# Patient Record
Sex: Male | Born: 1955
Health system: Southern US, Community
[De-identification: ages and names within clinical notes are randomized; demographics above are authoritative.]

## PROBLEM LIST (undated history)

## (undated) DIAGNOSIS — I700 Atherosclerosis of aorta: Secondary | ICD-10-CM

## (undated) DIAGNOSIS — I739 Peripheral vascular disease, unspecified: Secondary | ICD-10-CM

## (undated) DIAGNOSIS — M1990 Unspecified osteoarthritis, unspecified site: Secondary | ICD-10-CM

## (undated) DIAGNOSIS — E041 Nontoxic single thyroid nodule: Secondary | ICD-10-CM

## (undated) DIAGNOSIS — M5010 Cervical disc disorder with radiculopathy, unspecified cervical region: Secondary | ICD-10-CM

## (undated) DIAGNOSIS — I509 Heart failure, unspecified: Secondary | ICD-10-CM

## (undated) DIAGNOSIS — E538 Deficiency of other specified B group vitamins: Secondary | ICD-10-CM

## (undated) DIAGNOSIS — R053 Chronic cough: Secondary | ICD-10-CM

## (undated) DIAGNOSIS — N200 Calculus of kidney: Secondary | ICD-10-CM

## (undated) DIAGNOSIS — N289 Disorder of kidney and ureter, unspecified: Secondary | ICD-10-CM

## (undated) DIAGNOSIS — I2510 Atherosclerotic heart disease of native coronary artery without angina pectoris: Secondary | ICD-10-CM

## (undated) DIAGNOSIS — E785 Hyperlipidemia, unspecified: Secondary | ICD-10-CM

## (undated) DIAGNOSIS — A048 Other specified bacterial intestinal infections: Secondary | ICD-10-CM

## (undated) DIAGNOSIS — H9190 Unspecified hearing loss, unspecified ear: Secondary | ICD-10-CM

## (undated) DIAGNOSIS — C4490 Unspecified malignant neoplasm of skin, unspecified: Secondary | ICD-10-CM

## (undated) DIAGNOSIS — I6381 Other cerebral infarction due to occlusion or stenosis of small artery: Secondary | ICD-10-CM

## (undated) DIAGNOSIS — M5412 Radiculopathy, cervical region: Secondary | ICD-10-CM

## (undated) DIAGNOSIS — K635 Polyp of colon: Secondary | ICD-10-CM

## (undated) DIAGNOSIS — K219 Gastro-esophageal reflux disease without esophagitis: Secondary | ICD-10-CM

## (undated) DIAGNOSIS — I712 Thoracic aortic aneurysm, without rupture, unspecified: Secondary | ICD-10-CM

## (undated) DIAGNOSIS — M6208 Separation of muscle (nontraumatic), other site: Secondary | ICD-10-CM

## (undated) DIAGNOSIS — G4730 Sleep apnea, unspecified: Secondary | ICD-10-CM

## (undated) DIAGNOSIS — M549 Dorsalgia, unspecified: Secondary | ICD-10-CM

## (undated) DIAGNOSIS — I4819 Other persistent atrial fibrillation: Secondary | ICD-10-CM

## (undated) DIAGNOSIS — I10 Essential (primary) hypertension: Secondary | ICD-10-CM

## (undated) DIAGNOSIS — J449 Chronic obstructive pulmonary disease, unspecified: Secondary | ICD-10-CM

## (undated) DIAGNOSIS — E559 Vitamin D deficiency, unspecified: Secondary | ICD-10-CM

## (undated) DIAGNOSIS — R251 Tremor, unspecified: Secondary | ICD-10-CM

## (undated) DIAGNOSIS — G8929 Other chronic pain: Secondary | ICD-10-CM

## (undated) DIAGNOSIS — G609 Hereditary and idiopathic neuropathy, unspecified: Secondary | ICD-10-CM

## (undated) DIAGNOSIS — E119 Type 2 diabetes mellitus without complications: Secondary | ICD-10-CM

## (undated) DIAGNOSIS — Z8719 Personal history of other diseases of the digestive system: Secondary | ICD-10-CM

## (undated) DIAGNOSIS — G5600 Carpal tunnel syndrome, unspecified upper limb: Secondary | ICD-10-CM

## (undated) DIAGNOSIS — Z77098 Contact with and (suspected) exposure to other hazardous, chiefly nonmedicinal, chemicals: Secondary | ICD-10-CM

## (undated) DIAGNOSIS — R7881 Bacteremia: Secondary | ICD-10-CM

## (undated) DIAGNOSIS — Z87442 Personal history of urinary calculi: Secondary | ICD-10-CM

## (undated) DIAGNOSIS — I219 Acute myocardial infarction, unspecified: Secondary | ICD-10-CM

## (undated) DIAGNOSIS — I4891 Unspecified atrial fibrillation: Secondary | ICD-10-CM

## (undated) DIAGNOSIS — M51379 Other intervertebral disc degeneration, lumbosacral region without mention of lumbar back pain or lower extremity pain: Secondary | ICD-10-CM

## (undated) DIAGNOSIS — I209 Angina pectoris, unspecified: Secondary | ICD-10-CM

## (undated) DIAGNOSIS — M19011 Primary osteoarthritis, right shoulder: Secondary | ICD-10-CM

## (undated) DIAGNOSIS — F32A Depression, unspecified: Secondary | ICD-10-CM

## (undated) DIAGNOSIS — D3500 Benign neoplasm of unspecified adrenal gland: Secondary | ICD-10-CM

## (undated) DIAGNOSIS — I639 Cerebral infarction, unspecified: Secondary | ICD-10-CM

## (undated) DIAGNOSIS — K5790 Diverticulosis of intestine, part unspecified, without perforation or abscess without bleeding: Secondary | ICD-10-CM

## (undated) DIAGNOSIS — G5770 Causalgia of unspecified lower limb: Secondary | ICD-10-CM

## (undated) DIAGNOSIS — I5030 Unspecified diastolic (congestive) heart failure: Secondary | ICD-10-CM

## (undated) DIAGNOSIS — IMO0002 Reserved for concepts with insufficient information to code with codable children: Secondary | ICD-10-CM

## (undated) DIAGNOSIS — Z9889 Other specified postprocedural states: Secondary | ICD-10-CM

## (undated) DIAGNOSIS — R55 Syncope and collapse: Secondary | ICD-10-CM

## (undated) DIAGNOSIS — Z8673 Personal history of transient ischemic attack (TIA), and cerebral infarction without residual deficits: Secondary | ICD-10-CM

## (undated) DIAGNOSIS — F329 Major depressive disorder, single episode, unspecified: Secondary | ICD-10-CM

## (undated) DIAGNOSIS — Z7739 Contact with and (suspected) exposure to other war theater: Secondary | ICD-10-CM

## (undated) DIAGNOSIS — C801 Malignant (primary) neoplasm, unspecified: Secondary | ICD-10-CM

## (undated) DIAGNOSIS — B351 Tinea unguium: Secondary | ICD-10-CM

## (undated) DIAGNOSIS — Z8711 Personal history of peptic ulcer disease: Secondary | ICD-10-CM

## (undated) DIAGNOSIS — R05 Cough: Secondary | ICD-10-CM

## (undated) HISTORY — PX: CERVICAL FUSION: SHX112

## (undated) HISTORY — PX: CAROTID STENT: SHX1301

## (undated) HISTORY — DX: Syncope and collapse: R55

## (undated) HISTORY — DX: Reserved for concepts with insufficient information to code with codable children: IMO0002

## (undated) HISTORY — DX: Other cerebral infarction due to occlusion or stenosis of small artery: I63.81

## (undated) HISTORY — DX: Major depressive disorder, single episode, unspecified: F32.9

## (undated) HISTORY — PX: EYE SURGERY: SHX253

## (undated) HISTORY — DX: Unspecified diastolic (congestive) heart failure: I50.30

## (undated) HISTORY — DX: Depression, unspecified: F32.A

## (undated) HISTORY — DX: Other persistent atrial fibrillation: I48.19

## (undated) HISTORY — DX: Unspecified osteoarthritis, unspecified site: M19.90

## (undated) HISTORY — DX: Hyperlipidemia, unspecified: E78.5

## (undated) HISTORY — DX: Essential (primary) hypertension: I10

## (undated) HISTORY — DX: Atherosclerotic heart disease of native coronary artery without angina pectoris: I25.10

## (undated) HISTORY — DX: Peripheral vascular disease, unspecified: I73.9

## (undated) HISTORY — DX: Type 2 diabetes mellitus without complications: E11.9

## (undated) HISTORY — DX: Thoracic aortic aneurysm, without rupture, unspecified: I71.20

## (undated) HISTORY — DX: Unspecified malignant neoplasm of skin, unspecified: C44.90

## (undated) HISTORY — DX: Polyp of colon: K63.5

## (undated) HISTORY — DX: Calculus of kidney: N20.0

## (undated) HISTORY — PX: BLEPHAROPLASTY: SUR158

## (undated) HISTORY — DX: Thoracic aortic aneurysm, without rupture: I71.2

## (undated) HISTORY — PX: BACK SURGERY: SHX140

## (undated) HISTORY — DX: Other specified postprocedural states: Z98.890

---

## 1988-01-13 HISTORY — PX: CARPAL TUNNEL RELEASE: SHX101

## 1997-01-12 DIAGNOSIS — Z9889 Other specified postprocedural states: Secondary | ICD-10-CM

## 1997-01-12 HISTORY — DX: Other specified postprocedural states: Z98.890

## 1998-01-12 HISTORY — PX: CHOLECYSTECTOMY: SHX55

## 2005-01-12 HISTORY — PX: TUMOR EXCISION: SHX421

## 2005-01-12 LAB — HM COLONOSCOPY

## 2006-07-31 ENCOUNTER — Other Ambulatory Visit: Payer: Self-pay

## 2006-07-31 ENCOUNTER — Emergency Department: Payer: Self-pay | Admitting: Emergency Medicine

## 2010-01-12 HISTORY — PX: SHOULDER ARTHROSCOPY: SHX128

## 2012-01-13 HISTORY — PX: CORONARY ANGIOPLASTY WITH STENT PLACEMENT: SHX49

## 2012-04-28 LAB — HM DIABETES EYE EXAM

## 2012-11-12 LAB — HM DIABETES FOOT EXAM

## 2012-12-14 DIAGNOSIS — S065XAA Traumatic subdural hemorrhage with loss of consciousness status unknown, initial encounter: Secondary | ICD-10-CM

## 2012-12-14 DIAGNOSIS — Z8679 Personal history of other diseases of the circulatory system: Secondary | ICD-10-CM

## 2012-12-14 HISTORY — DX: Personal history of other diseases of the circulatory system: Z86.79

## 2012-12-14 HISTORY — DX: Traumatic subdural hemorrhage with loss of consciousness status unknown, initial encounter: S06.5XAA

## 2012-12-15 DIAGNOSIS — S22009A Unspecified fracture of unspecified thoracic vertebra, initial encounter for closed fracture: Secondary | ICD-10-CM | POA: Insufficient documentation

## 2012-12-15 DIAGNOSIS — M549 Dorsalgia, unspecified: Secondary | ICD-10-CM | POA: Insufficient documentation

## 2013-04-14 LAB — HM DIABETES EYE EXAM

## 2013-04-28 ENCOUNTER — Encounter: Payer: Self-pay | Admitting: Internal Medicine

## 2013-04-28 ENCOUNTER — Other Ambulatory Visit: Payer: Self-pay | Admitting: Internal Medicine

## 2013-04-28 ENCOUNTER — Telehealth: Payer: Self-pay | Admitting: *Deleted

## 2013-04-28 ENCOUNTER — Ambulatory Visit (INDEPENDENT_AMBULATORY_CARE_PROVIDER_SITE_OTHER): Payer: Medicare Other | Admitting: Internal Medicine

## 2013-04-28 ENCOUNTER — Encounter (INDEPENDENT_AMBULATORY_CARE_PROVIDER_SITE_OTHER): Payer: Self-pay

## 2013-04-28 ENCOUNTER — Ambulatory Visit (INDEPENDENT_AMBULATORY_CARE_PROVIDER_SITE_OTHER)
Admission: RE | Admit: 2013-04-28 | Discharge: 2013-04-28 | Disposition: A | Discharge status: Home / Self Care | Payer: Medicare Other | Source: Ambulatory Visit | Attending: Internal Medicine | Admitting: Internal Medicine

## 2013-04-28 ENCOUNTER — Encounter: Payer: Self-pay | Admitting: *Deleted

## 2013-04-28 VITALS — BP 108/60 | HR 76 | Temp 98.2°F | Resp 14 | Ht 70.5 in | Wt 235.8 lb

## 2013-04-28 DIAGNOSIS — E782 Mixed hyperlipidemia: Secondary | ICD-10-CM | POA: Insufficient documentation

## 2013-04-28 DIAGNOSIS — R0789 Other chest pain: Secondary | ICD-10-CM

## 2013-04-28 DIAGNOSIS — J189 Pneumonia, unspecified organism: Secondary | ICD-10-CM

## 2013-04-28 DIAGNOSIS — R071 Chest pain on breathing: Secondary | ICD-10-CM

## 2013-04-28 DIAGNOSIS — Z23 Encounter for immunization: Secondary | ICD-10-CM

## 2013-04-28 DIAGNOSIS — E785 Hyperlipidemia, unspecified: Secondary | ICD-10-CM

## 2013-04-28 DIAGNOSIS — I6200 Nontraumatic subdural hemorrhage, unspecified: Secondary | ICD-10-CM

## 2013-04-28 DIAGNOSIS — S065XAA Traumatic subdural hemorrhage with loss of consciousness status unknown, initial encounter: Secondary | ICD-10-CM

## 2013-04-28 DIAGNOSIS — I10 Essential (primary) hypertension: Secondary | ICD-10-CM

## 2013-04-28 DIAGNOSIS — S22009A Unspecified fracture of unspecified thoracic vertebra, initial encounter for closed fracture: Secondary | ICD-10-CM

## 2013-04-28 DIAGNOSIS — E119 Type 2 diabetes mellitus without complications: Secondary | ICD-10-CM

## 2013-04-28 DIAGNOSIS — S065X9A Traumatic subdural hemorrhage with loss of consciousness of unspecified duration, initial encounter: Secondary | ICD-10-CM

## 2013-04-28 DIAGNOSIS — I2510 Atherosclerotic heart disease of native coronary artery without angina pectoris: Secondary | ICD-10-CM

## 2013-04-28 DIAGNOSIS — M549 Dorsalgia, unspecified: Secondary | ICD-10-CM

## 2013-04-28 LAB — COMPREHENSIVE METABOLIC PANEL
ALBUMIN: 3.9 g/dL (ref 3.5–5.2)
ALK PHOS: 60 U/L (ref 39–117)
ALT: 21 U/L (ref 0–53)
AST: 20 U/L (ref 0–37)
BUN: 15 mg/dL (ref 6–23)
CO2: 31 mEq/L (ref 19–32)
CREATININE: 0.9 mg/dL (ref 0.4–1.5)
Calcium: 9.2 mg/dL (ref 8.4–10.5)
Chloride: 99 mEq/L (ref 96–112)
GFR: 97.9 mL/min (ref >60.00)
Glucose, Bld: 165 mg/dL — ABNORMAL HIGH (ref 70–99)
POTASSIUM: 3.7 meq/L (ref 3.5–5.1)
Sodium: 138 mEq/L (ref 135–145)
Total Bilirubin: 0.8 mg/dL (ref 0.3–1.2)
Total Protein: 6.9 g/dL (ref 6.0–8.3)

## 2013-04-28 LAB — HEMOGLOBIN A1C: Hgb A1c MFr Bld: 6.2 % (ref 4.6–6.5)

## 2013-04-28 LAB — CBC WITH DIFFERENTIAL/PLATELET
Basophils Absolute: 0 10*3/uL (ref 0.0–0.1)
Basophils Relative: 0.4 % (ref 0.0–3.0)
EOS PCT: 1.9 % (ref 0.0–5.0)
Eosinophils Absolute: 0.1 10*3/uL (ref 0.0–0.7)
HCT: 39.4 % (ref 39.0–52.0)
Hemoglobin: 13.1 g/dL (ref 13.0–17.0)
LYMPHS PCT: 14 % (ref 12.0–46.0)
Lymphs Abs: 1 10*3/uL (ref 0.7–4.0)
MCHC: 33.3 g/dL (ref 30.0–36.0)
MCV: 81.3 fl (ref 78.0–100.0)
Monocytes Absolute: 0.5 10*3/uL (ref 0.1–1.0)
Monocytes Relative: 7.4 % (ref 3.0–12.0)
NEUTROS PCT: 76.3 % (ref 43.0–77.0)
Neutro Abs: 5.6 10*3/uL (ref 1.4–7.7)
Platelets: 212 10*3/uL (ref 150.0–400.0)
RBC: 4.84 Mil/uL (ref 4.22–5.81)
RDW: 15.6 % — ABNORMAL HIGH (ref 11.5–14.6)
WBC: 7.4 10*3/uL (ref 4.5–10.5)

## 2013-04-28 LAB — LIPID PANEL
CHOLESTEROL: 144 mg/dL (ref 0–200)
HDL: 32.3 mg/dL — ABNORMAL LOW (ref >39.00)
LDL CALC: 27 mg/dL (ref 0–99)
TRIGLYCERIDES: 425 mg/dL — AB (ref 0.0–149.0)
Total CHOL/HDL Ratio: 4
VLDL: 85 mg/dL — AB (ref 0.0–40.0)

## 2013-04-28 LAB — MICROALBUMIN / CREATININE URINE RATIO
Creatinine,U: 164.3 mg/dL
Microalb Creat Ratio: 1.2 mg/g (ref 0.0–30.0)
Microalb, Ur: 2 mg/dL — ABNORMAL HIGH (ref 0.0–1.9)

## 2013-04-28 MED ORDER — LEVOFLOXACIN 500 MG PO TABS: 500.0000 mg | ORAL_TABLET | Freq: Every day | ORAL | Status: DC

## 2013-04-28 MED ORDER — LIDOCAINE 5 % EX PTCH: 1.0000 | MEDICATED_PATCH | CUTANEOUS | Status: DC

## 2013-04-28 NOTE — Telephone Encounter (Signed)
Advised pt's wife that he may return to [facility] office in 4 weeks for repeat chest x ray. Order needs to be entered.

## 2013-04-28 NOTE — Assessment & Plan Note (Signed)
Several weeks right chest wall pain. Tenderness to palpation over rib cage is suggestive of pain secondary to previous trauma with possible nerve impingement. Chest x-ray today shows infiltrate in the right middle lobe. He does not have symptoms of cough or fever to suggest pneumonia, however we'll err on the side of caution and start Levaquin 500 mg daily. Plan to repeat chest x-ray in 2-4 weeks. If unclear on follow up CXR, will consider repeat chest CT. Will also start topical Lidoderm patch to help control pain symptoms.

## 2013-04-28 NOTE — Assessment & Plan Note (Signed)
BP Readings from Last 3 Encounters:  04/28/13 108/60   Blood pressure well-controlled on current medicines. Will continue.

## 2013-04-28 NOTE — Assessment & Plan Note (Signed)
  Lab Results  Component Value Date   LDLCALC 27 04/28/2013   LDL well controlled on atorvastatin. Persistent hypertriglyceridemia with triglycerides greater than 400 today. Continue fenofibrate. Encouraged healthy, high fiber diet and exercise as tolerated.

## 2013-04-28 NOTE — Assessment & Plan Note (Signed)
Lab Results  Component Value Date   HGBA1C 6.2 04/28/2013   BG well controlled on Metformin alone. Will continue.

## 2013-04-28 NOTE — Telephone Encounter (Signed)
Pt's wife, [name], left message, asking date and time of repeat chest x ray.

## 2013-04-28 NOTE — Assessment & Plan Note (Signed)
Chronic back pain in both lumbar and cervical spine secondary to degenerative joint disease. Followed at the [facility]. will continue tramadol and oxycodone as needed.

## 2013-04-28 NOTE — Assessment & Plan Note (Signed)
Recent history of intracranial subdural hematoma. Reviewed notes from [place] [facility] today. Patient is symptomatically doing well and has tolerated going back on Ticagrelor.

## 2013-04-28 NOTE — Progress Notes (Signed)
Subjective:    Patient ID: Walter Salas, male    DOB: 11-25-1955, 58 y.o.   MRN: [id number]  HPI 58YO male presents to establish care.  DM - He reports that BG have been stable on Metformin. He is compliant with medication. BG mostly near 130.  CAD - s/p 2 stents to RCA and 2 stents to LCirc at [facility]. On Ticagrelor, Atorvastatin, Fenofibrate, Lisinopril, Metoprolol. Compliant with medications. No recent chest pain, dyspnea.  Long history of spine issues, s/p cervical fusion surgery in 2000 and multiple laminectomy/fusion procedures to lumbar spine. Followed at [place] for chronic pain management.  Injury 11/2012 after fall from [place] in subdural hematoma, T7 and T8 fractures, and multiple rib fractures. He was doing well until last few weeks, when he developed worsening right chest wall pain. Pain is made worse with pressure applied to area, deep inspiration. No dyspnea, no cough noted. No fever.  Review of Systems  Constitutional: Negative for fever, chills, activity change, appetite change, fatigue and unexpected weight change.  HENT: Negative for congestion, postnasal drip, rhinorrhea, sinus pressure, sneezing and sore throat.   Eyes: Negative for visual disturbance.  Respiratory: Negative for cough and shortness of breath.   Cardiovascular: Positive for chest pain. Negative for palpitations and leg swelling.  Gastrointestinal: Negative for abdominal pain and abdominal distention.  Genitourinary: Negative for dysuria, urgency and difficulty urinating.  Musculoskeletal: Positive for arthralgias, back pain, myalgias and neck pain. Negative for gait problem.  Skin: Negative for color change and rash.  Hematological: Negative for adenopathy.  Psychiatric/Behavioral: Negative for sleep disturbance and dysphoric mood. The patient is not nervous/anxious.        Objective:    BP 108/60  Pulse 76  Temp(Src) 98.2 F (36.8 C) (Oral)  Resp 14  Ht 5' 10.5" (1.791  m)  Wt 235 lb 12.8 oz (106.958 kg)  BMI 33.34 kg/m2  SpO2 95% Physical Exam  Constitutional: He is oriented to person, place, and time. He appears well-developed and well-nourished. No distress.  HENT:  Head: Normocephalic and atraumatic.  Right Ear: External ear normal.  Left Ear: External ear normal.  Nose: Nose normal.  Mouth/Throat: Oropharynx is clear and moist. No oropharyngeal exudate.  Eyes: Conjunctivae and EOM are normal. Pupils are equal, round, and reactive to light. Right eye exhibits no discharge. Left eye exhibits no discharge. No scleral icterus.  Neck: Normal range of motion. Neck supple. No tracheal deviation present. No thyromegaly present.  Cardiovascular: Normal rate, regular rhythm and normal heart sounds.  Exam reveals no gallop and no friction rub.   No murmur heard. Pulmonary/Chest: Effort normal and breath sounds normal. No accessory muscle usage. Not tachypneic. No respiratory distress. He has no decreased breath sounds. He has no wheezes. He has no rhonchi. He has no rales. He exhibits tenderness. He exhibits no crepitus, no edema and no deformity.    Abdominal: Soft. Bowel sounds are normal. He exhibits no distension and no mass. There is no tenderness. There is no rebound and no guarding.  Musculoskeletal: Normal range of motion. He exhibits no edema.  Lymphadenopathy:    He has no cervical adenopathy.  Neurological: He is alert and oriented to person, place, and time. No cranial nerve deficit. Coordination normal.  Skin: Skin is warm and dry. No rash noted. He is not diaphoretic. No erythema. No pallor.  Psychiatric: He has a normal mood and affect. His behavior is normal. Judgment and thought content normal.  Assessment & Plan:   Problem List Items Addressed This Visit   Back ache     Chronic back pain in both lumbar and cervical spine secondary to degenerative joint disease. Followed at the [facility]. will continue tramadol and oxycodone as  needed.    Relevant Medications      oxyCODONE-acetaminophen (PERCOCET/ROXICET) 5-325 MG per tablet      traMADol (ULTRAM) 50 MG tablet   Chest wall pain     Several weeks right chest wall pain. Tenderness to palpation over rib cage is suggestive of pain secondary to previous trauma with possible nerve impingement. Chest x-ray today shows infiltrate in the right middle lobe. He does not have symptoms of cough or fever to suggest pneumonia, however we'll err on the side of caution and start Levaquin 500 mg daily. Plan to repeat chest x-ray in 2-4 weeks. If unclear on follow up CXR, will consider repeat chest CT. Will also start topical Lidoderm patch to help control pain symptoms.    Relevant Medications      LIDODERM 5 % EX PTCH   Other Relevant Orders      DG Chest 2 View (Completed)   Coronary artery disease     Symptomatically doing well with no recent episodes of chest pain. We'll continue current medications including metoprolol, lisinopril, atorvastatin, fenofibrate, and Ticagrelor.    Relevant Medications      lisinopril (PRINIVIL,ZESTRIL) 40 MG tablet      metoprolol (LOPRESSOR) 50 MG tablet      nitroGLYCERIN (NITROSTAT) 0.4 MG SL tablet      atorvastatin (LIPITOR) 80 MG tablet      fenofibrate (TRICOR) 145 MG tablet   Diabetes mellitus, type 2 - Primary      Lab Results  Component Value Date   HGBA1C 6.2 04/28/2013   BG well controlled on Metformin alone. Will continue.    Relevant Medications      lisinopril (PRINIVIL,ZESTRIL) 40 MG tablet      metFORMIN (GLUCOPHAGE) 1000 MG tablet      atorvastatin (LIPITOR) 80 MG tablet   Other Relevant Orders      Comprehensive metabolic panel (Completed)      Hemoglobin A1c (Completed)      Lipid panel (Completed)      Microalbumin / creatinine urine ratio (Completed)      CBC with Differential (Completed)      POCT Urinalysis Dipstick   Dorsal (thoracic) vertebral fracture     Chronic pain after vertebral fracture. Reviewed  notes from [facility] today. We'll continue current medications.    Essential hypertension, benign      BP Readings from Last 3 Encounters:  04/28/13 108/60   Blood pressure well-controlled on current medicines. Will continue.    Relevant Medications      lisinopril (PRINIVIL,ZESTRIL) 40 MG tablet      metoprolol (LOPRESSOR) 50 MG tablet      nitroGLYCERIN (NITROSTAT) 0.4 MG SL tablet      atorvastatin (LIPITOR) 80 MG tablet      fenofibrate (TRICOR) 145 MG tablet   Hyperlipidemia       Lab Results  Component Value Date   LDLCALC 27 04/28/2013   LDL well controlled on atorvastatin. Persistent hypertriglyceridemia with triglycerides greater than 400 today. Continue fenofibrate. Encouraged healthy, high fiber diet and exercise as tolerated.    Relevant Medications      lisinopril (PRINIVIL,ZESTRIL) 40 MG tablet      metoprolol (LOPRESSOR) 50 MG tablet  nitroGLYCERIN (NITROSTAT) 0.4 MG SL tablet      atorvastatin (LIPITOR) 80 MG tablet      fenofibrate (TRICOR) 145 MG tablet   Intracranial subdural hematoma     Recent history of intracranial subdural hematoma. Reviewed notes from [place] [facility] today. Patient is symptomatically doing well and has tolerated going back on Ticagrelor.     Other Visit Diagnoses   Need for Tdap vaccination        Relevant Orders       Tdap vaccine greater than or equal to 7yo IM (Completed)    Need for 23-polyvalent pneumococcal polysaccharide vaccine        Relevant Orders       Pneumococcal polysaccharide vaccine 23-valent greater than or equal to 2yo subcutaneous/IM (Completed)        Return in about 4 weeks (around 05/26/2013).

## 2013-04-28 NOTE — Assessment & Plan Note (Signed)
Symptomatically doing well with no recent episodes of chest pain. We'll continue current medications including metoprolol, lisinopril, atorvastatin, fenofibrate, and Ticagrelor.

## 2013-04-28 NOTE — Progress Notes (Signed)
Pre visit review using our clinic review tool, if applicable. No additional management support is needed unless otherwise documented below in the visit note. 

## 2013-04-28 NOTE — Assessment & Plan Note (Signed)
Chronic pain after vertebral fracture. Reviewed notes from [facility] today. We'll continue current medications.

## 2013-05-26 ENCOUNTER — Ambulatory Visit (INDEPENDENT_AMBULATORY_CARE_PROVIDER_SITE_OTHER)
Admission: RE | Admit: 2013-05-26 | Discharge: 2013-05-26 | Disposition: A | Discharge status: Home / Self Care | Payer: Medicare Other | Source: Ambulatory Visit | Attending: Internal Medicine | Admitting: Internal Medicine

## 2013-05-26 DIAGNOSIS — J189 Pneumonia, unspecified organism: Secondary | ICD-10-CM

## 2013-05-30 ENCOUNTER — Ambulatory Visit: Payer: Medicare Other | Admitting: Internal Medicine

## 2013-06-07 ENCOUNTER — Ambulatory Visit (INDEPENDENT_AMBULATORY_CARE_PROVIDER_SITE_OTHER): Payer: Medicare Other | Admitting: Internal Medicine

## 2013-06-07 ENCOUNTER — Encounter (INDEPENDENT_AMBULATORY_CARE_PROVIDER_SITE_OTHER): Payer: Self-pay

## 2013-06-07 ENCOUNTER — Encounter: Payer: Self-pay | Admitting: Internal Medicine

## 2013-06-07 VITALS — BP 100/58 | HR 74 | Temp 98.1°F | Ht 70.5 in | Wt 230.5 lb

## 2013-06-07 DIAGNOSIS — M722 Plantar fascial fibromatosis: Secondary | ICD-10-CM

## 2013-06-07 DIAGNOSIS — R0789 Other chest pain: Secondary | ICD-10-CM

## 2013-06-07 DIAGNOSIS — R071 Chest pain on breathing: Secondary | ICD-10-CM

## 2013-06-07 NOTE — Progress Notes (Signed)
Pre visit review using our clinic review tool, if applicable. No additional management support is needed unless otherwise documented below in the visit note. 

## 2013-06-07 NOTE — Progress Notes (Signed)
Subjective:    Patient ID: Walter Salas, male    DOB: [birth year], 58 y.o.   MRN: [id number]  HPI 58YO male presents to follow up chest wall pain.  Chest wall pain - Continues to have constant pain right chest wall. Not taking anything for this, except for tramadol which is used primarily for joint pain. Some improvement with this. Recent CXR was normal. [facility] Minimal improvement with lidoderm.  Left heel pain - 2-3 weeks. Stabbing. Worse when first stands to walk.Taking Tramadol with no improvement. Pain worsens with persistent weight bearing. No h/o trauma. NO swelling. No weakness  Review of Systems  Constitutional: Negative for fever, chills, activity change, appetite change, fatigue and unexpected weight change.  Eyes: Negative for visual disturbance.  Respiratory: Negative for cough and shortness of breath.   Cardiovascular: Positive for chest pain (right lateral chest wall). Negative for palpitations and leg swelling.  Gastrointestinal: Negative for abdominal pain and abdominal distention.  Genitourinary: Negative for dysuria, urgency and difficulty urinating.  Musculoskeletal: Positive for arthralgias (left heel). Negative for gait problem.  Skin: Negative for color change and rash.  Hematological: Negative for adenopathy.  Psychiatric/Behavioral: Negative for sleep disturbance and dysphoric mood. The patient is not nervous/anxious.        Objective:    BP 100/58  Pulse 74  Temp(Src) 98.1 F (36.7 C) (Oral)  Ht 5' 10.5" (1.791 m)  Wt 230 lb 8 oz (104.554 kg)  BMI 32.59 kg/m2  SpO2 96% Physical Exam  Constitutional: He is oriented to person, place, and time. He appears well-developed and well-nourished. No distress.  HENT:  Head: Normocephalic and atraumatic.  Right Ear: External ear normal.  Left Ear: External ear normal.  Nose: Nose normal.  Mouth/Throat: Oropharynx is clear and moist. No oropharyngeal exudate.  Eyes:  Conjunctivae and EOM are normal. Pupils are equal, round, and reactive to light. Right eye exhibits no discharge. Left eye exhibits no discharge. No scleral icterus.  Neck: Normal range of motion. Neck supple. No tracheal deviation present. No thyromegaly present.  Cardiovascular: Normal rate, regular rhythm and normal heart sounds.  Exam reveals no gallop and no friction rub.   No murmur heard. Pulmonary/Chest: Effort normal and breath sounds normal. No accessory muscle usage. Not tachypneic. No respiratory distress. He has no decreased breath sounds. He has no wheezes. He has no rhonchi. He has no rales. He exhibits no tenderness.  Musculoskeletal: Normal range of motion. He exhibits no edema.       Left foot: He exhibits tenderness (Plantar surface of heel and lateral heel). He exhibits normal range of motion, no bony tenderness, no swelling and no deformity.  Lymphadenopathy:    He has no cervical adenopathy.  Neurological: He is alert and oriented to person, place, and time. No cranial nerve deficit. Coordination normal.  Skin: Skin is warm and dry. No rash noted. He is not diaphoretic. No erythema. No pallor.  Psychiatric: He has a normal mood and affect. His behavior is normal. Judgment and thought content normal.          Assessment & Plan:   Problem List Items Addressed This Visit     Unprioritized   Chest wall pain - Primary     Suspect nerve impingement after previous trauma. Recent CXR normal. Discussed referral to pain management for further evaluation and possible injection, however he would prefer to do this through the [place]. Follow up prn.    Plantar  fasciitis of left foot     Symptoms and exam consistent with left plantar fasciitis. Discussed icing the area. Given no improvement with Tramadol (not a candidate for NSAIDs), will set up podiatry referral for possible steroid injection.    Relevant Orders      Ambulatory referral to Podiatry       Return in about 2  months (around 08/07/2013) for Recheck of Diabetes.

## 2013-06-07 NOTE — Assessment & Plan Note (Signed)
Suspect nerve impingement after previous trauma. Recent CXR normal. Discussed referral to pain management for further evaluation and possible injection, however he would prefer to do this through the [place]. Follow up prn.

## 2013-06-07 NOTE — Patient Instructions (Signed)

## 2013-06-07 NOTE — Assessment & Plan Note (Signed)
Symptoms and exam consistent with left plantar fasciitis. Discussed icing the area. Given no improvement with Tramadol (not a candidate for NSAIDs), will set up podiatry referral for possible steroid injection.

## 2013-06-14 LAB — HM DIABETES FOOT EXAM

## 2013-08-14 ENCOUNTER — Ambulatory Visit (INDEPENDENT_AMBULATORY_CARE_PROVIDER_SITE_OTHER): Payer: Medicare Other | Admitting: Internal Medicine

## 2013-08-14 ENCOUNTER — Telehealth: Payer: Self-pay | Admitting: Internal Medicine

## 2013-08-14 ENCOUNTER — Encounter: Payer: Self-pay | Admitting: Internal Medicine

## 2013-08-14 VITALS — BP 98/62 | HR 70 | Temp 98.2°F | Ht 70.5 in | Wt 227.8 lb

## 2013-08-14 DIAGNOSIS — E119 Type 2 diabetes mellitus without complications: Secondary | ICD-10-CM

## 2013-08-14 DIAGNOSIS — E559 Vitamin D deficiency, unspecified: Secondary | ICD-10-CM | POA: Insufficient documentation

## 2013-08-14 DIAGNOSIS — E785 Hyperlipidemia, unspecified: Secondary | ICD-10-CM

## 2013-08-14 DIAGNOSIS — I10 Essential (primary) hypertension: Secondary | ICD-10-CM

## 2013-08-14 DIAGNOSIS — G609 Hereditary and idiopathic neuropathy, unspecified: Secondary | ICD-10-CM

## 2013-08-14 LAB — LDL CHOLESTEROL, DIRECT: Direct LDL: 78.9 mg/dL

## 2013-08-14 LAB — LIPID PANEL
CHOL/HDL RATIO: 5
Cholesterol: 136 mg/dL (ref 0–200)
HDL: 27.5 mg/dL — ABNORMAL LOW (ref >39.00)
NONHDL: 108.5
Triglycerides: 260 mg/dL — ABNORMAL HIGH (ref 0.0–149.0)
VLDL: 52 mg/dL — ABNORMAL HIGH (ref 0.0–40.0)

## 2013-08-14 LAB — COMPREHENSIVE METABOLIC PANEL
ALT: 20 U/L (ref 0–53)
AST: 21 U/L (ref 0–37)
Albumin: 4.2 g/dL (ref 3.5–5.2)
Alkaline Phosphatase: 48 U/L (ref 39–117)
BUN: 14 mg/dL (ref 6–23)
CHLORIDE: 96 meq/L (ref 96–112)
CO2: 31 mEq/L (ref 19–32)
Calcium: 9.2 mg/dL (ref 8.4–10.5)
Creatinine, Ser: 0.9 mg/dL (ref 0.4–1.5)
GFR: 93.96 mL/min (ref >60.00)
Glucose, Bld: 141 mg/dL — ABNORMAL HIGH (ref 70–99)
POTASSIUM: 3.6 meq/L (ref 3.5–5.1)
SODIUM: 133 meq/L — AB (ref 135–145)
TOTAL PROTEIN: 7.3 g/dL (ref 6.0–8.3)
Total Bilirubin: 0.7 mg/dL (ref 0.2–1.2)

## 2013-08-14 LAB — MICROALBUMIN / CREATININE URINE RATIO
Creatinine,U: 151.8 mg/dL
MICROALB UR: 5.7 mg/dL — AB (ref 0.0–1.9)
Microalb Creat Ratio: 3.8 mg/g (ref 0.0–30.0)

## 2013-08-14 LAB — HEMOGLOBIN A1C: Hgb A1c MFr Bld: 6.7 % — ABNORMAL HIGH (ref 4.6–6.5)

## 2013-08-14 LAB — VITAMIN D 25 HYDROXY (VIT D DEFICIENCY, FRACTURES): VITD: 36.61 ng/mL (ref 30.00–100.00)

## 2013-08-14 LAB — VITAMIN B12: Vitamin B-12: 567 pg/mL (ref 211–911)

## 2013-08-14 NOTE — Telephone Encounter (Signed)
Relevant patient education mailed to patient.  

## 2013-08-14 NOTE — Assessment & Plan Note (Signed)
Will check A1c with labs today. Continue metformin.

## 2013-08-14 NOTE — Assessment & Plan Note (Signed)
Will check lipids with labs. Continue Atorvastatin. 

## 2013-08-14 NOTE — Assessment & Plan Note (Signed)
Symptoms poorly controlled. Discussed possible increase in dose of Lyrica, however he does not wish to increase dose at this time because of side effects. Will check A1c and B12 level with labs today.

## 2013-08-14 NOTE — Progress Notes (Signed)
Subjective:    Patient ID: Walter Salas, male    DOB: 10/20/1955, 58 y.o.   MRN: [id number]  HPI 58YO male presents for follow up.  DM - BG have been well controlled by pt report. Compliant with medication.  Recently seen by podiatry for plantar fasciitis. Had steroid injection with some improvement. However, continues to feel like he has a chronic "toothache" in both feet. Taking Lyrica with some improvement. Does not wish to escalate dose because of potential for side effects.  He notes he was recently treated for Vit D deficiency at the [facility]. No recent levels checked. Taking 4000units daily of Vit D.   Review of Systems  Constitutional: Negative for fever, chills, activity change, appetite change, fatigue and unexpected weight change.  Eyes: Negative for visual disturbance.  Respiratory: Negative for cough and shortness of breath.   Cardiovascular: Positive for chest pain (chronic chest wall pain). Negative for palpitations and leg swelling.  Gastrointestinal: Negative for abdominal pain and abdominal distention.  Genitourinary: Negative for dysuria, urgency and difficulty urinating.  Musculoskeletal: Positive for arthralgias and myalgias. Negative for gait problem.  Skin: Negative for color change and rash.  Neurological: Positive for numbness (feet bilaterally). Negative for weakness.  Hematological: Negative for adenopathy.  Psychiatric/Behavioral: Negative for sleep disturbance and dysphoric mood. The patient is not nervous/anxious.        Objective:    BP 98/62  Pulse 70  Temp(Src) 98.2 F (36.8 C) (Oral)  Ht 5' 10.5" (1.791 m)  Wt 227 lb 12 oz (103.307 kg)  BMI 32.21 kg/m2  SpO2 93% Physical Exam  Constitutional: He is oriented to person, place, and time. He appears well-developed and well-nourished. No distress.  HENT:  Head: Normocephalic and atraumatic.  Right Ear: External ear normal.  Left Ear: External ear normal.  Nose: Nose normal.    Mouth/Throat: Oropharynx is clear and moist. No oropharyngeal exudate.  Eyes: Conjunctivae and EOM are normal. Pupils are equal, round, and reactive to light. Right eye exhibits no discharge. Left eye exhibits no discharge. No scleral icterus.  Neck: Normal range of motion. Neck supple. No tracheal deviation present. No thyromegaly present.  Cardiovascular: Normal rate, regular rhythm and normal heart sounds.  Exam reveals no gallop and no friction rub.   No murmur heard. Pulmonary/Chest: Effort normal and breath sounds normal. No accessory muscle usage. Not tachypneic. No respiratory distress. He has no decreased breath sounds. He has no wheezes. He has no rhonchi. He has no rales. He exhibits no tenderness.  Musculoskeletal: Normal range of motion. He exhibits no edema.  Lymphadenopathy:    He has no cervical adenopathy.  Neurological: He is alert and oriented to person, place, and time. No cranial nerve deficit. Coordination normal.  Skin: Skin is warm and dry. No rash noted. He is not diaphoretic. No erythema. No pallor.  Psychiatric: He has a normal mood and affect. His behavior is normal. Judgment and thought content normal.          Assessment & Plan:   Problem List Items Addressed This Visit     Unprioritized   Diabetes mellitus, type 2 - Primary     Will check A1c with labs today. Continue metformin.    Relevant Orders      Comprehensive metabolic panel      Hemoglobin A1c      Microalbumin / creatinine urine ratio   Essential hypertension, benign      BP Readings from Last 3 Encounters:  08/14/13 98/62  06/07/13 100/58  04/28/13 108/60   BP well controlled on current medication. Will check renal function with labs.    Hyperlipidemia     Will check lipids with labs. Continue Atorvastatin.    Relevant Orders      Lipid panel   Unspecified hereditary and idiopathic peripheral neuropathy     Symptoms poorly controlled. Discussed possible increase in dose of  Lyrica, however he does not wish to increase dose at this time because of side effects. Will check A1c and B12 level with labs today.    Relevant Orders      B12   Unspecified vitamin D deficiency     Will check Vit D level with labs.    Relevant Orders      Vitamin D (25 hydroxy)       Return in about 3 months (around 11/14/2013).

## 2013-08-14 NOTE — Progress Notes (Signed)
Pre visit review using our clinic review tool, if applicable. No additional management support is needed unless otherwise documented below in the visit note. 

## 2013-08-14 NOTE — Patient Instructions (Signed)
Labs today.   Follow up in 3 months.  

## 2013-08-14 NOTE — Assessment & Plan Note (Signed)
BP Readings from Last 3 Encounters:  08/14/13 98/62  06/07/13 100/58  04/28/13 108/60   BP well controlled on current medication. Will check renal function with labs.

## 2013-08-14 NOTE — Assessment & Plan Note (Signed)
Will check Vit D level with labs.

## 2013-08-15 ENCOUNTER — Telehealth: Payer: Self-pay | Admitting: Internal Medicine

## 2013-08-15 ENCOUNTER — Encounter: Payer: Self-pay | Admitting: *Deleted

## 2013-08-15 NOTE — Telephone Encounter (Signed)
Relevant patient education assigned to patient using Emmi. ° °

## 2013-10-05 ENCOUNTER — Telehealth: Payer: Self-pay | Admitting: Internal Medicine

## 2013-10-05 ENCOUNTER — Encounter: Payer: Self-pay | Admitting: Internal Medicine

## 2013-10-05 ENCOUNTER — Ambulatory Visit (INDEPENDENT_AMBULATORY_CARE_PROVIDER_SITE_OTHER): Payer: Medicare Other | Admitting: Internal Medicine

## 2013-10-05 VITALS — BP 124/66 | HR 87 | Temp 98.3°F | Wt 235.0 lb

## 2013-10-05 DIAGNOSIS — R361 Hematospermia: Secondary | ICD-10-CM

## 2013-10-05 DIAGNOSIS — R300 Dysuria: Secondary | ICD-10-CM

## 2013-10-05 LAB — CBC
HCT: 39.3 % (ref 39.0–52.0)
HEMOGLOBIN: 13 g/dL (ref 13.0–17.0)
MCHC: 33 g/dL (ref 30.0–36.0)
MCV: 82.9 fl (ref 78.0–100.0)
PLATELETS: 184 10*3/uL (ref 150.0–400.0)
RBC: 4.73 Mil/uL (ref 4.22–5.81)
RDW: 15.4 % (ref 11.5–15.5)
WBC: 6.4 10*3/uL (ref 4.0–10.5)

## 2013-10-05 LAB — POCT URINALYSIS DIPSTICK
Bilirubin, UA: NEGATIVE
Blood, UA: NEGATIVE
Glucose, UA: NEGATIVE
KETONES UA: NEGATIVE
Leukocytes, UA: NEGATIVE
Nitrite, UA: NEGATIVE
Protein, UA: NEGATIVE
Spec Grav, UA: >=1.03
Urobilinogen, UA: NEGATIVE
pH, UA: 6

## 2013-10-05 LAB — BASIC METABOLIC PANEL
BUN: 14 mg/dL (ref 6–23)
CALCIUM: 9.3 mg/dL (ref 8.4–10.5)
CO2: 30 meq/L (ref 19–32)
Chloride: 97 mEq/L (ref 96–112)
Creatinine, Ser: 0.9 mg/dL (ref 0.4–1.5)
GFR: 95.16 mL/min (ref >60.00)
GLUCOSE: 194 mg/dL — AB (ref 70–99)
Potassium: 3.7 mEq/L (ref 3.5–5.1)
Sodium: 134 mEq/L — ABNORMAL LOW (ref 135–145)

## 2013-10-05 LAB — PSA, MEDICARE: PSA: 0.19 ng/ml (ref 0.10–4.00)

## 2013-10-05 MED ORDER — CIPROFLOXACIN HCL 500 MG PO TABS: 500.0000 mg | ORAL_TABLET | Freq: Two times a day (BID) | ORAL | Status: DC

## 2013-10-05 NOTE — Telephone Encounter (Signed)
Patient Information:  Caller Name: [place]  Phone: [phone]  Patient: Walter Salas, Walter Salas  Gender: Male  DOB: Apr 08, 1955  Age: 58 Years  PCP: Ronette Deter (Adults only)  Office Follow Up:  Does the office need to follow up with this patient?: No  Instructions For The Office: N/A  RN Note:  FBS 160 at 0630.  Ejaculation was bloody and painful. Noted small amount of blood in shorts overnight. Noted urinary  frequency, urgency, and difficulty starting urine stream.  Constant pain present both testicles for 2 days. Advised to see provider now per protocol for ED when office is open and appointment available within 4 hours.  No appointments remain at [facility].  Scheduled for 1400 10/05/13 at [facility] with Avie Echevaria, NP.    Symptoms  Reason For Call & Symptoms: "Prostate problems and bleeding." Reports difficulty starting urine stream,  low back pain is worse than normal, and bleeding and pain with ejaculation. Denies hematuria.  Reviewed Health History In EMR: Yes  Reviewed Medications In EMR: Yes  Reviewed Allergies In EMR: Yes  Reviewed Surgeries / Procedures: Yes  Date of Onset of Symptoms: 10/02/2013  Guideline(s) Used:  Penis and Scrotum Symptoms  Urination Pain - Male  Disposition Per Guideline:   Go to ED Now (or to Office with PCP Approval)  Reason For Disposition Reached:   Pain in scrotum or testicle that persists > 1 hour  Advice Given:  Fluids  : Drink extra fluids (Reason: to produce a dilute, nonirritating urine).  Call Back If:  You become worse.  Patient Will Follow Care Advice:  YES  Appointment Scheduled:  10/05/2013 14:00:00 Appointment Scheduled Provider:  Other

## 2013-10-05 NOTE — Progress Notes (Signed)
Pre visit review using our clinic review tool, if applicable. No additional management support is needed unless otherwise documented below in the visit note. 

## 2013-10-05 NOTE — Patient Instructions (Addendum)

## 2013-10-05 NOTE — Progress Notes (Signed)
Subjective:    Patient ID: Walter Salas, male    DOB: Jun 19, 1955, 58 y.o.   MRN: [id number]  HPI  Pt presents to the clinic today with c/o frequency, urgency, discomfort in his testicles and bloody ejaculation. He reports this has been going on for the last 2 days. He denies fever, chills or nausea. He reports that he has had symptoms like this 1 year ago. He was treated for prostatitis. He does have prostate exams done yearly at the [place].  Review of Systems      Past Medical History  Diagnosis Date  . Arthritis   . Depression   . Diabetes mellitus without complication   . Ulcer   . Hyperlipidemia   . Kidney stone   . Colon polyp   . Hx of laminectomy 1999  . CAD (coronary artery disease)     Current Outpatient Prescriptions  Medication Sig Dispense Refill  . atorvastatin (LIPITOR) 80 MG tablet Take 80 mg by mouth daily.      . cholecalciferol (VITAMIN D) 1000 UNITS tablet Take 1,000 Units by mouth QID.       Marland Kitchen fenofibrate (TRICOR) 145 MG tablet Take 145 mg by mouth daily.      . hydroxypropyl methylcellulose (ISOPTO TEARS) 2.5 % ophthalmic solution Place 1 drop into both eyes as needed for dry eyes.      Marland Kitchen lidocaine (LIDODERM) 5 % Place 1 patch onto the skin daily. Remove & Discard patch within 12 hours or as directed by MD  30 patch  0  . lisinopril (PRINIVIL,ZESTRIL) 40 MG tablet Take 20 mg by mouth daily.      . metFORMIN (GLUCOPHAGE) 1000 MG tablet Take 1,000 mg by mouth 2 (two) times daily with a meal.      . metoprolol (LOPRESSOR) 50 MG tablet Take 50 mg by mouth 2 (two) times daily.      . nitroGLYCERIN (NITROSTAT) 0.4 MG SL tablet Place 0.4 mg under the tongue every 5 (five) minutes as needed for chest pain.      Marland Kitchen omeprazole (PRILOSEC) 20 MG capsule Take 20 mg by mouth daily.      . pregabalin (LYRICA) 50 MG capsule Take 50 mg by mouth 3 (three) times daily.      . sennosides-docusate sodium (SENOKOT-S) 8.6-50 MG tablet Take 2 tablets by mouth 2 (two) times daily.       . traMADol (ULTRAM) 50 MG tablet Take 100 mg by mouth 3 (three) times daily as needed.      . traZODone (DESYREL) 100 MG tablet Take 300 mg by mouth at bedtime as needed for sleep.      . vitamin B-12 (CYANOCOBALAMIN) 1000 MCG tablet Take 1,000 mcg by mouth daily.      . ciprofloxacin (CIPRO) 500 MG tablet Take 1 tablet (500 mg total) by mouth 2 (two) times daily.  60 tablet  0   No current facility-administered medications for this visit.    Allergies  Allergen Reactions  . Gabapentin Other (See Comments)    "makes me crazy"    Family History  Problem Relation Age of Onset  . Diabetes Mother   . Heart disease Father   . Diabetes Father   . Diabetes Sister   . Heart disease Sister   . Diabetes Brother   . Heart disease Paternal Grandfather     History   Social History  . Marital Status: Married    Spouse Name: N/A    Number  of Children: N/A  . Years of Education: N/A   Occupational History  . Not on file.   Social History Main Topics  . Smoking status: Current Every Day Smoker -- 0.33 packs/day for 40 years    Types: Cigarettes  . Smokeless tobacco: Never Used  . Alcohol Use: No  . Drug Use: No  . Sexual Activity: Not on file   Other Topics Concern  . Not on file   Social History Narrative   Lives in [place] with wife. Has cat.   Work - Database administrator, retired      Followed at [employer].      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      Diet - Regular   Exercise - starting back at gym     Constitutional: Denies fever, malaise, fatigue, headache or abrupt weight changes.  Gastrointestinal: He does reports some LLQ abdominal pain. Denies bloating, constipation, diarrhea or blood in the stool.  GU: Pt reports urgency, frequency, dysuria and blood in his ejaculate. Denies burning sensation, blood in urine, odor or discharge.   No other specific complaints in a complete review of systems (except as listed in HPI above).  Objective:     Physical Exam  BP 124/66  Pulse 87  Temp(Src) 98.3 F (36.8 C) (Oral)  Wt 235 lb (106.595 kg)  SpO2 98% Wt Readings from Last 3 Encounters:  10/05/13 235 lb (106.595 kg)  08/14/13 227 lb 12 oz (103.307 kg)  06/07/13 230 lb 8 oz (104.554 kg)    General: Appears his stated age, obese but well developed, well nourished in NAD. Cardiovascular: Normal rate and rhythm. S1,S2 noted.  No murmur, rubs or gallops noted. Pulmonary/Chest: Normal effort and positive vesicular breath sounds. No respiratory distress. No wheezes, rales or ronchi noted.  Abdomen: Distended but soft and nontender. Normal bowel sounds, no bruits noted. No distention or masses noted. Liver, spleen and kidneys non palpable. Gentilia: Normal male anatomy. No lesion or ulceration noted at the uretheral opening. Testicles normal. No spermatocele or hydrocele noted. DRE revealed tender, firm enlarged prostate.  BMET    Component Value Date/Time   NA 133* 08/14/2013 0844   K 3.6 08/14/2013 0844   CL 96 08/14/2013 0844   CO2 31 08/14/2013 0844   GLUCOSE 141* 08/14/2013 0844   BUN 14 08/14/2013 0844   CREATININE 0.9 08/14/2013 0844   CALCIUM 9.2 08/14/2013 0844    Lipid Panel     Component Value Date/Time   CHOL 136 08/14/2013 0844   TRIG 260.0* 08/14/2013 0844   HDL 27.50* 08/14/2013 0844   CHOLHDL 5 08/14/2013 0844   VLDL 52.0* 08/14/2013 0844   LDLCALC 27 04/28/2013 1125    CBC    Component Value Date/Time   WBC 7.4 04/28/2013 1125   RBC 4.84 04/28/2013 1125   HGB 13.1 04/28/2013 1125   HCT 39.4 04/28/2013 1125   PLT 212.0 04/28/2013 1125   MCV 81.3 04/28/2013 1125   MCHC 33.3 04/28/2013 1125   RDW 15.6* 04/28/2013 1125   LYMPHSABS 1.0 04/28/2013 1125   MONOABS 0.5 04/28/2013 1125   EOSABS 0.1 04/28/2013 1125   BASOSABS 0.0 04/28/2013 1125    Hgb A1C Lab Results  Component Value Date   HGBA1C 6.7* 08/14/2013         Assessment & Plan:   Acute Prostatitis:  Urinalysis: normal Will obtain CBC, BMET and PSA today Treat  with Cipro BID x 4 weeks  Will call  you with the results. If no improvement, follow up with PCP

## 2013-11-14 ENCOUNTER — Encounter: Payer: Self-pay | Admitting: *Deleted

## 2013-11-14 ENCOUNTER — Ambulatory Visit (INDEPENDENT_AMBULATORY_CARE_PROVIDER_SITE_OTHER): Payer: Medicare Other | Admitting: *Deleted

## 2013-11-14 ENCOUNTER — Encounter: Payer: Self-pay | Admitting: Internal Medicine

## 2013-11-14 ENCOUNTER — Ambulatory Visit (INDEPENDENT_AMBULATORY_CARE_PROVIDER_SITE_OTHER): Payer: Medicare Other | Admitting: Internal Medicine

## 2013-11-14 VITALS — BP 118/58 | HR 64 | Temp 97.7°F | Wt 233.0 lb

## 2013-11-14 DIAGNOSIS — I10 Essential (primary) hypertension: Secondary | ICD-10-CM

## 2013-11-14 DIAGNOSIS — E119 Type 2 diabetes mellitus without complications: Secondary | ICD-10-CM

## 2013-11-14 DIAGNOSIS — Z23 Encounter for immunization: Secondary | ICD-10-CM

## 2013-11-14 DIAGNOSIS — E785 Hyperlipidemia, unspecified: Secondary | ICD-10-CM

## 2013-11-14 DIAGNOSIS — Z1211 Encounter for screening for malignant neoplasm of colon: Secondary | ICD-10-CM

## 2013-11-14 LAB — COMPREHENSIVE METABOLIC PANEL
ALT: 16 U/L (ref 0–53)
AST: 21 U/L (ref 0–37)
Albumin: 3.6 g/dL (ref 3.5–5.2)
Alkaline Phosphatase: 42 U/L (ref 39–117)
BILIRUBIN TOTAL: 0.4 mg/dL (ref 0.2–1.2)
BUN: 12 mg/dL (ref 6–23)
CO2: 25 meq/L (ref 19–32)
Calcium: 9 mg/dL (ref 8.4–10.5)
Chloride: 99 mEq/L (ref 96–112)
Creatinine, Ser: 0.8 mg/dL (ref 0.4–1.5)
GFR: 106.33 mL/min (ref >60.00)
GLUCOSE: 126 mg/dL — AB (ref 70–99)
Potassium: 3.4 mEq/L — ABNORMAL LOW (ref 3.5–5.1)
Sodium: 138 mEq/L (ref 135–145)
Total Protein: 6.7 g/dL (ref 6.0–8.3)

## 2013-11-14 LAB — LDL CHOLESTEROL, DIRECT: Direct LDL: 70.2 mg/dL

## 2013-11-14 LAB — LIPID PANEL
CHOL/HDL RATIO: 5
Cholesterol: 137 mg/dL (ref 0–200)
HDL: 26.7 mg/dL — ABNORMAL LOW (ref >39.00)
NONHDL: 110.3
Triglycerides: 247 mg/dL — ABNORMAL HIGH (ref 0.0–149.0)
VLDL: 49.4 mg/dL — AB (ref 0.0–40.0)

## 2013-11-14 LAB — MICROALBUMIN / CREATININE URINE RATIO
CREATININE, U: 213.7 mg/dL
MICROALB/CREAT RATIO: 1.4 mg/g (ref 0.0–30.0)
Microalb, Ur: 3 mg/dL — ABNORMAL HIGH (ref 0.0–1.9)

## 2013-11-14 LAB — HEMOGLOBIN A1C: HEMOGLOBIN A1C: 6.8 % — AB (ref 4.6–6.5)

## 2013-11-14 NOTE — Assessment & Plan Note (Signed)
Will check A1c with labs. Continue Metformin. 

## 2013-11-14 NOTE — Assessment & Plan Note (Signed)
Will check lipids with labs today. Continue Atorvastatin. 

## 2013-11-14 NOTE — Assessment & Plan Note (Signed)
BP Readings from Last 3 Encounters:  11/14/13 118/58  10/05/13 124/66  08/14/13 98/62   BP well controlled. Will check renal function with labs.

## 2013-11-14 NOTE — Progress Notes (Signed)
Subjective:    Patient ID: Walter Salas, male    DOB: 02-16-1955, 58 y.o.   MRN: [id number]  HPI 58YO male presents for follow up.  DM - BG well controlled 120-150. Compliant with metformin.  Recently treated for prostatitis with Cipro x 4 weeks. Symptoms have completely resolved.  No new concerns today. Would like to schedule a routine colonoscopy.  Review of Systems  Constitutional: Negative for fever, chills, activity change, appetite change, fatigue and unexpected weight change.  Eyes: Negative for visual disturbance.  Respiratory: Negative for cough and shortness of breath.   Cardiovascular: Negative for chest pain, palpitations and leg swelling.  Gastrointestinal: Negative for vomiting, abdominal pain, diarrhea, constipation and abdominal distention.  Genitourinary: Negative for dysuria, urgency and difficulty urinating.  Musculoskeletal: Negative for arthralgias and gait problem.  Skin: Negative for color change and rash.  Hematological: Negative for adenopathy.  Psychiatric/Behavioral: Negative for sleep disturbance and dysphoric mood. The patient is not nervous/anxious.        Objective:    BP 118/58 mmHg  Pulse 64  Temp(Src) 97.7 F (36.5 C)  Wt 233 lb (105.688 kg)  SpO2 94% Physical Exam  Constitutional: He is oriented to person, place, and time. He appears well-developed and well-nourished. No distress.  HENT:  Head: Normocephalic and atraumatic.  Right Ear: External ear normal.  Left Ear: External ear normal.  Nose: Nose normal.  Mouth/Throat: Oropharynx is clear and moist. No oropharyngeal exudate.  Eyes: Conjunctivae and EOM are normal. Pupils are equal, round, and reactive to light. Right eye exhibits no discharge. Left eye exhibits no discharge. No scleral icterus.  Neck: Normal range of motion. Neck supple. No tracheal deviation present. No thyromegaly present.  Cardiovascular: Normal rate, regular rhythm and normal heart sounds.  Exam reveals no  gallop and no friction rub.   No murmur heard. Pulmonary/Chest: Effort normal and breath sounds normal. No accessory muscle usage. No tachypnea. No respiratory distress. He has no decreased breath sounds. He has no wheezes. He has no rhonchi. He has no rales. He exhibits no tenderness.  Musculoskeletal: Normal range of motion. He exhibits no edema.  Lymphadenopathy:    He has no cervical adenopathy.  Neurological: He is alert and oriented to person, place, and time. No cranial nerve deficit. Coordination normal.  Skin: Skin is warm and dry. No rash noted. He is not diaphoretic. No erythema. No pallor.  Psychiatric: He has a normal mood and affect. His behavior is normal. Judgment and thought content normal.          Assessment & Plan:   Problem List Items Addressed This Visit      Unprioritized   Diabetes mellitus, type 2 - Primary    Will check A1c with labs. Continue Metformin.    Relevant Orders      Comprehensive metabolic panel      Hemoglobin A1c      Lipid panel      Microalbumin / creatinine urine ratio   Essential hypertension, benign    BP Readings from Last 3 Encounters:  11/14/13 118/58  10/05/13 124/66  08/14/13 98/62   BP well controlled. Will check renal function with labs.    Hyperlipidemia    Will check lipids with labs today. Continue Atorvastatin.     Other Visit Diagnoses    Special screening for malignant neoplasms, colon        Relevant Orders       Ambulatory referral to Gastroenterology  Return in about 3 months (around 02/14/2014) for Recheck of Diabetes.

## 2013-11-14 NOTE — Patient Instructions (Signed)
Labs today.  Follow up in 3 months and sooner as needed. 

## 2013-12-29 ENCOUNTER — Ambulatory Visit: Payer: Self-pay | Admitting: Gastroenterology

## 2013-12-29 LAB — HM COLONOSCOPY

## 2014-02-14 ENCOUNTER — Encounter: Payer: Self-pay | Admitting: Internal Medicine

## 2014-02-14 ENCOUNTER — Ambulatory Visit (INDEPENDENT_AMBULATORY_CARE_PROVIDER_SITE_OTHER): Payer: Medicare Other | Admitting: Internal Medicine

## 2014-02-14 VITALS — BP 99/62 | HR 66 | Temp 98.2°F | Ht 70.5 in | Wt 226.0 lb

## 2014-02-14 DIAGNOSIS — I10 Essential (primary) hypertension: Secondary | ICD-10-CM

## 2014-02-14 DIAGNOSIS — R0789 Other chest pain: Secondary | ICD-10-CM | POA: Diagnosis not present

## 2014-02-14 DIAGNOSIS — E119 Type 2 diabetes mellitus without complications: Secondary | ICD-10-CM | POA: Diagnosis not present

## 2014-02-14 LAB — COMPREHENSIVE METABOLIC PANEL
ALT: 15 U/L (ref 0–53)
AST: 16 U/L (ref 0–37)
Albumin: 4.5 g/dL (ref 3.5–5.2)
Alkaline Phosphatase: 51 U/L (ref 39–117)
BUN: 14 mg/dL (ref 6–23)
CO2: 29 mEq/L (ref 19–32)
Calcium: 9.3 mg/dL (ref 8.4–10.5)
Chloride: 100 mEq/L (ref 96–112)
Creatinine, Ser: 0.88 mg/dL (ref 0.40–1.50)
GFR: 93.8 mL/min (ref >60.00)
Glucose, Bld: 134 mg/dL — ABNORMAL HIGH (ref 70–99)
Potassium: 3.8 mEq/L (ref 3.5–5.1)
Sodium: 136 mEq/L (ref 135–145)
Total Bilirubin: 0.6 mg/dL (ref 0.2–1.2)
Total Protein: 7 g/dL (ref 6.0–8.3)

## 2014-02-14 LAB — HEMOGLOBIN A1C: Hgb A1c MFr Bld: 6.5 % (ref 4.6–6.5)

## 2014-02-14 LAB — LIPID PANEL
Cholesterol: 117 mg/dL (ref 0–200)
HDL: 29.8 mg/dL — ABNORMAL LOW (ref >39.00)
LDL Cholesterol: 56 mg/dL (ref 0–99)
NonHDL: 87.2
Total CHOL/HDL Ratio: 4
Triglycerides: 158 mg/dL — ABNORMAL HIGH (ref 0.0–149.0)
VLDL: 31.6 mg/dL (ref 0.0–40.0)

## 2014-02-14 LAB — MICROALBUMIN / CREATININE URINE RATIO
Creatinine,U: 126 mg/dL
Microalb Creat Ratio: 1.9 mg/g (ref 0.0–30.0)
Microalb, Ur: 2.4 mg/dL — ABNORMAL HIGH (ref 0.0–1.9)

## 2014-02-14 NOTE — Assessment & Plan Note (Signed)
BG well controlled per pt. Will check A1c with labs. Continue current medication.

## 2014-02-14 NOTE — Progress Notes (Signed)
Pre visit review using our clinic review tool, if applicable. No additional management support is needed unless otherwise documented below in the visit note. 

## 2014-02-14 NOTE — Assessment & Plan Note (Signed)
BP Readings from Last 3 Encounters:  02/14/14 99/62  11/14/13 118/58  10/05/13 124/66   BP well controlled. Will continue current medications. Renal function with labs.

## 2014-02-14 NOTE — Progress Notes (Signed)
Subjective:    Patient ID: Walter Salas, male    DOB: 1955-12-26, 59 y.o.   MRN: [id number]  HPI  59YO male presents for follow up.  Continues to have right side pain. Ongoing for over 1 year. Sharp pain that is tender to touch.Tried Lidoderm patch with no improvement. Also taking Lyrica with no improvement.  DM - BG running 100-110. Compliant with meds.  Past medical, surgical, family and social history per today's encounter.  Review of Systems  Constitutional: Negative for fever, chills, activity change, appetite change, fatigue and unexpected weight change.  Eyes: Negative for visual disturbance.  Respiratory: Negative for cough and shortness of breath.   Cardiovascular: Positive for chest pain. Negative for palpitations and leg swelling.  Gastrointestinal: Negative for nausea, vomiting, abdominal pain, diarrhea, constipation and abdominal distention.  Genitourinary: Negative for dysuria, urgency and difficulty urinating.  Musculoskeletal: Positive for myalgias. Negative for arthralgias and gait problem.  Skin: Negative for color change and rash.  Hematological: Negative for adenopathy.  Psychiatric/Behavioral: Negative for sleep disturbance and dysphoric mood. The patient is not nervous/anxious.        Objective:    BP 99/62 mmHg  Pulse 66  Temp(Src) 98.2 F (36.8 C) (Oral)  Ht 5' 10.5" (1.791 m)  Wt 226 lb (102.513 kg)  BMI 31.96 kg/m2  SpO2 96% Physical Exam  Constitutional: He is oriented to person, place, and time. He appears well-developed and well-nourished. No distress.  HENT:  Head: Normocephalic and atraumatic.  Right Ear: External ear normal.  Left Ear: External ear normal.  Nose: Nose normal.  Mouth/Throat: Oropharynx is clear and moist. No oropharyngeal exudate.  Eyes: Conjunctivae and EOM are normal. Pupils are equal, round, and reactive to light. Right eye exhibits no discharge. Left eye exhibits no discharge. No scleral icterus.  Neck: Normal  range of motion. Neck supple. No tracheal deviation present. No thyromegaly present.  Cardiovascular: Normal rate, regular rhythm and normal heart sounds.  Exam reveals no gallop and no friction rub.   No murmur heard. Pulmonary/Chest: Effort normal and breath sounds normal. No accessory muscle usage. No tachypnea. No respiratory distress. He has no decreased breath sounds. He has no wheezes. He has no rhonchi. He has no rales. He exhibits no tenderness.    Musculoskeletal: Normal range of motion. He exhibits no edema.  Lymphadenopathy:    He has no cervical adenopathy.  Neurological: He is alert and oriented to person, place, and time. No cranial nerve deficit. Coordination normal.  Skin: Skin is warm and dry. No rash noted. He is not diaphoretic. No erythema. No pallor.  Psychiatric: He has a normal mood and affect. His behavior is normal. Judgment and thought content normal.          Assessment & Plan:   Problem List Items Addressed This Visit      Unprioritized   Chest wall pain    Focal chest wall pain at site of previous rib fracture. No improvement with Lyrica, Lidoderm patch. Will set up sports med evaluation. Question if local steroid injection might be helpful.      Relevant Orders   Ambulatory referral to Sports Medicine   Diabetes mellitus, type 2 - Primary    BG well controlled per pt. Will check A1c with labs. Continue current medication.      Relevant Orders   Comprehensive metabolic panel   Hemoglobin A1c   Lipid panel   Microalbumin / creatinine urine ratio   Essential hypertension, benign  BP Readings from Last 3 Encounters:  02/14/14 99/62  11/14/13 118/58  10/05/13 124/66   BP well controlled. Will continue current medications. Renal function with labs.          Return in about 3 months (around 05/15/2014) for Recheck of Diabetes.

## 2014-02-14 NOTE — Patient Instructions (Signed)
Labs today.  We will set up evaluation with Dr. Tamala Julian in Sports Medicine.  Follow up in 3 months.

## 2014-02-14 NOTE — Assessment & Plan Note (Signed)
Focal chest wall pain at site of previous rib fracture. No improvement with Lyrica, Lidoderm patch. Will set up sports med evaluation. Question if local steroid injection might be helpful.

## 2014-02-15 ENCOUNTER — Encounter: Payer: Self-pay | Admitting: *Deleted

## 2014-02-23 ENCOUNTER — Encounter: Payer: Self-pay | Admitting: Family Medicine

## 2014-02-23 ENCOUNTER — Other Ambulatory Visit (INDEPENDENT_AMBULATORY_CARE_PROVIDER_SITE_OTHER): Payer: Medicare Other

## 2014-02-23 ENCOUNTER — Ambulatory Visit (INDEPENDENT_AMBULATORY_CARE_PROVIDER_SITE_OTHER): Payer: Medicare Other | Admitting: Family Medicine

## 2014-02-23 VITALS — BP 90/60 | HR 76 | Ht 70.5 in | Wt 230.0 lb

## 2014-02-23 DIAGNOSIS — R0789 Other chest pain: Secondary | ICD-10-CM | POA: Diagnosis not present

## 2014-02-23 DIAGNOSIS — K439 Ventral hernia without obstruction or gangrene: Secondary | ICD-10-CM | POA: Diagnosis not present

## 2014-02-23 DIAGNOSIS — R0781 Pleurodynia: Secondary | ICD-10-CM | POA: Diagnosis not present

## 2014-02-23 NOTE — Assessment & Plan Note (Signed)
Will follow up with the [facility].

## 2014-02-23 NOTE — Progress Notes (Signed)
Pre visit review using our clinic review tool, if applicable. No additional management support is needed unless otherwise documented below in the visit note. 

## 2014-02-23 NOTE — Progress Notes (Signed)
  [facility] [place], [place] [address] Phone: [phone] Subjective:    I'm seeing this patient by the request  of:  Rica Mast, MD   CC: Chest wall pain  EEF:[id number] Page A Walter Salas is a 59 y.o. male coming in with complaint of chest wall pain. Patient states that this is on the right side. Describes it as a sharp pain that is tender to palpation. Patient has tried a Lidoderm patch as well as Lyrica with no significant improvement. Patient states that this is been going on for approximately 1 year. Patient does have a history of potentially a rib fracture in this area. States after fall on a ladder. Patient states sometimes when he is doing a lot of activity can be painful but usually more due to direct palpation. Denies any fevers or chills, denies any abnormal weight loss. Denies any association with bowel discomfort. Patient states it only hurts with direct impact.     Past medical history, social, surgical and family history all reviewed in electronic medical record.   Review of Systems: No headache, visual changes, nausea, vomiting, diarrhea, constipation, dizziness, abdominal pain, skin rash, fevers, chills, night sweats, weight loss, swollen lymph nodes, body aches, joint swelling, muscle aches, chest pain, shortness of breath, mood changes.   Objective Blood pressure 90/60, pulse 76, height 5' 10.5" (1.791 m), weight 230 lb (104.327 kg), SpO2 91 %.  General: No apparent distress alert and oriented x3 mood and affect normal, dressed appropriately.  HEENT: Pupils equal, extraocular movements intact  Respiratory: Patient's speak in full sentences and does not appear short of breath  Cardiovascular: No lower extremity edema, non tender, no erythema  Skin: Warm dry intact with no signs of infection or rash on extremities or on axial skeleton.  Abdomen: Soft and only tender with a ventral hernia. Very large. Neuro: Cranial  nerves II through XII are intact, neurovascularly intact in all extremities with 2+ DTRs and 2+ pulses.  Lymph: No lymphadenopathy of posterior or anterior cervical chain or axillae bilaterally.  Gait normal with good balance and coordination.  MSK:  Non tender with full range of motion and good stability and symmetric strength and tone of shoulders, elbows, wrist, hip, knee and ankles bilaterally.  Patient does have area on the 11th rib on the right side that is very tender to palpation. No masses felt. Patient has a negative McBurney sign or Murphy sign. Patient's liver is not palpated. No overlying skin changes noted.  Limited musculoskeletal ultrasound was performed and interpreted by me today. Limited ultrasound of this 11th rib shows the patient does have significant scar tissue proximally above the rib. This is very tender with increasing Doppler flow. No true bony abnormality noted. Impression: Residual Scar tissue over 11th rib on right side.   Impression and Recommendations:     This case required medical decision making of moderate complexity.

## 2014-02-23 NOTE — Patient Instructions (Signed)
Good to see you Ice 20 minutes 2 times daily. Usually after activity and before bed. Exercises 3 times a week.  [facility]  Pennsaid twice daily.  Vitamin D 4000 IU daily.  Fish oil 2 grams daily.  See me again in 3-4 weeks.

## 2014-02-23 NOTE — Assessment & Plan Note (Signed)
Patient's chest wall pain appears to be mostly secondary to scar tissue formation. Patient did have an injury that was the start of this. Differential also would include any liver pathology but I do not feel any enlargement of the liver today. I do not think that further imaging is necessary. We discussed topical anti-inflammatories, icing protocol. Patient will use his TENS unit and we discussed about manual massage techniques. Patient will try some over-the-counter medicines I think will be beneficial as well. Discussed with patient though he also has a large ventral hernia. He will discuss this with the [facility]. Patient is going to try this conservative therapy and come back again in 3 weeks. Continuing to have pain we can consider a potential steroid injection in the scar tissue but I am hoping he will respond well to the conservative therapy.

## 2014-02-28 ENCOUNTER — Telehealth: Payer: Self-pay

## 2014-02-28 NOTE — Telephone Encounter (Signed)
Left vm for pt to return my call.  

## 2014-02-28 NOTE — Telephone Encounter (Signed)
He was seen by Dr. Tamala Julian. We should set up follow up here

## 2014-02-28 NOTE — Telephone Encounter (Signed)
The patient is hoping to have a referral placed for his ongoing hernia problems.

## 2014-03-01 NOTE — Telephone Encounter (Signed)
Notified pt. 

## 2014-03-05 ENCOUNTER — Encounter: Payer: Self-pay | Admitting: Internal Medicine

## 2014-03-05 ENCOUNTER — Ambulatory Visit (INDEPENDENT_AMBULATORY_CARE_PROVIDER_SITE_OTHER): Payer: Medicare Other | Admitting: Internal Medicine

## 2014-03-05 VITALS — BP 122/68 | HR 72 | Temp 98.3°F | Resp 14 | Ht 70.5 in | Wt 231.2 lb

## 2014-03-05 DIAGNOSIS — E119 Type 2 diabetes mellitus without complications: Secondary | ICD-10-CM

## 2014-03-05 DIAGNOSIS — K439 Ventral hernia without obstruction or gangrene: Secondary | ICD-10-CM

## 2014-03-05 DIAGNOSIS — I10 Essential (primary) hypertension: Secondary | ICD-10-CM

## 2014-03-05 NOTE — Assessment & Plan Note (Signed)
BP Readings from Last 3 Encounters:  03/05/14 122/68  02/23/14 90/60  02/14/14 99/62   BP well controlled. Continue current medications.

## 2014-03-05 NOTE — Progress Notes (Signed)
Subjective:    Patient ID: Walter Salas, male    DOB: 12-11-1955, 59 y.o.   MRN: [id number]  HPI  59YO male presents for follow up.  Ventral hernia - Has occasional pain over abdomen. Notes bulging in mid abdomen. Pain at lower area of bulge. Dr. Tamala Julian had recommended surgical referral. Tried referral at [facility], but they declined because of his weight.  DM - BG near 120 mostly Compliant with medication.  Past medical, surgical, family and social history per today's encounter.  Review of Systems  Constitutional: Negative for fever, chills, activity change, appetite change, fatigue and unexpected weight change.  Eyes: Negative for visual disturbance.  Respiratory: Negative for cough and shortness of breath.   Cardiovascular: Negative for chest pain, palpitations and leg swelling.  Gastrointestinal: Positive for abdominal pain. Negative for nausea, vomiting, diarrhea, constipation and abdominal distention.  Genitourinary: Negative for dysuria, urgency and difficulty urinating.  Musculoskeletal: Positive for myalgias and arthralgias. Negative for gait problem.  Skin: Negative for color change and rash.  Hematological: Negative for adenopathy.  Psychiatric/Behavioral: Negative for sleep disturbance and dysphoric mood. The patient is not nervous/anxious.        Objective:    BP 122/68 mmHg  Pulse 72  Temp(Src) 98.3 F (36.8 C) (Oral)  Resp 14  Ht 5' 10.5" (1.791 m)  Wt 231 lb 4 oz (104.894 kg)  BMI 32.70 kg/m2  SpO2 93% Physical Exam  Constitutional: He is oriented to person, place, and time. He appears well-developed and well-nourished. No distress.  HENT:  Head: Normocephalic and atraumatic.  Right Ear: External ear normal.  Left Ear: External ear normal.  Nose: Nose normal.  Mouth/Throat: Oropharynx is clear and moist.  Eyes: Conjunctivae and EOM are normal. Pupils are equal, round, and reactive to light. Right eye exhibits no discharge. Left eye exhibits no discharge. No  scleral icterus.  Neck: Normal range of motion. Neck supple. No tracheal deviation present. No thyromegaly present.  Cardiovascular: Normal rate, regular rhythm and normal heart sounds.  Exam reveals no gallop and no friction rub.   No murmur heard. Pulmonary/Chest: Effort normal and breath sounds normal. No respiratory distress. He has no wheezes. He has no rales. He exhibits no tenderness.  Abdominal: Soft. Bowel sounds are normal. He exhibits no distension and no mass. There is no tenderness. There is no rebound and no guarding. A hernia is present. Hernia confirmed positive in the ventral area.    Musculoskeletal: Normal range of motion. He exhibits no edema.  Lymphadenopathy:    He has no cervical adenopathy.  Neurological: He is alert and oriented to person, place, and time. No cranial nerve deficit. Coordination normal.  Skin: Skin is warm and dry. No rash noted. He is not diaphoretic. No erythema. No pallor.  Psychiatric: He has a normal mood and affect. His behavior is normal. Judgment and thought content normal.          Assessment & Plan:   Problem List Items Addressed This Visit      Unprioritized   Diabetes mellitus, type 2    BG well controlled. Repeat A1c in 05/2014.      Essential hypertension, benign    BP Readings from Last 3 Encounters:  03/05/14 122/68  02/23/14 90/60  02/14/14 99/62   BP well controlled. Continue current medications.      Ventral hernia - Primary    Ventral wall hernia with some pain described at inferior edge. Hernia is reducible. Will set up surgical  evaluation for possible repair.      Relevant Orders   Ambulatory referral to General Surgery       Return in about 3 months (around 06/03/2014) for Recheck of Diabetes.

## 2014-03-05 NOTE — Assessment & Plan Note (Signed)
BG well controlled. Repeat A1c in 05/2014.

## 2014-03-05 NOTE — Assessment & Plan Note (Signed)
Ventral wall hernia with some pain described at inferior edge. Hernia is reducible. Will set up surgical evaluation for possible repair.

## 2014-03-05 NOTE — Patient Instructions (Signed)
Labs in 3 months.  We will set up evaluation with Dr. Bary Castilla.

## 2014-03-05 NOTE — Progress Notes (Signed)
Pre-visit discussion using our clinic review tool. No additional management support is needed unless otherwise documented below in the visit note.  

## 2014-03-06 ENCOUNTER — Telehealth: Payer: Self-pay | Admitting: Internal Medicine

## 2014-03-06 ENCOUNTER — Encounter: Payer: Self-pay | Admitting: *Deleted

## 2014-03-06 NOTE — Telephone Encounter (Signed)
emmi emailed °

## 2014-03-16 ENCOUNTER — Ambulatory Visit (INDEPENDENT_AMBULATORY_CARE_PROVIDER_SITE_OTHER): Payer: Medicare Other | Admitting: Family Medicine

## 2014-03-16 ENCOUNTER — Other Ambulatory Visit (INDEPENDENT_AMBULATORY_CARE_PROVIDER_SITE_OTHER): Payer: Medicare Other

## 2014-03-16 ENCOUNTER — Encounter: Payer: Self-pay | Admitting: Family Medicine

## 2014-03-16 VITALS — BP 104/62 | HR 72 | Wt 230.0 lb

## 2014-03-16 DIAGNOSIS — R0781 Pleurodynia: Secondary | ICD-10-CM

## 2014-03-16 DIAGNOSIS — R0789 Other chest pain: Secondary | ICD-10-CM

## 2014-03-16 NOTE — Assessment & Plan Note (Signed)
Patient was given an injection into the scar tissue today to see if we can make some significant improvement. Patient already had significant decrease in pain most immediately. This makes me feel somewhat optimistic. We discussed icing regimen and continuing the home exercises. Patient and will come back and see me again in 3 weeks

## 2014-03-16 NOTE — Progress Notes (Signed)
[facility] [place] [place], [place] [address] Phone: [phone] Subjective:    I'm seeing this patient by the request  of:  Rica Mast, MD   CC: Chest wall pain  [id number] Tyresse A Poke is a 59 y.o. male coming in with complaint of chest wall pain. Patient states that this is on the right side. Describes it as a sharp pain that is tender to palpation. Patient has tried a Lidoderm patch as well as Lyrica with no significant improvement. Patient states that this is been going on for approximately 1 year. Patient was found to have more scar tissue in this area. Patient did try conservative therapy but did not make any significant improvement he states. Patient states the topical medicine did not seem to improve and unfortunately the exercises seem to exacerbate the situation. Patient would like to try something more intense.     Past medical history, social, surgical and family history all reviewed in electronic medical record.   Review of Systems: No headache, visual changes, nausea, vomiting, diarrhea, constipation, dizziness, abdominal pain, skin rash, fevers, chills, night sweats, weight loss, swollen lymph nodes, body aches, joint swelling, muscle aches, chest pain, shortness of breath, mood changes.   Objective Blood pressure 104/62, pulse 72, weight 230 lb (104.327 kg), SpO2 93 %.  General: No apparent distress alert and oriented x3 mood and affect normal, dressed appropriately.  HEENT: Pupils equal, extraocular movements intact  Respiratory: Patient's speak in full sentences and does not appear short of breath  Cardiovascular: No lower extremity edema, non tender, no erythema  Skin: Warm dry intact with no signs of infection or rash on extremities or on axial skeleton.  Abdomen: Soft and only tender with a ventral hernia. Very large. Neuro: Cranial nerves II through XII are intact, neurovascularly intact in all extremities with  2+ DTRs and 2+ pulses.  Lymph: No lymphadenopathy of posterior or anterior cervical chain or axillae bilaterally.  Gait normal with good balance and coordination.  MSK:  Non tender with full range of motion and good stability and symmetric strength and tone of shoulders, elbows, wrist, hip, knee and ankles bilaterally.  Patient does have area on the 11th rib on the right side that is very tender to palpation. No masses felt. Patient has a negative McBurney sign or Murphy sign. Patient's liver is not palpated. No overlying skin changes noted. No change from previous exam  Limited musculoskeletal ultrasound was performed and interpreted by me today. Limited ultrasound of this 11th rib shows the patient does have significant scar tissue proximally above the rib. This is very tender with increasing Doppler flow. No true bony abnormality noted. Impression: Residual Scar tissue over 11th rib on right side of present  Procedure: Real-time Ultrasound Guided Injection of scar tissue over 11th rib on right side  Device: GE Logiq E  Ultrasound guided injection is preferred based studies that show increased duration, increased effect, greater accuracy, decreased procedural pain, increased response rate, and decreased cost with ultrasound guided versus blind injection.  Verbal informed consent obtained.  Time-out conducted.  Noted no overlying erythema, induration, or other signs of local infection.  Skin prepped in a sterile fashion.  Local anesthesia: Topical Ethyl chloride.  With sterile technique and under real time ultrasound guidance: 25-gauge 1 inch needle was used and under ultrasound guidance 0.5 mL of 0.5% Marcaine and 0.5 mL of Kenalog 41 g/dL reviews.  Completed without difficulty  Pain immediately resolved suggesting accurate  placement of the medication.  Advised to call if fevers/chills, erythema, induration, drainage, or persistent bleeding.  Images permanently stored and available for  review in the ultrasound unit.  Impression: Technically successful ultrasound guided injection.   Impression and Recommendations:     This case required medical decision making of moderate complexity.

## 2014-03-16 NOTE — Patient Instructions (Signed)
Good to see you I hope the general surgery can help with the hernia Lets see if this injecitons helps Ice in 6 hours Continue the exercises See me again in 2-3 weeks.

## 2014-03-16 NOTE — Progress Notes (Signed)
Pre visit review using our clinic review tool, if applicable. No additional management support is needed unless otherwise documented below in the visit note. 

## 2014-03-20 ENCOUNTER — Encounter: Payer: Self-pay | Admitting: General Surgery

## 2014-03-20 ENCOUNTER — Ambulatory Visit: Payer: Medicare Other | Admitting: Internal Medicine

## 2014-03-20 ENCOUNTER — Ambulatory Visit (INDEPENDENT_AMBULATORY_CARE_PROVIDER_SITE_OTHER): Payer: Medicare Other | Admitting: General Surgery

## 2014-03-20 VITALS — BP 128/60 | HR 70 | Resp 16 | Ht 70.0 in | Wt 226.0 lb

## 2014-03-20 DIAGNOSIS — M6208 Separation of muscle (nontraumatic), other site: Secondary | ICD-10-CM | POA: Diagnosis not present

## 2014-03-20 NOTE — Progress Notes (Signed)
Patient ID: Walter Salas, male   DOB: [birth year], 59 y.o.   MRN: [id number]  Chief Complaint  Patient presents with  . Hernia    HPI Walter Salas is a 59 y.o. male.  He is here for an evaluation of possible hernia. He feel off a roof about 1 1/2 years ago and had fracture ribs, blood clots and a back fracture. States that Dr. Hulan Saas noticed it about 6-8 weeks ago when he was following up with him about the right flank pain which he thinks is related to the injury. He did have an injection right rib area last Friday. The patient was on a ladder when it slipped away from the foundation and he rode the ladder down landing on his right side across the ladder. He was hospitalized for a week at [facility]. He does state that he has some abdominal pain and sees a bulge.   His bowels are regular and move daily with the help of stool softeners.  He typically goes to the [place] for care. He is here today with Walter Salas,  his wife of 74 years. She is the the sister of Walter Salas.   HPI  Past Medical History  Diagnosis Date  . Arthritis   . Depression   . Diabetes mellitus without complication   . Ulcer   . Hyperlipidemia   . Kidney stone   . Colon polyp   . Hx of laminectomy 1999  . CAD (coronary artery disease)     Past Surgical History  Procedure Laterality Date  . Coronary angioplasty with stent placement  2014    [facility] [employer], [facility]  . Shoulder arthroscopy Right 2012  . Cervical fusion  2000. 2003  . Back surgery  1993, 1994, 1997, 1998  . Carpal tunnel release Bilateral 1990  . Cholecystectomy  2000  . Tumor excision  2007    Family History  Problem Relation Age of Onset  . Diabetes Mother   . Heart disease Father   . Diabetes Father   . Diabetes Sister   . Heart disease Sister   . Diabetes Brother   . Heart disease Paternal Grandfather     Social History History  Substance Use Topics  . Smoking status: Current Every Day Smoker --  0.33 packs/day for 40 years    Types: Cigarettes  . Smokeless tobacco: Never Used  . Alcohol Use: No    Allergies  Allergen Reactions  . Gabapentin Other (See Comments)    "makes me crazy"    Current Outpatient Prescriptions  Medication Sig Dispense Refill  . atorvastatin (LIPITOR) 80 MG tablet Take 80 mg by mouth daily.    . cholecalciferol (VITAMIN D) 1000 UNITS tablet Take 1,000 Units by mouth QID.     Marland Kitchen fenofibrate (TRICOR) 145 MG tablet Take 145 mg by mouth daily.    . hydroxypropyl methylcellulose (ISOPTO TEARS) 2.5 % ophthalmic solution Place 1 drop into both eyes as needed for dry eyes.    Marland Kitchen lidocaine (LIDODERM) 5 % Place 1 patch onto the skin daily. Remove & Discard patch within 12 hours or as directed by MD 30 patch 0  . lisinopril (PRINIVIL,ZESTRIL) 40 MG tablet Take 20 mg by mouth daily.    . metFORMIN (GLUCOPHAGE) 1000 MG tablet Take 1,000 mg by mouth 2 (two) times daily with a meal.    . methocarbamol (ROBAXIN) 750 MG tablet Take 750 mg by mouth 3 (three) times daily  as needed for muscle spasms.    . metoprolol (LOPRESSOR) 50 MG tablet Take 50 mg by mouth 2 (two) times daily.    . nitroGLYCERIN (NITROSTAT) 0.4 MG SL tablet Place 0.4 mg under the tongue every 5 (five) minutes as needed for chest pain.    Marland Kitchen omeprazole (PRILOSEC) 20 MG capsule Take 20 mg by mouth daily.    . pregabalin (LYRICA) 50 MG capsule Take 50 mg by mouth 3 (three) times daily.    . sennosides-docusate sodium (SENOKOT-S) 8.6-50 MG tablet Take 2 tablets by mouth 2 (two) times daily.    . traMADol (ULTRAM) 50 MG tablet Take 100 mg by mouth 3 (three) times daily as needed.    . traZODone (DESYREL) 100 MG tablet Take 300 mg by mouth at bedtime as needed for sleep.    . vitamin B-12 (CYANOCOBALAMIN) 1000 MCG tablet Take 1,000 mcg by mouth daily.     No current facility-administered medications for this visit.    Review of Systems Review of Systems  Constitutional: Negative.   Respiratory: Negative.    Cardiovascular: Negative.   Gastrointestinal: Positive for constipation. Negative for diarrhea and blood in stool.    Blood pressure 128/60, pulse 70, resp. rate 16, height 5\' 10"  (1.778 m), weight 226 lb (102.513 kg).  Physical Exam Physical Exam  Constitutional: He is oriented to person, place, and time. He appears well-developed and well-nourished.  Neck: Neck supple.  Cardiovascular: Normal rate, regular rhythm and normal heart sounds.   Pulses:      Femoral pulses are 2+ on the right side, and 2+ on the left side. Pulmonary/Chest: Effort normal and breath sounds normal.  Tenderness at T9 mid right axillary line  Abdominal: Soft. Normal appearance. There is no tenderness. No hernia.    Diastasis recti is present.  Lymphadenopathy:    He has no cervical adenopathy.  Neurological: He is alert and oriented to person, place, and time.  Skin: Skin is warm and dry.      Assessment    Diastases recti.    Plan    At this time there is no indication for surgical intervention. There is no fascial weakness, and there is no limitation of activity.     Proper lifting techniques reviewed.  PCP:  Sonnie Alamo 03/21/2014, 7:13 AM

## 2014-03-20 NOTE — Patient Instructions (Addendum)
The patient is aware to call back for any questions or concerns.  

## 2014-03-21 DIAGNOSIS — M6208 Separation of muscle (nontraumatic), other site: Secondary | ICD-10-CM | POA: Insufficient documentation

## 2014-04-09 ENCOUNTER — Ambulatory Visit (INDEPENDENT_AMBULATORY_CARE_PROVIDER_SITE_OTHER): Payer: Medicare Other | Admitting: Family Medicine

## 2014-04-09 ENCOUNTER — Encounter: Payer: Self-pay | Admitting: Family Medicine

## 2014-04-09 VITALS — BP 102/60 | HR 72 | Ht 70.0 in | Wt 226.0 lb

## 2014-04-09 DIAGNOSIS — R0789 Other chest pain: Secondary | ICD-10-CM | POA: Diagnosis not present

## 2014-04-09 NOTE — Progress Notes (Signed)
Pre visit review using our clinic review tool, if applicable. No additional management support is needed unless otherwise documented below in the visit note. 

## 2014-04-09 NOTE — Assessment & Plan Note (Signed)
Patient is 60% better and encouraged patient to continue to increase activity as tolerated. Patient is using no pain medications at this time which is significantly better as well. Patient knows we can repeat the injection every 3-4 months if necessary but I think this will be highly unlikely. Patient in follow-up as needed.

## 2014-04-09 NOTE — Progress Notes (Signed)
  [facility] [place], [place] [address] Phone: [phone] Subjective:     CC: Chest wall pain follow-up  [id number] Walter Salas is a 59 y.o. male coming in with complaint of chest wall pain. Patient states that this is on the right side. Describes it as a sharp pain that is tender to palpation. Patient has tried a Lidoderm patch as well as Lyrica with no significant improvement. Patient states that this is been going on for approximately 1 year.  Patient was seen by me 3 weeks ago and was given a injection. Patient states that he is approximately 60% better. An states that he is able to sleep comfortably at night. Patient states that he is no longer having severe pain with certain activities. Patient states that he notices it still but much better.    Past medical history, social, surgical and family history all reviewed in electronic medical record.   Review of Systems: No headache, visual changes, nausea, vomiting, diarrhea, constipation, dizziness, abdominal pain, skin rash, fevers, chills, night sweats, weight loss, swollen lymph nodes, body aches, joint swelling, muscle aches, chest pain, shortness of breath, mood changes.   Objective Blood pressure 102/60, pulse 72, height 5\' 10"  (1.778 m), weight 226 lb (102.513 kg), SpO2 91 %.  General: No apparent distress alert and oriented x3 mood and affect normal, dressed appropriately.  HEENT: Pupils equal, extraocular movements intact  Respiratory: Patient's speak in full sentences and does not appear short of breath  Cardiovascular: No lower extremity edema, non tender, no erythema  Skin: Warm dry intact with no signs of infection or rash on extremities or on axial skeleton.  Abdomen: Soft and only tender with a ventral hernia. Very large. Neuro: Cranial nerves II through XII are intact, neurovascularly intact in all extremities with 2+ DTRs and 2+ pulses.  Lymph: No lymphadenopathy of  posterior or anterior cervical chain or axillae bilaterally.  Gait normal with good balance and coordination.  MSK:  Non tender with full range of motion and good stability and symmetric strength and tone of shoulders, elbows, wrist, hip, knee and ankles bilaterally.  Patient does have area on the 11th rib on the right side that is significantly less tender than previous exam.     Impression and Recommendations:     This case required medical decision making of moderate complexity.

## 2014-04-09 NOTE — Patient Instructions (Signed)
Good to see you Thanks for the good report See me when you need me.  We can repeat injection if needed in 3 months.

## 2014-04-15 LAB — HM DIABETES EYE EXAM

## 2014-04-15 LAB — HM DIABETES FOOT EXAM

## 2014-05-07 LAB — SURGICAL PATHOLOGY

## 2014-05-15 ENCOUNTER — Encounter: Payer: Self-pay | Admitting: Internal Medicine

## 2014-05-15 ENCOUNTER — Ambulatory Visit (INDEPENDENT_AMBULATORY_CARE_PROVIDER_SITE_OTHER): Payer: Medicare Other | Admitting: Internal Medicine

## 2014-05-15 VITALS — BP 100/64 | HR 70 | Temp 98.2°F | Ht 70.5 in | Wt 221.0 lb

## 2014-05-15 DIAGNOSIS — E119 Type 2 diabetes mellitus without complications: Secondary | ICD-10-CM

## 2014-05-15 DIAGNOSIS — E785 Hyperlipidemia, unspecified: Secondary | ICD-10-CM | POA: Diagnosis not present

## 2014-05-15 DIAGNOSIS — R0789 Other chest pain: Secondary | ICD-10-CM

## 2014-05-15 DIAGNOSIS — I10 Essential (primary) hypertension: Secondary | ICD-10-CM | POA: Diagnosis not present

## 2014-05-15 DIAGNOSIS — H66009 Acute suppurative otitis media without spontaneous rupture of ear drum, unspecified ear: Secondary | ICD-10-CM | POA: Insufficient documentation

## 2014-05-15 DIAGNOSIS — H66003 Acute suppurative otitis media without spontaneous rupture of ear drum, bilateral: Secondary | ICD-10-CM

## 2014-05-15 LAB — COMPREHENSIVE METABOLIC PANEL
ALBUMIN: 4.3 g/dL (ref 3.5–5.2)
ALT: 14 U/L (ref 0–53)
AST: 15 U/L (ref 0–37)
Alkaline Phosphatase: 53 U/L (ref 39–117)
BILIRUBIN TOTAL: 0.6 mg/dL (ref 0.2–1.2)
BUN: 16 mg/dL (ref 6–23)
CO2: 31 mEq/L (ref 19–32)
Calcium: 9.8 mg/dL (ref 8.4–10.5)
Chloride: 96 mEq/L (ref 96–112)
Creatinine, Ser: 0.87 mg/dL (ref 0.40–1.50)
GFR: 94.97 mL/min (ref >60.00)
Glucose, Bld: 141 mg/dL — ABNORMAL HIGH (ref 70–99)
Potassium: 4 mEq/L (ref 3.5–5.1)
SODIUM: 134 meq/L — AB (ref 135–145)
Total Protein: 7.3 g/dL (ref 6.0–8.3)

## 2014-05-15 LAB — MICROALBUMIN / CREATININE URINE RATIO
CREATININE, U: 153.3 mg/dL
MICROALB/CREAT RATIO: 1.8 mg/g (ref 0.0–30.0)
Microalb, Ur: 2.7 mg/dL — ABNORMAL HIGH (ref 0.0–1.9)

## 2014-05-15 LAB — LDL CHOLESTEROL, DIRECT: Direct LDL: 67 mg/dL

## 2014-05-15 LAB — LIPID PANEL
CHOL/HDL RATIO: 5
CHOLESTEROL: 141 mg/dL (ref 0–200)
HDL: 28.7 mg/dL — ABNORMAL LOW (ref >39.00)
NonHDL: 112.3
Triglycerides: 292 mg/dL — ABNORMAL HIGH (ref 0.0–149.0)
VLDL: 58.4 mg/dL — AB (ref 0.0–40.0)

## 2014-05-15 LAB — HEMOGLOBIN A1C: HEMOGLOBIN A1C: 6.4 % (ref 4.6–6.5)

## 2014-05-15 MED ORDER — AMOXICILLIN-POT CLAVULANATE 875-125 MG PO TABS
1.0000 | ORAL_TABLET | Freq: Two times a day (BID) | ORAL | Status: DC
Start: 2014-05-15 — End: 2014-06-15

## 2014-05-15 NOTE — Assessment & Plan Note (Signed)
BP Readings from Last 3 Encounters:  05/15/14 100/64  04/09/14 102/60  03/20/14 128/60   BP well controlled. Renal function with labs.

## 2014-05-15 NOTE — Assessment & Plan Note (Signed)
Exam is c/w bilateral OM. Will start Augmentin bid. Encouraged use of probiotic while on Augmentin. Follow up recheck in 4 weeks.

## 2014-05-15 NOTE — Assessment & Plan Note (Signed)
Will check lipids and LFTs with labs. Continue Atorvastatin. 

## 2014-05-15 NOTE — Progress Notes (Signed)
Pre visit review using our clinic review tool, if applicable. No additional management support is needed unless otherwise documented below in the visit note. 

## 2014-05-15 NOTE — Progress Notes (Addendum)
Subjective:    Patient ID: Walter Salas, male    DOB: [birth year], 59 y.o.   MRN: [id number]  HPI  59YO male presents for follow up.  DM - BG mostly 130-140, only 2 sugars over 200 after indulging in sweets. In general, follows a healthy diet. Exercise limited by pain.  Chronic pain - Taking Tramadol for back pain from osteoarthritis. Typically takes twice per day. Much worse during winter months. Recently well controlled. Also takes Lyrica for neuropathy which some improvement. Feels like "needles" in feet if he does not take this medication. Had trouble walking because of pain in the past, but this has resolved.  CAD - No recent chest pain. No recent use of NTG.  Congestion - Notes persistent nasal congestion and bilateral ear pain over last several weeks. Taking prn antihistamines and using nasal saline wash with no improvement. No fever, chills. Notes maxillary sinus pressure and pain. Occasional nasal drainage. No cough, dyspnea.  BP Readings from Last 3 Encounters:  05/15/14 100/64  04/09/14 102/60  03/20/14 128/60   Wt Readings from Last 3 Encounters:  05/15/14 221 lb (100.245 kg)  04/09/14 226 lb (102.513 kg)  03/20/14 226 lb (102.513 kg)     Past medical, surgical, family and social history per today's encounter.  Review of Systems  Constitutional: Negative for fever, chills, activity change, appetite change, fatigue and unexpected weight change.  HENT: Positive for congestion, ear pain, rhinorrhea, sinus pressure and sneezing. Negative for postnasal drip, sore throat, trouble swallowing and voice change.   Eyes: Negative for visual disturbance.  Respiratory: Negative for cough, shortness of breath and wheezing.   Cardiovascular: Negative for chest pain, palpitations and leg swelling.  Gastrointestinal: Negative for abdominal pain and abdominal distention.  Genitourinary: Negative for dysuria, urgency and difficulty urinating.  Musculoskeletal: Negative for  arthralgias and gait problem.  Skin: Negative for color change and rash.  Hematological: Negative for adenopathy.  Psychiatric/Behavioral: Negative for sleep disturbance and dysphoric mood. The patient is not nervous/anxious.        Objective:    BP 100/64 mmHg  Pulse 70  Temp(Src) 98.2 F (36.8 C) (Oral)  Ht 5' 10.5" (1.791 m)  Wt 221 lb (100.245 kg)  BMI 31.25 kg/m2  SpO2 96% Physical Exam  Constitutional: He is oriented to person, place, and time. He appears well-developed and well-nourished. No distress.  HENT:  Head: Normocephalic and atraumatic.  Right Ear: External ear normal. Tympanic membrane is erythematous and bulging. A middle ear effusion is present.  Left Ear: External ear normal. Tympanic membrane is erythematous and bulging. A middle ear effusion is present.  Nose: Nose normal.  Mouth/Throat: Oropharynx is clear and moist. No oropharyngeal exudate.  Eyes: Conjunctivae and EOM are normal. Pupils are equal, round, and reactive to light. Right eye exhibits no discharge. Left eye exhibits no discharge. No scleral icterus.  Neck: Normal range of motion. Neck supple. No tracheal deviation present. No thyromegaly present.  Cardiovascular: Normal rate, regular rhythm and normal heart sounds.  Exam reveals no gallop and no friction rub.   No murmur heard. Pulmonary/Chest: Effort normal and breath sounds normal. No accessory muscle usage. No tachypnea. No respiratory distress. He has no decreased breath sounds. He has no wheezes. He has no rhonchi. He has no rales. He exhibits no tenderness.  Musculoskeletal: Normal range of motion. He exhibits no edema.  Lymphadenopathy:    He has no cervical adenopathy.  Neurological: He is alert and oriented to person, place,  and time. No cranial nerve deficit. Coordination normal.  Skin: Skin is warm and dry. No rash noted. He is not diaphoretic. No erythema. No pallor.  Psychiatric: He has a normal mood and affect. His behavior is  normal. Judgment and thought content normal.          Assessment & Plan:   Problem List Items Addressed This Visit      Unprioritized   Acute purulent otitis media    Exam is c/w bilateral OM. Will start Augmentin bid. Encouraged use of probiotic while on Augmentin. Follow up recheck in 4 weeks.      Relevant Medications   amoxicillin-clavulanate (AUGMENTIN) 875-125 MG per tablet   Chest wall pain    Chronic chest wall pain well controlled with use of prn Tramadol. Will continue.      Diabetes mellitus, type 2 - Primary    BG well controlled. Will check A1c with labs. Continue current medications.       Relevant Orders   Comprehensive metabolic panel   Hemoglobin A1c   Lipid panel   Microalbumin / creatinine urine ratio   Essential hypertension, benign    BP Readings from Last 3 Encounters:  05/15/14 100/64  04/09/14 102/60  03/20/14 128/60   BP well controlled. Renal function with labs.      Hyperlipidemia    Will check lipids and LFTs with labs. Continue Atorvastatin.          Return in about 4 weeks (around 06/12/2014) for Recheck.

## 2014-05-15 NOTE — Assessment & Plan Note (Signed)
BG well controlled. Will check A1c with labs. Continue current medications. 

## 2014-05-15 NOTE — Assessment & Plan Note (Signed)
Chronic chest wall pain well controlled with use of prn Tramadol. Will continue.

## 2014-05-15 NOTE — Patient Instructions (Addendum)
Labs today.  Start Augmentin twice daily to treat ear infection. Recheck in 4 weeks.

## 2014-06-15 ENCOUNTER — Encounter: Payer: Self-pay | Admitting: Internal Medicine

## 2014-06-15 ENCOUNTER — Ambulatory Visit (INDEPENDENT_AMBULATORY_CARE_PROVIDER_SITE_OTHER): Payer: Medicare Other | Admitting: Internal Medicine

## 2014-06-15 VITALS — BP 100/60 | HR 70 | Temp 98.0°F | Ht 70.0 in | Wt 219.5 lb

## 2014-06-15 DIAGNOSIS — H66003 Acute suppurative otitis media without spontaneous rupture of ear drum, bilateral: Secondary | ICD-10-CM

## 2014-06-15 DIAGNOSIS — E119 Type 2 diabetes mellitus without complications: Secondary | ICD-10-CM

## 2014-06-15 NOTE — Progress Notes (Signed)
Pre visit review using our clinic review tool, if applicable. No additional management support is needed unless otherwise documented below in the visit note. 

## 2014-06-15 NOTE — Assessment & Plan Note (Signed)
Symptoms have resolved. Exam normal. Will monitor for any recurrent symptoms.

## 2014-06-15 NOTE — Assessment & Plan Note (Signed)
BG very well controlled with A1c of 6.4%.  Will plan recheck in 6 months.

## 2014-06-15 NOTE — Progress Notes (Signed)
   Subjective:    Patient ID: Walter Salas, male    DOB: 06-07-1955, 59 y.o.   MRN: [id number]  HPI  59YO male presents for follow up.  Last seen for bilateral OM in 05/2014.   No ear pain, fever, chills, cough.  Feeling well.  BG have been well controlled, typically near 110.  Past medical, surgical, family and social history per today's encounter.  Review of Systems  Constitutional: Negative for fever, chills, activity change and fatigue.  HENT: Negative for congestion, ear discharge, ear pain, hearing loss, nosebleeds, postnasal drip, rhinorrhea, sinus pressure, sneezing, sore throat, tinnitus, trouble swallowing and voice change.   Eyes: Negative for discharge, redness, itching and visual disturbance.  Respiratory: Negative for cough, chest tightness, shortness of breath, wheezing and stridor.   Cardiovascular: Negative for chest pain and leg swelling.  Musculoskeletal: Negative for myalgias, arthralgias, neck pain and neck stiffness.  Skin: Negative for color change and rash.  Neurological: Negative for dizziness, facial asymmetry and headaches.  Psychiatric/Behavioral: Negative for sleep disturbance.       Objective:    BP 100/60 mmHg  Pulse 70  Temp(Src) 98 F (36.7 C) (Oral)  Ht 5\' 10"  (1.778 m)  Wt 219 lb 8 oz (99.565 kg)  BMI 31.50 kg/m2  SpO2 96% Physical Exam  Constitutional: He is oriented to person, place, and time. He appears well-developed and well-nourished. No distress.  HENT:  Head: Normocephalic and atraumatic.  Right Ear: External ear normal.  Left Ear: External ear normal.  Nose: Nose normal.  Mouth/Throat: Oropharynx is clear and moist. No oropharyngeal exudate.  Eyes: Conjunctivae and EOM are normal. Pupils are equal, round, and reactive to light. Right eye exhibits no discharge. Left eye exhibits no discharge. No scleral icterus.  Neck: Normal range of motion. Neck supple. No tracheal deviation present. No thyromegaly present.    Cardiovascular: Normal rate, regular rhythm and normal heart sounds.  Exam reveals no gallop and no friction rub.   No murmur heard. Pulmonary/Chest: Effort normal and breath sounds normal. No accessory muscle usage. No tachypnea. No respiratory distress. He has no decreased breath sounds. He has no wheezes. He has no rhonchi. He has no rales. He exhibits no tenderness.  Musculoskeletal: Normal range of motion. He exhibits no edema.  Lymphadenopathy:    He has no cervical adenopathy.  Neurological: He is alert and oriented to person, place, and time. No cranial nerve deficit. Coordination normal.  Skin: Skin is warm and dry. No rash noted. He is not diaphoretic. No erythema. No pallor.  Psychiatric: He has a normal mood and affect. His behavior is normal. Judgment and thought content normal.          Assessment & Plan:   Problem List Items Addressed This Visit      Unprioritized   Acute purulent otitis media - Primary    Symptoms have resolved. Exam normal. Will monitor for any recurrent symptoms.      Diabetes mellitus, type 2    BG very well controlled with A1c of 6.4%.  Will plan recheck in 6 months.          Return in about 5 months (around 11/15/2014) for Recheck of Diabetes.

## 2014-10-24 ENCOUNTER — Ambulatory Visit (INDEPENDENT_AMBULATORY_CARE_PROVIDER_SITE_OTHER): Payer: Medicare Other | Admitting: Internal Medicine

## 2014-10-24 ENCOUNTER — Encounter: Payer: Self-pay | Admitting: Internal Medicine

## 2014-10-24 VITALS — BP 107/68 | HR 75 | Temp 98.1°F | Ht 69.75 in | Wt 218.5 lb

## 2014-10-24 DIAGNOSIS — Z1283 Encounter for screening for malignant neoplasm of skin: Secondary | ICD-10-CM | POA: Insufficient documentation

## 2014-10-24 DIAGNOSIS — Z125 Encounter for screening for malignant neoplasm of prostate: Secondary | ICD-10-CM | POA: Diagnosis not present

## 2014-10-24 DIAGNOSIS — I10 Essential (primary) hypertension: Secondary | ICD-10-CM

## 2014-10-24 DIAGNOSIS — Z0000 Encounter for general adult medical examination without abnormal findings: Secondary | ICD-10-CM | POA: Diagnosis not present

## 2014-10-24 DIAGNOSIS — E119 Type 2 diabetes mellitus without complications: Secondary | ICD-10-CM

## 2014-10-24 DIAGNOSIS — Z0100 Encounter for examination of eyes and vision without abnormal findings: Secondary | ICD-10-CM

## 2014-10-24 LAB — HEMOGLOBIN A1C: Hgb A1c MFr Bld: 6.6 % — ABNORMAL HIGH (ref 4.6–6.5)

## 2014-10-24 LAB — LIPID PANEL
CHOL/HDL RATIO: 4
Cholesterol: 123 mg/dL (ref 0–200)
HDL: 29.3 mg/dL — AB (ref >39.00)
LDL CALC: 60 mg/dL (ref 0–99)
NonHDL: 94.02
TRIGLYCERIDES: 172 mg/dL — AB (ref 0.0–149.0)
VLDL: 34.4 mg/dL (ref 0.0–40.0)

## 2014-10-24 LAB — COMPREHENSIVE METABOLIC PANEL
ALT: 15 U/L (ref 0–53)
AST: 15 U/L (ref 0–37)
Albumin: 4.4 g/dL (ref 3.5–5.2)
Alkaline Phosphatase: 56 U/L (ref 39–117)
BILIRUBIN TOTAL: 0.6 mg/dL (ref 0.2–1.2)
BUN: 16 mg/dL (ref 6–23)
CO2: 29 meq/L (ref 19–32)
Calcium: 9.5 mg/dL (ref 8.4–10.5)
Chloride: 97 mEq/L (ref 96–112)
Creatinine, Ser: 0.86 mg/dL (ref 0.40–1.50)
GFR: 96.1 mL/min (ref >60.00)
GLUCOSE: 105 mg/dL — AB (ref 70–99)
Potassium: 3.6 mEq/L (ref 3.5–5.1)
Sodium: 138 mEq/L (ref 135–145)
Total Protein: 7 g/dL (ref 6.0–8.3)

## 2014-10-24 LAB — CBC WITH DIFFERENTIAL/PLATELET
BASOS PCT: 0.6 % (ref 0.0–3.0)
Basophils Absolute: 0 10*3/uL (ref 0.0–0.1)
EOS PCT: 2.1 % (ref 0.0–5.0)
Eosinophils Absolute: 0.2 10*3/uL (ref 0.0–0.7)
HCT: 43.7 % (ref 39.0–52.0)
Hemoglobin: 14.5 g/dL (ref 13.0–17.0)
LYMPHS ABS: 1.5 10*3/uL (ref 0.7–4.0)
Lymphocytes Relative: 18.1 % (ref 12.0–46.0)
MCHC: 33.2 g/dL (ref 30.0–36.0)
MCV: 83.8 fl (ref 78.0–100.0)
MONOS PCT: 6.4 % (ref 3.0–12.0)
Monocytes Absolute: 0.5 10*3/uL (ref 0.1–1.0)
NEUTROS ABS: 5.9 10*3/uL (ref 1.4–7.7)
NEUTROS PCT: 72.8 % (ref 43.0–77.0)
PLATELETS: 238 10*3/uL (ref 150.0–400.0)
RBC: 5.22 Mil/uL (ref 4.22–5.81)
RDW: 14.9 % (ref 11.5–15.5)
WBC: 8.1 10*3/uL (ref 4.0–10.5)

## 2014-10-24 LAB — MICROALBUMIN / CREATININE URINE RATIO
Creatinine,U: 166.7 mg/dL
Microalb Creat Ratio: 1.1 mg/g (ref 0.0–30.0)
Microalb, Ur: 1.8 mg/dL (ref 0.0–1.9)

## 2014-10-24 LAB — PSA, MEDICARE: PSA: 0.19 ng/mL (ref 0.10–4.00)

## 2014-10-24 NOTE — Assessment & Plan Note (Signed)
Check A1c with labs today

## 2014-10-24 NOTE — Progress Notes (Signed)
The patient is here for annual Medicare Wellness Examination and management of other chronic and acute problems.   The risk factors are reflected in the history.  The roster of all physicians providing medical care to patient - is listed in the Snapshot section of the chart.  Activities of daily living:   The patient is 100% independent in all ADLs: dressing, toileting, feeding as well as independent mobility. Patient lives with wife and cat in a two story home. Mostly stays on first floor.  Has had a few falls, which he attributes to medication. Followed at [facility]. No major injuries over last year.  Home safety :  The patient has smoke detectors in the home.  They wear seatbelts in their car. There are no firearms at home.  There is no violence in the home. They feel safe where they live.  Infectious Risks: There is no risks for hepatitis, STDs or HIV.  There is no history of blood transfusion.  They is a travel history to infectious disease endemic areas of the world [place], [place], [place], [place].  Additional Health Care Providers: The patient has not seen their dentist in the last six months. Dentist - none They have seen their eye doctor in the last year. Told he has cataracts. Opthalmologist - [place] They deny hearing issues. They have deferred audiologic testing in the last year.   They do not  have excessive sun exposure. Discussed the need for sun protection: hats,long sleeves and use of sunscreen if there is significant sun exposure.  Dermatologist - none   Diet: the importance of a healthy diet is discussed. They do have a healthy diet.  The benefits of regular aerobic exercise were discussed. Patient exercises by walking.  Depression screen: there are no signs or vegative symptoms of depression- irritability, change in appetite, anhedonia, sadness/tearfullness.  Cognitive assessment: the patient manages all their financial and personal affairs and is  actively engaged. They could relate day,date,year and events.  HCPOA - wife Living Will - yes, on record at [place]  The following portions of the patient's history were reviewed and updated as appropriate: allergies, current medications, past family history, past medical history,  past surgical history, past social history and problem list.  Visual acuity was not assessed per patient preference as they have regular follow up with their ophthalmologist. Hearing and body mass index were assessed and reviewed.   During the course of the visit the patient was educated and counseled about appropriate screening and preventive services including : fall prevention , diabetes screening, nutrition counseling, colorectal cancer screening, and recommended immunizations.    Past Medical History  Diagnosis Date  . Arthritis   . Depression   . Diabetes mellitus without complication   . Ulcer   . Hyperlipidemia   . Kidney stone   . Colon polyp   . Hx of laminectomy 1999  . CAD (coronary artery disease)    Family History  Problem Relation Age of Onset  . Diabetes Mother   . Heart disease Father   . Diabetes Father   . Diabetes Sister   . Heart disease Sister   . Diabetes Brother   . Heart disease Paternal Grandfather    Social History   Social History  . Marital Status: Married    Spouse Name: N/A  . Number of Children: N/A  . Years of Education: N/A   Social History Main Topics  . Smoking status: Current Every Day Smoker -- 0.33  packs/day for 40 years    Types: Cigarettes  . Smokeless tobacco: Never Used  . Alcohol Use: No  . Drug Use: No  . Sexual Activity: Not on file   Other Topics Concern  . Not on file   Social History Narrative   Lives in [place] with wife. Has cat.   Work - Database administrator, retired      Followed at [employer].      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      Diet - Regular   Exercise - starting back at gym   Past Surgical History   Procedure Laterality Date  . Coronary angioplasty with stent placement  2014    [facility] [employer]  . Shoulder arthroscopy Right 2012  . Cervical fusion  2000. 2003  . Back surgery  1993, 1994, 1997, 1998  . Carpal tunnel release Bilateral 1990  . Cholecystectomy  2000  . Tumor excision  2007    Review of Systems  Constitutional: Negative for fever, chills, activity change, appetite change, fatigue and unexpected weight change.  Eyes: Negative for visual disturbance.  Respiratory: Negative for cough and shortness of breath.   Cardiovascular: Negative for chest pain, palpitations and leg swelling.  Gastrointestinal: Negative for nausea, vomiting, abdominal pain, diarrhea, constipation, blood in stool and abdominal distention.  Genitourinary: Positive for dysuria (this weekend now resolved). Negative for urgency, frequency, hematuria, decreased urine volume and difficulty urinating.  Musculoskeletal: Negative for arthralgias and gait problem.  Skin: Negative for color change and rash.  Hematological: Negative for adenopathy.  Psychiatric/Behavioral: Negative for suicidal ideas, sleep disturbance and dysphoric mood. The patient is not nervous/anxious.        Objective:    BP 107/68 mmHg  Pulse 75  Temp(Src) 98.1 F (36.7 C) (Oral)  Ht 5' 9.75" (1.772 m)  Wt 218 lb 8 oz (99.111 kg)  BMI 31.56 kg/m2  SpO2 95% Physical Exam  Constitutional: He is oriented to person, place, and time. He appears well-developed and well-nourished. No distress.  HENT:  Head: Normocephalic and atraumatic.  Right Ear: External ear normal.  Left Ear: External ear normal.  Nose: Nose normal.  Mouth/Throat: Oropharynx is clear and moist. No oropharyngeal exudate.  Eyes: Conjunctivae and EOM are normal. Pupils are equal, round, and reactive to light. Right eye exhibits no discharge. Left eye exhibits no discharge. No scleral icterus.  Neck: Normal range of motion. Neck supple.  No tracheal deviation present. No thyromegaly present.  Cardiovascular: Normal rate, regular rhythm and normal heart sounds.  Exam reveals no gallop and no friction rub.   No murmur heard. Pulmonary/Chest: Effort normal and breath sounds normal. No accessory muscle usage. No tachypnea. No respiratory distress. He has no decreased breath sounds. He has no wheezes. He has no rhonchi. He has no rales. He exhibits no tenderness.  Abdominal: Soft. Bowel sounds are normal. He exhibits no distension and no mass. There is no tenderness. There is no rebound and no guarding.  Musculoskeletal: Normal range of motion. He exhibits no edema.  Lymphadenopathy:    He has no cervical adenopathy.  Neurological: He is alert and oriented to person, place, and time. No cranial nerve deficit. Coordination normal.  Skin: Skin is warm and dry. No rash noted. He is not diaphoretic. No erythema. No pallor.  Psychiatric: He has a normal mood and affect. His behavior is normal. Judgment and thought content normal.  Assessment & Plan:  Patient was given a handout regarding current recommendations for health maintenance and preventative care on the AVS.  Problem List Items Addressed This Visit      Unprioritized   Diabetes mellitus, type 2 ([place])    Check A1c with labs today.      Relevant Orders   Comprehensive metabolic panel   Hemoglobin A1c   Lipid panel   Microalbumin / creatinine urine ratio   Diabetic eye exam [facility])    Referral placed for eye exam.      Relevant Orders   Ambulatory referral to Ophthalmology   Essential hypertension, benign    Renal function with labs today.      Relevant Orders   POCT Urinalysis Dipstick   Medicare annual wellness visit, initial - Primary    General medical exam normal today. Colonoscopy UTD. Discussed screening for prostate cancer with PSA, and reviewed limitations of testing. Immunizations are UTD. Labs as ordered. Encouraged healthy diet and exercise.        Relevant Orders   PSA, Medicare   CBC with Differential/Platelet   Screening for skin cancer   Relevant Orders   Ambulatory referral to Dermatology       Return in about 3 months (around 01/24/2015) for Recheck of Diabetes.

## 2014-10-24 NOTE — Progress Notes (Signed)
Pre visit review using our clinic review tool, if applicable. No additional management support is needed unless otherwise documented below in the visit note. 

## 2014-10-24 NOTE — Patient Instructions (Signed)

## 2014-10-24 NOTE — Assessment & Plan Note (Signed)
Referral placed for eye exam

## 2014-10-24 NOTE — Assessment & Plan Note (Signed)
Renal function with labs today.  

## 2014-10-24 NOTE — Assessment & Plan Note (Signed)
General medical exam normal today. Colonoscopy UTD. Discussed screening for prostate cancer with PSA, and reviewed limitations of testing. Immunizations are UTD. Labs as ordered. Encouraged healthy diet and exercise.

## 2014-11-08 DIAGNOSIS — Z1283 Encounter for screening for malignant neoplasm of skin: Secondary | ICD-10-CM | POA: Diagnosis not present

## 2014-11-08 DIAGNOSIS — L728 Other follicular cysts of the skin and subcutaneous tissue: Secondary | ICD-10-CM | POA: Diagnosis not present

## 2014-11-08 DIAGNOSIS — D485 Neoplasm of uncertain behavior of skin: Secondary | ICD-10-CM | POA: Diagnosis not present

## 2014-11-08 DIAGNOSIS — L570 Actinic keratosis: Secondary | ICD-10-CM | POA: Diagnosis not present

## 2014-11-08 DIAGNOSIS — D171 Benign lipomatous neoplasm of skin and subcutaneous tissue of trunk: Secondary | ICD-10-CM | POA: Diagnosis not present

## 2014-11-15 ENCOUNTER — Ambulatory Visit (INDEPENDENT_AMBULATORY_CARE_PROVIDER_SITE_OTHER): Payer: Medicare Other | Admitting: Internal Medicine

## 2014-11-15 ENCOUNTER — Encounter: Payer: Self-pay | Admitting: Internal Medicine

## 2014-11-15 VITALS — BP 100/56 | HR 66 | Temp 97.9°F | Ht 69.75 in | Wt 219.0 lb

## 2014-11-15 DIAGNOSIS — M25561 Pain in right knee: Secondary | ICD-10-CM | POA: Insufficient documentation

## 2014-11-15 DIAGNOSIS — I10 Essential (primary) hypertension: Secondary | ICD-10-CM | POA: Diagnosis not present

## 2014-11-15 DIAGNOSIS — M25562 Pain in left knee: Secondary | ICD-10-CM

## 2014-11-15 DIAGNOSIS — E119 Type 2 diabetes mellitus without complications: Secondary | ICD-10-CM

## 2014-11-15 MED ORDER — VITAMIN B-12 1000 MCG PO TABS: 1000.0000 ug | ORAL_TABLET | Freq: Every day | ORAL | Status: AC

## 2014-11-15 MED ORDER — ATORVASTATIN CALCIUM 80 MG PO TABS: 80.0000 mg | ORAL_TABLET | Freq: Every day | ORAL | Status: DC

## 2014-11-15 MED ORDER — TRAZODONE HCL 100 MG PO TABS: 300.0000 mg | ORAL_TABLET | Freq: Every evening | ORAL | Status: DC | PRN

## 2014-11-15 MED ORDER — FENOFIBRATE 145 MG PO TABS: 145.0000 mg | ORAL_TABLET | Freq: Every day | ORAL | Status: DC

## 2014-11-15 MED ORDER — TRAMADOL HCL 50 MG PO TABS: 100.0000 mg | ORAL_TABLET | Freq: Three times a day (TID) | ORAL | Status: DC | PRN

## 2014-11-15 MED ORDER — VITAMIN D 1000 UNITS PO TABS: 1000.0000 [IU] | ORAL_TABLET | Freq: Four times a day (QID) | ORAL | Status: DC

## 2014-11-15 NOTE — Assessment & Plan Note (Signed)
BP Readings from Last 3 Encounters:  11/15/14 100/56  10/24/14 107/68  06/15/14 100/60   BP well controlled. Renal function with labs in 10/2014 normal. Continue current medication.

## 2014-11-15 NOTE — Assessment & Plan Note (Signed)
Knee pain likely secondary to OA. Avoiding NSAIDS at recommendation of his cardiologist. Will continue prn Tramadol. Discussed referral to ortho for evaluation and possible intra-articular steroid injection. He would like to hold off for now.

## 2014-11-15 NOTE — Assessment & Plan Note (Signed)
Lab Results  Component Value Date   HGBA1C 6.6* 10/24/2014   BG very well controlled. Plan repeat A1c in 6 months. Continue Metformin.

## 2014-11-15 NOTE — Progress Notes (Signed)
Pre visit review using our clinic review tool, if applicable. No additional management support is needed unless otherwise documented below in the visit note. 

## 2014-11-15 NOTE — Patient Instructions (Signed)
Please let us know if knee pain worsening, and we can set up evaluation with orthopedics.  Follow up in 6 months.

## 2014-11-15 NOTE — Progress Notes (Signed)
Subjective:    Patient ID: Walter Salas, male    DOB: 12-24-1955, 59 y.o.   MRN: [id number]  HPI  59YO male presents for followup.  DM - Feeling well. Compliant with medication. Last A1c 6.6%  HTN - Compliant with medication. No recent CP, HA, palpitations.  Knee pain - Bilateral but worse in left. Ongoing for about 1 year. Gradually getting worse. Described as throbbing pain. Occasional swelling. Made worse with weight bearing. Uses Tramadol for other back pain, but not knee pain. Some improvement with this. Previously used Meloxicam, but taken off of medication by Cardiology.   Wt Readings from Last 3 Encounters:  11/15/14 219 lb (99.338 kg)  10/24/14 218 lb 8 oz (99.111 kg)  06/15/14 219 lb 8 oz (99.565 kg)   BP Readings from Last 3 Encounters:  11/15/14 100/56  10/24/14 107/68  06/15/14 100/60    Past Medical History  Diagnosis Date  . Arthritis   . Depression   . Diabetes mellitus without complication ([place])   . Ulcer   . Hyperlipidemia   . Kidney stone   . Colon polyp   . Hx of laminectomy 1999  . CAD (coronary artery disease)    Family History  Problem Relation Age of Onset  . Diabetes Mother   . Heart disease Father   . Diabetes Father   . Diabetes Sister   . Heart disease Sister   . Diabetes Brother   . Heart disease Paternal Grandfather    Past Surgical History  Procedure Laterality Date  . Coronary angioplasty with stent placement  2014    [facility]  . Shoulder arthroscopy Right 2012  . Cervical fusion  2000. 2003  . Back surgery  1993, 1994, 1997, 1998  . Carpal tunnel release Bilateral 1990  . Cholecystectomy  2000  . Tumor excision  2007   Social History   Social History  . Marital Status: Married    Spouse Name: N/A  . Number of Children: N/A  . Years of Education: N/A   Social History Main Topics  . Smoking status: Current Every Day Smoker -- 0.33 packs/day for 40 years    Types: Cigarettes    . Smokeless tobacco: Never Used  . Alcohol Use: No  . Drug Use: No  . Sexual Activity: Not Asked   Other Topics Concern  . None   Social History Narrative   Lives in [place] with wife. Has cat.   Work - Database administrator, retired      Followed at [employer].      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      Diet - Regular   Exercise - starting back at gym    Review of Systems  Constitutional: Negative for fever, chills, activity change, appetite change, fatigue and unexpected weight change.  Eyes: Negative for visual disturbance.  Respiratory: Negative for cough and shortness of breath.   Cardiovascular: Negative for chest pain, palpitations and leg swelling.  Gastrointestinal: Negative for nausea, vomiting, abdominal pain, diarrhea, constipation and abdominal distention.  Genitourinary: Negative for dysuria, urgency and difficulty urinating.  Musculoskeletal: Positive for joint swelling and arthralgias. Negative for myalgias and gait problem.  Skin: Negative for color change and rash.  Hematological: Negative for adenopathy.  Psychiatric/Behavioral: Negative for sleep disturbance and dysphoric mood. The patient is not nervous/anxious.        Objective:    BP 100/56 mmHg  Pulse 66  Temp(Src) 97.9 F (36.6 C) (Oral)  Ht 5' 9.75" (1.772 m)  Wt 219 lb (99.338 kg)  BMI 31.64 kg/m2  SpO2 96% Physical Exam  Constitutional: He is oriented to person, place, and time. He appears well-developed and well-nourished. No distress.  HENT:  Head: Normocephalic and atraumatic.  Right Ear: External ear normal.  Left Ear: External ear normal.  Nose: Nose normal.  Mouth/Throat: Oropharynx is clear and moist. No oropharyngeal exudate.  Eyes: Conjunctivae and EOM are normal. Pupils are equal, round, and reactive to light. Right eye exhibits no discharge. Left eye exhibits no discharge. No scleral icterus.  Neck: Normal range of motion. Neck supple. No tracheal  deviation present. No thyromegaly present.  Cardiovascular: Normal rate, regular rhythm and normal heart sounds.  Exam reveals no gallop and no friction rub.   No murmur heard. Pulmonary/Chest: Effort normal and breath sounds normal. No accessory muscle usage. No tachypnea. No respiratory distress. He has no decreased breath sounds. He has no wheezes. He has no rhonchi. He has no rales. He exhibits no tenderness.  Musculoskeletal: Normal range of motion. He exhibits no edema.       Right knee: He exhibits normal range of motion and no swelling.       Left knee: He exhibits normal range of motion and no swelling.  Lymphadenopathy:    He has no cervical adenopathy.  Neurological: He is alert and oriented to person, place, and time. No cranial nerve deficit. Coordination normal.  Skin: Skin is warm and dry. No rash noted. He is not diaphoretic. No erythema. No pallor.  Psychiatric: He has a normal mood and affect. His behavior is normal. Judgment and thought content normal.          Assessment & Plan:   Problem List Items Addressed This Visit      Unprioritized   Diabetes mellitus, type 2 ([place])    Lab Results  Component Value Date   HGBA1C 6.6* 10/24/2014   BG very well controlled. Plan repeat A1c in 6 months. Continue Metformin.      Relevant Medications   atorvastatin (LIPITOR) 80 MG tablet   Essential hypertension, benign    BP Readings from Last 3 Encounters:  11/15/14 100/56  10/24/14 107/68  06/15/14 100/60   BP well controlled. Renal function with labs in 10/2014 normal. Continue current medication.      Relevant Medications   atorvastatin (LIPITOR) 80 MG tablet   fenofibrate (TRICOR) 145 MG tablet   Knee pain, bilateral - Primary    Knee pain likely secondary to OA. Avoiding NSAIDS at recommendation of his cardiologist. Will continue prn Tramadol. Discussed referral to ortho for evaluation and possible intra-articular steroid injection. He would like to hold off for  now.          Return in about 6 months (around 05/15/2015) for Recheck of Diabetes.

## 2014-12-10 DIAGNOSIS — D225 Melanocytic nevi of trunk: Secondary | ICD-10-CM | POA: Diagnosis not present

## 2014-12-10 DIAGNOSIS — D485 Neoplasm of uncertain behavior of skin: Secondary | ICD-10-CM | POA: Diagnosis not present

## 2014-12-14 DIAGNOSIS — E119 Type 2 diabetes mellitus without complications: Secondary | ICD-10-CM | POA: Diagnosis not present

## 2015-01-28 ENCOUNTER — Ambulatory Visit: Payer: Medicare Other | Admitting: Internal Medicine

## 2015-01-30 ENCOUNTER — Ambulatory Visit (INDEPENDENT_AMBULATORY_CARE_PROVIDER_SITE_OTHER): Payer: Medicare Other | Admitting: Family Medicine

## 2015-01-30 ENCOUNTER — Encounter: Payer: Self-pay | Admitting: Family Medicine

## 2015-01-30 ENCOUNTER — Telehealth: Payer: Self-pay | Admitting: Internal Medicine

## 2015-01-30 VITALS — BP 110/62 | HR 69 | Temp 98.2°F | Wt 230.2 lb

## 2015-01-30 DIAGNOSIS — J0100 Acute maxillary sinusitis, unspecified: Secondary | ICD-10-CM

## 2015-01-30 MED ORDER — AMOXICILLIN-POT CLAVULANATE 875-125 MG PO TABS: 1.0000 | ORAL_TABLET | Freq: Two times a day (BID) | ORAL | Status: DC

## 2015-01-30 NOTE — Telephone Encounter (Signed)
Pt called. He needs his prescription that Dr. Caryl Bis prescribed to him today. Pharmacy is [facility] on [address]. Pt can be reached at home.

## 2015-01-30 NOTE — Assessment & Plan Note (Signed)
Suspect bacterial sinusitis given duration and location of symptoms. Suspect the blood-tinged mucus is from nasal irritation. Normal lung exam. Normal vitals. We'll treat with Augmentin. He'll start Flonase. He can continue nasal rinses. He is given return precautions.

## 2015-01-30 NOTE — Telephone Encounter (Signed)
Patient's wife called. Disregard message. Pharmacy called and medication has been received by patient.

## 2015-01-30 NOTE — Progress Notes (Signed)
Pre visit review using our clinic review tool, if applicable. No additional management support is needed unless otherwise documented below in the visit note. 

## 2015-01-30 NOTE — Patient Instructions (Signed)
Nice to meet you. We will treat you for a sinus infection with augmentin. You should eat yogurt or take a probiotic while on this medication. If you develop chest pain, shortness of breath, coughing up blood, fevers, worsening symptoms, or any new or changing symptoms please seek medical attention.

## 2015-01-30 NOTE — Progress Notes (Signed)
Patient ID: AC GLADE, male   DOB: 02-15-1955, 60 y.o.   MRN: [id number]  Tommi Rumps, MD Phone: [phone]  Walter Salas is a 60 y.o. male who presents today for same-day visit.  Patient notes sinus congestion and postnasal drip for the last 3 weeks. Sinus congestion is mostly in his maxillary sinuses. Notes he is blowing blood tinged mucus out of his nose and feels mucous draining down the back of his throat. When he clears his throat the blood-tinged mucus comes out. He is not coughing up bright red blood. No shortness of breath or chest pain. No earaches or fever. He has been doing over-the-counter nasal spray and nasal wash. Typically in the past with over-the-counter remedies this resolves in about a week.  PMH: Current smoker.   ROS see history of present illness  Objective  Physical Exam Filed Vitals:   01/30/15 0941  BP: 110/62  Pulse: 69  Temp: 98.2 F (36.8 C)    Physical Exam  Constitutional: He is well-developed, well-nourished, and in no distress.  HENT:  Head: Normocephalic and atraumatic.  Right Ear: External ear normal.  Left Ear: External ear normal.  Mouth/Throat: Oropharynx is clear and moist. No oropharyngeal exudate.  Mild posterior oropharyngeal erythema, no tonsillar exudate or swelling, normal TMs bilaterally, no tenderness to percussion of sinuses  Eyes: Conjunctivae are normal. Pupils are equal, round, and reactive to light.  Neck: Neck supple.  Cardiovascular: Normal rate, regular rhythm and normal heart sounds.  Exam reveals no gallop and no friction rub.   No murmur heard. Pulmonary/Chest: Effort normal and breath sounds normal. No respiratory distress. He has no wheezes. He has no rales.  Lymphadenopathy:    He has no cervical adenopathy.  Neurological: He is alert. Gait normal.  Skin: Skin is warm and dry. He is not diaphoretic.     Assessment/Plan: Please see individual problem list.  Sinusitis, acute maxillary Suspect  bacterial sinusitis given duration and location of symptoms. Suspect the blood-tinged mucus is from nasal irritation. Normal lung exam. Normal vitals. We'll treat with Augmentin. He'll start Flonase. He can continue nasal rinses. He is given return precautions.     Meds ordered this encounter  Medications  . amoxicillin-clavulanate (AUGMENTIN) 875-125 MG tablet    Sig: Take 1 tablet by mouth 2 (two) times daily.    Dispense:  14 tablet    Refill:  0     Tommi Rumps

## 2015-05-15 ENCOUNTER — Ambulatory Visit (INDEPENDENT_AMBULATORY_CARE_PROVIDER_SITE_OTHER): Payer: Medicare Other | Admitting: Internal Medicine

## 2015-05-15 ENCOUNTER — Encounter: Payer: Self-pay | Admitting: Internal Medicine

## 2015-05-15 VITALS — BP 122/70 | HR 66 | Ht 70.0 in | Wt 236.0 lb

## 2015-05-15 DIAGNOSIS — R2232 Localized swelling, mass and lump, left upper limb: Secondary | ICD-10-CM | POA: Diagnosis not present

## 2015-05-15 DIAGNOSIS — M549 Dorsalgia, unspecified: Secondary | ICD-10-CM

## 2015-05-15 DIAGNOSIS — E119 Type 2 diabetes mellitus without complications: Secondary | ICD-10-CM | POA: Diagnosis not present

## 2015-05-15 DIAGNOSIS — G8929 Other chronic pain: Secondary | ICD-10-CM

## 2015-05-15 LAB — COMPREHENSIVE METABOLIC PANEL
ALT: 17 U/L (ref 0–53)
AST: 15 U/L (ref 0–37)
Albumin: 3.9 g/dL (ref 3.5–5.2)
Alkaline Phosphatase: 69 U/L (ref 39–117)
BILIRUBIN TOTAL: 0.4 mg/dL (ref 0.2–1.2)
BUN: 11 mg/dL (ref 6–23)
CALCIUM: 9.1 mg/dL (ref 8.4–10.5)
CHLORIDE: 98 meq/L (ref 96–112)
CO2: 32 meq/L (ref 19–32)
CREATININE: 0.8 mg/dL (ref 0.40–1.50)
GFR: 104.28 mL/min (ref >60.00)
GLUCOSE: 181 mg/dL — AB (ref 70–99)
Potassium: 3.3 mEq/L — ABNORMAL LOW (ref 3.5–5.1)
SODIUM: 138 meq/L (ref 135–145)
Total Protein: 6.4 g/dL (ref 6.0–8.3)

## 2015-05-15 LAB — LDL CHOLESTEROL, DIRECT: LDL DIRECT: 60 mg/dL

## 2015-05-15 LAB — LIPID PANEL
CHOL/HDL RATIO: 5
CHOLESTEROL: 123 mg/dL (ref 0–200)
HDL: 23.4 mg/dL — ABNORMAL LOW (ref >39.00)
NONHDL: 99.96
Triglycerides: 291 mg/dL — ABNORMAL HIGH (ref 0.0–149.0)
VLDL: 58.2 mg/dL — AB (ref 0.0–40.0)

## 2015-05-15 LAB — HEMOGLOBIN A1C: Hgb A1c MFr Bld: 7.7 % — ABNORMAL HIGH (ref 4.6–6.5)

## 2015-05-15 MED ORDER — TRAMADOL HCL 50 MG PO TABS: 100.0000 mg | ORAL_TABLET | Freq: Three times a day (TID) | ORAL | Status: DC | PRN

## 2015-05-15 NOTE — Progress Notes (Signed)
Subjective:    Patient ID: Walter Salas, male    DOB: 1955-10-10, 60 y.o.   MRN: [id number]  HPI  60YO male presents for follow up.  Recently seen at [facility] for chronic back pain. Told he has severe arthritis. Lyrica was increased to 75mg . Started on Lidoderm and Cymbalta. Symptoms have been improved.  DM - Started on Glipizide. BG have been elevated near 180-190s. Working on improving diet and staying active.  Left 5th finger growth - Present for over 1 year. Recently more painful.  Lab Results  Component Value Date   HGBA1C 6.6* 10/24/2014     Wt Readings from Last 3 Encounters:  05/15/15 236 lb (107.049 kg)  01/30/15 230 lb 3.2 oz (104.418 kg)  11/15/14 219 lb (99.338 kg)   BP Readings from Last 3 Encounters:  05/15/15 122/70  01/30/15 110/62  11/15/14 100/56    Past Medical History  Diagnosis Date  . Arthritis   . Depression   . Diabetes mellitus without complication ([place])   . Ulcer   . Hyperlipidemia   . Kidney stone   . Colon polyp   . Hx of laminectomy 1999  . CAD (coronary artery disease)    Family History  Problem Relation Age of Onset  . Diabetes Mother   . Heart disease Father   . Diabetes Father   . Diabetes Sister   . Heart disease Sister   . Diabetes Brother   . Heart disease Paternal Grandfather    Past Surgical History  Procedure Laterality Date  . Coronary angioplasty with stent placement  2014    [facility]  . Shoulder arthroscopy Right 2012  . Cervical fusion  2000. 2003  . Back surgery  1993, 1994, 1997, 1998  . Carpal tunnel release Bilateral 1990  . Cholecystectomy  2000  . Tumor excision  2007   Social History   Social History  . Marital Status: Married    Spouse Name: N/A  . Number of Children: N/A  . Years of Education: N/A   Social History Main Topics  . Smoking status: Current Every Day Smoker -- 0.33 packs/day for 40 years    Types: Cigarettes  . Smokeless tobacco: Never Used  .  Alcohol Use: No  . Drug Use: No  . Sexual Activity: Not Asked   Other Topics Concern  . None   Social History Narrative   Lives in [place] with wife. Has cat.   Work - Database administrator, retired      Followed at [employer].      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      Diet - Regular   Exercise - starting back at gym    Review of Systems  Constitutional: Negative for fever, chills, activity change, appetite change, fatigue and unexpected weight change.  Eyes: Negative for visual disturbance.  Respiratory: Negative for cough and shortness of breath.   Cardiovascular: Negative for chest pain, palpitations and leg swelling.  Gastrointestinal: Negative for nausea, vomiting, abdominal pain, diarrhea, constipation and abdominal distention.  Genitourinary: Negative for dysuria, urgency and difficulty urinating.  Musculoskeletal: Positive for myalgias, back pain and arthralgias. Negative for gait problem.  Skin: Negative for color change and rash.  Neurological: Negative for weakness.  Hematological: Negative for adenopathy.  Psychiatric/Behavioral: Negative for suicidal ideas, sleep disturbance and dysphoric mood. The patient is not nervous/anxious.        Objective:    BP  122/70 mmHg  Pulse 66  Ht 5\' 10"  (1.778 m)  Wt 236 lb (107.049 kg)  BMI 33.86 kg/m2  SpO2 96% Physical Exam  Constitutional: He is oriented to person, place, and time. He appears well-developed and well-nourished. No distress.  HENT:  Head: Normocephalic and atraumatic.  Right Ear: External ear normal.  Left Ear: External ear normal.  Nose: Nose normal.  Mouth/Throat: Oropharynx is clear and moist. No oropharyngeal exudate.  Eyes: Conjunctivae and EOM are normal. Pupils are equal, round, and reactive to light. Right eye exhibits no discharge. Left eye exhibits no discharge. No scleral icterus.  Neck: Normal range of motion. Neck supple. No tracheal deviation present. No thyromegaly  present.  Cardiovascular: Normal rate, regular rhythm and normal heart sounds.  Exam reveals no gallop and no friction rub.   No murmur heard. Pulmonary/Chest: Effort normal and breath sounds normal. No accessory muscle usage. No tachypnea. No respiratory distress. He has no decreased breath sounds. He has no wheezes. He has no rhonchi. He has no rales. He exhibits no tenderness.  Musculoskeletal: Normal range of motion. He exhibits no edema.       Hands: Lymphadenopathy:    He has no cervical adenopathy.  Neurological: He is alert and oriented to person, place, and time. No cranial nerve deficit. Coordination normal.  Skin: Skin is warm and dry. No rash noted. He is not diaphoretic. No erythema. No pallor.  Psychiatric: He has a normal mood and affect. His behavior is normal. Judgment and thought content normal.          Assessment & Plan:   Problem List Items Addressed This Visit      Unprioritized   Chronic back pain    Back pain secondary to OA. Improved with new addition of Cymbalta, Lidocaine, and increased dose of Lyrica. Will continue current medications.      Relevant Medications   methocarbamol (ROBAXIN) 500 MG tablet   traMADol (ULTRAM) 50 MG tablet   Diabetes mellitus, type 2 ([place]) - Primary    BG slightly elevated. Started on Glipizide at the [place]. Will recheck A1c with labs today.      Relevant Medications   glipiZIDE (GLUCOTROL) 5 MG tablet   Other Relevant Orders   Comprehensive metabolic panel   Hemoglobin A1c   Lipid panel   Microalbumin / creatinine urine ratio   Nodule of finger of left hand    Left fifth finger nodule most consistent with neuroma. Will set up evaluation with hand surgery as this nodule has become more painful for him. Follow up prn.      Relevant Orders   Ambulatory referral to Hand Surgery       Return in about 3 months (around 08/15/2015) for Recheck of Diabetes.  Ronette Deter, MD Internal [facility]

## 2015-05-15 NOTE — Assessment & Plan Note (Signed)
BG slightly elevated. Started on Glipizide at the [place]. Will recheck A1c with labs today.

## 2015-05-15 NOTE — Patient Instructions (Addendum)
Labs today.  Try to limit carbohydrate intake to 35gm with a meal.  We will set up an evaluation with Dr. Malvin Johns in Hand Surgery.

## 2015-05-15 NOTE — Assessment & Plan Note (Signed)
Back pain secondary to OA. Improved with new addition of Cymbalta, Lidocaine, and increased dose of Lyrica. Will continue current medications.

## 2015-05-15 NOTE — Assessment & Plan Note (Signed)
Left fifth finger nodule most consistent with neuroma. Will set up evaluation with hand surgery as this nodule has become more painful for him. Follow up prn.

## 2015-05-28 DIAGNOSIS — R2232 Localized swelling, mass and lump, left upper limb: Secondary | ICD-10-CM | POA: Diagnosis not present

## 2015-06-06 DIAGNOSIS — M1990 Unspecified osteoarthritis, unspecified site: Secondary | ICD-10-CM | POA: Diagnosis not present

## 2015-06-06 DIAGNOSIS — Z7982 Long term (current) use of aspirin: Secondary | ICD-10-CM | POA: Diagnosis not present

## 2015-06-06 DIAGNOSIS — L928 Other granulomatous disorders of the skin and subcutaneous tissue: Secondary | ICD-10-CM | POA: Diagnosis not present

## 2015-06-06 DIAGNOSIS — I2510 Atherosclerotic heart disease of native coronary artery without angina pectoris: Secondary | ICD-10-CM | POA: Diagnosis not present

## 2015-06-06 DIAGNOSIS — I10 Essential (primary) hypertension: Secondary | ICD-10-CM | POA: Diagnosis not present

## 2015-06-06 DIAGNOSIS — E119 Type 2 diabetes mellitus without complications: Secondary | ICD-10-CM | POA: Diagnosis not present

## 2015-06-06 DIAGNOSIS — Z794 Long term (current) use of insulin: Secondary | ICD-10-CM | POA: Diagnosis not present

## 2015-06-06 DIAGNOSIS — F329 Major depressive disorder, single episode, unspecified: Secondary | ICD-10-CM | POA: Diagnosis not present

## 2015-06-06 DIAGNOSIS — L923 Foreign body granuloma of the skin and subcutaneous tissue: Secondary | ICD-10-CM | POA: Diagnosis not present

## 2015-06-06 DIAGNOSIS — Z79899 Other long term (current) drug therapy: Secondary | ICD-10-CM | POA: Diagnosis not present

## 2015-06-06 DIAGNOSIS — R2232 Localized swelling, mass and lump, left upper limb: Secondary | ICD-10-CM | POA: Diagnosis not present

## 2015-06-06 DIAGNOSIS — Z981 Arthrodesis status: Secondary | ICD-10-CM | POA: Diagnosis not present

## 2015-06-06 DIAGNOSIS — M419 Scoliosis, unspecified: Secondary | ICD-10-CM | POA: Diagnosis not present

## 2015-06-06 DIAGNOSIS — F17210 Nicotine dependence, cigarettes, uncomplicated: Secondary | ICD-10-CM | POA: Diagnosis not present

## 2015-06-24 DIAGNOSIS — Z1283 Encounter for screening for malignant neoplasm of skin: Secondary | ICD-10-CM | POA: Diagnosis not present

## 2015-06-24 DIAGNOSIS — Z872 Personal history of diseases of the skin and subcutaneous tissue: Secondary | ICD-10-CM | POA: Diagnosis not present

## 2015-06-24 DIAGNOSIS — D229 Melanocytic nevi, unspecified: Secondary | ICD-10-CM | POA: Diagnosis not present

## 2015-08-15 ENCOUNTER — Other Ambulatory Visit: Payer: Self-pay | Admitting: Internal Medicine

## 2015-08-15 ENCOUNTER — Ambulatory Visit (INDEPENDENT_AMBULATORY_CARE_PROVIDER_SITE_OTHER): Payer: Medicare Other | Admitting: Internal Medicine

## 2015-08-15 VITALS — BP 108/60 | HR 79 | Ht 70.0 in | Wt 231.0 lb

## 2015-08-15 DIAGNOSIS — E119 Type 2 diabetes mellitus without complications: Secondary | ICD-10-CM

## 2015-08-15 DIAGNOSIS — I2510 Atherosclerotic heart disease of native coronary artery without angina pectoris: Secondary | ICD-10-CM

## 2015-08-15 DIAGNOSIS — M549 Dorsalgia, unspecified: Secondary | ICD-10-CM

## 2015-08-15 DIAGNOSIS — I10 Essential (primary) hypertension: Secondary | ICD-10-CM | POA: Diagnosis not present

## 2015-08-15 DIAGNOSIS — G8929 Other chronic pain: Secondary | ICD-10-CM

## 2015-08-15 DIAGNOSIS — I2583 Coronary atherosclerosis due to lipid rich plaque: Secondary | ICD-10-CM

## 2015-08-15 LAB — MICROALBUMIN / CREATININE URINE RATIO
CREATININE, U: 178.6 mg/dL
MICROALB UR: 8.6 mg/dL — AB (ref 0.0–1.9)
Microalb Creat Ratio: 4.8 mg/g (ref 0.0–30.0)

## 2015-08-15 LAB — COMPREHENSIVE METABOLIC PANEL
ALT: 16 U/L (ref 0–53)
AST: 14 U/L (ref 0–37)
Albumin: 4.4 g/dL (ref 3.5–5.2)
Alkaline Phosphatase: 66 U/L (ref 39–117)
BILIRUBIN TOTAL: 0.6 mg/dL (ref 0.2–1.2)
BUN: 15 mg/dL (ref 6–23)
CHLORIDE: 97 meq/L (ref 96–112)
CO2: 32 meq/L (ref 19–32)
CREATININE: 0.85 mg/dL (ref 0.40–1.50)
Calcium: 9.5 mg/dL (ref 8.4–10.5)
GFR: 97.15 mL/min (ref >60.00)
GLUCOSE: 163 mg/dL — AB (ref 70–99)
Potassium: 3.7 mEq/L (ref 3.5–5.1)
SODIUM: 136 meq/L (ref 135–145)
Total Protein: 7.2 g/dL (ref 6.0–8.3)

## 2015-08-15 LAB — LIPID PANEL
CHOL/HDL RATIO: 5
CHOLESTEROL: 131 mg/dL (ref 0–200)
HDL: 27.6 mg/dL — ABNORMAL LOW (ref >39.00)
NONHDL: 103
Triglycerides: 361 mg/dL — ABNORMAL HIGH (ref 0.0–149.0)
VLDL: 72.2 mg/dL — AB (ref 0.0–40.0)

## 2015-08-15 LAB — HEMOGLOBIN A1C: Hgb A1c MFr Bld: 6.7 % — ABNORMAL HIGH (ref 4.6–6.5)

## 2015-08-15 LAB — LDL CHOLESTEROL, DIRECT: Direct LDL: 50 mg/dL

## 2015-08-15 NOTE — Assessment & Plan Note (Signed)
BP Readings from Last 3 Encounters:  08/15/15 108/60  05/15/15 122/70  01/30/15 110/62   BP well controlled. Renal function with labs. Continue current medication.

## 2015-08-15 NOTE — Assessment & Plan Note (Signed)
BG more elevated recently by report. Will check A1c with labs. Discussed possibly increasing Glipizide if A1c elevated. Encouraged limiting sugar in diet.

## 2015-08-15 NOTE — Patient Instructions (Signed)
Labs today.   Follow up in 3 months.  

## 2015-08-15 NOTE — Telephone Encounter (Signed)
Refill request for Tramadol, last seen [id number], last filled [id number].  Please advise.

## 2015-08-15 NOTE — Progress Notes (Signed)
Subjective:    Patient ID: Walter Salas, male    DOB: [birth year], 60 y.o.   MRN: [id number]  HPI 60YO male presents for follow up.  DM - BG elevated up to 200 at times. Compliant with medication. Notes some dietary indiscretion recently.   Lab Results  Component Value Date   HGBA1C 7.7 (H) 05/15/2015    Having more back pain since yesterday. Started after bending in shower. Typically happens 2-3 times per year. Taking Methcarbamol and using Lidocaine cream.  CAD - No recent chest pain, dyspnea.   Wt Readings from Last 3 Encounters:  08/15/15 231 lb (104.8 kg)  05/15/15 236 lb (107 kg)  01/30/15 230 lb 3.2 oz (104.4 kg)   BP Readings from Last 3 Encounters:  08/15/15 108/60  05/15/15 122/70  01/30/15 110/62    Past Medical History:  Diagnosis Date  . Arthritis   . CAD (coronary artery disease)   . Colon polyp   . Depression   . Diabetes mellitus without complication ([place])   . Hx of laminectomy 1999  . Hyperlipidemia   . Kidney stone   . Ulcer    Family History  Problem Relation Age of Onset  . Diabetes Mother   . Heart disease Father   . Diabetes Father   . Diabetes Sister   . Heart disease Sister   . Diabetes Brother   . Heart disease Paternal Grandfather    Past Surgical History:  Procedure Laterality Date  . [place]  . CARPAL TUNNEL RELEASE Bilateral 1990  . CERVICAL FUSION  2000. 2003  . CHOLECYSTECTOMY  2000  . CORONARY ANGIOPLASTY WITH STENT PLACEMENT  2014   [facility] [place]  . SHOULDER ARTHROSCOPY Right 2012  . TUMOR EXCISION  2007   Social History   Social History  . Marital status: Married    Spouse name: N/A  . Number of children: N/A  . Years of education: N/A   Social History Main Topics  . Smoking status: Current Every Day Smoker    Packs/day: 0.33    Years: 40.00    Types: Cigarettes  . Smokeless tobacco: Never Used  . Alcohol use No  . Drug use: No  . Sexual  activity: Not on file   Other Topics Concern  . Not on file   Social History Narrative   Lives in [place] with wife. Has cat.   Work - Database administrator, retired      Followed at [employer].      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      Diet - Regular   Exercise - starting back at gym    Review of Systems  Constitutional: Negative for activity change, appetite change, chills, fatigue, fever and unexpected weight change.  Eyes: Negative for visual disturbance.  Respiratory: Negative for cough and shortness of breath.   Cardiovascular: Negative for chest pain, palpitations and leg swelling.  Gastrointestinal: Negative for abdominal distention and abdominal pain.  Endocrine: Negative for polydipsia, polyphagia and polyuria.  Genitourinary: Negative for difficulty urinating, dysuria and urgency.  Musculoskeletal: Positive for arthralgias, back pain and myalgias. Negative for gait problem.  Skin: Negative for color change and rash.  Hematological: Negative for adenopathy.  Psychiatric/Behavioral: Negative for dysphoric mood and sleep disturbance. The patient is not nervous/anxious.        Objective:    BP 108/60 (BP Location: Left Arm, Patient Position:  Sitting, Cuff Size: Large)   Pulse 79   Ht 5\' 10"  (1.778 m)   Wt 231 lb (104.8 kg)   SpO2 94%   BMI 33.15 kg/m  Physical Exam  Constitutional: He is oriented to person, place, and time. He appears well-developed and well-nourished. No distress.  HENT:  Head: Normocephalic and atraumatic.  Right Ear: External ear normal.  Left Ear: External ear normal.  Nose: Nose normal.  Mouth/Throat: Oropharynx is clear and moist. No oropharyngeal exudate.  Eyes: Conjunctivae and EOM are normal. Pupils are equal, round, and reactive to light. Right eye exhibits no discharge. Left eye exhibits no discharge. No scleral icterus.  Neck: Normal range of motion. Neck supple. No tracheal deviation present. No thyromegaly  present.  Cardiovascular: Normal rate, regular rhythm and normal heart sounds.  Exam reveals no gallop and no friction rub.   No murmur heard. Pulmonary/Chest: Effort normal and breath sounds normal. No accessory muscle usage. No tachypnea. No respiratory distress. He has no decreased breath sounds. He has no wheezes. He has no rhonchi. He has no rales. He exhibits no tenderness.  Musculoskeletal: He exhibits no edema.       Lumbar back: He exhibits decreased range of motion, tenderness, pain and spasm. He exhibits no deformity.       Back:  Lymphadenopathy:    He has no cervical adenopathy.  Neurological: He is alert and oriented to person, place, and time. No cranial nerve deficit. Coordination normal.  Skin: Skin is warm and dry. No rash noted. He is not diaphoretic. No erythema. No pallor.  Psychiatric: He has a normal mood and affect. His behavior is normal. Judgment and thought content normal.          Assessment & Plan:   Problem List Items Addressed This Visit      Unprioritized   Chronic back pain (Chronic)    Chronic back pain with recent acute exacerbation. Will continue Robaxin, TENS unit, prn Tramadol. Follow up if symptoms are not improving.      Coronary artery disease (Chronic)    Symptomatically, doing well. Continue current medications.      Diabetes mellitus, type 2 ([place]) - Primary (Chronic)    BG more elevated recently by report. Will check A1c with labs. Discussed possibly increasing Glipizide if A1c elevated. Encouraged limiting sugar in diet.      Relevant Orders   Comprehensive metabolic panel   Hemoglobin A1c   Lipid panel   Microalbumin / creatinine urine ratio   Essential hypertension, benign (Chronic)    BP Readings from Last 3 Encounters:  08/15/15 108/60  05/15/15 122/70  01/30/15 110/62   BP well controlled. Renal function with labs. Continue current medication.       Other Visit Diagnoses   None.      Return in about 3 months  (around 11/15/2015) for New Patient.  Ronette Deter, MD Internal Medicine [facility]

## 2015-08-15 NOTE — Progress Notes (Signed)
Pre visit review using our clinic review tool, if applicable. No additional management support is needed unless otherwise documented below in the visit note. 

## 2015-08-15 NOTE — Assessment & Plan Note (Signed)
Chronic back pain with recent acute exacerbation. Will continue Robaxin, TENS unit, prn Tramadol. Follow up if symptoms are not improving.

## 2015-08-15 NOTE — Assessment & Plan Note (Signed)
Symptomatically, doing well. Continue current medications. 

## 2015-08-16 NOTE — Telephone Encounter (Signed)
Due to Dr. Gilford Rile leaving patient has appt on [id number].  Please advise refill.

## 2015-08-16 NOTE — Telephone Encounter (Signed)
It appears this is completed by Dr. Gilford Rile yesterday. Do you need me to do a prescription for the patient and if so the patient should be scheduled for sooner office visit.

## 2015-11-15 ENCOUNTER — Ambulatory Visit (INDEPENDENT_AMBULATORY_CARE_PROVIDER_SITE_OTHER): Payer: Medicare Other | Admitting: Family Medicine

## 2015-11-15 ENCOUNTER — Encounter: Payer: Self-pay | Admitting: Family Medicine

## 2015-11-15 VITALS — BP 112/68 | HR 75 | Temp 98.5°F | Wt 229.8 lb

## 2015-11-15 DIAGNOSIS — I10 Essential (primary) hypertension: Secondary | ICD-10-CM | POA: Diagnosis not present

## 2015-11-15 DIAGNOSIS — Z23 Encounter for immunization: Secondary | ICD-10-CM

## 2015-11-15 DIAGNOSIS — E785 Hyperlipidemia, unspecified: Secondary | ICD-10-CM | POA: Diagnosis not present

## 2015-11-15 DIAGNOSIS — R1011 Right upper quadrant pain: Secondary | ICD-10-CM

## 2015-11-15 DIAGNOSIS — E119 Type 2 diabetes mellitus without complications: Secondary | ICD-10-CM | POA: Diagnosis not present

## 2015-11-15 LAB — COMPREHENSIVE METABOLIC PANEL
ALT: 16 U/L (ref 0–53)
AST: 12 U/L (ref 0–37)
Albumin: 4.4 g/dL (ref 3.5–5.2)
Alkaline Phosphatase: 71 U/L (ref 39–117)
BUN: 16 mg/dL (ref 6–23)
CO2: 31 meq/L (ref 19–32)
Calcium: 9.8 mg/dL (ref 8.4–10.5)
Chloride: 99 mEq/L (ref 96–112)
Creatinine, Ser: 1.02 mg/dL (ref 0.40–1.50)
GFR: 78.65 mL/min (ref >60.00)
GLUCOSE: 174 mg/dL — AB (ref 70–99)
POTASSIUM: 3.9 meq/L (ref 3.5–5.1)
Sodium: 138 mEq/L (ref 135–145)
Total Bilirubin: 0.6 mg/dL (ref 0.2–1.2)
Total Protein: 6.8 g/dL (ref 6.0–8.3)

## 2015-11-15 LAB — HEMOGLOBIN A1C: HEMOGLOBIN A1C: 6.8 % — AB (ref 4.6–6.5)

## 2015-11-15 NOTE — Assessment & Plan Note (Signed)
Rare issues with this. Seems to be tied to constipation. Benign exam today. We'll check liver function tests. If persists or becomes more frequent will evaluate further with ultrasound.

## 2015-11-15 NOTE — Assessment & Plan Note (Signed)
At goal. Check A1c.

## 2015-11-15 NOTE — Assessment & Plan Note (Signed)
At goal. Continue current medications. 

## 2015-11-15 NOTE — Progress Notes (Signed)
  Tommi Rumps, MD Phone: [phone]  Walter Salas is a 60 y.o. male who presents today for f/u.  HYPERTENSION Disease Monitoring: Blood pressure range-not checking Chest pain- no      Dyspnea- no Medications: Compliance- taking metoprolol, lisinopril, chlorthalidone   Edema- no  DIABETES Disease Monitoring: Blood Sugar ranges-150 Polyuria/phagia/dipsia- no      optho- saw 3-4 months ago, has appointment at [facility] for diabetic retinopathy scan Medications: Compliance- taking metformin, glipizide Hypoglycemic symptoms- no  HYPERLIPIDEMIA Disease Monitoring: See symptoms for Hypertension Medications: Compliance- taking lipitor, fenofibrate  Right upper quadrant pain- yes, see below  Muscle aches- no  Right upper quadrant discomfort: Patient notes rarely he'll have some discomfort for a couple of days in his right upper quadrant. He notes not having bowel movements over that period of time. Notes he'll eventually have a bowel movement and the pain will go away. Notes no blood in his stool, nausea, or vomiting. No pain at this time. Last episode was one month ago.  PMH: Smoker   ROS see history of present illness  Objective  Physical Exam Vitals:   11/15/15 1038  BP: 112/68  Pulse: 75  Temp: 98.5 F (36.9 C)    BP Readings from Last 3 Encounters:  11/15/15 112/68  08/15/15 108/60  05/15/15 122/70   Wt Readings from Last 3 Encounters:  11/15/15 229 lb 12.8 oz (104.2 kg)  08/15/15 231 lb (104.8 kg)  05/15/15 236 lb (107 kg)    Physical Exam  Constitutional: No distress.  Cardiovascular: Normal rate, regular rhythm and normal heart sounds.   Pulmonary/Chest: Effort normal and breath sounds normal.  Abdominal: Soft. Bowel sounds are normal. He exhibits no distension. There is no tenderness. There is no rebound and no guarding.  Neurological: He is alert. Gait normal.  Skin: Skin is warm and dry. He is not diaphoretic.     Assessment/Plan: Please see individual  problem list.  Essential hypertension, benign At goal. Continue current medications.  Diabetes mellitus, type 2 (HCC) At goal. Check A1c.  Hyperlipidemia Some right upper quadrant discomfort though doubt this is related to his statin. We will check a CMP. Continue statin.  Abdominal pain, right upper quadrant Rare issues with this. Seems to be tied to constipation. Benign exam today. We'll check liver function tests. If persists or becomes more frequent will evaluate further with ultrasound.   Orders Placed This Encounter  Procedures  . Flu Vaccine QUAD 36+ mos IM  . HgB A1c  . Comp Met (CMET)    Tommi Rumps, MD [place] [facility]

## 2015-11-15 NOTE — Assessment & Plan Note (Signed)
Some right upper quadrant discomfort though doubt this is related to his statin. We will check a CMP. Continue statin.

## 2015-11-15 NOTE — Patient Instructions (Addendum)
Nice to see you. We're going to check your A1c today. Please continue your current medications. If you develop persistent abdominal discomfort or change in symptoms please seek evaluation.Marland Kitchen

## 2016-02-05 DIAGNOSIS — E119 Type 2 diabetes mellitus without complications: Secondary | ICD-10-CM | POA: Diagnosis not present

## 2016-02-11 ENCOUNTER — Encounter: Payer: Self-pay | Admitting: Family Medicine

## 2016-02-11 ENCOUNTER — Ambulatory Visit (INDEPENDENT_AMBULATORY_CARE_PROVIDER_SITE_OTHER): Payer: Medicare Other | Admitting: Family Medicine

## 2016-02-11 VITALS — BP 120/68 | HR 73 | Temp 98.9°F | Wt 237.0 lb

## 2016-02-11 DIAGNOSIS — E1159 Type 2 diabetes mellitus with other circulatory complications: Secondary | ICD-10-CM

## 2016-02-11 DIAGNOSIS — B351 Tinea unguium: Secondary | ICD-10-CM

## 2016-02-11 DIAGNOSIS — M654 Radial styloid tenosynovitis [de Quervain]: Secondary | ICD-10-CM | POA: Diagnosis not present

## 2016-02-11 DIAGNOSIS — I10 Essential (primary) hypertension: Secondary | ICD-10-CM

## 2016-02-11 NOTE — Progress Notes (Signed)
Pre visit review using our clinic review tool, if applicable. No additional management support is needed unless otherwise documented below in the visit note. 

## 2016-02-11 NOTE — Assessment & Plan Note (Signed)
Noted on bilateral great toes. Patient reports he has taken oral medications and used topical medications for this and has never been able to get rid of it. We'll continue to monitor.

## 2016-02-11 NOTE — Patient Instructions (Signed)
Nice to see you. We will refer you to orthopedics to consider injection in your left wrist. You should ice this area for 10-15 minutes at a time about 3 times a day. If the discomfort is gone by the time you see orthopedics you may cancel the referral. We'll have you return in about a week for an A1c.

## 2016-02-11 NOTE — Assessment & Plan Note (Signed)
At goal. Continue current medications. 

## 2016-02-11 NOTE — Progress Notes (Signed)
  Tommi Rumps, MD Phone: [phone]  Walter Salas is a 61 y.o. male who presents today for follow-up.  HYPERTENSION  Disease Monitoring  Home BP Monitoring not checking Chest pain- no    Dyspnea- no Medications  Compliance-  Taking metoprolol, chlorthalidone, lisinopril.  Edema- no  DIABETES Disease Monitoring: Blood Sugar ranges-has been slightly higher, 160's Polyuria/phagia/dipsia- no      optho- saw last week Medications: Compliance- taking metformin, glipizide Hypoglycemic symptoms- no  Patient notes left wrist pain over the last 2 weeks. No injury. It is over the radial styloid process. Hurts if he moves it the wrong way. He has been wearing a brace and using lidocaine cream with little benefit. No numbness or weakness.   PMH: Smoker   ROS see history of present illness  Objective  Physical Exam Vitals:   02/11/16 1038  BP: 120/68  Pulse: 73  Temp: 98.9 F (37.2 C)    BP Readings from Last 3 Encounters:  02/11/16 120/68  11/15/15 112/68  08/15/15 108/60   Wt Readings from Last 3 Encounters:  02/11/16 237 lb (107.5 kg)  11/15/15 229 lb 12.8 oz (104.2 kg)  08/15/15 231 lb (104.8 kg)    Physical Exam  Constitutional: No distress.  Cardiovascular: Normal rate, regular rhythm and normal heart sounds.   Pulmonary/Chest: Effort normal and breath sounds normal.  Musculoskeletal:  Tenderness over the left radial styloid, no swelling, warmth, or erythema, no anatomic snuffbox tenderness on the left side, full range of motion left wrist, positive Finkelstein's on the left, right wrist with no tenderness, warmth, erythema, or swelling, full range of motion right wrist, negative Finkelstein's right wrist, 5 out of 5 grip strength bilaterally, sensation to light touch intact in bilateral hands  Neurological: He is alert. Gait normal.  Skin: He is not diaphoretic.     Assessment/Plan: Please see individual problem list.  Essential hypertension, benign At  goal. Continue current medications.  Diabetes mellitus, type 2 (HCC) Slight worsening of control of home CBGs. He'll return some time after February 3 for an A1c.  De Quervain's disease (radial styloid tenosynovitis) History and exam most consistent with de Quervain's tenosynovitis. Patient with positive Finkelstein's on the left. Discussed icing this for 10-15 minutes 3 times daily. Given cardiac history and subdural hematoma history NSAIDs are not indicated. We will refer to orthopedics to consider injection. If symptoms have resolved by the time orthopedic appointment has been scheduled patient can cancel it.  Onychomycosis Noted on bilateral great toes. Patient reports he has taken oral medications and used topical medications for this and has never been able to get rid of it. We'll continue to monitor.   Orders Placed This Encounter  Procedures  . HgB A1c    Standing Status:   Future    Standing Expiration Date:   02/10/2017  . Ambulatory referral to Orthopedic Surgery    Referral Priority:   Routine    Referral Type:   Surgical    Referral Reason:   Specialty Services Required    Requested Specialty:   Orthopedic Surgery    Number of Visits Requested:   1    Tommi Rumps, MD [place]

## 2016-02-11 NOTE — Assessment & Plan Note (Signed)
Slight worsening of control of home CBGs. He'll return some time after February 3 for an A1c.

## 2016-02-11 NOTE — Assessment & Plan Note (Signed)
History and exam most consistent with de Quervain's tenosynovitis. Patient with positive Finkelstein's on the left. Discussed icing this for 10-15 minutes 3 times daily. Given cardiac history and subdural hematoma history NSAIDs are not indicated. We will refer to orthopedics to consider injection. If symptoms have resolved by the time orthopedic appointment has been scheduled patient can cancel it.

## 2016-02-17 ENCOUNTER — Other Ambulatory Visit (INDEPENDENT_AMBULATORY_CARE_PROVIDER_SITE_OTHER): Payer: Medicare Other

## 2016-02-17 DIAGNOSIS — E1159 Type 2 diabetes mellitus with other circulatory complications: Secondary | ICD-10-CM | POA: Diagnosis not present

## 2016-02-17 LAB — HEMOGLOBIN A1C: Hgb A1c MFr Bld: 7.3 % — ABNORMAL HIGH (ref 4.6–6.5)

## 2016-02-21 ENCOUNTER — Ambulatory Visit: Payer: Medicare Other | Admitting: Family Medicine

## 2016-02-26 DIAGNOSIS — M654 Radial styloid tenosynovitis [de Quervain]: Secondary | ICD-10-CM | POA: Diagnosis not present

## 2016-04-20 ENCOUNTER — Ambulatory Visit (INDEPENDENT_AMBULATORY_CARE_PROVIDER_SITE_OTHER): Payer: Medicare Other

## 2016-04-20 ENCOUNTER — Ambulatory Visit (INDEPENDENT_AMBULATORY_CARE_PROVIDER_SITE_OTHER): Payer: Medicare Other | Admitting: Family Medicine

## 2016-04-20 ENCOUNTER — Encounter: Payer: Self-pay | Admitting: Family Medicine

## 2016-04-20 VITALS — BP 118/62 | HR 69 | Temp 98.2°F | Resp 14 | Ht 70.0 in | Wt 229.8 lb

## 2016-04-20 VITALS — BP 118/62 | HR 69 | Temp 98.2°F | Resp 14 | Wt 229.8 lb

## 2016-04-20 DIAGNOSIS — R55 Syncope and collapse: Secondary | ICD-10-CM

## 2016-04-20 DIAGNOSIS — Z0000 Encounter for general adult medical examination without abnormal findings: Secondary | ICD-10-CM | POA: Diagnosis not present

## 2016-04-20 DIAGNOSIS — R32 Unspecified urinary incontinence: Secondary | ICD-10-CM | POA: Diagnosis not present

## 2016-04-20 LAB — POCT URINALYSIS DIPSTICK
BILIRUBIN UA: NEGATIVE
Blood, UA: NEGATIVE
Glucose, UA: NEGATIVE
Ketones, UA: NEGATIVE
LEUKOCYTES UA: NEGATIVE
NITRITE UA: NEGATIVE
Protein, UA: NEGATIVE
Spec Grav, UA: 1.015 (ref 1.030–1.035)
Urobilinogen, UA: 0.2 (ref ?–2.0)
pH, UA: 5.5 (ref 5.0–8.0)

## 2016-04-20 NOTE — Progress Notes (Signed)
Tommi Rumps, MD Phone: [phone]  Walter Salas is a 61 y.o. male who presents today for same day visit.  Patient notes on several occasions over the last 2 weeks he has had what he describes as a event where he passed out. He notes the first episode was 2 weeks ago and the last episode was 5 days ago. Notes he would feel his vision gets slightly blurry and then feels like a headache was going to come on and then he would just go out. No headaches after the event. Occurred while just sitting there. He had no preceding chest pain, shortness breath, or palpitations. Notes no incontinence or seizure-like activity. No postictal phase. On one occasion he was in a car and it only lasted for several seconds and he describes it as starting to drive across the bridge and then realizing that he is on the other side of the bridge and did not remember how he got there. He notes on one of the occasions he checked his blood sugar and it was 40. Typically his blood sugar runs in the 120-150 range. He notes no lightheadedness. He takes metformin and glipizide for diabetes. He notes several of these episodes were witnessed by his wife.  He notes over the last several weeks he's had 3 occasions where he has urinated on himself at night while he was sleeping. He notes he typically does get up multiple times during the night to go urinate. He notes no urinary stream issues or straining to urinate. He notes no bowel incontinence. He reports he was placed on a medicine through the [place] of which he does not know the name to help protect his kidneys as they stated he had urine protein. He reports having had a CT scan of his kidneys.  PMH: Smoker   ROS see history of present illness  Objective  Physical Exam Vitals:   04/20/16 1553  BP: 118/62  Pulse: 69  Resp: 14  Temp: 98.2 F (36.8 C)   Laying blood pressure 102/56 pulse 66 Sitting blood pressure 112/58 pulse 68 Standing blood pressure 108/62 pulse  71  BP Readings from Last 3 Encounters:  04/20/16 118/62  04/20/16 118/62  02/11/16 120/68   Wt Readings from Last 3 Encounters:  04/20/16 229 lb 12.8 oz (104.2 kg)  04/20/16 229 lb 12.8 oz (104.2 kg)  02/11/16 237 lb (107.5 kg)    Physical Exam  Constitutional: No distress.  HENT:  Head: Normocephalic and atraumatic.  Mouth/Throat: Oropharynx is clear and moist. No oropharyngeal exudate.  Eyes: Conjunctivae are normal. Pupils are equal, round, and reactive to light.  Cardiovascular: Normal rate, regular rhythm and normal heart sounds.   No carotid bruits  Pulmonary/Chest: Effort normal and breath sounds normal.  Genitourinary:  Genitourinary Comments: Patient declined rectal exam  Musculoskeletal: He exhibits no edema.  Neurological: He is alert.  CN 2-12 intact, 5/5 strength in bilateral biceps, triceps, grip, quads, hamstrings, plantar and dorsiflexion, sensation to light touch intact in bilateral UE and LE, normal gait  Skin: Skin is warm and dry. He is not diaphoretic.   EKG: Normal sinus rhythm, rate 62, first-degree AV block with a PR interval of 242, no ischemic changes noted   Assessment/Plan: Please see individual problem list.  Syncope Patient with several episodes of possible loss of consciousness. Not preceded by cardiac symptoms. They were preceded by some mild blurry vision and minimal headache. Associated with low blood sugar the one time he did check it. Suspect  the patient is getting hypoglycemic and this is causing the symptoms. He notes no seizure activity. EKG with first-degree AV block though no apparent ischemic changes or arrhythmias. Neurologically intact. He will stop his glipizide. We will get him to see cardiology for evaluation. I encouraged him to try to not drive until we have this sorted out and he agreed with this. He will monitor his sugars. He is given return precautions.  Urinary incontinence Concern given urination at night would be for  untreated BPH. Patient declined doing a rectal exam today. He is more concerned that it could be related to his kidneys. We will check his kidney function today. We'll request the records from the [place] regarding his CT scan and try to determine what medication they placed him on. He will monitor this and if it continues to recur could consider urology evaluation.   Orders Placed This Encounter  Procedures  . Comp Met (CMET)  . CBC  . TSH  . Ambulatory referral to Cardiology    Referral Priority:   Routine    Referral Type:   Consultation    Referral Reason:   Specialty Services Required    Requested Specialty:   Cardiology    Number of Visits Requested:   1  . POCT Urinalysis Dipstick  . EKG 12-Lead    Tommi Rumps, MD [place]

## 2016-04-20 NOTE — Progress Notes (Signed)
Subjective:   Walter Salas is a 61 y.o. male who presents for Medicare Annual/Subsequent preventive examination.  Review of Systems:  No ROS.  Medicare Wellness Visit. Cardiac Risk Factors include: advanced age (>50men, >4 women);male gender;hypertension;diabetes mellitus;obesity (BMI >30kg/m2)     Objective:    Vitals: BP 118/62 (BP Location: Left Arm, Patient Position: Sitting, Cuff Size: Normal)   Pulse 69   Temp 98.2 F (36.8 C) (Oral)   Resp 14   Ht 5\' 10"  (1.778 m)   Wt 229 lb 12.8 oz (104.2 kg)   SpO2 97%   BMI 32.97 kg/m   Body mass index is 32.97 kg/m.  Tobacco History  Smoking Status  . Current Every Day Smoker  . Packs/day: 1.00  . Years: 40.00  . Types: Cigarettes  Smokeless Tobacco  . Never Used     Ready to quit: Not Answered Counseling given: Not Answered   Past Medical History:  Diagnosis Date  . Arthritis   . CAD (coronary artery disease)   . Colon polyp   . Depression   . Diabetes mellitus without complication ([place])   . Hx of laminectomy 1999  . Hyperlipidemia   . Kidney stone   . Ulcer [facility])    Past Surgical History:  Procedure Laterality Date  . [place]  . CARPAL TUNNEL RELEASE Bilateral 1990  . CERVICAL FUSION  2000. 2003  . CHOLECYSTECTOMY  2000  . CORONARY ANGIOPLASTY WITH STENT PLACEMENT  2014   [facility] [place]  . SHOULDER ARTHROSCOPY Right 2012  . TUMOR EXCISION  2007   Family History  Problem Relation Age of Onset  . Diabetes Mother   . Heart disease Father   . Diabetes Father   . Diabetes Sister   . Heart disease Sister   . Diabetes Brother   . Heart disease Paternal Grandfather    History  Sexual Activity  . Sexual activity: No    Outpatient Encounter Prescriptions as of 04/20/2016  Medication Sig  . atorvastatin (LIPITOR) 80 MG tablet Take 1 tablet (80 mg total) by mouth daily.  . chlorthalidone (HYGROTON) 50 MG tablet Take 50 mg by mouth daily.  .  cholecalciferol (VITAMIN D) 1000 UNITS tablet Take 1 tablet (1,000 Units total) by mouth QID.  . DULoxetine (CYMBALTA) 20 MG capsule Take 20 mg by mouth daily.  . fenofibrate (TRICOR) 145 MG tablet Take 1 tablet (145 mg total) by mouth daily.  Marland Kitchen glipiZIDE (GLUCOTROL) 5 MG tablet Take by mouth daily before breakfast.  . hydroxypropyl methylcellulose (ISOPTO TEARS) 2.5 % ophthalmic solution Place 1 drop into both eyes as needed for dry eyes.  Marland Kitchen lidocaine (LIDODERM) 5 % Place 1 patch onto the skin daily. Remove & Discard patch within 12 hours or as directed by MD  . lisinopril (PRINIVIL,ZESTRIL) 40 MG tablet Take 20 mg by mouth daily.  . metFORMIN (GLUCOPHAGE) 1000 MG tablet Take 1,000 mg by mouth 2 (two) times daily with a meal.  . methocarbamol (ROBAXIN) 500 MG tablet Take 500 mg by mouth as directed.  . metoprolol (LOPRESSOR) 50 MG tablet Take 50 mg by mouth 2 (two) times daily.  . nitroGLYCERIN (NITROSTAT) 0.4 MG SL tablet Place 0.4 mg under the tongue every 5 (five) minutes as needed for chest pain.  Marland Kitchen omeprazole (PRILOSEC) 20 MG capsule Take 20 mg by mouth daily.  . pregabalin (LYRICA) 50 MG capsule Take 50 mg by mouth 3 (three)  times daily.  . sennosides-docusate sodium (SENOKOT-S) 8.6-50 MG tablet Take 2 tablets by mouth 2 (two) times daily.  . traMADol (ULTRAM) 50 MG tablet TAKE 2 TABLETS BY MOUTH 3 TIMES DAILY AS NEEDED  . traZODone (DESYREL) 100 MG tablet Take 3 tablets (300 mg total) by mouth at bedtime as needed for sleep.  . vitamin B-12 (CYANOCOBALAMIN) 1000 MCG tablet Take 1 tablet (1,000 mcg total) by mouth daily.   No facility-administered encounter medications on file as of 04/20/2016.     Activities of Daily Living In your present state of health, do you have any difficulty performing the following activities: 04/20/2016  Hearing? N  Vision? N  Difficulty concentrating or making decisions? N  Walking or climbing stairs? Y  Dressing or bathing? N  Doing errands, shopping? N    Preparing Food and eating ? N  Using the Toilet? N  In the past six months, have you accidently leaked urine? Y  Do you have problems with loss of bowel control? N  Managing your Medications? N  Managing your Finances? N  Housekeeping or managing your Housekeeping? N  Some recent data might be hidden    Patient Care Team: Walter Haven, MD as PCP - General (Family Medicine) Walter Confer, MD (Internal Medicine) Walter Bellow, MD (General Surgery)   Assessment:    This is a routine wellness examination for Walter Salas. The goal of the wellness visit is to assist the patient how to close the gaps in care and create a preventative care plan for the patient.   Taking calcium VIT D as appropriate/Osteoporosis risk reviewed.  Medications reviewed; taking without issues or barriers.  Safety issues reviewed; smoke detectors in the home. Firearms locked up in the home. Wears seatbelts when driving or riding with others. Patient does wear sunscreen or protective clothing when in direct sunlight. No violence in the home.  Patient is alert, normal appearance, oriented to person/place/and time. Correctly identified the president of the [place], recall of 3/3 words, and performing simple calculations.  Patient displays appropriate judgement and can read correct time from watch face.  No new identified risk were noted.  No failures at ADL's or IADL's.   BMI- discussed the importance of a healthy diet, water intake and exercise. Educational material provided.   Daily fluid intake: 1 cups of caffeine, 5-6 cups of water  HTN- followed by PCP.  Dental care is managed by the [facility].  Eye- Visual acuity not assessed per patient preference since they have regular follow up with the ophthalmologist.  Wears corrective lenses.  Sleep patterns- Sleeps 5-6 hours at night.  Wakes feeling rested. CPAP not in use.  Patient Concerns: recent black outs/passing out and urinary  incontinence at night.  Deferred to PCP for follow up. Immediate appointment scheduled.  Exercise Activities and Dietary recommendations Current Exercise Habits: The patient does not participate in regular exercise at present  Goals    . Increase physical activity      Fall Risk Fall Risk  04/20/2016 10/24/2014  Falls in the past year? Yes No  Number falls in past yr: 2 or more -  Injury with Fall? No -  Risk Factor Category  High Fall Risk -  Risk for fall due to : History of fall(s) -  Follow up Education provided;Falls prevention discussed -   Depression Screen PHQ 2/9 Scores 04/20/2016 10/24/2014  PHQ - 2 Score 0 0    Cognitive Function MMSE - Mini Mental  State Exam 04/20/2016  Orientation to time 5  Orientation to Place 5  Registration 3  Attention/ Calculation 5  Recall 3  Language- name 2 objects 2  Language- repeat 1  Language- follow 3 step command 3  Language- read & follow direction 1  Write a sentence 1  Copy design 1  Total score 30        Immunization History  Administered Date(s) Administered  . Influenza,inj,Quad PF,36+ Mos 11/14/2013, 11/15/2015  . Influenza-Unspecified 10/28/2012, 10/19/2014  . Pneumococcal Polysaccharide-23 04/28/2013  . Tdap 04/28/2013   Screening Tests Health Maintenance  Topic Date Due  . Hepatitis C Screening  10-04-1955  . HIV Screening  11/07/1968  . INFLUENZA VACCINE  08/12/2016  . HEMOGLOBIN A1C  08/16/2016  . OPHTHALMOLOGY EXAM  01/27/2017  . FOOT EXAM  02/10/2017  . PNEUMOCOCCAL POLYSACCHARIDE VACCINE (2) 04/29/2018  . TETANUS/TDAP  04/29/2023  . COLONOSCOPY  12/30/2023      Plan:    End of life planning; Advance aging; Advanced directives discussed. Copy of current Living Will requested.    Medicare Attestation I have personally reviewed: The patient's medical and social history Their use of alcohol, tobacco or illicit drugs Their current medications and supplements The patient's functional ability  including ADLs,fall risks, home safety risks, cognitive, and hearing and visual impairment Diet and physical activities Evidence for depression   The patient's weight, height, BMI, and visual acuity have been recorded in the chart.  I have made referrals and provided education to the patient based on review of the above and I have provided the patient with a written personalized care plan for preventive services.    During the course of the visit the patient was educated and counseled about the following appropriate screening and preventive services:   Vaccines to include Pneumoccal, Influenza, Hepatitis B, Td, Zostavax, HCV  Colorectal cancer screening-UTD  Diabetes-followed by the [facility] and PCP  Glaucoma screening-annual eye exams  Nutrition counseling   Smoking cessation counseling  Patient Instructions (the written plan) was given to the patient.    Varney Biles, LPN  01/17/1094

## 2016-04-20 NOTE — Assessment & Plan Note (Signed)
Concern given urination at night would be for untreated BPH. Patient declined doing a rectal exam today. He is more concerned that it could be related to his kidneys. We will check his kidney function today. We'll request the records from the [place] regarding his CT scan and try to determine what medication they placed him on. He will monitor this and if it continues to recur could consider urology evaluation.

## 2016-04-20 NOTE — Patient Instructions (Addendum)
[name] , Thank you for taking time to come for your Medicare Wellness Visit. I appreciate your ongoing commitment to your health goals. Please review the following plan we discussed and let me know if I can assist you in the future.   Follow up with Dr. Caryl Bis as needed.    Bring a copy of your [place] and/or Living Will to be scanned into chart.  Have a great day!  These are the goals we discussed: Goals    . Increase physical activity       This is a list of the screening recommended for you and due dates:  Health Maintenance  Topic Date Due  .  Hepatitis C: One time screening is recommended by Center for Disease Control  (CDC) for  adults born from [birth year] through 1967.   22-Jan-1955  . HIV Screening  11/07/1968  . Flu Shot  08/12/2016  . Hemoglobin A1C  08/16/2016  . Eye exam for diabetics  01/27/2017  . Complete foot exam   02/10/2017  . Pneumococcal vaccine (2) 04/29/2018  . Tetanus Vaccine  04/29/2023  . Colon Cancer Screening  12/30/2023      Steps to Quit Smoking Smoking tobacco can be bad for your health. It can also affect almost every organ in your body. Smoking puts you and people around you at risk for many serious long-lasting (chronic) diseases. Quitting smoking is hard, but it is one of the best things that you can do for your health. It is never too late to quit. What are the benefits of quitting smoking? When you quit smoking, you lower your risk for getting serious diseases and conditions. They can include:  Lung cancer or lung disease.  Heart disease.  Stroke.  Heart attack.  Not being able to have children (infertility).  Weak bones (osteoporosis) and broken bones (fractures). If you have coughing, wheezing, and shortness of breath, those symptoms may get better when you quit. You may also get sick less often. If you are pregnant, quitting smoking can help to lower your chances of having a baby of low birth weight. What  can I do to help me quit smoking? Talk with your doctor about what can help you quit smoking. Some things you can do (strategies) include:  Quitting smoking totally, instead of slowly cutting back how much you smoke over a period of time.  Going to in-person counseling. You are more likely to quit if you go to many counseling sessions.  Using resources and support systems, such as:  Online chats with a Social worker.  Phone quitlines.  Printed Furniture conservator/restorer.  Support groups or group counseling.  Text messaging programs.  Mobile phone apps or applications.  Taking medicines. Some of these medicines may have nicotine in them. If you are pregnant or breastfeeding, do not take any medicines to quit smoking unless your doctor says it is okay. Talk with your doctor about counseling or other things that can help you. Talk with your doctor about using more than one strategy at the same time, such as taking medicines while you are also going to in-person counseling. This can help make quitting easier. What things can I do to make it easier to quit? Quitting smoking might feel very hard at first, but there is a lot that you can do to make it easier. Take these steps:  Talk to your family and friends. Ask them to support and encourage you.  Call phone quitlines, reach out  to support groups, or work with a Social worker.  Ask people who smoke to not smoke around you.  Avoid places that make you want (trigger) to smoke, such as:  Bars.  Parties.  Smoke-break areas at work.  Spend time with people who do not smoke.  Lower the stress in your life. Stress can make you want to smoke. Try these things to help your stress:  Getting regular exercise.  Deep-breathing exercises.  Yoga.  Meditating.  Doing a body scan. To do this, close your eyes, focus on one area of your body at a time from head to toe, and notice which parts of your body are tense. Try to relax the muscles in those  areas.  Download or buy apps on your mobile phone or tablet that can help you stick to your quit plan. There are many free apps, such as QuitGuide from the [employer] Office manager for Disease Control and Prevention). You can find more support from smokefree.gov and other websites. This information is not intended to replace advice given to you by your health care provider. Make sure you discuss any questions you have with your health care provider. Document Released: 10/25/2008 Document Revised: 08/27/2015 Document Reviewed: 05/15/2014 Elsevier Interactive Patient Education  2017 [employer].

## 2016-04-20 NOTE — Progress Notes (Signed)
Pre visit review using our clinic review tool, if applicable. No additional management support is needed unless otherwise documented below in the visit note. 

## 2016-04-20 NOTE — Assessment & Plan Note (Addendum)
Patient with several episodes of possible loss of consciousness. Not preceded by cardiac symptoms. They were preceded by some mild blurry vision and minimal headache. Associated with low blood sugar the one time he did check it. Suspect the patient is getting hypoglycemic and this is causing the symptoms. He notes no seizure activity. EKG with first-degree AV block though no apparent ischemic changes or arrhythmias. Neurologically intact. He will stop his glipizide. We will get him to see cardiology for evaluation. I encouraged him to try to not drive until we have this sorted out and he agreed with this. He will monitor his sugars. He is given return precautions.

## 2016-04-20 NOTE — Patient Instructions (Signed)
Nice to see you. We'll get you to see cardiology for evaluation of your syncopal episodes. We will have you stop your glipizide. If you have recurrent episodes of passing out you should be evaluated.

## 2016-04-21 LAB — TSH: TSH: 0.86 u[IU]/mL (ref 0.35–4.50)

## 2016-04-21 LAB — COMPREHENSIVE METABOLIC PANEL
ALBUMIN: 4.5 g/dL (ref 3.5–5.2)
ALK PHOS: 55 U/L (ref 39–117)
ALT: 14 U/L (ref 0–53)
AST: 13 U/L (ref 0–37)
BILIRUBIN TOTAL: 0.5 mg/dL (ref 0.2–1.2)
BUN: 18 mg/dL (ref 6–23)
CO2: 31 mEq/L (ref 19–32)
Calcium: 10.2 mg/dL (ref 8.4–10.5)
Chloride: 99 mEq/L (ref 96–112)
Creatinine, Ser: 0.9 mg/dL (ref 0.40–1.50)
GFR: 90.74 mL/min (ref >60.00)
GLUCOSE: 134 mg/dL — AB (ref 70–99)
Potassium: 4.2 mEq/L (ref 3.5–5.1)
Sodium: 136 mEq/L (ref 135–145)
TOTAL PROTEIN: 7.1 g/dL (ref 6.0–8.3)

## 2016-04-21 LAB — CBC
HCT: 41.6 % (ref 39.0–52.0)
Hemoglobin: 14.3 g/dL (ref 13.0–17.0)
MCHC: 34.4 g/dL (ref 30.0–36.0)
MCV: 82.5 fl (ref 78.0–100.0)
PLATELETS: 193 10*3/uL (ref 150.0–400.0)
RBC: 5.05 Mil/uL (ref 4.22–5.81)
RDW: 15 % (ref 11.5–15.5)
WBC: 7.7 10*3/uL (ref 4.0–10.5)

## 2016-04-24 NOTE — Progress Notes (Signed)
I have reviewed the above note and agree. Seen in office day of [facility].  Tommi Rumps, M.D.

## 2016-05-06 ENCOUNTER — Encounter: Payer: Self-pay | Admitting: Internal Medicine

## 2016-05-06 ENCOUNTER — Ambulatory Visit (INDEPENDENT_AMBULATORY_CARE_PROVIDER_SITE_OTHER): Payer: Medicare Other | Admitting: Internal Medicine

## 2016-05-06 VITALS — BP 106/60 | HR 67 | Ht 70.0 in | Wt 228.8 lb

## 2016-05-06 DIAGNOSIS — I10 Essential (primary) hypertension: Secondary | ICD-10-CM | POA: Diagnosis not present

## 2016-05-06 DIAGNOSIS — R55 Syncope and collapse: Secondary | ICD-10-CM | POA: Diagnosis not present

## 2016-05-06 DIAGNOSIS — I2510 Atherosclerotic heart disease of native coronary artery without angina pectoris: Secondary | ICD-10-CM | POA: Diagnosis not present

## 2016-05-06 DIAGNOSIS — E782 Mixed hyperlipidemia: Secondary | ICD-10-CM
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
## 2016-05-06 NOTE — Patient Instructions (Addendum)
Medication Instructions:  Your physician has recommended you make the following change in your medication:  1- TAPER OFF YOUR METOPROLOL. Take Metoprolol 25 mg by mouth two times a day for 3 days, THEN STOP TAKING.   Labwork: none  Testing/Procedures: Your physician has requested that you have an echocardiogram. Echocardiography is a painless test that uses sound waves to create images of your heart. It provides your doctor with information about the size and shape of your heart and how well your heart's chambers and valves are working. This procedure takes approximately one hour. There are no restrictions for this procedure.  Your physician has recommended that you wear an event monitor. Event monitors are medical devices that record the heart's electrical activity. Doctors most often [place] these monitors to diagnose arrhythmias. Arrhythmias are problems with the speed or rhythm of the heartbeat. The monitor is a small, portable device. You can wear one while you do your normal daily activities. This is usually used to diagnose what is causing palpitations/syncope (passing out).  Your physician has requested that you have a lexiscan myoview. For further information please visit [email or web address]. Please follow instruction sheet, as given.   Bend  Your caregiver has ordered a Stress Test with nuclear imaging. The purpose of this test is to evaluate the blood supply to your heart muscle. This procedure is referred to as a "Non-Invasive Stress Test." This is because other than having an IV started in your vein, nothing is inserted or "invades" your body. Cardiac stress tests are done to find areas of poor blood flow to the heart by determining the extent of coronary artery disease (CAD). Some patients exercise on a treadmill, which naturally increases the blood flow to your heart, while others who are  unable to walk on a treadmill due to physical limitations have a pharmacologic/chemical stress  agent called Lexiscan . This medicine will mimic walking on a treadmill by temporarily increasing your coronary blood flow.   Please note: these test may take anywhere between 2-4 hours to complete  PLEASE REPORT TO [facility] AT THE FIRST DESK WILL DIRECT YOU WHERE TO GO  Date of Procedure:______5/4/18     FRIDAY_____________  Arrival Time for Procedure:_______08:45 am_________  Instructions regarding medication:   _X_ : Hold diabetes medication morning of procedure METFORMIN.   _X__:  Hold other medications as follows:______CHLORTHALIDONE____   PLEASE NOTIFY THE OFFICE AT LEAST 24 HOURS IN ADVANCE IF YOU ARE UNABLE TO KEEP YOUR APPOINTMENT.  [phone] AND  PLEASE NOTIFY NUCLEAR MEDICINE AT [facility] AT LEAST 24 HOURS IN ADVANCE IF YOU ARE UNABLE TO KEEP YOUR APPOINTMENT. [phone]  How to prepare for your Myoview test:  1. Do not eat or drink after midnight 2. No caffeine for 24 hours prior to test 3. No smoking 24 hours prior to test. 4. Your medication may be taken with water.  If your doctor stopped a medication because of this test, do not take that medication. 5. Ladies, please do not wear dresses.  Skirts or pants are appropriate. Please wear a short sleeve shirt. 6. No perfume, cologne or lotion. 7. Wear comfortable walking shoes. No heels!     Follow-Up: Your physician recommends that you schedule a follow-up appointment in: [place].   If you need a refill on your cardiac medications before your next appointment, please call your pharmacy.  Cardiac Nuclear Scan A cardiac nuclear scan is a test that measures blood flow to the  heart when a person is resting and when he or she is exercising. The test looks for problems such as:  Not enough blood reaching a portion of the heart.  The heart muscle not working normally. You may need this test if:  You have heart disease.  You have had abnormal lab results.  You have  had heart surgery or angioplasty.  You have chest pain.  You have shortness of breath. In this test, a radioactive dye (tracer) is injected into your bloodstream. After the tracer has traveled to your heart, an imaging device is used to measure how much of the tracer is absorbed by or distributed to various areas of your heart. This procedure is usually done at a hospital and takes 2-4 hours. Tell a health care provider about:  Any allergies you have.  All medicines you are taking, including vitamins, herbs, eye drops, creams, and over-the-counter medicines.  Any problems you or family members have had with the use of anesthetic medicines.  Any blood disorders you have.  Any surgeries you have had.  Any medical conditions you have.  Whether you are pregnant or may be pregnant. What are the risks? Generally, this is a safe procedure. However, problems may occur, including:  Serious chest pain and heart attack. This is only a risk if the stress portion of the test is done.  Rapid heartbeat.  Sensation of warmth in your chest. This usually passes quickly. What happens before the procedure?  Ask your health care provider about changing or stopping your regular medicines. This is especially important if you are taking diabetes medicines or blood thinners.  Remove your jewelry on the day of the procedure. What happens during the procedure?  An IV tube will be inserted into one of your veins.  Your health care provider will inject a small amount of radioactive tracer through the tube.  You will wait for 20-40 minutes while the tracer travels through your bloodstream.  Your heart activity will be monitored with an electrocardiogram (ECG).  You will lie down on an exam table.  Images of your heart will be taken for about 15-20 minutes.  You may be asked to exercise on a treadmill or stationary bike. While you exercise, your heart's activity will be monitored with an ECG, and  your blood pressure will be checked. If you are unable to exercise, you may be given a medicine to increase blood flow to parts of your heart.  When blood flow to your heart has peaked, a tracer will again be injected through the IV tube.  After 20-40 minutes, you will get back on the exam table and have more images taken of your heart.  When the procedure is over, your IV tube will be removed. The procedure may vary among health care providers and hospitals. Depending on the type of tracer used, scans may need to be repeated 3-4 hours later. What happens after the procedure?  Unless your health care provider tells you otherwise, you may return to your normal schedule, including diet, activities, and medicines.  Unless your health care provider tells you otherwise, you may increase your fluid intake. This will help flush the contrast dye from your body. Drink enough fluid to keep your urine clear or pale yellow.  It is up to you to get your test results. Ask your health care provider, or the department that is doing the test, when your results will be ready. Summary  A cardiac nuclear scan measures the blood  flow to the heart when a person is resting and when he or she is exercising.  You may need this test if you are at risk for heart disease.  Tell your health care provider if you are pregnant.  Unless your health care provider tells you otherwise, increase your fluid intake. This will help flush the contrast dye from your body. Drink enough fluid to keep your urine clear or pale yellow. This information is not intended to replace advice given to you by your health care provider. Make sure you discuss any questions you have with your health care provider. Document Released: 01/24/2004 Document Revised: 01/01/2016 Document Reviewed: 12/07/2012 Elsevier Interactive Patient Education  2017 [employer].     Echocardiogram An echocardiogram, or echocardiography, uses sound waves  (ultrasound) to produce an image of your heart. The echocardiogram is simple, painless, obtained within a short period of time, and offers valuable information to your health care provider. The images from an echocardiogram can provide information such as:  Evidence of coronary artery disease (CAD).  Heart size.  Heart muscle function.  Heart valve function.  Aneurysm detection.  Evidence of a past heart attack.  Fluid buildup around the heart.  Heart muscle thickening.  Assess heart valve function. Tell a health care provider about:  Any allergies you have.  All medicines you are taking, including vitamins, herbs, eye drops, creams, and over-the-counter medicines.  Any problems you or family members have had with anesthetic medicines.  Any blood disorders you have.  Any surgeries you have had.  Any medical conditions you have.  Whether you are pregnant or may be pregnant. What happens before the procedure? No special preparation is needed. Eat and drink normally. What happens during the procedure?  In order to produce an image of your heart, gel will be applied to your chest and a wand-like tool (transducer) will be moved over your chest. The gel will help transmit the sound waves from the transducer. The sound waves will harmlessly bounce off your heart to allow the heart images to be captured in real-time motion. These images will then be recorded.  You may need an IV to receive a medicine that improves the quality of the pictures. What happens after the procedure? You may return to your normal schedule including diet, activities, and medicines, unless your health care provider tells you otherwise. This information is not intended to replace advice given to you by your health care provider. Make sure you discuss any questions you have with your health care provider. Document Released: 12/27/1999 Document Revised: 08/17/2015 Document Reviewed: 09/05/2012 Elsevier  Interactive Patient Education  2017 [employer].

## 2016-05-07 ENCOUNTER — Telehealth: Payer: Self-pay | Admitting: Internal Medicine

## 2016-05-07 NOTE — Telephone Encounter (Signed)
Patient took [employer] monitor off.  He says it will not hold a charge .  Patient did  Not attempt to call [employer] .  In office lobby  rn jennifer aware

## 2016-05-07 NOTE — Telephone Encounter (Signed)
Patient presented to office with his [employer] Heart Monitor. Says that the phone that comes with the monitor worked most of the night and then this morning it would not turn on. He had it on the charger all night and it still won't power on . The monitor part that connects to the patient is working and placed back onto the patient. Phone plugged into charger here in the office and still it will not power on after several minutes of charging.  Called [employer] to help with trouble shooting. Spoke with the Technical Support team.  After over 5 minutes of the phone charging in the office, it still will not power on. In talking with the representative, still unable to turn on phone. They will overnight a new phone and charger directly to the patient. Patient will call [employer] once he receives it and goes to hook it up and they will make sure everything is working right. Patient address verified and patient verbalized understanding of instructions. Monitor removed from patient at this time as directed by [employer] because it will not be able to monitor at this time.

## 2016-05-08 ENCOUNTER — Ambulatory Visit (INDEPENDENT_AMBULATORY_CARE_PROVIDER_SITE_OTHER): Payer: Medicare Other

## 2016-05-08 DIAGNOSIS — I2510 Atherosclerotic heart disease of native coronary artery without angina pectoris: Secondary | ICD-10-CM

## 2016-05-08 DIAGNOSIS — R55 Syncope and collapse: Secondary | ICD-10-CM | POA: Diagnosis not present

## 2016-05-11 ENCOUNTER — Encounter: Payer: Self-pay | Admitting: Family Medicine

## 2016-05-11 ENCOUNTER — Ambulatory Visit (INDEPENDENT_AMBULATORY_CARE_PROVIDER_SITE_OTHER): Payer: Medicare Other | Admitting: Family Medicine

## 2016-05-11 VITALS — BP 116/68 | HR 77 | Temp 98.1°F | Wt 228.2 lb

## 2016-05-11 DIAGNOSIS — E119 Type 2 diabetes mellitus without complications: Secondary | ICD-10-CM | POA: Diagnosis not present

## 2016-05-11 DIAGNOSIS — R55 Syncope and collapse: Secondary | ICD-10-CM

## 2016-05-11 DIAGNOSIS — Z8679 Personal history of other diseases of the circulatory system: Secondary | ICD-10-CM | POA: Diagnosis not present

## 2016-05-11 DIAGNOSIS — I2510 Atherosclerotic heart disease of native coronary artery without angina pectoris: Secondary | ICD-10-CM

## 2016-05-11 DIAGNOSIS — I10 Essential (primary) hypertension: Secondary | ICD-10-CM | POA: Diagnosis not present

## 2016-05-11 MED ORDER — NITROGLYCERIN 0.4 MG SL SUBL: 0.4000 mg | SUBLINGUAL_TABLET | SUBLINGUAL | 0 refills | Status: DC | PRN

## 2016-05-11 NOTE — Assessment & Plan Note (Signed)
Rare episodes of central chest tightness that have been chronic since he had stents placed in 2015. Has not worsened. Asymptomatic currently. No other symptoms with it. He is planning to have a stress test this Friday given recent syncope episodes. We'll refill his nitroglycerin as he reports it is out of date. Discussed appropriate use of this. Advised to return precautions.

## 2016-05-11 NOTE — Progress Notes (Signed)
Tommi Rumps, MD Phone: [phone]  Walter Salas is a 61 y.o. male who presents today for f/u.  DIABETES Disease Monitoring: Blood Sugar ranges-140-150's, single sugar of 118 Polyuria/phagia/dipsia- some polyuria      Visual problems- no Medications: Compliance- taking metformin Hypoglycemic symptoms- none since coming off glipizide  HYPERTENSION  Disease Monitoring  Home BP Monitoring 120's/60's Chest pain- see below    Dyspnea- no Medications  Compliance-  Taking chlorthalidone, losartan. Lightheadedness-  See below  Edema- no  Patient notes occasional episodes of central chest tightness. These have been going on since he had stents placed many years ago. They have not worsened or become more frequent. Notes it feels like a compression in his chest. Lasts a few minutes and goes away on its own. Only occurs when he sitting down. No exertional component. No radiation. No shortness of breath with this. No diaphoresis. He has not had to take nitroglycerin for this. Recent EKG with sinus rhythm with first-degree AV block though no ischemic changes.  Syncope: Patient has been seen by cardiology. He is wearing a heart monitor and is scheduled for a stress test and echo. He has not been driving. He has not had any further syncopal episodes. He did have one episode where he felt quite lightheaded. This resolved fairly quickly.  History of subdural hematoma: Patient notes a history of this after falling off a ladder. He notes no headaches, numbness, weakness, or vision changes. He does not get on ladders anymore.   PMH: smoker   ROS see history of present illness  Objective  Physical Exam Vitals:   05/11/16 0836  BP: 116/68  Pulse: 77  Temp: 98.1 F (36.7 C)    BP Readings from Last 3 Encounters:  05/11/16 116/68  05/06/16 106/60  04/20/16 118/62   Wt Readings from Last 3 Encounters:  05/11/16 228 lb 3.2 oz (103.5 kg)  05/06/16 228 lb 12 oz (103.8 kg)  04/20/16 229 lb  12.8 oz (104.2 kg)    Physical Exam  Constitutional: No distress.  HENT:  Head: Normocephalic and atraumatic.  Cardiovascular: Normal rate, regular rhythm and normal heart sounds.   Pulmonary/Chest: Effort normal and breath sounds normal.  Musculoskeletal: He exhibits no edema.  Neurological: He is alert. Gait normal.  CN 2-12 intact, 5/5 strength in bilateral biceps, triceps, grip, quads, hamstrings, plantar and dorsiflexion, sensation to light touch intact in bilateral UE and LE  Skin: Skin is warm and dry. He is not diaphoretic.     Assessment/Plan: Please see individual problem list.  History of subdural hematoma Prior history of subdural hematoma. Neurologically intact. Encouraged to avoid getting on ladders.  Diabetes mellitus, type 2 ([place]) Home sugars decently controlled. He'll return in a week or so for an A1c. Could consider addition of Jardiance or Januvia if A1c is not well controlled.  Syncope No further episodes. Currently undergoing evaluation through cardiology. He'll complete this evaluation.  Coronary artery disease Rare episodes of central chest tightness that have been chronic since he had stents placed in 2015. Has not worsened. Asymptomatic currently. No other symptoms with it. He is planning to have a stress test this Friday given recent syncope episodes. We'll refill his nitroglycerin as he reports it is out of date. Discussed appropriate use of this. Advised to return precautions.  Essential hypertension, benign At goal. Continue current medications.   Orders Placed This Encounter  Procedures  . POCT HgB A1C    Standing Status:   Future  Standing Expiration Date:   07/11/2016    Meds ordered this encounter  Medications  . nitroGLYCERIN (NITROSTAT) 0.4 MG SL tablet    Sig: Place 1 tablet (0.4 mg total) under the tongue every 5 (five) minutes as needed for chest pain. May take up to 3 doses.    Dispense:  30 tablet    Refill:  0   Tommi Rumps, MD [place]

## 2016-05-11 NOTE — Assessment & Plan Note (Signed)
Home sugars decently controlled. He'll return in a week or so for an A1c. Could consider addition of Jardiance or Januvia if A1c is not well controlled.

## 2016-05-11 NOTE — Assessment & Plan Note (Signed)
At goal. Continue current medications. 

## 2016-05-11 NOTE — Patient Instructions (Signed)
Nice to see you. We'll have you come back sometime next week for an A1c. Please monitor for further episodes of passing out. If these occur please seek medical attention immediately. We will send in a nitroglycerin prescription for you. If you develop chest tightness you may take this. If you have to take this for 3 doses you need to be evaluated immediately.

## 2016-05-11 NOTE — Assessment & Plan Note (Signed)
Prior history of subdural hematoma. Neurologically intact. Encouraged to avoid getting on ladders.

## 2016-05-11 NOTE — Assessment & Plan Note (Signed)
No further episodes. Currently undergoing evaluation through cardiology. He'll complete this evaluation.

## 2016-05-11 NOTE — Progress Notes (Signed)
Pre visit review using our clinic review tool, if applicable. No additional management support is needed unless otherwise documented below in the visit note. 

## 2016-05-15 ENCOUNTER — Ambulatory Visit
Admission: RE | Admit: 2016-05-15 | Discharge: 2016-05-15 | Disposition: A | Discharge status: Home / Self Care | Payer: Medicare Other | Source: Ambulatory Visit | Attending: Internal Medicine | Admitting: Internal Medicine

## 2016-05-15 DIAGNOSIS — I2510 Atherosclerotic heart disease of native coronary artery without angina pectoris: Secondary | ICD-10-CM | POA: Diagnosis not present

## 2016-05-15 DIAGNOSIS — R55 Syncope and collapse: Secondary | ICD-10-CM | POA: Diagnosis not present

## 2016-05-15 LAB — NM MYOCAR MULTI W/SPECT W/WALL MOTION / EF
CHL CUP NUCLEAR SRS: 4
CHL CUP NUCLEAR SSS: 6
CSEPED: 0 min
CSEPEDS: 0 s
CSEPHR: 58 %
Estimated workload: 1 METS
LV sys vol: 48 mL
LVDIAVOL: 102 mL (ref 62–150)
MPHR: 158 {beats}/min
Peak HR: 93 {beats}/min
Rest HR: 71 {beats}/min
SDS: 1
TID: 1.25

## 2016-05-15 MED ORDER — TECHNETIUM TC 99M TETROFOSMIN IV KIT
14.0600 | PACK | Freq: Once | INTRAVENOUS | Status: AC | PRN
  Administered 2016-05-15: 14.06 via INTRAVENOUS

## 2016-05-15 MED ORDER — REGADENOSON 0.4 MG/5ML IV SOLN
0.4000 mg | Freq: Once | INTRAVENOUS | Status: AC
  Administered 2016-05-15: 0.4 mg via INTRAVENOUS

## 2016-05-15 MED ORDER — TECHNETIUM TC 99M TETROFOSMIN IV KIT
29.7700 | PACK | Freq: Once | INTRAVENOUS | Status: AC | PRN
  Administered 2016-05-15: 29.77 via INTRAVENOUS

## 2016-05-18 ENCOUNTER — Ambulatory Visit (INDEPENDENT_AMBULATORY_CARE_PROVIDER_SITE_OTHER): Payer: Medicare Other

## 2016-05-18 ENCOUNTER — Other Ambulatory Visit: Payer: Self-pay

## 2016-05-18 ENCOUNTER — Telehealth: Payer: Self-pay | Admitting: Radiology

## 2016-05-18 DIAGNOSIS — R55 Syncope and collapse: Secondary | ICD-10-CM | POA: Diagnosis not present

## 2016-05-18 DIAGNOSIS — I2510 Atherosclerotic heart disease of native coronary artery without angina pectoris: Secondary | ICD-10-CM | POA: Diagnosis not present

## 2016-05-18 NOTE — Telephone Encounter (Signed)
Opened in Error.

## 2016-05-19 ENCOUNTER — Other Ambulatory Visit (INDEPENDENT_AMBULATORY_CARE_PROVIDER_SITE_OTHER): Payer: Medicare Other

## 2016-05-19 DIAGNOSIS — E119 Type 2 diabetes mellitus without complications: Secondary | ICD-10-CM

## 2016-05-19 LAB — HEMOGLOBIN A1C: HEMOGLOBIN A1C: 7 % — AB (ref 4.6–6.5)

## 2016-05-19 NOTE — Addendum Note (Signed)
Addended by: Arby Barrette on: 05/19/2016 09:28 AM   Modules accepted: Orders

## 2016-06-23 ENCOUNTER — Encounter: Payer: Self-pay | Admitting: Internal Medicine

## 2016-06-23 ENCOUNTER — Other Ambulatory Visit
Admission: RE | Admit: 2016-06-23 | Discharge: 2016-06-23 | Disposition: A | Discharge status: Home / Self Care | Payer: Medicare Other | Source: Ambulatory Visit | Attending: Internal Medicine | Admitting: Internal Medicine

## 2016-06-23 ENCOUNTER — Ambulatory Visit (INDEPENDENT_AMBULATORY_CARE_PROVIDER_SITE_OTHER): Payer: Medicare Other | Admitting: Internal Medicine

## 2016-06-23 VITALS — BP 108/60 | HR 74 | Ht 70.0 in | Wt 222.5 lb

## 2016-06-23 DIAGNOSIS — I7789 Other specified disorders of arteries and arterioles: Secondary | ICD-10-CM | POA: Diagnosis not present

## 2016-06-23 DIAGNOSIS — I77810 Thoracic aortic ectasia: Secondary | ICD-10-CM | POA: Diagnosis not present

## 2016-06-23 DIAGNOSIS — R55 Syncope and collapse: Secondary | ICD-10-CM | POA: Diagnosis not present

## 2016-06-23 DIAGNOSIS — I10 Essential (primary) hypertension: Secondary | ICD-10-CM

## 2016-06-23 DIAGNOSIS — Z01818 Encounter for other preprocedural examination: Secondary | ICD-10-CM | POA: Diagnosis not present

## 2016-06-23 DIAGNOSIS — E782 Mixed hyperlipidemia: Secondary | ICD-10-CM

## 2016-06-23 DIAGNOSIS — I2510 Atherosclerotic heart disease of native coronary artery without angina pectoris: Secondary | ICD-10-CM | POA: Insufficient documentation

## 2016-06-23 LAB — BASIC METABOLIC PANEL
ANION GAP: 11 (ref 5–15)
BUN: 14 mg/dL (ref 6–20)
CALCIUM: 9.4 mg/dL (ref 8.9–10.3)
CO2: 30 mmol/L (ref 22–32)
Chloride: 97 mmol/L — ABNORMAL LOW (ref 101–111)
Creatinine, Ser: 0.77 mg/dL (ref 0.61–1.24)
Glucose, Bld: 108 mg/dL — ABNORMAL HIGH (ref 65–99)
Potassium: 3.4 mmol/L — ABNORMAL LOW (ref 3.5–5.1)
Sodium: 138 mmol/L (ref 135–145)

## 2016-06-23 NOTE — Patient Instructions (Signed)
Medication Instructions:  Your physician recommends that you continue on your current medications as directed. Please refer to the Current Medication list given to you today.   Labwork: Your physician recommends that you return for lab work in: TODAY if possible (BMP).   Testing/Procedures: Non-Cardiac CT Angiography (CTA) of the chest, is a special type of CT scan that uses a computer to produce multi-dimensional views of major blood vessels throughout the body. In CT angiography, a contrast material is injected through an IV to help visualize the blood vessels.  SCHEDULED ON Monday, June 29, 2016, ARRIVAL TIME OF 07:45 AM. - Clear liquids ONLY for 4 hours prior to procedure. - Location: [facility] [address]   [place], [place] [address]    Follow-Up: Your physician wants you to follow-up in: [place]. You will receive a reminder letter in the mail two months in advance. If you don't receive a letter, please call our office to schedule the follow-up appointment.   If you need a refill on your cardiac medications before your next appointment, please call your pharmacy.   CT Angiogram A CT angiogram is a procedure to look at the blood vessels in various areas of the body. For this procedure, a large X-ray machine, called a CT scanner, takes detailed pictures of blood vessels that have been injected with a dye (contrast material). A CT angiogram allows your health care provider to see how well blood is flowing to the area of your body that is being checked. Your health care provider will be able to see if there are any problems, such as a blockage. Tell a health care provider about:  Any allergies you have.  All medicines you are taking, including vitamins, herbs, eye drops, creams, and over-the-counter medicines.  Any problems you or family members have had with anesthetic medicines.  Any blood  disorders you have.  Any surgeries you have had.  Any medical conditions you have.  Whether you are pregnant or may be pregnant.  Whether you are breastfeeding.  Any anxiety disorders, chronic pain, or other conditions you have that may increase your stress or prevent you from lying still. What are the risks? Generally, this is a safe procedure. However, problems may occur, including:  Infection.  Bleeding.  Allergic reactions to medicines or dyes.  Damage to other structures or organs.  Kidney damage from the dye or contrast that is used.  Increased risk of cancer from radiation exposure. This risk is low. Talk with your health care provider about: ? The risks and benefits of testing. ? How you can receive the lowest dose of radiation.  What happens before the procedure?  Wear comfortable clothing and remove any jewelry.  Follow instructions from your health care provider about eating and drinking. For most people, instructions may include these actions: ? For 12 hours before the test, avoid caffeine. This includes tea, coffee, soda, and energy drinks or pills. ? For 3-4 hours before the test, stop eating or drinking anything but water. ? Stay well hydrated by continuing to drink water before the exam. This will help to clear the contrast dye from your body after the test.  Ask your health care provider about changing or stopping your regular medicines. This is especially important if you are taking diabetes medicines or blood thinners. What happens during the procedure?  An IV tube will be inserted into one of your veins.  You will be asked to lie on an exam table. This table will slide in and out of the CT machine during the procedure.  Contrast dye will be injected into the IV tube. You might feel warm, or you may get a metallic taste in your mouth.  The table that you are lying on will move into the CT machine tunnel for the scan.  The person running the machine  will give you instructions while the scans are being done. You may be asked to: ? Keep your arms above your head. ? Hold your breath. ? Stay very still, even if the table is moving.  When the scanning is complete, you will be moved out of the machine.  The IV tube will be removed. The procedure may vary among health care providers and hospitals. What happens after the procedure?  You might feel warm, or you may get a metallic taste in your mouth.  You may be asked to drink water or other fluids to wash (flush) the contrast material out of your body.  It is up to you to get the results of your procedure. Ask your health care provider, or the department that is doing the procedure, when your results will be ready. Summary  A CT angiogram is a procedure to look at the blood vessels in various areas of the body.  You will need to stay very still during the exam.  You may be asked to drink water or other fluids to wash (flush) the contrast material out of your body after your scan. This information is not intended to replace advice given to you by your health care provider. Make sure you discuss any questions you have with your health care provider. Document Released: 08/29/2015 Document Revised: 08/29/2015 Document Reviewed: 08/29/2015 Elsevier Interactive Patient Education  [employer].

## 2016-06-23 NOTE — Progress Notes (Signed)
Follow-up Outpatient Visit Date: 06/23/2016  Primary Care Provider: Leone Haven, MD [address] [place] [id number]  Chief Complaint: Follow-up syncope  HPI:  Walter Salas is a 61 y.o. year-old male with history of coronary artery disease status post PCI's to the LCx and RCA in 2014, hypertension, hyperlipidemia, type 2 diabetes mellitus, depression, kidney stones, and fall with intracranial hemorrhage, who presents for follow-up of syncope. I first met him on 05/06/16 after several syncopal episodes in the preceding month. We subsequently obtained a transthoracic echocardiogram, 30 day event monitor, and myocardial perfusion stress test. No clear cause for his syncope was identified. Of note, metoprolol and glipizide were discontinued. Trazodone has also been weaned. Since our last visit, Walter Salas has not had any further syncopal or near syncopal episodes. He has had 2 periods where he felt a little bit tired after working outside. He has not had any shortness of breath, palpitations, or lightheadedness. He describes occasional vague chest discomfort that typically occurs at rest; he equates this to gas. He took a nitroglycerin once after her last visit without any noticeable improvement in his pain. He notes that the discomfort is distinctly different than what he experienced at the time of his stent placements in 2014. He reports being compliant with his medications. He has not been driving. He notes that his blood pressure is typically well controlled, ranging in the 110s/70s.  --------------------------------------------------------------------------------------------------  Cardiovascular History & Procedures: Cardiovascular Problems:  Syncope  Coronary artery disease status post PCI  Risk Factors:  Known coronary artery disease, hypertension, hyperlipidemia, diabetes mellitus, male gender, age greater than 66, and tobacco use  Cath/PCI:  Staged PCI's of  the [place] (08/2012 and 09/2012) with drug-eluting stent placement to the mid LCx followed by the mid/distal RCA.  CV Surgery:  None  EP Procedures and Devices:  30-day event monitor (05/08/16): Sinus rhythm with first-degree AV block. No significant arrhythmias or pauses.  Non-Invasive Evaluation(s):  TTE (05/18/16): Normal LV size and contraction. LVEF 60-65% with normal wall motion. Grade 1 diastolic dysfunction. Mild aortic valve calcification and mildly dilated aortic root, measuring up to 4.2 cm. Normal RV size and function. Normal pulmonary artery pressure.  Pharmacologic MPI (05/15/16): Low risk study with small in size, mild in severity, fixed basal anterolateral defect. No ischemia. LVEF 55-65%.  Recent CV Pertinent Labs: Lab Results  Component Value Date   CHOL 131 08/15/2015   HDL 27.60 (L) 08/15/2015   LDLCALC 60 10/24/2014   LDLDIRECT 50.0 08/15/2015   TRIG 361.0 (H) 08/15/2015   CHOLHDL 5 08/15/2015   K 4.2 04/20/2016   BUN 18 04/20/2016   CREATININE 0.90 04/20/2016    Past medical and surgical history were reviewed and updated in EPIC.  Outpatient Encounter Prescriptions as of 06/23/2016  Medication Sig  . aspirin 81 MG tablet Take 81 mg by mouth daily.  Marland Kitchen atorvastatin (LIPITOR) 80 MG tablet Take 1 tablet (80 mg total) by mouth daily.  . chlorthalidone (HYGROTON) 50 MG tablet Take 25 mg by mouth daily.  . cholecalciferol (VITAMIN D) 1000 UNITS tablet Take 1 tablet (1,000 Units total) by mouth QID.  . DULoxetine (CYMBALTA) 20 MG capsule Take 20 mg by mouth daily.  . fenofibrate (TRICOR) 145 MG tablet Take 1 tablet (145 mg total) by mouth daily.  . hydroxypropyl methylcellulose (ISOPTO TEARS) 2.5 % ophthalmic solution Place 1 drop into both eyes as needed for dry eyes.  Marland Kitchen lidocaine (LIDODERM) 5 % Place 1 patch  onto the skin daily. Remove & Discard patch within 12 hours or as directed by MD  . losartan (COZAAR) 25 MG tablet Take 25 mg by mouth daily.  . metFORMIN  (GLUCOPHAGE) 1000 MG tablet Take 1,000 mg by mouth 2 (two) times daily with a meal.  . methocarbamol (ROBAXIN) 500 MG tablet Take 500 mg by mouth as directed.  . nitroGLYCERIN (NITROSTAT) 0.4 MG SL tablet Place 1 tablet (0.4 mg total) under the tongue every 5 (five) minutes as needed for chest pain. May take up to 3 doses.  Marland Kitchen omeprazole (PRILOSEC) 20 MG capsule Take 20 mg by mouth daily.  . pregabalin (LYRICA) 50 MG capsule Take 50 mg by mouth 3 (three) times daily.  . traMADol (ULTRAM) 50 MG tablet TAKE 2 TABLETS BY MOUTH 3 TIMES DAILY AS NEEDED  . traZODone (DESYREL) 100 MG tablet Take 3 tablets (300 mg total) by mouth at bedtime as needed for sleep.  . vitamin B-12 (CYANOCOBALAMIN) 1000 MCG tablet Take 1 tablet (1,000 mcg total) by mouth daily.  . [DISCONTINUED] chlorthalidone (HYGROTON) 50 MG tablet Take 50 mg by mouth daily.   No facility-administered encounter medications on file as of 06/23/2016.     Allergies: Gabapentin  Social History   Social History  . Marital status: Married    Spouse name: N/A  . Number of children: N/A  . Years of education: N/A   Occupational History  . Not on file.   Social History Main Topics  . Smoking status: Current Every Day Smoker    Packs/day: 1.00    Years: 50.00    Types: Cigarettes  . Smokeless tobacco: Never Used  . Alcohol use No  . Drug use: No  . Sexual activity: No   Other Topics Concern  . Not on file   Social History Narrative   Lives in [place] with wife. Has cat.   Work - Database administrator, retired      Followed at [employer].      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      Diet - Regular   Exercise - starting back at gym    Family History  Problem Relation Age of Onset  . Diabetes Mother   . Heart disease Father   . Diabetes Father   . Diabetes Sister   . Heart disease Sister   . Diabetes Brother   . Heart disease Paternal Grandfather     Review of Systems: A 12-system review of  systems was performed and was negative except as noted in the HPI.  --------------------------------------------------------------------------------------------------  Physical Exam: BP (!) 104/50 (BP Location: Left Arm, Patient Position: Sitting, Cuff Size: Normal)   Pulse 74   Ht _0  (1.778 m)   Wt 222 lb 8 oz (100.9 kg)   BMI 31.93 kg/m   Repeat BP 108/60  General:  Obese man, seated comfortably in the exam room. He is accompanied by his wife. HEENT: No conjunctival pallor or scleral icterus.  Moist mucous membranes.  OP clear. Neck: Supple without lymphadenopathy, thyromegaly, JVD, or HJR.  No carotid bruit. Lungs: Normal work of breathing.  Clear to auscultation bilaterally without wheezes or crackles. Heart: Regular rate and rhythm without murmurs, rubs, or gallops.  Non-displaced PMI. Abd: Bowel sounds present.  Soft, NT/ND without hepatosplenomegaly Ext: No lower extremity edema.  Radial, PT, and DP pulses are 2+ bilaterally. Skin: Warm and dry without rash.  EKG:  NSR with 1st degree AV block. No other  significant abnormalities.  Lab Results  Component Value Date   WBC 7.7 04/20/2016   HGB 14.3 04/20/2016   HCT 41.6 04/20/2016   MCV 82.5 04/20/2016   PLT 193.0 04/20/2016    Lab Results  Component Value Date   NA 136 04/20/2016   K 4.2 04/20/2016   CL 99 04/20/2016   CO2 31 04/20/2016   BUN 18 04/20/2016   CREATININE 0.90 04/20/2016   GLUCOSE 134 (H) 04/20/2016   ALT 14 04/20/2016    Lab Results  Component Value Date   CHOL 131 08/15/2015   HDL 27.60 (L) 08/15/2015   LDLCALC 60 10/24/2014   LDLDIRECT 50.0 08/15/2015   TRIG 361.0 (H) 08/15/2015   CHOLHDL 5 08/15/2015    --------------------------------------------------------------------------------------------------  ASSESSMENT AND PLAN: Syncope No recurrent events since our last visit. Workup including echo, myocardial perfusion stress test, and 30-day event monitor have not revealed any cause of  his syncope. It may have been medication induced, given that he was on additional antihypertensive medications and his blood pressure has been low normal. We have discussed risks and benefits of further evaluation and have agreed to defer additional testing unless he has further syncopal episodes. I have advised him to refrain from driving until it has been at least 6 months since his last syncopal episode. If he has a recurrent event, we will need to consider EP referral for consideration of implantable loop recorder.  Coronary artery disease with atypical chest pain Walter Salas continues to have occasional episodes of atypical chest pain. Myocardial perfusion stress test last month did not show any ischemia. Small area of scar may be related to remote MI at the time of stenting. No further workup at this time.  Dilated aortic root Mildly dilated aortic root noted by echocardiogram. We will obtain a CTA of the chest for better characterization. We will continue with blood pressure and lipid control, as below. We will check a basic metabolic panel today in anticipation of IV contrast administration with ECT.  Hypertension Blood pressure is lower normal today. We will not make any medication changes at this time.  Hyperlipidemia Most recent LDL was 50 and 08/2015, with triglycerides of 361. Continue atorvastatin and fenofibrate at current doses.  Follow-up: Return to clinic in 6 months.  Nelva Bush, MD 06/23/2016 2:16 PM

## 2016-06-25 ENCOUNTER — Other Ambulatory Visit: Payer: Self-pay | Admitting: *Deleted

## 2016-06-25 MED ORDER — POTASSIUM CHLORIDE CRYS ER 20 MEQ PO TBCR: 20.0000 meq | EXTENDED_RELEASE_TABLET | Freq: Every day | ORAL | 3 refills | Status: DC

## 2016-06-29 ENCOUNTER — Ambulatory Visit
Admission: RE | Admit: 2016-06-29 | Discharge: 2016-06-29 | Disposition: A | Discharge status: Home / Self Care | Payer: Medicare Other | Source: Ambulatory Visit | Attending: Internal Medicine | Admitting: Internal Medicine

## 2016-06-29 ENCOUNTER — Other Ambulatory Visit: Payer: Self-pay | Admitting: *Deleted

## 2016-06-29 DIAGNOSIS — D1779 Benign lipomatous neoplasm of other sites: Secondary | ICD-10-CM | POA: Diagnosis not present

## 2016-06-29 DIAGNOSIS — I77810 Thoracic aortic ectasia: Secondary | ICD-10-CM | POA: Insufficient documentation

## 2016-06-29 DIAGNOSIS — I700 Atherosclerosis of aorta: Secondary | ICD-10-CM | POA: Diagnosis not present

## 2016-06-29 DIAGNOSIS — E041 Nontoxic single thyroid nodule: Secondary | ICD-10-CM | POA: Insufficient documentation

## 2016-06-29 DIAGNOSIS — R079 Chest pain, unspecified: Secondary | ICD-10-CM | POA: Diagnosis not present

## 2016-06-29 MED ORDER — IOPAMIDOL (ISOVUE-370) INJECTION 76%
75.0000 mL | Freq: Once | INTRAVENOUS | Status: AC | PRN
  Administered 2016-06-29: 75 mL via INTRAVENOUS

## 2016-07-13 ENCOUNTER — Other Ambulatory Visit: Payer: Self-pay | Admitting: Family Medicine

## 2016-07-13 DIAGNOSIS — E041 Nontoxic single thyroid nodule: Secondary | ICD-10-CM

## 2016-07-21 ENCOUNTER — Ambulatory Visit: Payer: Medicare Other | Admitting: Family Medicine

## 2016-07-23 ENCOUNTER — Ambulatory Visit
Admission: RE | Admit: 2016-07-23 | Discharge: 2016-07-23 | Disposition: A | Discharge status: Home / Self Care | Payer: Medicare Other | Source: Ambulatory Visit | Attending: Family Medicine | Admitting: Family Medicine

## 2016-07-23 ENCOUNTER — Other Ambulatory Visit: Payer: Self-pay | Admitting: Family Medicine

## 2016-07-23 DIAGNOSIS — E042 Nontoxic multinodular goiter: Secondary | ICD-10-CM | POA: Diagnosis not present

## 2016-07-23 DIAGNOSIS — E041 Nontoxic single thyroid nodule: Secondary | ICD-10-CM

## 2016-07-28 ENCOUNTER — Telehealth: Payer: Self-pay | Admitting: Family Medicine

## 2016-07-28 NOTE — Telephone Encounter (Signed)
A copy of pt appt was mailed out on 07/17 for the [facility].

## 2016-07-28 NOTE — Telephone Encounter (Signed)
noted 

## 2016-08-05 ENCOUNTER — Telehealth: Payer: Self-pay | Admitting: Internal Medicine

## 2016-08-05 DIAGNOSIS — E041 Nontoxic single thyroid nodule: Secondary | ICD-10-CM | POA: Diagnosis not present

## 2016-08-05 NOTE — Telephone Encounter (Signed)
Sandy from Dr Reola Mosher office returning our call  Please call back

## 2016-08-05 NOTE — Telephone Encounter (Signed)
No answer. Left message to call back.   

## 2016-08-05 NOTE — Telephone Encounter (Signed)
Dr. Virgia Land would like to do a biopsy, and needs to know if pt needs to stop aspirin for biopsy. Please advise. Ok to leave detailed message on her VM

## 2016-08-05 NOTE — Telephone Encounter (Signed)
I would prefer for Mr. Icenogle to remain on low-dose aspirin. If Dr. Richardson Landry feels that there is a high risk of bleeding with the biopsy and it cannot be performed on aspirin, then aspirin should be discontinued 5 days prior to the procedure and restarted as soon as it is felt safe to do so from a procedural standpoint. Thanks.  Nelva Bush, MD [facility] Pager: [phone]

## 2016-08-05 NOTE — Telephone Encounter (Signed)
No answer to [place]. Left message to call back.

## 2016-08-05 NOTE — Telephone Encounter (Signed)
Received incoming call from [place]. Patient is going to have an Ultrasound guided biopsy of the thyroid. No anesthesia. Dr Richardson Landry would like to know if it is ok for patient to come of aspirin prior to procedure and for how long. May fax encounter to Attn: [place] at [phone] when complete.  Will route to Dr End for advice.

## 2016-08-06 NOTE — Telephone Encounter (Signed)
Encounter routed to Attn: [place] at [phone].

## 2016-08-07 ENCOUNTER — Other Ambulatory Visit: Payer: Self-pay | Admitting: Otolaryngology

## 2016-08-07 DIAGNOSIS — E041 Nontoxic single thyroid nodule: Secondary | ICD-10-CM

## 2016-08-11 ENCOUNTER — Encounter: Payer: Self-pay | Admitting: Family Medicine

## 2016-08-11 ENCOUNTER — Ambulatory Visit (INDEPENDENT_AMBULATORY_CARE_PROVIDER_SITE_OTHER): Payer: Medicare Other | Admitting: Family Medicine

## 2016-08-11 ENCOUNTER — Ambulatory Visit
Admission: RE | Admit: 2016-08-11 | Discharge: 2016-08-11 | Disposition: A | Discharge status: Home / Self Care | Payer: Medicare Other | Source: Ambulatory Visit | Attending: Otolaryngology | Admitting: Otolaryngology

## 2016-08-11 VITALS — BP 124/80 | HR 75 | Temp 98.6°F | Wt 216.4 lb

## 2016-08-11 DIAGNOSIS — E119 Type 2 diabetes mellitus without complications: Secondary | ICD-10-CM | POA: Diagnosis not present

## 2016-08-11 DIAGNOSIS — E041 Nontoxic single thyroid nodule: Secondary | ICD-10-CM | POA: Diagnosis not present

## 2016-08-11 DIAGNOSIS — B351 Tinea unguium: Secondary | ICD-10-CM | POA: Diagnosis not present

## 2016-08-11 DIAGNOSIS — I10 Essential (primary) hypertension: Secondary | ICD-10-CM

## 2016-08-11 DIAGNOSIS — G609 Hereditary and idiopathic neuropathy, unspecified: Secondary | ICD-10-CM

## 2016-08-11 DIAGNOSIS — E782 Mixed hyperlipidemia: Secondary | ICD-10-CM

## 2016-08-11 DIAGNOSIS — R55 Syncope and collapse: Secondary | ICD-10-CM | POA: Diagnosis not present

## 2016-08-11 NOTE — Discharge Instructions (Signed)
Thyroid Biopsy, Care After °Refer to this sheet in the next few weeks. These instructions provide you with information on caring for yourself after your procedure. Your health care provider may also give you more specific instructions. Your treatment has been planned according to current medical practices, but problems sometimes occur. Call your health care provider if you have any problems or questions after your procedure. °What can I expect after the procedure? °After your procedure, it is typical to have the following: °· You may have soreness and tenderness at the biopsy site for a few days. °· You may have a sore throat or a hoarse voice if you had an open biopsy. This should go away after a couple days. ° °Follow these instructions at home: °· Take medicines only as directed by your health care provider. °· To ease discomfort at the biopsy site: °? Keep your head raised on a pillow when you are lying down. °? Support the back of your head and neck with both hands as you sit up from a lying position. °· If you have a sore throat, try using throat lozenges or gargling with warm salt water. °· Keep all follow-up visits as directed by your health care provider. This is important. °Contact a health care provider if: °· You have a fever. °Get help right away if: °· You have severe bleeding from the biopsy site. °· You have difficulty swallowing. °· You have drainage, redness, swelling, or pain at the biopsy site. °· You have swollen glands (lymph nodes) in your neck. °This information is not intended to replace advice given to you by your health care provider. Make sure you discuss any questions you have with your health care provider. °Document Released: 07/26/2013 Document Revised: 09/01/2015 Document Reviewed: 03/23/2013 °Elsevier Interactive Patient Education © 2018 Elsevier Inc. ° °

## 2016-08-11 NOTE — Assessment & Plan Note (Signed)
No further syncope. Cardiac evaluation unremarkable for cause. Potentially could've been medication related with glipizide and metoprolol. Discussed that 6 months from his last syncopal episode would be the end of September. Advised to wait until September was over to start driving again. If he has recurrent episodes he needs to let us know.

## 2016-08-11 NOTE — Assessment & Plan Note (Signed)
At goal. Continue current medications. 

## 2016-08-11 NOTE — Assessment & Plan Note (Signed)
A1c to be checked in about a week. Continue metformin.

## 2016-08-11 NOTE — Patient Instructions (Addendum)
Nice to see you. We will have you return in about a week for fasting lab work. Please continue your current medications. 6 months from your last syncopal episode is the end of September. You should not drive until then.

## 2016-08-11 NOTE — Progress Notes (Signed)
  Tommi Rumps, MD Phone: [phone]  Walter Salas is a 61 y.o. male who presents today for f/u.  HYPERTENSION Disease Monitoring: Blood pressure range-120s/70-80 Chest pain- no      Dyspnea- no Medications: Compliance- taking chlorthalidone, losartan Lightheadedness,Syncope- none since April   Edema- no  DIABETES Disease Monitoring: Blood Sugar ranges-150s Polyuria/phagia/dipsia- no      Optho- UTD Medications: Compliance- taking metformin Hypoglycemic symptoms- no  HYPERLIPIDEMIA Disease Monitoring: See symptoms for Hypertension Medications: Compliance- taking lipitor Right upper quadrant pain- no  Muscle aches- no  Patient has not had any further syncopal episodes since coming off of glipizide and metformin. He notes no lightheadedness. He had an extensive cardiac workup with no cause found. Possibly medication related.  Neuropathy: Currently on Lyrica. This is been significantly beneficial. Previously he was hardly able to walk due to the discomfort in his feet. Now minimal discomfort if any. Discomfort is described as needles on the bottom of his feet.  PMH: Current smoker   ROS see history of present illness  Objective  Physical Exam Vitals:   08/11/16 0800  BP: 124/80  Pulse: 75  Temp: 98.6 F (37 C)    BP Readings from Last 3 Encounters:  08/11/16 124/80  06/23/16 108/60  05/11/16 116/68   Wt Readings from Last 3 Encounters:  08/11/16 216 lb 6.4 oz (98.2 kg)  06/23/16 222 lb 8 oz (100.9 kg)  05/11/16 228 lb 3.2 oz (103.5 kg)    Physical Exam  Constitutional: No distress.  Cardiovascular: Normal rate, regular rhythm and normal heart sounds.   2+ DP pulses  Pulmonary/Chest: Effort normal and breath sounds normal.  Musculoskeletal:  Bilateral feet with no skin changes. Onychomycosis and most of his toenails  Neurological: He is alert. Gait normal.  Skin: He is not diaphoretic.     Assessment/Plan: Please see individual problem  list.  Essential hypertension At goal. Continue current medications.  Syncope No further syncope. Cardiac evaluation unremarkable for cause. Potentially could've been medication related with glipizide and metoprolol. Discussed that 6 months from his last syncopal episode would be the end of September. Advised to wait until September was over to start driving again. If he has recurrent episodes he needs to let us know.  Diabetes mellitus, type 2 (HCC) A1c to be checked in about a week. Continue metformin.  Hereditary and idiopathic peripheral neuropathy Much improved on Lyrica. He'll continue Lyrica.  Onychomycosis Noted on toes. Offered referral to podiatry though patient has tried multiple medications. We'll continue to monitor.  Mixed hyperlipidemia Lipid panel to be checked next week fasting. Continue current medication.   Orders Placed This Encounter  Procedures  . HgB A1c    Standing Status:   Future    Standing Expiration Date:   08/11/2017  . Lipid panel    Standing Status:   Future    Standing Expiration Date:   08/11/2017  . Comp Met (CMET)    Standing Status:   Future    Standing Expiration Date:   08/11/2017   Tommi Rumps, MD [place]

## 2016-08-11 NOTE — Assessment & Plan Note (Signed)
Noted on toes. Offered referral to podiatry though patient has tried multiple medications. We'll continue to monitor.

## 2016-08-11 NOTE — Assessment & Plan Note (Signed)
Lipid panel to be checked next week fasting. Continue current medication.

## 2016-08-11 NOTE — Progress Notes (Signed)
Patient post thyroid biopsy per DR [facility] well.wife at bedside. Post vitals stable post procedure as charted. Discharge teaching done with questions answered.bandade dressing dry and intact,denies complaints at this time.

## 2016-08-11 NOTE — Assessment & Plan Note (Signed)
Much improved on Lyrica. He'll continue Lyrica.

## 2016-08-19 DIAGNOSIS — D440 Neoplasm of uncertain behavior of thyroid gland: Secondary | ICD-10-CM | POA: Diagnosis not present

## 2016-08-27 ENCOUNTER — Other Ambulatory Visit (INDEPENDENT_AMBULATORY_CARE_PROVIDER_SITE_OTHER): Payer: Medicare Other

## 2016-08-27 DIAGNOSIS — E119 Type 2 diabetes mellitus without complications: Secondary | ICD-10-CM

## 2016-08-27 DIAGNOSIS — E782 Mixed hyperlipidemia: Secondary | ICD-10-CM

## 2016-08-27 DIAGNOSIS — I10 Essential (primary) hypertension: Secondary | ICD-10-CM

## 2016-08-27 LAB — COMPREHENSIVE METABOLIC PANEL
ALBUMIN: 4 g/dL (ref 3.5–5.2)
ALT: 17 U/L (ref 0–53)
AST: 15 U/L (ref 0–37)
Alkaline Phosphatase: 62 U/L (ref 39–117)
BUN: 16 mg/dL (ref 6–23)
CALCIUM: 9 mg/dL (ref 8.4–10.5)
CHLORIDE: 97 meq/L (ref 96–112)
CO2: 29 mEq/L (ref 19–32)
Creatinine, Ser: 0.68 mg/dL (ref 0.40–1.50)
GFR: 125.26 mL/min (ref >60.00)
Glucose, Bld: 155 mg/dL — ABNORMAL HIGH (ref 70–99)
POTASSIUM: 3.8 meq/L (ref 3.5–5.1)
Sodium: 134 mEq/L — ABNORMAL LOW (ref 135–145)
Total Bilirubin: 0.5 mg/dL (ref 0.2–1.2)
Total Protein: 6.3 g/dL (ref 6.0–8.3)

## 2016-08-27 LAB — LDL CHOLESTEROL, DIRECT: LDL DIRECT: 78 mg/dL

## 2016-08-27 LAB — LIPID PANEL
CHOLESTEROL: 130 mg/dL (ref 0–200)
HDL: 30.3 mg/dL — AB (ref >39.00)
NonHDL: 99.9
TRIGLYCERIDES: 242 mg/dL — AB (ref 0.0–149.0)
Total CHOL/HDL Ratio: 4
VLDL: 48.4 mg/dL — AB (ref 0.0–40.0)

## 2016-08-27 LAB — HEMOGLOBIN A1C: HEMOGLOBIN A1C: 7.3 % — AB (ref 4.6–6.5)

## 2016-08-31 ENCOUNTER — Other Ambulatory Visit: Payer: Self-pay | Admitting: Family Medicine

## 2016-08-31 DIAGNOSIS — E871 Hypo-osmolality and hyponatremia: Secondary | ICD-10-CM

## 2016-09-01 ENCOUNTER — Encounter: Payer: Self-pay | Admitting: Interventional Radiology

## 2016-09-01 LAB — CYTOLOGY - NON PAP

## 2016-09-04 ENCOUNTER — Other Ambulatory Visit (INDEPENDENT_AMBULATORY_CARE_PROVIDER_SITE_OTHER): Payer: Medicare Other

## 2016-09-04 DIAGNOSIS — E871 Hypo-osmolality and hyponatremia: Secondary | ICD-10-CM | POA: Diagnosis not present

## 2016-09-04 LAB — BASIC METABOLIC PANEL
BUN: 14 mg/dL (ref 6–23)
CALCIUM: 9.1 mg/dL (ref 8.4–10.5)
CO2: 32 mEq/L (ref 19–32)
Chloride: 98 mEq/L (ref 96–112)
Creatinine, Ser: 0.9 mg/dL (ref 0.40–1.50)
GFR: 90.64 mL/min (ref >60.00)
Glucose, Bld: 329 mg/dL — ABNORMAL HIGH (ref 70–99)
POTASSIUM: 3.9 meq/L (ref 3.5–5.1)
SODIUM: 136 meq/L (ref 135–145)

## 2016-10-21 ENCOUNTER — Encounter: Payer: Self-pay | Admitting: Family Medicine

## 2016-10-21 ENCOUNTER — Ambulatory Visit (INDEPENDENT_AMBULATORY_CARE_PROVIDER_SITE_OTHER): Payer: Medicare Other | Admitting: Family Medicine

## 2016-10-21 DIAGNOSIS — S0911XA Strain of muscle and tendon of head, initial encounter: Secondary | ICD-10-CM

## 2016-10-21 NOTE — Assessment & Plan Note (Addendum)
Suspect strain of masseter muscle. No signs of dental infection. No signs of parotitis. No signs of ear infection. No systemic signs of illness. Tetanus vaccination up-to-date and no injuries noted. Most consistent with muscular strain. He'll try hard candies in case there is possible parotid stone though this seems unlikely. Tylenol for discomfort. Warm compresses. He follows up with ENT for his thyroid on Friday. If continuing to have this symptom he will mention this to them. If worsens or if he develops new symptoms or systemic symptoms he'll be evaluated again. Given return precautions.

## 2016-10-21 NOTE — Progress Notes (Signed)
  Tommi Rumps, MD Phone: [phone]  Walter Salas is a 61 y.o. male who presents today for same-day visit.  Patient notes for the last 2-3 days he's had a discomfort in the anterior portion of the masseter muscle on the left side. Notes a sharp discomfort that only occurs when he opens his mouth. He is able to chew with no discomfort. He's had no significant swelling. Notes the discomfort prevents him from opening his mouth fully. He is able to point to a very specific spot where the masseter muscle inserts into the maxilla. He has noted no fevers. No injury. He's had no neck pain or chest pain. He's had no dysphagia. No chills. No palpitations. He does not feel ill. He has had no cuts or injuries with rusty objects. He is up-to-date on his tetanus vaccination. He has had no tooth pain. He's had no parotid swelling. It has not worsened. He does note he follows up with ENT on Friday for thyroid nodule. He has had this biopsied. It does continue to grow per his report. He has no trouble swallowing.   ROS see history of present illness  Objective  Physical Exam Vitals:   10/21/16 1137  BP: (!) 110/58  Pulse: 86  Resp: 16  Temp: 98.6 F (37 C)  SpO2: 97%    BP Readings from Last 3 Encounters:  10/21/16 (!) 110/58  08/11/16 110/64  08/11/16 124/80   Wt Readings from Last 3 Encounters:  08/11/16 216 lb (98 kg)  08/11/16 216 lb 6.4 oz (98.2 kg)  06/23/16 222 lb 8 oz (100.9 kg)    Physical Exam  Constitutional: No distress.  HENT:  Head: Normocephalic and atraumatic.  Mouth/Throat: Oropharynx is clear and moist. No oropharyngeal exudate.  No evidence of gingival swelling or tenderness or tooth tenderness, no evidence of parotid swelling or tenderness, no purulent discharge from the parotid gland, no erythema over the parotid gland, there is tenderness at the anterior insertion aspect of the masseter muscle into the maxilla, there is no palpable defect noted, patient does have  slight decreased range of motion of his jaw  Eyes: Pupils are equal, round, and reactive to light. Conjunctivae are normal.  Neck: Neck supple. No thyromegaly present.  No thyroid tenderness, no erythema over the thyroid  Cardiovascular: Normal rate, regular rhythm and normal heart sounds.   Pulmonary/Chest: Effort normal and breath sounds normal.  Musculoskeletal: He exhibits no edema.  Neurological: He is alert. Gait normal.  Skin: He is not diaphoretic.   normal TMs bilaterally   Assessment/Plan: Please see individual problem list.  Strain of masseter muscle Suspect strain of masseter muscle. No signs of dental infection. No signs of parotitis. No signs of ear infection. No systemic signs of illness. Tetanus vaccination up-to-date and no injuries noted. Most consistent with muscular strain. He'll try hard candies in case there is possible parotid stone though this seems unlikely. Tylenol for discomfort. Warm compresses. He follows up with ENT for his thyroid on Friday. If continuing to have this symptom he will mention this to them. If worsens or if he develops new symptoms or systemic symptoms he'll be evaluated again. Given return precautions.  Tommi Rumps, MD [place]

## 2016-10-21 NOTE — Patient Instructions (Signed)
Nice to see you. Your symptoms potentially could be related to muscular strain or possible irritation of salivary glands. Try eating a hard candy to see if that helps though you can also use warm compresses and take Tylenol to see if that is beneficial. If you develop worsening symptoms redevelop swelling, fevers, chills, palpitations, trouble swallowing, or any new or changing symptoms please seek medical attention immediately.

## 2016-10-29 ENCOUNTER — Other Ambulatory Visit: Payer: Self-pay | Admitting: Internal Medicine

## 2016-10-30 MED ORDER — TRAMADOL HCL 50 MG PO TABS: 100.0000 mg | ORAL_TABLET | Freq: Three times a day (TID) | ORAL | 0 refills | Status: DC | PRN

## 2016-10-30 NOTE — Telephone Encounter (Signed)
Can you check with the pharmacy regarding his dose?

## 2016-10-30 NOTE — Telephone Encounter (Signed)
Dosing confirmed by CMA.

## 2016-10-30 NOTE — Telephone Encounter (Signed)
Last OV was 10/21/16, Last refill was 08/15/2015, #90 no refills, please advise, thanks

## 2016-11-11 ENCOUNTER — Encounter: Payer: Self-pay | Admitting: Family Medicine

## 2016-11-11 ENCOUNTER — Ambulatory Visit (INDEPENDENT_AMBULATORY_CARE_PROVIDER_SITE_OTHER): Payer: Medicare Other | Admitting: Family Medicine

## 2016-11-11 VITALS — BP 120/70 | HR 77 | Temp 98.3°F | Wt 219.4 lb

## 2016-11-11 DIAGNOSIS — I10 Essential (primary) hypertension: Secondary | ICD-10-CM

## 2016-11-11 DIAGNOSIS — G8929 Other chronic pain: Secondary | ICD-10-CM | POA: Diagnosis not present

## 2016-11-11 DIAGNOSIS — M6208 Separation of muscle (nontraumatic), other site: Secondary | ICD-10-CM | POA: Diagnosis not present

## 2016-11-11 DIAGNOSIS — M545 Low back pain: Secondary | ICD-10-CM | POA: Diagnosis not present

## 2016-11-11 DIAGNOSIS — Z860100 Personal history of colon polyps, unspecified: Secondary | ICD-10-CM | POA: Insufficient documentation

## 2016-11-11 DIAGNOSIS — E041 Nontoxic single thyroid nodule: Secondary | ICD-10-CM | POA: Insufficient documentation

## 2016-11-11 DIAGNOSIS — E119 Type 2 diabetes mellitus without complications: Secondary | ICD-10-CM

## 2016-11-11 DIAGNOSIS — G609 Hereditary and idiopathic neuropathy, unspecified: Secondary | ICD-10-CM

## 2016-11-11 DIAGNOSIS — S0911XA Strain of muscle and tendon of head, initial encounter: Secondary | ICD-10-CM

## 2016-11-11 NOTE — Assessment & Plan Note (Signed)
Stable on tramadol. Refills through the [place].

## 2016-11-11 NOTE — Assessment & Plan Note (Signed)
Well-controlled.  Continue current regimen. 

## 2016-11-11 NOTE — Assessment & Plan Note (Signed)
Stable.  Continue Lyrica. 

## 2016-11-11 NOTE — Assessment & Plan Note (Signed)
Asymptomatic  Monitor

## 2016-11-11 NOTE — Assessment & Plan Note (Signed)
Relatively well controlled. He'll return for an A1c. Continue metformin.

## 2016-11-11 NOTE — Assessment & Plan Note (Signed)
Resolved

## 2016-11-11 NOTE — Assessment & Plan Note (Signed)
Patient following with ENT. He will see them as scheduled. Discussed if he has worsening trouble with food sticking or is unable to swallow or has difficulty with breathing he needs to be evaluated sooner.

## 2016-11-11 NOTE — Progress Notes (Signed)
  Tommi Rumps, MD Phone: [phone]  Walter Salas is a 61 y.o. male who presents today for follow-up  Patient is doing well today. Notes the left-sided masseter strain that he had previously has healed. Took about a week. No discomfort now.  Hypertension: Has been well controlled at home. Taking chlorthalidone and losartan. No chest pain, she was breath, or edema.  Diabetes: Typically running around 150. Taking metformin. No polyuria or polydipsia. No hypoglycemia. Is up-to-date on ophthalmology.  Neuropathy: Taking Lyrica for this. Does note burning and tingling in his feet. The Lyrica helped significantly. Takes tramadol for chronic back pain. It does not make him drowsy. He gets that through the [place].  Has diastases recti. Does not have a hernia. This does not bother him.  Patient reports he is due for colonoscopy related to polyps. 3 year recall.  Thyroid nodule: Saw ENT and had a biopsy. Appears to have likely been benign and this is what he reports he was advised. He has an appointment with them in 12 days for recheck as he notes that nodule is getting a little bit bigger. Notes he does feel food catching and sticking at times in his throat when he swallows though he is always able to swallow. No breathing issues. No speech issues. No pain.  PMH: Smoker   ROS see history of present illness  Objective  Physical Exam Vitals:   11/11/16 0801  BP: 120/70  Pulse: 77  Temp: 98.3 F (36.8 C)  SpO2: 96%    BP Readings from Last 3 Encounters:  11/11/16 120/70  10/21/16 (!) 110/58  08/11/16 110/64   Wt Readings from Last 3 Encounters:  11/11/16 219 lb 6.4 oz (99.5 kg)  08/11/16 216 lb (98 kg)  08/11/16 216 lb 6.4 oz (98.2 kg)    Physical Exam  Constitutional: No distress.  Neck: Neck supple. No thyromegaly present.  Small left-sided thyroid nodule noted, nontender  Cardiovascular: Normal rate, regular rhythm and normal heart sounds.   Pulmonary/Chest: Effort  normal and breath sounds normal.  Abdominal: Soft. Bowel sounds are normal. He exhibits no distension. There is no tenderness.  Diastases recti noted  Musculoskeletal: He exhibits no edema.  Neurological: He is alert. Gait normal.  Skin: Skin is warm and dry. He is not diaphoretic.     Assessment/Plan: Please see individual problem list.  Essential hypertension Well-controlled. Continue current regimen.  Diabetes mellitus, type 2 ([place]) Relatively well controlled. He'll return for an A1c. Continue metformin.  Hereditary and idiopathic peripheral neuropathy Stable. Continue Lyrica.  Diastasis recti Asymptomatic. Monitor.  Strain of masseter muscle Resolved.  Chronic back pain Stable on tramadol. Refills through the [place].  Thyroid nodule Patient following with ENT. He will see them as scheduled. Discussed if he has worsening trouble with food sticking or is unable to swallow or has difficulty with breathing he needs to be evaluated sooner.  History of colon polyps Refer back to GI for colonoscopy.   Orders Placed This Encounter  Procedures  . Ambulatory referral to Gastroenterology    Referral Priority:   Routine    Referral Type:   Consultation    Referral Reason:   Specialty Services Required    Number of Visits Requested:   1  . POCT HgB A1C    Standing Status:   Future    Standing Expiration Date:   01/11/2017    Tommi Rumps, MD [place]

## 2016-11-11 NOTE — Patient Instructions (Signed)
Nice to see you. You need an A1c though it is not due until 11/27/16. We will schedule a lab appointment around that time.

## 2016-11-11 NOTE — Assessment & Plan Note (Signed)
Refer back to GI for colonoscopy. 

## 2016-11-23 ENCOUNTER — Other Ambulatory Visit: Payer: Self-pay | Admitting: Otolaryngology

## 2016-11-23 DIAGNOSIS — R1314 Dysphagia, pharyngoesophageal phase: Secondary | ICD-10-CM | POA: Diagnosis not present

## 2016-11-23 DIAGNOSIS — E041 Nontoxic single thyroid nodule: Secondary | ICD-10-CM

## 2016-11-25 ENCOUNTER — Other Ambulatory Visit: Payer: Self-pay | Admitting: Family Medicine

## 2016-11-26 ENCOUNTER — Ambulatory Visit: Payer: Medicare Other

## 2016-11-26 ENCOUNTER — Ambulatory Visit
Admission: RE | Admit: 2016-11-26 | Discharge: 2016-11-26 | Disposition: A | Discharge status: Home / Self Care | Payer: Medicare Other | Source: Ambulatory Visit | Attending: Otolaryngology | Admitting: Otolaryngology

## 2016-11-26 DIAGNOSIS — E042 Nontoxic multinodular goiter: Secondary | ICD-10-CM | POA: Insufficient documentation

## 2016-11-26 DIAGNOSIS — E041 Nontoxic single thyroid nodule: Secondary | ICD-10-CM | POA: Diagnosis not present

## 2016-11-27 ENCOUNTER — Other Ambulatory Visit: Payer: Medicare Other

## 2016-11-27 ENCOUNTER — Other Ambulatory Visit (INDEPENDENT_AMBULATORY_CARE_PROVIDER_SITE_OTHER): Payer: Medicare Other

## 2016-11-27 DIAGNOSIS — E119 Type 2 diabetes mellitus without complications: Secondary | ICD-10-CM | POA: Diagnosis not present

## 2016-11-27 LAB — POCT GLYCOSYLATED HEMOGLOBIN (HGB A1C): Hemoglobin A1C: 7.1

## 2016-11-27 MED ORDER — TRAMADOL HCL 50 MG PO TABS: 100.0000 mg | ORAL_TABLET | Freq: Three times a day (TID) | ORAL | 0 refills | Status: DC | PRN

## 2016-11-27 NOTE — Telephone Encounter (Signed)
Refill for 30 days only.   

## 2016-11-27 NOTE — Telephone Encounter (Signed)
faxed

## 2016-11-27 NOTE — Telephone Encounter (Signed)
Last OV 11/11/16 last filled 10/30/16 90 0rf Patient does not have any left.

## 2016-11-30 ENCOUNTER — Other Ambulatory Visit: Payer: Self-pay | Admitting: Otolaryngology

## 2016-11-30 DIAGNOSIS — E041 Nontoxic single thyroid nodule: Secondary | ICD-10-CM

## 2016-12-19 ENCOUNTER — Other Ambulatory Visit: Payer: Self-pay | Admitting: Internal Medicine

## 2016-12-25 MED ORDER — TRAMADOL HCL 50 MG PO TABS: 100.0000 mg | ORAL_TABLET | Freq: Three times a day (TID) | ORAL | 0 refills | Status: DC | PRN

## 2016-12-25 NOTE — Telephone Encounter (Signed)
Refilled: 11/27/2017 Last OV: 11/11/2016 Next OV: 03/12/2017

## 2017-01-18 ENCOUNTER — Other Ambulatory Visit: Payer: Self-pay | Admitting: Family Medicine

## 2017-01-19 MED ORDER — TRAMADOL HCL 50 MG PO TABS: 100.0000 mg | ORAL_TABLET | Freq: Three times a day (TID) | ORAL | 1 refills | Status: DC | PRN

## 2017-01-19 NOTE — Telephone Encounter (Signed)
Last OV 11/11/16 last filled 12/25/16 90 0rf

## 2017-01-20 NOTE — Telephone Encounter (Signed)
faxed

## 2017-02-05 ENCOUNTER — Other Ambulatory Visit: Payer: Self-pay | Admitting: Gastroenterology

## 2017-02-05 DIAGNOSIS — R1310 Dysphagia, unspecified: Secondary | ICD-10-CM | POA: Diagnosis not present

## 2017-02-05 DIAGNOSIS — Z86010 Personal history of colonic polyps: Secondary | ICD-10-CM | POA: Diagnosis not present

## 2017-02-10 ENCOUNTER — Ambulatory Visit
Admission: RE | Admit: 2017-02-10 | Discharge: 2017-02-10 | Disposition: A | Discharge status: Home / Self Care | Payer: Medicare Other | Source: Ambulatory Visit | Attending: Gastroenterology | Admitting: Gastroenterology

## 2017-02-10 DIAGNOSIS — R1310 Dysphagia, unspecified: Secondary | ICD-10-CM | POA: Insufficient documentation

## 2017-02-10 DIAGNOSIS — K219 Gastro-esophageal reflux disease without esophagitis: Secondary | ICD-10-CM | POA: Insufficient documentation

## 2017-02-22 ENCOUNTER — Encounter: Payer: Self-pay | Admitting: Student

## 2017-02-23 ENCOUNTER — Ambulatory Visit
Admission: RE | Admit: 2017-02-23 | Discharge: 2017-02-23 | Disposition: A | Discharge status: Home / Self Care | Payer: Medicare Other | Source: Ambulatory Visit | Attending: Gastroenterology | Admitting: Gastroenterology

## 2017-02-23 ENCOUNTER — Encounter
Admission: RE | Disposition: A | Discharge status: Home / Self Care | Payer: Self-pay | Source: Ambulatory Visit | Attending: Gastroenterology

## 2017-02-23 ENCOUNTER — Ambulatory Visit: Payer: Medicare Other | Admitting: Anesthesiology

## 2017-02-23 ENCOUNTER — Other Ambulatory Visit: Payer: Self-pay

## 2017-02-23 ENCOUNTER — Encounter: Payer: Self-pay | Admitting: *Deleted

## 2017-02-23 DIAGNOSIS — K449 Diaphragmatic hernia without obstruction or gangrene: Secondary | ICD-10-CM | POA: Insufficient documentation

## 2017-02-23 DIAGNOSIS — K210 Gastro-esophageal reflux disease with esophagitis: Secondary | ICD-10-CM | POA: Diagnosis not present

## 2017-02-23 DIAGNOSIS — K2950 Unspecified chronic gastritis without bleeding: Secondary | ICD-10-CM | POA: Insufficient documentation

## 2017-02-23 DIAGNOSIS — I10 Essential (primary) hypertension: Secondary | ICD-10-CM | POA: Insufficient documentation

## 2017-02-23 DIAGNOSIS — R1310 Dysphagia, unspecified: Secondary | ICD-10-CM | POA: Insufficient documentation

## 2017-02-23 DIAGNOSIS — K635 Polyp of colon: Secondary | ICD-10-CM | POA: Insufficient documentation

## 2017-02-23 DIAGNOSIS — E119 Type 2 diabetes mellitus without complications: Secondary | ICD-10-CM | POA: Insufficient documentation

## 2017-02-23 DIAGNOSIS — M1990 Unspecified osteoarthritis, unspecified site: Secondary | ICD-10-CM | POA: Diagnosis not present

## 2017-02-23 DIAGNOSIS — K3189 Other diseases of stomach and duodenum: Secondary | ICD-10-CM | POA: Diagnosis not present

## 2017-02-23 DIAGNOSIS — K2270 Barrett's esophagus without dysplasia: Secondary | ICD-10-CM | POA: Diagnosis not present

## 2017-02-23 DIAGNOSIS — Z7984 Long term (current) use of oral hypoglycemic drugs: Secondary | ICD-10-CM | POA: Insufficient documentation

## 2017-02-23 DIAGNOSIS — Z955 Presence of coronary angioplasty implant and graft: Secondary | ICD-10-CM | POA: Diagnosis not present

## 2017-02-23 DIAGNOSIS — Z7982 Long term (current) use of aspirin: Secondary | ICD-10-CM | POA: Insufficient documentation

## 2017-02-23 DIAGNOSIS — K5730 Diverticulosis of large intestine without perforation or abscess without bleeding: Secondary | ICD-10-CM | POA: Insufficient documentation

## 2017-02-23 DIAGNOSIS — E785 Hyperlipidemia, unspecified: Secondary | ICD-10-CM | POA: Insufficient documentation

## 2017-02-23 DIAGNOSIS — I2510 Atherosclerotic heart disease of native coronary artery without angina pectoris: Secondary | ICD-10-CM | POA: Insufficient documentation

## 2017-02-23 DIAGNOSIS — D122 Benign neoplasm of ascending colon: Secondary | ICD-10-CM | POA: Diagnosis not present

## 2017-02-23 DIAGNOSIS — Z1211 Encounter for screening for malignant neoplasm of colon: Secondary | ICD-10-CM | POA: Diagnosis not present

## 2017-02-23 DIAGNOSIS — F329 Major depressive disorder, single episode, unspecified: Secondary | ICD-10-CM | POA: Diagnosis not present

## 2017-02-23 DIAGNOSIS — Z79899 Other long term (current) drug therapy: Secondary | ICD-10-CM | POA: Insufficient documentation

## 2017-02-23 DIAGNOSIS — Z86010 Personal history of colonic polyps: Secondary | ICD-10-CM | POA: Diagnosis not present

## 2017-02-23 DIAGNOSIS — D124 Benign neoplasm of descending colon: Secondary | ICD-10-CM | POA: Insufficient documentation

## 2017-02-23 DIAGNOSIS — B9681 Helicobacter pylori [H. pylori] as the cause of diseases classified elsewhere: Secondary | ICD-10-CM | POA: Insufficient documentation

## 2017-02-23 DIAGNOSIS — K2970 Gastritis, unspecified, without bleeding: Secondary | ICD-10-CM | POA: Diagnosis not present

## 2017-02-23 HISTORY — PX: COLONOSCOPY WITH PROPOFOL: SHX5780

## 2017-02-23 HISTORY — DX: Other chronic pain: G89.29

## 2017-02-23 HISTORY — PX: ESOPHAGOGASTRODUODENOSCOPY (EGD) WITH PROPOFOL: SHX5813

## 2017-02-23 LAB — GLUCOSE, CAPILLARY: Glucose-Capillary: 126 mg/dL — ABNORMAL HIGH (ref 65–99)

## 2017-02-23 LAB — HM COLONOSCOPY

## 2017-02-23 SURGERY — ESOPHAGOGASTRODUODENOSCOPY (EGD) WITH PROPOFOL
Anesthesia: General

## 2017-02-23 MED ORDER — PROPOFOL 10 MG/ML IV BOLUS
INTRAVENOUS | Status: DC | PRN
  Administered 2017-02-23: 100 mg via INTRAVENOUS

## 2017-02-23 MED ORDER — IPRATROPIUM-ALBUTEROL 0.5-2.5 (3) MG/3ML IN SOLN
3.0000 mL | Freq: Once | RESPIRATORY_TRACT | Status: AC
  Administered 2017-02-23: 3 mL via RESPIRATORY_TRACT

## 2017-02-23 MED ORDER — SODIUM CHLORIDE 0.9 % IV SOLN
INTRAVENOUS | Status: DC
  Administered 2017-02-23: 12:00:00 via INTRAVENOUS

## 2017-02-23 MED ORDER — LIDOCAINE HCL (PF) 2 % IJ SOLN
INTRAMUSCULAR | Status: DC | PRN
  Administered 2017-02-23: 100 mg via INTRADERMAL

## 2017-02-23 MED ORDER — LIDOCAINE HCL (PF) 2 % IJ SOLN
INTRAMUSCULAR | Status: AC
  Filled 2017-02-23: qty 10

## 2017-02-23 MED ORDER — PROPOFOL 500 MG/50ML IV EMUL
INTRAVENOUS | Status: AC
Start: 2017-02-23 — End: ?
  Filled 2017-02-23: qty 50

## 2017-02-23 MED ORDER — IPRATROPIUM-ALBUTEROL 0.5-2.5 (3) MG/3ML IN SOLN
RESPIRATORY_TRACT | Status: AC
  Administered 2017-02-23: 3 mL via RESPIRATORY_TRACT
  Filled 2017-02-23: qty 3

## 2017-02-23 MED ORDER — PROPOFOL 500 MG/50ML IV EMUL
INTRAVENOUS | Status: DC | PRN
  Administered 2017-02-23: 150 ug/kg/min via INTRAVENOUS

## 2017-02-23 NOTE — Transfer of Care (Signed)
Immediate Anesthesia Transfer of Care Note  Patient: Walter Salas  Procedure(s) Performed: ESOPHAGOGASTRODUODENOSCOPY (EGD) WITH PROPOFOL (N/A ) COLONOSCOPY WITH PROPOFOL (N/A )  Patient Location: PACU  Anesthesia Type:General  Level of Consciousness: sedated  Airway & Oxygen Therapy: Patient Spontanous Breathing and Patient connected to nasal cannula oxygen  Post-op Assessment: Report given to RN and Post -op Vital signs reviewed and stable  Post vital signs: Reviewed and stable  Last Vitals:  Vitals:   02/23/17 1214 02/23/17 1359  BP: (!) 144/77 138/82  Pulse: 62 67  Resp: 20 19  Temp: (!) 36.2 C (!) 36.1 C  SpO2: 99% 98%    Last Pain:  Vitals:   02/23/17 1359  TempSrc: Tympanic  PainSc:       Patients Stated Pain Goal: 8 (97/28/20 6015)  Complications: No apparent anesthesia complications

## 2017-02-23 NOTE — Op Note (Signed)
[facility] Gastroenterology Patient Name: Walter Salas Procedure Date: 02/23/2017 12:27 PM MRN: [id number] Account #: [id number] Date of Birth: 23-Sep-1955 Admit Type: Outpatient Age: 62 Room: [facility] ENDO ROOM 3 Gender: Male Note Status: Finalized Procedure:            Colonoscopy Indications:          Personal history of colonic polyps Providers:            Lollie Sails, MD Referring MD:         Angela Adam. Caryl Bis (Referring MD) Medicines:            Monitored Anesthesia Care Complications:        No immediate complications. Procedure:            Pre-Anesthesia Assessment:                       - ASA Grade Assessment: III - A patient with severe                        systemic disease.                       After obtaining informed consent, the colonoscope was                        passed under direct vision. Throughout the procedure,                        the patient's blood pressure, pulse, and oxygen                        saturations were monitored continuously. The Olympus                        PCF-H180AL colonoscope ( S#: [id number] ) was introduced                        through the anus and advanced to the the cecum,                        identified by appendiceal orifice and ileocecal valve.                        The colonoscopy was unusually difficult due to poor                        bowel prep. Successful completion of the procedure was                        aided by changing the patient to a prone position and                        lavage. The patient tolerated the procedure well. The                        quality of the bowel preparation was good except the                        ascending colon was poor. Findings:      A 5 mm polyp  was found in the descending colon. The polyp was sessile.       The polyp was removed with a cold snare. Resection and retrieval were       complete.      A 8 mm polyp was found in the proximal ascending  colon. The polyp was       sessile. The polyp was removed with a cold snare. Resection and       retrieval were complete.      A 2 mm polyp was found in the descending colon. The polyp was sessile.       The polyp was removed with a cold biopsy forceps. Resection and       retrieval were complete.      A 3 mm polyp was found in the distal sigmoid colon. The polyp was       sessile. The polyp was removed with a cold snare. Resection and       retrieval were complete.      A few small-mouthed diverticula were found in the sigmoid colon and       descending colon.      The retroflexed view of the distal rectum and anal verge was normal and       showed no anal or rectal abnormalities.      The digital rectal exam was normal. Impression:           - One 5 mm polyp in the descending colon, removed with                        a cold snare. Resected and retrieved.                       - One 8 mm polyp in the proximal ascending colon,                        removed with a cold snare. Resected and retrieved.                       - One 2 mm polyp in the descending colon, removed with                        a cold biopsy forceps. Resected and retrieved.                       - One 3 mm polyp in the distal sigmoid colon, removed                        with a cold snare. Resected and retrieved.                       - Diverticulosis in the sigmoid colon and in the                        descending colon.                       - The distal rectum and anal verge are normal on                        retroflexion view. Recommendation:       - Discharge patient to  home.                       - Continue present medications. Procedure Code(s):    --- Professional ---                       [phone], Colonoscopy, flexible; with removal of tumor(s),                        polyp(s), or other lesion(s) by snare technique                       [id number], 36, Colonoscopy, flexible; with biopsy, single                         or multiple Diagnosis Code(s):    --- Professional ---                       D12.2, Benign neoplasm of ascending colon                       D12.4, Benign neoplasm of descending colon                       D12.5, Benign neoplasm of sigmoid colon                       Z86.010, Personal history of colonic polyps                       K57.30, Diverticulosis of large intestine without                        perforation or abscess without bleeding CPT copyright 2016 American Medical Association. All rights reserved. The codes documented in this report are preliminary and upon coder review may  be revised to meet current compliance requirements. Lollie Sails, MD 02/23/2017 1:59:05 PM This report has been signed electronically. Number of Addenda: 0 Note Initiated On: 02/23/2017 12:27 PM Scope Withdrawal Time: 0 hours 23 minutes 23 seconds  Total Procedure Duration: 0 hours 40 minutes 47 seconds       [facility]

## 2017-02-23 NOTE — Op Note (Signed)
[facility] Gastroenterology Patient Name: Walter Salas Procedure Date: 02/23/2017 12:28 PM MRN: [id number] Account #: [id number] Date of Birth: 13-Nov-1955 Admit Type: Outpatient Age: 62 Room: [facility] ENDO ROOM 3 Gender: Male Note Status: Finalized Procedure:            Upper GI endoscopy Indications:          Dysphagia Providers:            Lollie Sails, MD Referring MD:         Angela Adam. Caryl Bis (Referring MD) Medicines:            Monitored Anesthesia Care Complications:        No immediate complications. Procedure:            Pre-Anesthesia Assessment:                       - ASA Grade Assessment: III - A patient with severe                        systemic disease.                       After obtaining informed consent, the endoscope was                        passed under direct vision. Throughout the procedure,                        the patient's blood pressure, pulse, and oxygen                        saturations were monitored continuously. The Endoscope                        was introduced through the mouth, and advanced to the                        third part of duodenum. The upper GI endoscopy was                        accomplished without difficulty. The patient tolerated                        the procedure well. The patient tolerated the procedure                        well. Findings:      There were esophageal mucosal changes consistent with short-segment       Barrett's esophagus present in the lower third of the esophagus. The       maximum longitudinal extent of these mucosal changes was 3 cm in length.       Mucosa was biopsied with a cold forceps for histology in 4 quadrants at       intervals of 2 cm at 43 and 44, 46 cm from the incisors.      Diffuse mild inflammation characterized by congestion (edema) and       erythema was found in the gastric body and in the gastric antrum.       Biopsies were taken with a cold forceps for  histology.      A single localized,  1 mm non-bleeding erosion was found at the pylorus.       There were no stigmata of recent bleeding.      The examined duodenum was normal.      A small hiatal hernia was present.      The cardia and gastric fundus were normal on retroflexion otherwise.      Close inspection was undertaken trhoughout the entire esophagus with       special attention to the region of the upper third where a anomalous       area was seen on review of barium swallow. The mucosal surfaces appeared       to be normal however there seemed to be a small extrinsic compression       slightly below the UES, without other abnormal appearance. There are       normal appearing inlet patches noted. Impression:           - Esophageal mucosal changes consistent with                        short-segment Barrett's esophagus. Biopsied.                       - Gastritis. Biopsied.                       - Non-bleeding erosive gastropathy.                       - Normal examined duodenum.                       - Small hiatal hernia. Recommendation:       - Await pathology results.                       - Use Protonix (pantoprazole) 40 mg PO daily daily. Procedure Code(s):    --- Professional ---                       [phone], Esophagogastroduodenoscopy, flexible, transoral;                        with biopsy, single or multiple Diagnosis Code(s):    --- Professional ---                       K22.8, Other specified diseases of esophagus                       K29.70, Gastritis, unspecified, without bleeding                       K31.89, Other diseases of stomach and duodenum                       K44.9, Diaphragmatic hernia without obstruction or                        gangrene                       R13.10, Dysphagia, unspecified CPT copyright 2016 American Medical Association. All rights reserved. The codes documented in this report are preliminary and upon coder review may  be revised to  meet current compliance requirements. Lollie Sails, MD 02/23/2017 1:09:47 PM This report has been signed electronically. Number of Addenda: 0 Note Initiated On: 02/23/2017 12:28 PM      [facility]

## 2017-02-23 NOTE — Anesthesia Postprocedure Evaluation (Signed)
Anesthesia Post Note  Patient: Sherley A Marsala  Procedure(s) Performed: ESOPHAGOGASTRODUODENOSCOPY (EGD) WITH PROPOFOL (N/A ) COLONOSCOPY WITH PROPOFOL (N/A )  Patient location during evaluation: Endoscopy Anesthesia Type: General Level of consciousness: awake and alert Pain management: pain level controlled Vital Signs Assessment: post-procedure vital signs reviewed and stable Respiratory status: spontaneous breathing, nonlabored ventilation, respiratory function stable and patient connected to nasal cannula oxygen Cardiovascular status: blood pressure returned to baseline and stable Postop Assessment: no apparent nausea or vomiting Anesthetic complications: no     Last Vitals:  Vitals:   02/23/17 1214 02/23/17 1359  BP: (!) 144/77 138/82  Pulse: 62 67  Resp: 20 19  Temp: (!) 36.2 C (!) 36.1 C  SpO2: 99% 98%    Last Pain:  Vitals:   02/23/17 1359  TempSrc: Tympanic  PainSc:                  Precious Haws Jaselynn Tamas

## 2017-02-23 NOTE — Anesthesia Post-op Follow-up Note (Signed)
Anesthesia QCDR form completed.        

## 2017-02-23 NOTE — Anesthesia Procedure Notes (Signed)
Date/Time: 02/23/2017 12:43 PM Performed by: Nelda Marseille, CRNA Pre-anesthesia Checklist: Patient identified, Emergency Drugs available, Suction available, Patient being monitored and Timeout performed Oxygen Delivery Method: Nasal cannula

## 2017-02-23 NOTE — Anesthesia Preprocedure Evaluation (Signed)
Anesthesia Evaluation  Patient identified by MRN, date of birth, ID band Patient awake    Reviewed: Allergy & Precautions, H&P , NPO status , Patient's Chart, lab work & pertinent test results  History of Anesthesia Complications Negative for: history of anesthetic complications  Airway Mallampati: III  TM Distance: <3 FB Neck ROM: limited    Dental  (+) Chipped, Poor Dentition, Missing   Pulmonary neg shortness of breath, COPD, Current Smoker,           Cardiovascular Exercise Tolerance: Good hypertension, (-) angina+ CAD and + Peripheral Vascular Disease  (-) DOE      Neuro/Psych PSYCHIATRIC DISORDERS Depression  Neuromuscular disease    GI/Hepatic negative GI ROS, Neg liver ROS, neg GERD  ,  Endo/Other  diabetes, Type 2, Oral Hypoglycemic Agents  Renal/GU Renal disease  negative genitourinary   Musculoskeletal   Abdominal   Peds  Hematology negative hematology ROS (+)   Anesthesia Other Findings Past Medical History: No date: Arthritis No date: CAD (coronary artery disease) No date: CAD (coronary artery disease) No date: Chronic pain No date: Colon polyp No date: Depression No date: Diabetes mellitus without complication ([place]) 5284: Hx of laminectomy No date: Hyperlipidemia No date: Hypertension No date: Kidney stone No date: Ulcer  Past Surgical History: 1993, 1994, 1997, 1998: BACK SURGERY 1990: CARPAL TUNNEL RELEASE; Bilateral 2000. 2003: CERVICAL FUSION 2000: CHOLECYSTECTOMY 2014: CORONARY ANGIOPLASTY WITH STENT PLACEMENT     Comment[facility]  [facility] [place] 2012: SHOULDER ARTHROSCOPY; Right 2007: TUMOR EXCISION  BMI    Body Mass Index:  30.71 kg/m      Reproductive/Obstetrics negative OB ROS                             Anesthesia Physical Anesthesia Plan  ASA: III  Anesthesia Plan: General   Post-op Pain Management:    Induction:  Intravenous  PONV Risk Score and Plan: Propofol infusion and TIVA  Airway Management Planned: Natural Airway and Nasal Cannula  Additional Equipment:   Intra-op Plan:   Post-operative Plan:   Informed Consent: I have reviewed the patients History and Physical, chart, labs and discussed the procedure including the risks, benefits and alternatives for the proposed anesthesia with the patient or authorized representative who has indicated his/her understanding and acceptance.   Dental Advisory Given  Plan Discussed with: Anesthesiologist, CRNA and Surgeon  Anesthesia Plan Comments: (Patient consented for risks of anesthesia including but not limited to:  - adverse reactions to medications - risk of intubation if required - damage to teeth, lips or other oral mucosa - sore throat or hoarseness - Damage to heart, brain, lungs or loss of life  Patient voiced understanding.)        Anesthesia Quick Evaluation

## 2017-02-24 ENCOUNTER — Other Ambulatory Visit: Payer: Self-pay | Admitting: Gastroenterology

## 2017-02-24 ENCOUNTER — Encounter: Payer: Self-pay | Admitting: Gastroenterology

## 2017-02-24 DIAGNOSIS — R933 Abnormal findings on diagnostic imaging of other parts of digestive tract: Secondary | ICD-10-CM

## 2017-02-26 LAB — SURGICAL PATHOLOGY

## 2017-02-27 ENCOUNTER — Telehealth: Payer: Self-pay | Admitting: Family Medicine

## 2017-02-27 NOTE — Telephone Encounter (Signed)
Please contact the patient and see if he has received his EGD results from GI.  If he has not he needs to contact them.  It appears he has H. pylori which is a bacteria that can contribute to stomach irritation and ulcers and he will need treatment from them.  Please print his pathology results and fax it to [facility] as well.  Thanks.

## 2017-03-01 ENCOUNTER — Telehealth: Payer: Self-pay

## 2017-03-01 NOTE — Telephone Encounter (Signed)
fyi

## 2017-03-01 NOTE — Telephone Encounter (Signed)
Copied from [place]. Topic: Quick Communication - Office Called Patient >> Mar 01, 2017  1:30 PM Juanda Chance, CMA wrote: Left message to return call, ok for pec to speak to patient and ask if he has heard from [facility] gastroenterology about his EGD results. If not please let patient know to contact them and document  >> Mar 01, 2017  2:10 PM Carolyn Stare wrote:  Pt wife said the results of the polyps will not been known for at least 2 weeks.     >> Mar 01, 2017  2:13 PM Carolyn Stare wrote:  Wife said he is having a MRI on his neck there is something inside that there are looking at that was discovered doing his endoscopy    MRI will be 03/03/17

## 2017-03-01 NOTE — Telephone Encounter (Signed)
Noted.  It looks like he is having a CT scan of his neck.  Thanks.

## 2017-03-01 NOTE — Telephone Encounter (Addendum)
Wife states they have not gotten any info yet on the results. Advised wife to please contact [facility] office for results. Wife verbalized understanding.

## 2017-03-01 NOTE — Telephone Encounter (Signed)
Left message to return call, ok for pec to speak to patient and ask if he has heard from [facility] gastroenterology about his EGD results. If not please let patient know to contact them and document

## 2017-03-01 NOTE — Telephone Encounter (Signed)
Please follow-up with the patient's wife to make sure they got in touch with GI.  Also, did you print the pathology results and fax them to [facility]?  Thanks.

## 2017-03-02 ENCOUNTER — Encounter: Payer: Self-pay | Admitting: Family Medicine

## 2017-03-02 NOTE — Telephone Encounter (Signed)
faxed

## 2017-03-03 ENCOUNTER — Ambulatory Visit
Admission: RE | Admit: 2017-03-03 | Discharge: 2017-03-03 | Disposition: A | Discharge status: Home / Self Care | Payer: Medicare Other | Source: Ambulatory Visit | Attending: Gastroenterology | Admitting: Gastroenterology

## 2017-03-03 DIAGNOSIS — R933 Abnormal findings on diagnostic imaging of other parts of digestive tract: Secondary | ICD-10-CM | POA: Insufficient documentation

## 2017-03-03 DIAGNOSIS — R1310 Dysphagia, unspecified: Secondary | ICD-10-CM | POA: Diagnosis not present

## 2017-03-03 HISTORY — DX: Heart failure, unspecified: I50.9

## 2017-03-03 LAB — POCT I-STAT CREATININE: Creatinine, Ser: 0.9 mg/dL (ref 0.61–1.24)

## 2017-03-03 MED ORDER — IOPAMIDOL (ISOVUE-300) INJECTION 61%
75.0000 mL | Freq: Once | INTRAVENOUS | Status: AC | PRN
  Administered 2017-03-03: 75 mL via INTRAVENOUS

## 2017-03-12 ENCOUNTER — Encounter: Payer: Self-pay | Admitting: Physician Assistant

## 2017-03-12 ENCOUNTER — Ambulatory Visit: Payer: Medicare Other | Admitting: Physician Assistant

## 2017-03-12 ENCOUNTER — Ambulatory Visit: Payer: Medicare Other | Admitting: Family Medicine

## 2017-03-12 VITALS — BP 128/60 | HR 80 | Ht 70.0 in | Wt 222.5 lb

## 2017-03-12 DIAGNOSIS — I77810 Thoracic aortic ectasia: Secondary | ICD-10-CM

## 2017-03-12 DIAGNOSIS — R55 Syncope and collapse: Secondary | ICD-10-CM | POA: Diagnosis not present

## 2017-03-12 DIAGNOSIS — R1032 Left lower quadrant pain: Secondary | ICD-10-CM | POA: Diagnosis not present

## 2017-03-12 DIAGNOSIS — I10 Essential (primary) hypertension: Secondary | ICD-10-CM

## 2017-03-12 DIAGNOSIS — I2510 Atherosclerotic heart disease of native coronary artery without angina pectoris: Secondary | ICD-10-CM

## 2017-03-12 DIAGNOSIS — R197 Diarrhea, unspecified: Secondary | ICD-10-CM | POA: Diagnosis not present

## 2017-03-12 DIAGNOSIS — E785 Hyperlipidemia, unspecified: Secondary | ICD-10-CM

## 2017-03-12 DIAGNOSIS — Z8719 Personal history of other diseases of the digestive system: Secondary | ICD-10-CM | POA: Diagnosis not present

## 2017-03-12 DIAGNOSIS — K5792 Diverticulitis of intestine, part unspecified, without perforation or abscess without bleeding: Secondary | ICD-10-CM | POA: Diagnosis not present

## 2017-03-12 NOTE — Progress Notes (Signed)
Cardiology Office Note Date:  03/12/2017  Patient ID:  Walter Salas, Walter Salas 04-16-1955, MRN [id number] PCP:  Leone Haven, MD  Cardiologist:  Dr. Saunders Revel, MD    Chief Complaint: Follow up  History of Present Illness: Walter Salas is a 62 y.o. male with history of CAD s/p PCI/DES to the LCx and RCA in 2014 in a staged procedure at [facility], DM2, HTN, HLD, depression, renal stones, and prior fall with [facility] who presents for follow up of syncope.   Patient initially established with Dr. Saunders Revel in 04/2016 after suffering several syncopal episodes in the preceding month. He underwent TTE that showed EF 60-65%, normal wall motion, Gr1DD, mild aortic valve calcification, mildly dilated aortic root measuring up to 4.2 cm, normal RV cavity size and systolic function, normal PASP, 30-day event monitor that showed sinus rhythm with frist degree AV block, no significant arrhythmias or pauses, and Myoview that showed a small in size, mild in severity fixed basal and anteriolateral defect, no ischemia, LVEF 55-65%, low risk study. No clear cause for his syncope was identified. Since these syncopal episodes, his metoprolol and glipizide have been discontinud and his trazodone has been tapered. He was last seen in 06/2016 and denied any further syncopal episodes. He had noticed a vague chest discomfort that was not responsive to SL NTG and felt different than the pain experienced with his PCIs in 2014. He attributed this pain to gas. Blood pressure at home has been running in the low 314H systolic after tapering off some of his medications. He underwent CTA of the chest in 06/2016 to further characterize his dilated aortic root that showed an ascending thoracic aortic aneurysmal dilatation with a maximum diameter of 4.3 cm. There was an incidental 14 mm left thyroid nodule noted. FNA of this nodule in 07/261 showed a follicular lesion of undetermined significance. He has since undergone EGD for dysphagia that showed H  pylori with moderate inflammation and no evidence of high grade malignancy. This was followed by CT of the esophagus that was negative for large mass.  Labs: 11/2016: A1c 7.1; 06/2016 LDL 78, TG 242, LFT normal.  Comes in doing well today.  No further syncopal episodes.  He has since resumed driving as it has been greater than 6 months since his last syncopal episode in 04/2016.  No further chest pain.  No dizziness or presyncope.  No palpitations.  No shortness of breath.  Blood pressure at home has been running in the 1 teens to 785Y systolic.  Heart rate has been in the 70s-80s beats per minute.  Weight stable at 222 pounds.  Tolerating all medications without issues.  He continues to slowly taper his trazodone, currently taking 2 pills nightly.  He is working on eating a healthier diet.  He has stable 2-3 pillow orthopnea.  He denies any early satiety.  He does not have any concerns at this time.   Past Medical History:  Diagnosis Date  . Arthritis   . CAD (coronary artery disease)   . Chronic pain   . Colon polyp   . Depression   . Diabetes mellitus without complication ([place])   . Hx of laminectomy 1999  . Hyperlipidemia   . Hypertension   . Kidney stone   . Ulcer     Past Surgical History:  Procedure Laterality Date  . [place]  . CARPAL TUNNEL RELEASE Bilateral 1990  . CERVICAL FUSION  2000. 2003  .  CHOLECYSTECTOMY  2000  . COLONOSCOPY WITH PROPOFOL N/A 02/23/2017   Procedure: COLONOSCOPY WITH PROPOFOL;  Surgeon: Lollie Sails, MD;  Location: [facility] ENDOSCOPY;  Service: Endoscopy;  Laterality: N/A;  . CORONARY ANGIOPLASTY WITH STENT PLACEMENT  2014   [facility] [employer]  . ESOPHAGOGASTRODUODENOSCOPY (EGD) WITH PROPOFOL N/A 02/23/2017   Procedure: ESOPHAGOGASTRODUODENOSCOPY (EGD) WITH PROPOFOL;  Surgeon: Lollie Sails, MD;  Location: [facility] ENDOSCOPY;  Service: Endoscopy;  Laterality: N/A;  . SHOULDER ARTHROSCOPY Right  2012  . TUMOR EXCISION  2007    Current Meds  Medication Sig  . aspirin 81 MG tablet Take 81 mg by mouth daily.  Marland Kitchen atorvastatin (LIPITOR) 80 MG tablet Take 1 tablet (80 mg total) by mouth daily.  . bisacodyl (DULCOLAX) 10 MG suppository Place 10 mg rectally as needed for moderate constipation.  . chlorthalidone (HYGROTON) 50 MG tablet Take 25 mg by mouth daily.  . cholecalciferol (VITAMIN D) 1000 UNITS tablet Take 1 tablet (1,000 Units total) by mouth QID.  . DULoxetine (CYMBALTA) 20 MG capsule Take 20 mg by mouth daily.  . fenofibrate (TRICOR) 145 MG tablet Take 1 tablet (145 mg total) by mouth daily.  . hydroxypropyl methylcellulose (ISOPTO TEARS) 2.5 % ophthalmic solution Place 1 drop into both eyes as needed for dry eyes.  Marland Kitchen lidocaine (LIDODERM) 5 % Place 1 patch onto the skin daily. Remove & Discard patch within 12 hours or as directed by MD  . losartan (COZAAR) 25 MG tablet Take 25 mg by mouth daily.  . metFORMIN (GLUCOPHAGE) 1000 MG tablet Take 1,000 mg by mouth 2 (two) times daily with a meal.  . methocarbamol (ROBAXIN) 500 MG tablet Take 500 mg by mouth as directed.  . methyl salicylate liquid Apply 1 application topically as needed for muscle pain.  . metoprolol succinate (TOPROL-XL) 50 MG 24 hr tablet Take 50 mg by mouth daily. Take with or immediately following a meal.  . nitroGLYCERIN (NITROSTAT) 0.4 MG SL tablet Place 1 tablet (0.4 mg total) under the tongue every 5 (five) minutes as needed for chest pain. May take up to 3 doses.  Marland Kitchen omeprazole (PRILOSEC) 20 MG capsule Take 20 mg by mouth daily.  . polyvinyl alcohol (LIQUIFILM TEARS) 1.4 % ophthalmic solution 1 drop as needed for dry eyes.  . potassium chloride SA (K-DUR,KLOR-CON) 20 MEQ tablet Take 1 tablet (20 mEq total) by mouth daily.  . pregabalin (LYRICA) 50 MG capsule Take 50 mg by mouth 3 (three) times daily.  . promethazine (PHENERGAN) 12.5 MG tablet Take 12.5 mg by mouth every 6 (six) hours as needed for nausea or  vomiting.  . traMADol (ULTRAM) 50 MG tablet Take 2 tablets (100 mg total) by mouth every 8 (eight) hours as needed for moderate pain.  . traZODone (DESYREL) 100 MG tablet Take 3 tablets (300 mg total) by mouth at bedtime as needed for sleep.  . vitamin B-12 (CYANOCOBALAMIN) 1000 MCG tablet Take 1 tablet (1,000 mcg total) by mouth daily.    Allergies:   Gabapentin   Social History:  The patient  reports that he has been smoking cigarettes.  He has a 50.00 pack-year smoking history. he has never used smokeless tobacco. He reports that he does not drink alcohol or use drugs.   Family History:  The patient's family history includes Diabetes in his brother, father, mother, and sister; Heart disease in his father, paternal grandfather, and sister.  ROS:   Review of Systems  Constitutional: Negative  for chills, diaphoresis, fever, malaise/fatigue and weight loss.  HENT: Negative for congestion.   Eyes: Negative for discharge and redness.  Respiratory: Negative for cough, hemoptysis, sputum production, shortness of breath and wheezing.   Cardiovascular: Negative for chest pain, palpitations, orthopnea, claudication, leg swelling and PND.  Gastrointestinal: Positive for diarrhea and heartburn. Negative for abdominal pain, blood in stool, constipation, melena, nausea and vomiting.  Genitourinary: Negative for hematuria.  Musculoskeletal: Negative for falls and myalgias.  Skin: Negative for rash.  Neurological: Negative for dizziness, tingling, tremors, sensory change, speech change, focal weakness, loss of consciousness and weakness.  Endo/Heme/Allergies: Does not bruise/bleed easily.  Psychiatric/Behavioral: Negative for substance abuse. The patient is not nervous/anxious.   All other systems reviewed and are negative.    PHYSICAL EXAM:  VS:  BP 128/60 (BP Location: Left Arm, Patient Position: Sitting, Cuff Size: Normal)   Pulse 80   Ht 5\' 10"  (1.778 m)   Wt 222 lb 8 oz (100.9 kg)   BMI  31.93 kg/m  BMI: Body mass index is 31.93 kg/m.  Physical Exam  Constitutional: He is oriented to person, place, and time. He appears well-developed and well-nourished.  HENT:  Head: Normocephalic and atraumatic.  Eyes: Right eye exhibits no discharge. Left eye exhibits no discharge.  Neck: Normal range of motion. No JVD present.  Cardiovascular: Normal rate, regular rhythm, S1 normal, S2 normal and normal heart sounds. Exam reveals no distant heart sounds, no friction rub, no midsystolic click and no opening snap.  No murmur heard. Pulses:      Posterior tibial pulses are 2+ on the right side, and 2+ on the left side.  Pulmonary/Chest: Effort normal and breath sounds normal. No respiratory distress. He has no decreased breath sounds. He has no wheezes. He has no rales. He exhibits no tenderness.  Abdominal: Soft. He exhibits no distension. There is no tenderness.  Musculoskeletal: He exhibits no edema.  Neurological: He is alert and oriented to person, place, and time.  Skin: Skin is warm and dry. No cyanosis. Nails show no clubbing.  Psychiatric: He has a normal mood and affect. His speech is normal and behavior is normal. Judgment and thought content normal.     EKG:  Was ordered and interpreted by me today. Shows NSR, 80 bpm, low voltage QRS, no acute st/t changes  Recent Labs: 04/20/2016: Hemoglobin 14.3; Platelets 193.0; TSH 0.86 08/27/2016: ALT 17 09/04/2016: BUN 14; Potassium 3.9; Sodium 136 03/03/2017: Creatinine, Ser 0.90  08/27/2016: Cholesterol 130; Direct LDL 78.0; HDL 30.30; Total CHOL/HDL Ratio 4; Triglycerides 242.0; VLDL 48.4   Estimated Creatinine Clearance: 100.1 mL/min (by C-G formula based on SCr of 0.9 mg/dL).   Wt Readings from Last 3 Encounters:  03/12/17 222 lb 8 oz (100.9 kg)  02/23/17 214 lb (97.1 kg)  11/11/16 219 lb 6.4 oz (99.5 kg)     Other studies reviewed: Additional studies/records reviewed today include: summarized above  ASSESSMENT AND  PLAN:  1. Syncope: No further syncopal events since 04/2016.  Cardiac workup thus far including echocardiogram, Myoview, and 30-day event monitor have been unrevealing.  I did offer the patient carotid ultrasound today which he declined.  Since discontinuing metoprolol and glipizide and in the setting of tapering his trazodone he has not had any further syncopal episodes.  He is now driving short distances as he has not had a syncopal episode in 11 months.  Should he have recurrence of syncope would recommend he follow through with carotid ultrasound and would refer  to EP.  2. CAD of native coronary artery without angina: No further atypical chest pain.  Recent Myoview in 05/2016 without ischemia.  Continue aggressive risk factor modification and secondary prevention with aspirin, Lipitor, fenofibrate, losartan, and sublingual nitroglycerin as needed.  No longer on a beta-blocker at this time given his relative hypotension and history of syncope.  No plans for further ischemic evaluation at this time.  3. Dilated aortic root: Asymptomatic.  Stable on CTA of the aorta and 06/2016.  Radiology recommendation for repeat imaging in 06/2017.  Order for this has been placed.  Patient will follow-up thereafter.  4. HTN: Blood pressure is well controlled today.  Continue losartan 25 mg daily.  5. HLD: LDL from 06/2016 of 78 and TG of 242. Continue Lipitor and fenofibrate.  Continue to work on heart healthy diet.  Plan to recheck fasting lipid and liver function in 06/2017.  If LDL remains above goal at that time consider changing Lipitor to Crestor.  Goal LDL less than 70.  Disposition: F/u with Dr. Saunders Revel in 4 months.  Current medicines are reviewed at length with the patient today.  The patient did not have any concerns regarding medicines.  Signed, Christell Faith, PA-C 03/12/2017 8:33 AM     [place] [address] [place] [address] [place], [place] [address] [phone]

## 2017-03-12 NOTE — Patient Instructions (Signed)
Medication Instructions:  Your physician recommends that you continue on your current medications as directed. Please refer to the Current Medication list given to you today.   Labwork: Fasting lipid and liver profile in June when you have CT aorta. Nothing to eat or drink after midnight the evening before your labs. [place] lab.  Testing/Procedures: CT aorta in June  Follow-Up: Your physician wants you to follow-up in: July with Dr. Saunders Revel.  You will receive a reminder letter in the mail two months in advance. If you don't receive a letter, please call our office to schedule the follow-up appointment.    Any Other Special Instructions Will Be Listed Below (If Applicable).     If you need a refill on your cardiac medications before your next appointment, please call your pharmacy.

## 2017-03-16 DIAGNOSIS — D369 Benign neoplasm, unspecified site: Secondary | ICD-10-CM | POA: Diagnosis not present

## 2017-03-16 DIAGNOSIS — K5792 Diverticulitis of intestine, part unspecified, without perforation or abscess without bleeding: Secondary | ICD-10-CM | POA: Diagnosis not present

## 2017-03-16 DIAGNOSIS — K2270 Barrett's esophagus without dysplasia: Secondary | ICD-10-CM | POA: Diagnosis not present

## 2017-03-16 DIAGNOSIS — A048 Other specified bacterial intestinal infections: Secondary | ICD-10-CM | POA: Diagnosis not present

## 2017-03-26 ENCOUNTER — Other Ambulatory Visit: Payer: Self-pay

## 2017-03-26 ENCOUNTER — Encounter: Payer: Self-pay | Admitting: Family Medicine

## 2017-03-26 ENCOUNTER — Ambulatory Visit (INDEPENDENT_AMBULATORY_CARE_PROVIDER_SITE_OTHER): Payer: Medicare Other | Admitting: Family Medicine

## 2017-03-26 VITALS — BP 114/62 | HR 80 | Temp 98.1°F | Wt 223.4 lb

## 2017-03-26 DIAGNOSIS — G609 Hereditary and idiopathic neuropathy, unspecified: Secondary | ICD-10-CM

## 2017-03-26 DIAGNOSIS — M545 Low back pain: Secondary | ICD-10-CM | POA: Diagnosis not present

## 2017-03-26 DIAGNOSIS — E119 Type 2 diabetes mellitus without complications: Secondary | ICD-10-CM | POA: Diagnosis not present

## 2017-03-26 DIAGNOSIS — E782 Mixed hyperlipidemia: Secondary | ICD-10-CM | POA: Diagnosis not present

## 2017-03-26 DIAGNOSIS — Z125 Encounter for screening for malignant neoplasm of prostate: Secondary | ICD-10-CM | POA: Diagnosis not present

## 2017-03-26 DIAGNOSIS — Z1159 Encounter for screening for other viral diseases: Secondary | ICD-10-CM

## 2017-03-26 DIAGNOSIS — Z0001 Encounter for general adult medical examination with abnormal findings: Secondary | ICD-10-CM | POA: Insufficient documentation

## 2017-03-26 DIAGNOSIS — E538 Deficiency of other specified B group vitamins: Secondary | ICD-10-CM | POA: Insufficient documentation

## 2017-03-26 DIAGNOSIS — I2510 Atherosclerotic heart disease of native coronary artery without angina pectoris: Secondary | ICD-10-CM

## 2017-03-26 DIAGNOSIS — G8929 Other chronic pain: Secondary | ICD-10-CM

## 2017-03-26 LAB — VITAMIN B12: VITAMIN B 12: 517 pg/mL (ref 211–911)

## 2017-03-26 LAB — COMPREHENSIVE METABOLIC PANEL
ALBUMIN: 4.4 g/dL (ref 3.5–5.2)
ALK PHOS: 48 U/L (ref 39–117)
ALT: 31 U/L (ref 0–53)
AST: 14 U/L (ref 0–37)
BUN: 16 mg/dL (ref 6–23)
CALCIUM: 9.5 mg/dL (ref 8.4–10.5)
CHLORIDE: 100 meq/L (ref 96–112)
CO2: 31 mEq/L (ref 19–32)
CREATININE: 0.71 mg/dL (ref 0.40–1.50)
GFR: 118.95 mL/min (ref >60.00)
Glucose, Bld: 144 mg/dL — ABNORMAL HIGH (ref 70–99)
POTASSIUM: 4.3 meq/L (ref 3.5–5.1)
Sodium: 136 mEq/L (ref 135–145)
TOTAL PROTEIN: 6.3 g/dL (ref 6.0–8.3)
Total Bilirubin: 0.6 mg/dL (ref 0.2–1.2)

## 2017-03-26 LAB — PSA: PSA: 0.34 ng/mL (ref 0.10–4.00)

## 2017-03-26 LAB — HEMOGLOBIN A1C: Hgb A1c MFr Bld: 6.9 % — ABNORMAL HIGH (ref 4.6–6.5)

## 2017-03-26 MED ORDER — TRAMADOL HCL 50 MG PO TABS: 100.0000 mg | ORAL_TABLET | Freq: Three times a day (TID) | ORAL | 1 refills | Status: DC | PRN

## 2017-03-26 MED ORDER — NITROGLYCERIN 0.4 MG SL SUBL: 0.4000 mg | SUBLINGUAL_TABLET | SUBLINGUAL | 0 refills | Status: DC | PRN

## 2017-03-26 NOTE — Assessment & Plan Note (Signed)
Not due for lipid panel.  He will have this done through cardiology in June.  Encouraged follow-up with them at that time.

## 2017-03-26 NOTE — Assessment & Plan Note (Signed)
Check PSA. ?

## 2017-03-26 NOTE — Patient Instructions (Signed)
Nice to see you. Please continue on the omeprazole.  It appears this is what GI wanted you to do.  You can contact them as well to discuss. If you have persistent chest pain or shortness of breath that does not resolve with nitroglycerin please be evaluated immediately. We will check lab work today and contact you with the results. Please check with your insurance regarding the shingles vaccine.

## 2017-03-26 NOTE — Assessment & Plan Note (Signed)
Stable.  Continue Lyrica.  Discussed monitoring for any lesions on his feet and if they occur or appear infected he needs to be evaluated.

## 2017-03-26 NOTE — Assessment & Plan Note (Signed)
Rare symptoms.  Unchanged.  Resolved with nitroglycerin.  He follows with cardiology and reports he has discussed this with them.  Refill of nitroglycerin given.  Discussed appropriate use of this medication.  Given return precautions.

## 2017-03-26 NOTE — Assessment & Plan Note (Addendum)
Check A1c.  Continue metformin.  Advised to schedule an appointment with his eye doctor.

## 2017-03-26 NOTE — Assessment & Plan Note (Signed)
Stable. Continue tramadol.

## 2017-03-26 NOTE — Assessment & Plan Note (Signed)
History of this.  On B12 supplement.  Check B12 today.

## 2017-03-26 NOTE — Progress Notes (Signed)
Tommi Rumps, MD Phone: [phone]  Walter Salas is a 62 y.o. male who presents today for f/u.  DIABETES Disease Monitoring: Blood Sugar ranges-250s when he was sick with diverticulitis Polyuria/phagia/dipsia- no      Optho- due Medications: Compliance- taking metformin Hypoglycemic symptoms- no  Recently seen at the walk-in clinic for left lower quadrant pain and diarrhea.  Diagnosed with diverticulitis and treated empirically with ciprofloxacin and Flagyl.  Notes his symptoms have resolved.  No blood in his stool.  He is back to having normal bowel movements.  He did see GI in follow-up and they advised to continue PPI.  He did have an endoscopy.  Underwent CT scan to follow-up as well.  Note from GI reviewed.  CAD: Rarely has chest pain.  Has taken nitroglycerin 3-4 times since he last had the prescription filled.  It is now out of date.  Only has to take 1 and symptoms go away.  Describes it as centralized chest discomfort with shortness of breath.  Occurs with exertion.  He did see cardiology and discussed this with them.  No further evaluation at this time.  Continues on tramadol and Lyrica for chronic back pain and neuropathy.  He quit trazodone as this was making him drowsy.  He notes no numbness, weakness, loss of bowel or bladder function, or saddle anesthesia.  History of B12 deficiency.  Currently on oral supplementation.  Needs recheck.  He wonders about hepatitis C testing as well.  Also prostate cancer screening.  Social History   Tobacco Use  Smoking Status Current Every Day Smoker  . Packs/day: 1.00  . Years: 50.00  . Pack years: 50.00  . Types: Cigarettes  Smokeless Tobacco Never Used     ROS see history of present illness  Objective  Physical Exam Vitals:   03/26/17 0835  BP: 114/62  Pulse: 80  Temp: 98.1 F (36.7 C)  SpO2: 97%    BP Readings from Last 3 Encounters:  03/26/17 114/62  03/12/17 128/60  02/23/17 138/82   Wt Readings from Last  3 Encounters:  03/26/17 223 lb 6.4 oz (101.3 kg)  03/12/17 222 lb 8 oz (100.9 kg)  02/23/17 214 lb (97.1 kg)    Physical Exam  Constitutional: No distress.  Cardiovascular: Normal rate, regular rhythm and normal heart sounds.  Pulmonary/Chest: Effort normal and breath sounds normal.  Musculoskeletal: He exhibits no edema.  Neurological: He is alert. Gait normal.  Skin: Skin is warm and dry. He is not diaphoretic.   Diabetic Foot Exam - Simple   Simple Foot Form Diabetic Foot exam was performed with the following findings:  Yes 03/26/2017  9:15 AM  Visual Inspection See comments:  Yes Sensation Testing See comments:  Yes Pulse Check Posterior Tibialis and Dorsalis pulse intact bilaterally:  Yes Comments Thickened toenails bilaterally, dry flaky skin, no ulcerations or other deformities, intact light touch sensation, decreased monofilament testing on plantar surface of bilateral feet      Assessment/Plan: Please see individual problem list.  Coronary artery disease Rare symptoms.  Unchanged.  Resolved with nitroglycerin.  He follows with cardiology and reports he has discussed this with them.  Refill of nitroglycerin given.  Discussed appropriate use of this medication.  Given return precautions.  Diabetes mellitus, type 2 (HCC) Check A1c.  Continue metformin.  Advised to schedule an appointment with his eye doctor.  Hereditary and idiopathic peripheral neuropathy Stable.  Continue Lyrica.  Discussed monitoring for any lesions on his feet and if they  occur or appear infected he needs to be evaluated.  Chronic back pain Stable.  Continue tramadol.  Mixed hyperlipidemia Not due for lipid panel.  He will have this done through cardiology in June.  Encouraged follow-up with them at that time.  B12 deficiency History of this.  On B12 supplement.  Check B12 today.  Prostate cancer screening Check PSA.   Health Maintenance: Hepatitis C to be checked.  Foot exam  completed.  Orders Placed This Encounter  Procedures  . B12  . Hepatitis C Antibody  . PSA  . HgB A1c  . Comp Met (CMET)    Meds ordered this encounter  Medications  . nitroGLYCERIN (NITROSTAT) 0.4 MG SL tablet    Sig: Place 1 tablet (0.4 mg total) under the tongue every 5 (five) minutes as needed for chest pain. May take up to 3 doses.    Dispense:  30 tablet    Refill:  0  . DISCONTD: traMADol (ULTRAM) 50 MG tablet    Sig: Take 2 tablets (100 mg total) by mouth every 8 (eight) hours as needed for moderate pain.    Dispense:  90 tablet    Refill:  1    Not to exceed 5 additional fills before 11/11/2015.  . traMADol (ULTRAM) 50 MG tablet    Sig: Take 2 tablets (100 mg total) by mouth every 8 (eight) hours as needed for moderate pain.    Dispense:  90 tablet    Refill:  1    Not to exceed 5 additional fills before 11/11/2015.     Eric Sonnenberg, MD [facility] [place]  

## 2017-03-28 ENCOUNTER — Encounter: Payer: Self-pay | Admitting: Family Medicine

## 2017-03-28 DIAGNOSIS — A048 Other specified bacterial intestinal infections: Secondary | ICD-10-CM | POA: Insufficient documentation

## 2017-03-28 LAB — HEPATITIS C ANTIBODY
HEP C AB: NONREACTIVE
SIGNAL TO CUT-OFF: 0.03 (ref <1.00)

## 2017-04-20 ENCOUNTER — Ambulatory Visit (INDEPENDENT_AMBULATORY_CARE_PROVIDER_SITE_OTHER): Payer: Medicare Other

## 2017-04-20 ENCOUNTER — Ambulatory Visit (INDEPENDENT_AMBULATORY_CARE_PROVIDER_SITE_OTHER): Payer: Medicare Other | Admitting: Family Medicine

## 2017-04-20 ENCOUNTER — Encounter: Payer: Self-pay | Admitting: Family Medicine

## 2017-04-20 VITALS — BP 108/60 | HR 81 | Temp 98.1°F | Wt 219.0 lb

## 2017-04-20 DIAGNOSIS — R0781 Pleurodynia: Secondary | ICD-10-CM

## 2017-04-20 NOTE — Assessment & Plan Note (Signed)
Patient's discomfort is in his left ribs.  No obvious abnormalities on exam other than tenderness.  We will obtain an x-ray and if no obvious cause could consider CT imaging.  Given return precautions.

## 2017-04-20 NOTE — Patient Instructions (Signed)
Nice to see you. We are going to start with x-rays of your left ribs to determine if there is something there causing your discomfort. If this pain worsens significantly please let us know. If you develop blood in your stool, abdominal pain, trouble breathing, or any new or changing symptoms please seek medical attention.

## 2017-04-20 NOTE — Progress Notes (Signed)
  Tommi Rumps, MD Phone: [phone]  Walter Salas is a 62 y.o. male who presents today for same day.   Patient reports for the last couple of months since he had his colonoscopy he has had discomfort over his left flank.  He points to his left lower ribs on the lateral portion as the area of discomfort.  Notes it is a sharp discomfort when he pushes.  Otherwise it is a dull discomfort that is present all the time.  He notes bowel movements daily with no constipation.  No dysuria.  No fevers.  No abdominal pain.  No blood in his stool.  No radiation to his legs.  No known injury.  Possibly eating makes it worse.  This discomfort was present prior to his recent episode of diverticulitis and did not improve with the improvement of his diverticulitis symptoms.  He notes no recurrence of diverticulitis symptoms.  No dyspnea.  Social History   Tobacco Use  Smoking Status Current Every Day Smoker  . Packs/day: 1.00  . Years: 50.00  . Pack years: 50.00  . Types: Cigarettes  Smokeless Tobacco Never Used     ROS see history of present illness  Objective  Physical Exam Vitals:   04/20/17 1451  BP: 108/60  Pulse: 81  Temp: 98.1 F (36.7 C)  SpO2: 97%    BP Readings from Last 3 Encounters:  04/20/17 108/60  03/26/17 114/62  03/12/17 128/60   Wt Readings from Last 3 Encounters:  04/20/17 219 lb (99.3 kg)  03/26/17 223 lb 6.4 oz (101.3 kg)  03/12/17 222 lb 8 oz (100.9 kg)    Physical Exam  Constitutional: No distress.  Cardiovascular: Normal rate, regular rhythm and normal heart sounds.  Pulmonary/Chest: Effort normal and breath sounds normal.  Abdominal: Soft. Bowel sounds are normal. He exhibits no distension and no mass. There is no tenderness.    Musculoskeletal: He exhibits no edema.  Neurological: He is alert.  Skin: Skin is warm and dry. He is not diaphoretic.     Assessment/Plan: Please see individual problem list.  Rib pain on left side Patient's  discomfort is in his left ribs.  No obvious abnormalities on exam other than tenderness.  We will obtain an x-ray and if no obvious cause could consider CT imaging.  Given return precautions.   Orders Placed This Encounter  Procedures  . DG Ribs Unilateral Left    Standing Status:   Future    Standing Expiration Date:   06/21/2018    Order Specific Question:   Reason for Exam (SYMPTOM  OR DIAGNOSIS REQUIRED)    Answer:   left lower lateral rib pain, no injury, tender on exam    Order Specific Question:   Preferred imaging location?    Answer:   [facility] Specific Question:   Radiology Contrast Protocol - do NOT remove file path    Answer:   [email or web address]    No orders of the defined types were placed in this encounter.    Tommi Rumps, MD [place]

## 2017-04-21 ENCOUNTER — Ambulatory Visit (INDEPENDENT_AMBULATORY_CARE_PROVIDER_SITE_OTHER): Payer: Medicare Other

## 2017-04-21 VITALS — BP 110/64 | HR 83 | Temp 98.3°F | Resp 15 | Ht 70.0 in | Wt 218.4 lb

## 2017-04-21 DIAGNOSIS — Z0000 Encounter for general adult medical examination without abnormal findings: Secondary | ICD-10-CM | POA: Diagnosis not present

## 2017-04-21 NOTE — Progress Notes (Signed)
Subjective:   Walter Salas is a 62 y.o. male who presents for Medicare Annual/Subsequent preventive examination.  Review of Systems:  No ROS.  Medicare Wellness Visit. Additional risk factors are reflected in the social history.  Cardiac Risk Factors include: advanced age (>41men, >67 women);hypertension;male gender;diabetes mellitus;obesity (BMI >30kg/m2)     Objective:    Vitals: BP 110/64 (BP Location: Left Arm, Patient Position: Sitting, Cuff Size: Normal)   Pulse 83   Temp 98.3 F (36.8 C) (Oral)   Resp 15   Ht 5\' 10"  (1.778 m)   Wt 218 lb 6.4 oz (99.1 kg)   SpO2 95%   BMI 31.34 kg/m   Body mass index is 31.34 kg/m.  Advanced Directives 04/21/2017 02/23/2017 04/20/2016  Does Patient Have a Medical Advance Directive? Yes Yes Yes  Type of Paramedic of [place];Living will [place];Living will Living will  Does patient want to make changes to medical advance directive? No - Patient declined - No - Patient declined  Copy of [place] in Chart? No - copy requested No - copy requested -    Tobacco Social History   Tobacco Use  Smoking Status Current Every Day Smoker  . Packs/day: 1.00  . Years: 50.00  . Pack years: 50.00  . Types: Cigarettes  Smokeless Tobacco Never Used     Ready to quit: Not Answered Counseling given: Not Answered   Clinical Intake:  Pre-visit preparation completed: Yes  Pain : No/denies pain     Nutritional Status: BMI > 30  Obese Diabetes: Yes(Followed by [facility])  How often do you need to have someone help you when you read instructions, pamphlets, or other written materials from your doctor or pharmacy?: 1 - Never  Interpreter Needed?: No     Past Medical History:  Diagnosis Date  . Arthritis   . CAD (coronary artery disease)   . Chronic pain   . Colon polyp   . Depression   . Diabetes mellitus without complication ([place])   . Hx of laminectomy 1999    . Hyperlipidemia   . Hypertension   . Kidney stone   . Ulcer    Past Surgical History:  Procedure Laterality Date  . [place]  . CARPAL TUNNEL RELEASE Bilateral 1990  . CERVICAL FUSION  2000. 2003  . CHOLECYSTECTOMY  2000  . COLONOSCOPY WITH PROPOFOL N/A 02/23/2017   Procedure: COLONOSCOPY WITH PROPOFOL;  Surgeon: Lollie Sails, MD;  Location: [facility] ENDOSCOPY;  Service: Endoscopy;  Laterality: N/A;  . CORONARY ANGIOPLASTY WITH STENT PLACEMENT  [facility]   [facility] [employer]  . ESOPHAGOGASTRODUODENOSCOPY (EGD) WITH PROPOFOL N/A 02/23/2017   Procedure: ESOPHAGOGASTRODUODENOSCOPY (EGD) WITH PROPOFOL;  Surgeon: Lollie Sails, MD;  Location[facility] ENDOSCOPY;  Service: Endoscopy;  Laterality: N/A;  . SHOULDER ARTHROSCOPY Right 2012  . TUMOR EXCISION  2007   Family History  Problem Relation Age of Onset  . Diabetes Mother   . Heart disease Father   . Diabetes Father   . Diabetes Sister   . Heart disease Sister   . Diabetes Brother   . Heart disease Brother   . Heart disease Paternal Grandfather    Social History   Socioeconomic History  . Marital status: Married    Spouse name: Not on file  . Number of children: Not on file  . Years of education: Not on file  .  Highest education level: Not on file  Occupational History  . Not on file  Social Needs  . Financial resource strain: Not hard at all  . Food insecurity:    Worry: Never true    Inability: Never true  . Transportation needs:    Medical: No    Non-medical: No  Tobacco Use  . Smoking status: Current Every Day Smoker    Packs/day: 1.00    Years: 50.00    Pack years: 50.00    Types: Cigarettes  . Smokeless tobacco: Never Used  Substance and Sexual Activity  . Alcohol use: No  . Drug use: No  . Sexual activity: Never  Lifestyle  . Physical activity:    Days per week: Not on file    Minutes per session: Not on file  . Stress: Not on file   Relationships  . Social connections:    Talks on phone: Not on file    Gets together: Not on file    Attends religious service: Not on file    Active member of club or organization: Not on file    Attends meetings of clubs or organizations: Not on file    Relationship status: Not on file  Other Topics Concern  . Not on file  Social History Narrative   Lives in [place] with wife. Has cat.   Work - Database administrator, retired      Followed at [employer].      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      Diet - Regular   Exercise - starting back at gym    Outpatient Encounter Medications as of 04/21/2017  Medication Sig  . aspirin 81 MG tablet Take 81 mg by mouth daily.  Marland Kitchen atorvastatin (LIPITOR) 80 MG tablet Take 1 tablet (80 mg total) by mouth daily.  . chlorthalidone (HYGROTON) 50 MG tablet Take 25 mg by mouth daily.  . cholecalciferol (VITAMIN D) 1000 UNITS tablet Take 1 tablet (1,000 Units total) by mouth QID.  . DULoxetine (CYMBALTA) 20 MG capsule Take 20 mg by mouth daily.  . fenofibrate (TRICOR) 145 MG tablet Take 1 tablet (145 mg total) by mouth daily.  Marland Kitchen lidocaine (LIDODERM) 5 % Place 1 patch onto the skin daily. Remove & Discard patch within 12 hours or as directed by MD  . losartan (COZAAR) 25 MG tablet Take 25 mg by mouth daily.  . metFORMIN (GLUCOPHAGE) 1000 MG tablet Take 1,000 mg by mouth 2 (two) times daily with a meal.  . methocarbamol (ROBAXIN) 500 MG tablet Take 500 mg by mouth as directed.  . methyl salicylate liquid Apply 1 application topically as needed for muscle pain.  . nitroGLYCERIN (NITROSTAT) 0.4 MG SL tablet Place 1 tablet (0.4 mg total) under the tongue every 5 (five) minutes as needed for chest pain. May take up to 3 doses.  . polyvinyl alcohol (LIQUIFILM TEARS) 1.4 % ophthalmic solution 1 drop as needed for dry eyes.  . potassium chloride SA (K-DUR,KLOR-CON) 20 MEQ tablet Take 1 tablet (20 mEq total) by mouth daily.  . pregabalin  (LYRICA) 50 MG capsule Take 50 mg by mouth 3 (three) times daily.  . traMADol (ULTRAM) 50 MG tablet Take 2 tablets (100 mg total) by mouth every 8 (eight) hours as needed for moderate pain.  . vitamin B-12 (CYANOCOBALAMIN) 1000 MCG tablet Take 1 tablet (1,000 mcg total) by mouth daily.   No facility-administered encounter medications on file as of 04/21/2017.  Activities of Daily Living In your present state of health, do you have any difficulty performing the following activities: 04/21/2017  Hearing? N  Vision? N  Difficulty concentrating or making decisions? N  Walking or climbing stairs? Y  Comment Back pain when walking long distances   Dressing or bathing? N  Doing errands, shopping? N  Preparing Food and eating ? N  Using the Toilet? N  In the past six months, have you accidently leaked urine? N  Comment Followed by Urologist.   Do you have problems with loss of bowel control? N  Managing your Medications? N  Managing your Finances? N  Housekeeping or managing your Housekeeping? N  Some recent data might be hidden    Patient Care Team: Leone Haven, MD as PCP - General (Family Medicine) Jackolyn Confer, MD (Internal Medicine) Bary Castilla Forest Gleason, MD (General Surgery)   Assessment:   This is a routine wellness examination for Khyran. The goal of the wellness visit is to assist the patient how to close the gaps in care and create a preventative care plan for the patient.   The roster of all physicians providing medical care to patient is listed in the Snapshot section of the chart.  Taking calcium VIT D as appropriate/Osteoporosis risk reviewed.    Safety issues reviewed; Smoke and carbon monoxide detectors in the home. No firearms in the home. Wears seatbelts when driving or riding with others. No violence in the home.  They do not have excessive sun exposure.  Discussed the need for sun protection: hats, long sleeves and the use of sunscreen if there is  significant sun exposure.  Patient is alert, normal appearance, oriented to person/place/and time.  Correctly identified the president of the [place] and recalls of 2/3 words. Performs simple calculations and can read correct time from watch face. Displays appropriate judgement.  No new identified risk were noted.  No failures at ADL's or IADL's.    BMI- discussed the importance of a healthy diet, water intake and the benefits of aerobic exercise. Educational material provided.   24 hour diet recall: Diabetic diet  Eye- Visual acuity not assessed per patient preference since they have regular follow up with the ophthalmologist.  Wears corrective lenses.  Sleep patterns- Sleeps without issues.    HIV screening discussed.  Educational material provided.    Patient Concerns: None at this time. Follow up with PCP as needed.  Exercise Activities and Dietary recommendations Current Exercise Habits: The patient does not participate in regular exercise at present  Goals    . Increase physical activity       Fall Risk Fall Risk  04/21/2017 04/20/2016 10/24/2014  Falls in the past year? Yes Yes No  Number falls in past yr: 2 or more 2 or more -  Injury with Fall? No No -  Risk Factor Category  High Fall Risk High Fall Risk -  Risk for fall due to : History of fall(s) History of fall(s) -  Follow up Falls prevention discussed Education provided;Falls prevention discussed -  Comment - Leg weakness -   Depression Screen PHQ 2/9 Scores 04/21/2017 05/11/2016 04/20/2016 10/24/2014  PHQ - 2 Score 0 0 0 0  PHQ- 9 Score - 2 - -    Cognitive Function MMSE - Mini Mental State Exam 04/21/2017 04/20/2016  Orientation to time 5 5  Orientation to Place 5 5  Registration 3 3  Attention/ Calculation 5 5  Recall 2 3  Language- name  2 objects 2 2  Language- repeat 1 1  Language- follow 3 step command 3 3  Language- read & follow direction 1 1  Write a sentence 1 1  Copy design 1 1  Total score 29  30        Immunization History  Administered Date(s) Administered  . Influenza,inj,Quad PF,6+ Mos 11/14/2013, 11/15/2015  . Influenza-Unspecified 10/28/2012, 10/19/2014  . Pneumococcal Polysaccharide-23 04/28/2013  . Tdap 04/28/2013   Screening Tests Health Maintenance  Topic Date Due  . HIV Screening  11/07/1970  . OPHTHALMOLOGY EXAM  01/27/2017  . INFLUENZA VACCINE  08/12/2017  . HEMOGLOBIN A1C  09/26/2017  . FOOT EXAM  03/27/2018  . PNEUMOCOCCAL POLYSACCHARIDE VACCINE (2) 04/29/2018  . COLONOSCOPY  02/23/2022  . TETANUS/TDAP  04/29/2023  . Hepatitis C Screening  Completed      Plan:    End of life planning; Advance aging; Advanced directives discussed. Copy of current HCPOA/Living Will requested.    I have personally reviewed and noted the following in the patient's chart:   . Medical and social history . Use of alcohol, tobacco or illicit drugs  . Current medications and supplements . Functional ability and status . Nutritional status . Physical activity . Advanced directives . List of other physicians . Hospitalizations, surgeries, and ER visits in previous 12 months . Vitals . Screenings to include cognitive, depression, and falls . Referrals and appointments  In addition, I have reviewed and discussed with patient certain preventive protocols, quality metrics, and best practice recommendations. A written personalized care plan for preventive services as well as general preventive health recommendations were provided to patient.     Varney Biles, LPN  1/70/0174

## 2017-04-21 NOTE — Patient Instructions (Addendum)
  [name] , Thank you for taking time to come for your Medicare Wellness Visit. I appreciate your ongoing commitment to your health goals. Please review the following plan we discussed and let me know if I can assist you in the future.   Follow up as needed.    Bring a copy of your [place] and/or Living Will to be scanned into chart.  Have a great day!  These are the goals we discussed: Goals    . Increase physical activity       This is a list of the screening recommended for you and due dates:  Health Maintenance  Topic Date Due  . HIV Screening  11/07/1970  . Eye exam for diabetics  01/27/2017  . Flu Shot  08/12/2017  . Hemoglobin A1C  09/26/2017  . Complete foot exam   03/27/2018  . Pneumococcal vaccine (2) 04/29/2018  . Colon Cancer Screening  02/23/2022  . Tetanus Vaccine  04/29/2023  .  Hepatitis C: One time screening is recommended by Center for Disease Control  (CDC) for  adults born from [birth year] through 1967.   Completed

## 2017-06-28 ENCOUNTER — Ambulatory Visit (INDEPENDENT_AMBULATORY_CARE_PROVIDER_SITE_OTHER): Payer: Medicare Other | Admitting: Family Medicine

## 2017-06-28 ENCOUNTER — Encounter: Payer: Self-pay | Admitting: Family Medicine

## 2017-06-28 VITALS — BP 132/70 | HR 73 | Temp 98.3°F | Ht 70.0 in | Wt 223.2 lb

## 2017-06-28 DIAGNOSIS — I10 Essential (primary) hypertension: Secondary | ICD-10-CM | POA: Diagnosis not present

## 2017-06-28 DIAGNOSIS — G8929 Other chronic pain: Secondary | ICD-10-CM

## 2017-06-28 DIAGNOSIS — M545 Low back pain: Secondary | ICD-10-CM

## 2017-06-28 DIAGNOSIS — E782 Mixed hyperlipidemia: Secondary | ICD-10-CM | POA: Diagnosis not present

## 2017-06-28 DIAGNOSIS — I77810 Thoracic aortic ectasia: Secondary | ICD-10-CM | POA: Diagnosis not present

## 2017-06-28 DIAGNOSIS — Z720 Tobacco use: Secondary | ICD-10-CM | POA: Diagnosis not present

## 2017-06-28 DIAGNOSIS — Z87891 Personal history of nicotine dependence: Secondary | ICD-10-CM | POA: Insufficient documentation

## 2017-06-28 DIAGNOSIS — E119 Type 2 diabetes mellitus without complications: Secondary | ICD-10-CM | POA: Diagnosis not present

## 2017-06-28 LAB — POCT GLYCOSYLATED HEMOGLOBIN (HGB A1C): Hemoglobin A1C: 7.4 % — AB (ref 4.0–5.6)

## 2017-06-28 MED ORDER — TRAMADOL HCL 50 MG PO TABS: 100.0000 mg | ORAL_TABLET | Freq: Three times a day (TID) | ORAL | 1 refills | Status: DC | PRN

## 2017-06-28 MED ORDER — SITAGLIPTIN PHOSPHATE 100 MG PO TABS: 100.0000 mg | ORAL_TABLET | Freq: Every day | ORAL | 1 refills | Status: DC

## 2017-06-28 NOTE — Assessment & Plan Note (Signed)
Discussed smoking cessation.  Patient is not ready to quit.

## 2017-06-28 NOTE — Assessment & Plan Note (Signed)
Continue current regimen.  Plan to recheck lipids at next visit.

## 2017-06-28 NOTE — Assessment & Plan Note (Signed)
Adequately controlled.  Continue current medication. 

## 2017-06-28 NOTE — Assessment & Plan Note (Addendum)
Stable.  Neurologically intact in lower extremities.  Continue tramadol.  Drug database reviewed.

## 2017-06-28 NOTE — Progress Notes (Signed)
Tommi Rumps, MD Phone: [phone]  Walter Salas is a 62 y.o. male who presents today for f/u.  CC: dm,. Htn, hld  HYPERTENSION Disease Monitoring: Blood pressure range-120/70 Chest pain- no      Dyspnea- no Medications: Compliance- taking losartan, chlorthalidone    Edema- no  DIABETES Disease Monitoring: Blood Sugar ranges-150-160s Polyuria/phagia/dipsia- no      Optho- due, he is going to make an appointment Medications: Compliance- taking metformin Hypoglycemic symptoms- no  HYPERLIPIDEMIA Disease Monitoring: See symptoms for Hypertension Medications: Compliance- taking lipitor Right upper quadrant pain- no  Muscle aches- no  Low back pain: The tramadol works quite well for this.  He takes it occasionally.  No drowsiness with this.  He does not drink alcohol.  He has pain radiating to his legs chronically.  No incontinence.  No numbness.  Feels like his legs will occasionally give out on him.  No saddle anesthesia.  He recently saw cardiology and they ordered CT scan of his chest to follow-up on thoracic aortic aneurysm.  Patient continues to smoke.  He has tried to quit in the past with medications and patches with no luck.  Not ready to quit.  Social History   Tobacco Use  Smoking Status Current Every Day Smoker  . Packs/day: 1.00  . Years: 50.00  . Pack years: 50.00  . Types: Cigarettes  Smokeless Tobacco Never Used     ROS see history of present illness  Objective  Physical Exam Vitals:   06/28/17 0847  BP: 132/70  Pulse: 73  Temp: 98.3 F (36.8 C)  SpO2: 96%    BP Readings from Last 3 Encounters:  06/28/17 132/70  04/21/17 110/64  04/20/17 108/60   Wt Readings from Last 3 Encounters:  06/28/17 223 lb 3.2 oz (101.2 kg)  04/21/17 218 lb 6.4 oz (99.1 kg)  04/20/17 219 lb (99.3 kg)    Physical Exam  Constitutional: No distress.  Cardiovascular: Normal rate, regular rhythm and normal heart sounds.  Pulmonary/Chest: Effort normal and  breath sounds normal.  Musculoskeletal: He exhibits no edema.  No midline spine tenderness, no midline spine step-off, no muscular back tenderness  Neurological: He is alert.  5/5 strength bilateral quads, hamstrings, plantar flexion, and dorsiflexion, sensation light touch intact bilateral lower extremities  Skin: Skin is warm and dry. He is not diaphoretic.     Assessment/Plan: Please see individual problem list.  Essential hypertension Adequately controlled.  Continue current medication.  Dilated aortic root ([place]) Follow-up imaging has been ordered by cardiology.  Patient will make sure he follows up with them.  Diabetes mellitus, type 2 (HCC) A1c slightly worse compared to prior.  It is above goal.  We will add Januvia.  Plan to recheck in 3 months.  Chronic back pain Stable.  Neurologically intact in lower extremities.  Continue tramadol.  Drug database reviewed.  Tobacco abuse Discussed smoking cessation.  Patient is not ready to quit.  Mixed hyperlipidemia Continue current regimen.  Plan to recheck lipids at next visit.   Orders Placed This Encounter  Procedures  . POCT HgB A1C    Meds ordered this encounter  Medications  . traMADol (ULTRAM) 50 MG tablet    Sig: Take 2 tablets (100 mg total) by mouth every 8 (eight) hours as needed for moderate pain.    Dispense:  90 tablet    Refill:  1    Not to exceed 5 additional fills before 11/11/2015.  . sitaGLIPtin (JANUVIA) 100 MG tablet  Sig: Take 1 tablet (100 mg total) by mouth daily.    Dispense:  90 tablet    Refill:  1     Tommi Rumps, MD [place]

## 2017-06-28 NOTE — Assessment & Plan Note (Signed)
Follow-up imaging has been ordered by cardiology.  Patient will make sure he follows up with them.

## 2017-06-28 NOTE — Patient Instructions (Signed)
Nice to see you. Residual message with your A1c results. Please try to stay active and monitor your diet. If you would like to quit smoking please let us know. Please make sure you follow-up with cardiology as they wanted you to have a CT scan of your chest completed. I have refilled your tramadol.  If this makes you drowsy please do not drive and please let us know.

## 2017-06-28 NOTE — Assessment & Plan Note (Signed)
A1c slightly worse compared to prior.  It is above goal.  We will add Januvia.  Plan to recheck in 3 months.

## 2017-10-18 ENCOUNTER — Ambulatory Visit (INDEPENDENT_AMBULATORY_CARE_PROVIDER_SITE_OTHER): Payer: Medicare Other | Admitting: Family Medicine

## 2017-10-18 ENCOUNTER — Encounter: Payer: Self-pay | Admitting: Family Medicine

## 2017-10-18 VITALS — BP 120/70 | HR 71 | Temp 98.2°F | Ht 70.0 in | Wt 215.2 lb

## 2017-10-18 DIAGNOSIS — E782 Mixed hyperlipidemia: Secondary | ICD-10-CM

## 2017-10-18 DIAGNOSIS — I77810 Thoracic aortic ectasia: Secondary | ICD-10-CM

## 2017-10-18 DIAGNOSIS — M545 Low back pain, unspecified: Secondary | ICD-10-CM

## 2017-10-18 DIAGNOSIS — E669 Obesity, unspecified: Secondary | ICD-10-CM

## 2017-10-18 DIAGNOSIS — E119 Type 2 diabetes mellitus without complications: Secondary | ICD-10-CM | POA: Diagnosis not present

## 2017-10-18 DIAGNOSIS — Z23 Encounter for immunization: Secondary | ICD-10-CM | POA: Diagnosis not present

## 2017-10-18 DIAGNOSIS — G8929 Other chronic pain: Secondary | ICD-10-CM

## 2017-10-18 DIAGNOSIS — Z720 Tobacco use: Secondary | ICD-10-CM

## 2017-10-18 LAB — COMPREHENSIVE METABOLIC PANEL
ALBUMIN: 4.5 g/dL (ref 3.5–5.2)
ALT: 12 U/L (ref 0–53)
AST: 13 U/L (ref 0–37)
Alkaline Phosphatase: 58 U/L (ref 39–117)
BUN: 11 mg/dL (ref 6–23)
CALCIUM: 9.6 mg/dL (ref 8.4–10.5)
CHLORIDE: 97 meq/L (ref 96–112)
CO2: 32 meq/L (ref 19–32)
CREATININE: 0.73 mg/dL (ref 0.40–1.50)
GFR: 114.99 mL/min (ref >60.00)
Glucose, Bld: 137 mg/dL — ABNORMAL HIGH (ref 70–99)
POTASSIUM: 4.3 meq/L (ref 3.5–5.1)
Sodium: 136 mEq/L (ref 135–145)
Total Bilirubin: 0.6 mg/dL (ref 0.2–1.2)
Total Protein: 7 g/dL (ref 6.0–8.3)

## 2017-10-18 LAB — HEMOGLOBIN A1C: Hgb A1c MFr Bld: 6.6 % — ABNORMAL HIGH (ref 4.6–6.5)

## 2017-10-18 LAB — LIPID PANEL
CHOL/HDL RATIO: 4
Cholesterol: 135 mg/dL (ref 0–200)
HDL: 34.5 mg/dL — AB (ref >39.00)
LDL CALC: 73 mg/dL (ref 0–99)
NONHDL: 100.56
TRIGLYCERIDES: 139 mg/dL (ref 0.0–149.0)
VLDL: 27.8 mg/dL (ref 0.0–40.0)

## 2017-10-18 MED ORDER — TRAMADOL HCL 50 MG PO TABS: 100.0000 mg | ORAL_TABLET | Freq: Three times a day (TID) | ORAL | 1 refills | Status: DC | PRN

## 2017-10-18 NOTE — Progress Notes (Signed)
Tommi Rumps, MD Phone: [phone]  Walter Salas is a 62 y.o. male who presents today for f/u.  CC: dm, chronic back pain, hld, obesity  DIABETES Disease Monitoring: Blood Sugar ranges-<120 Polyuria/phagia/dipsia- no      Optho- has an appointment in December Medications: Compliance- taking januvia, metformin Hypoglycemic symptoms- no  HYPERLIPIDEMIA Symptoms Chest pain on exertion:  no   Medications: Compliance- taking lipitor Right upper quadrant pain- no  Muscle aches- no  Obesity: Patient is down about 11 pounds per his report.  He notes he has been walking some for exercise though mostly it has been dietary changes.  He quit eating fried foods.  He is baking everything.  Chronic low back pain: Notes this has been going on for years.  Occasionally radiates down his left greater than right leg posteriorly.  Notes his left leg feels weak at times though that has been chronic and stable for 4 to 5 years.  Last MRI was reportedly 4 to 5 years ago.  It appears he had a CT of his lumbar spine in 2014.  After a fall that did not reveal any lumbar spine abnormalities though he did have thoracic spine fractures noted.  He notes the tramadol is helpful.  It does not make him drowsy.  No seizure history.  He takes Lyrica for neuropathy which is beneficial.  Tobacco abuse: Patient smokes 1 pack/day.  He has tried to quit in the past though is not ready to quit at this time.  Patient is supposed to be following with cardiology for dilated aortic root.  It appears to been over a year since he is seeing them.  Social History   Tobacco Use  Smoking Status Current Every Day Smoker  . Packs/day: 1.00  . Years: 50.00  . Pack years: 50.00  . Types: Cigarettes  Smokeless Tobacco Never Used     ROS see history of present illness  Objective  Physical Exam Vitals:   10/18/17 0911  BP: 120/70  Pulse: 71  Temp: 98.2 F (36.8 C)  SpO2: 97%    BP Readings from Last 3 Encounters:   10/18/17 120/70  06/28/17 132/70  04/21/17 110/64   Wt Readings from Last 3 Encounters:  10/18/17 215 lb 3.2 oz (97.6 kg)  06/28/17 223 lb 3.2 oz (101.2 kg)  04/21/17 218 lb 6.4 oz (99.1 kg)    Physical Exam  Constitutional: No distress.  Cardiovascular: Normal rate, regular rhythm and normal heart sounds.  Pulmonary/Chest: Effort normal and breath sounds normal.  Musculoskeletal: He exhibits no edema.  Neurological: He is alert.  Skin: Skin is warm and dry. He is not diaphoretic.     Assessment/Plan: Please see individual problem list.  Chronic back pain Stable.  He will continue tramadol.  He will monitor for drowsiness.  Controlled substance database reviewed.  Diabetes mellitus, type 2 ([place]) Seems to be well controlled.  Continue current regimen.  Check A1c.  Dilated aortic root ([place]) Will refer back to cardiology for this.  Mixed hyperlipidemia Continue Lipitor.  Check lipid panel.  Obesity (BMI 30.0-34.9) Continue diet and exercise.  Congratulated on weight loss.  Tobacco abuse Encourage smoking cessation.   Orders Placed This Encounter  Procedures  . Flu Vaccine QUAD 6+ mos PF IM (Fluarix Quad PF)  . Lipid panel  . HgB A1c  . Comp Met (CMET)  . Ambulatory referral to Cardiology    Referral Priority:   Routine    Referral Type:   Consultation  Referral Reason:   Specialty Services Required    Requested Specialty:   Cardiology    Number of Visits Requested:   1    Meds ordered this encounter  Medications  . traMADol (ULTRAM) 50 MG tablet    Sig: Take 2 tablets (100 mg total) by mouth every 8 (eight) hours as needed for moderate pain.    Dispense:  90 tablet    Refill:  1    Not to exceed 5 additional fills before 11/11/2015.     Tommi Rumps, MD [place]

## 2017-10-18 NOTE — Patient Instructions (Signed)
Nice to see you. We will get lab work today and contact you with the results. Please try to quit smoking.

## 2017-10-18 NOTE — Assessment & Plan Note (Addendum)
Stable.  He will continue tramadol.  He will monitor for drowsiness.  Controlled substance database reviewed.

## 2017-10-21 DIAGNOSIS — E66811 Obesity, class 1: Secondary | ICD-10-CM | POA: Insufficient documentation

## 2017-10-21 DIAGNOSIS — E669 Obesity, unspecified: Secondary | ICD-10-CM | POA: Insufficient documentation

## 2017-10-21 NOTE — Assessment & Plan Note (Signed)
Will refer back to cardiology for this.

## 2017-10-21 NOTE — Assessment & Plan Note (Signed)
Seems to be well controlled.  Continue current regimen.  Check A1c. 

## 2017-10-21 NOTE — Assessment & Plan Note (Signed)
Encourage smoking cessation

## 2017-10-21 NOTE — Assessment & Plan Note (Signed)
Continue diet and exercise.  Congratulated on weight loss. 

## 2017-10-21 NOTE — Assessment & Plan Note (Signed)
-

## 2017-11-20 NOTE — Progress Notes (Signed)
Cardiology Office Note Date:  11/22/2017  Patient ID:  Walter Salas 1955-06-04, MRN [id number] PCP:  Leone Haven, MD  Cardiologist:  Dr. Saunders Revel, MD    Chief Complaint: Follow up  History of Present Illness: Walter Salas is a 62 y.o. male with history of CAD s/p PCI/DES to the LCx and RCA in 2014 in a staged procedure at the [facility], dilated aortic root, DM2, HTN, HLD, depression, renal stones, and prior fall with ICH who presents for evaluation of chest pain.   Patient initially established with Dr. Saunders Revel in 04/2016 after suffering several syncopal episodes in the preceding month. He underwent TTE that showed EF 60-65%, normal wall motion, Gr1DD, mild aortic valve calcification, mildly dilated aortic root measuring up to 4.2 cm, normal RV cavity size and systolic function, normal PASP. 30-day event monitor showed sinus rhythm with frist degree AV block, no significant arrhythmias or pauses. Myoview showed a small in size, mild in severity fixed basal and anteriolateral defect, no ischemia, LVEF 55-65%, low risk study. No clear cause for his syncope was identified. Since these syncopal episodes, his metoprolol and glipizide have been discontinud and his trazodone has been tapered. He was seen in 06/2016 and denied any further syncopal episodes. He had noticed a vague chest discomfort that was not responsive to SL NTG and felt different than the pain experienced with his PCIs in 2014. He attributed this pain to gas. Blood pressure at home has been running in the low 315Q systolic after tapering off some of his medications. He underwent CTA of the chest in 06/2016 to further characterize his dilated aortic root that showed an ascending thoracic aortic aneurysmal dilatation with a maximum diameter of 4.3 cm. There was an incidental 14 mm left thyroid nodule noted. FNA of this nodule in 0/0867 showed a follicular lesion of undetermined significance. He underwent EGD in 02/2017 for dysphagia that  showed H pylori with moderate inflammation and no evidence of high grade malignancy. This was followed by CT of the esophagus that was negative for large mass. He was last seen in our office in 03/2017 for follow up and was doing well, denying any further syncopal episodes. BP was well controlled. Follow up CTA aorta was advised for 06/2017, however this is not yet completed.   Since he was last seen in 03/2017, his BP has been well controlled at outside offices. Most recent lipid panel form 10/2017 showed an LDL of 73 (goal < 70), normal LFT, SCr 0.73, K+ 4.3, A1c 6.6.   He comes in feeling well today.  He continues to note a sharp substernal chest pain that has been intermittent and present since his PCI years prior.  Pain does not feel similar to his pain experience leading up to his PCI.  He last had this pain approximately 3 weeks prior.  Pain will last approximately 15 to 20 minutes and spontaneously resolved.  He has taken nitroglycerin for this pain as well.  No associated symptoms.  No further syncope.  Blood pressure is typically well controlled at home in the 1 teens to 619J systolic.  He reports compliance with medications.  He denies any dizziness, presyncope, or syncope.  Unfortunately, he continues to smoke 1.5 packs of cigarettes daily.  He indicates he is tried everything to quit and has been unsuccessful.  He is under increased stress at home as his wife is being evaluated at the cancer center.  He has a stable 3 pillow orthopnea.  He denies any early satiety or lower extremity swelling.  No falls since he was last seen.  No BRBPR or melena.   Past Medical History:  Diagnosis Date  . Arthritis   . CAD (coronary artery disease)    a. s/p PCI/DES to the LCx and RCA in 2014  . Chronic pain   . Colon polyp   . Depression   . Diabetes mellitus without complication ([place])   . Hx of laminectomy 1999  . Hyperlipidemia   . Hypertension   . Kidney stone   . Syncope    a. Event monitor 2018:  NSR with first-degree AV block, no significant arrhythmias or pauses; b.  Myoview showed a small in size, mild in severity fixed basal and anterior lateral defect, no ischemia, LVEF 55 to 65%, low risk study  . Thoracic aortic aneurysm ([place])    a. TTE 2018: EF 60-65%, normal wall motion, Gr1DD, mild aortic valve calcification, mildly dilated aortic root measuring up to 4.2 cm, normal RV cavity size and systolic function, normal PASP; b. CTA aorta 6/18:showed an ascending thoracic aortic aneurysmal dilatation with a maximum diameter of 4.3 cm  . Ulcer     Past Surgical History:  Procedure Laterality Date  . [place]  . CARPAL TUNNEL RELEASE Bilateral 1990  . CERVICAL FUSION  2000. 2003  . CHOLECYSTECTOMY  2000  . COLONOSCOPY WITH PROPOFOL N/A 02/23/2017   Procedure: COLONOSCOPY WITH PROPOFOL;  Surgeon: Lollie Sails, MD;  Location: [facility] ENDOSCOPY;  Service: Endoscopy;  Laterality: N/A;  . CORONARY ANGIOPLASTY WITH STENT PLACEMENT  2014   [facility] [employer]  . ESOPHAGOGASTRODUODENOSCOPY (EGD) WITH PROPOFOL N/A 02/23/2017   Procedure: ESOPHAGOGASTRODUODENOSCOPY (EGD) WITH PROPOFOL;  Surgeon: Lollie Sails, MD;  Location[facility] ENDOSCOPY;  Service: Endoscopy;  Laterality: N/A;  . SHOULDER ARTHROSCOPY Right 2012  . TUMOR EXCISION  2007    Current Meds  Medication Sig  . aspirin 81 MG tablet Take 81 mg by mouth daily.  Marland Kitchen atorvastatin (LIPITOR) 80 MG tablet Take 1 tablet (80 mg total) by mouth daily.  . chlorthalidone (HYGROTON) 50 MG tablet Take 25 mg by mouth daily.  . cholecalciferol (VITAMIN D) 1000 UNITS tablet Take 1 tablet (1,000 Units total) by mouth QID.  . DULoxetine (CYMBALTA) 20 MG capsule Take 20 mg by mouth daily.  . fenofibrate (TRICOR) 145 MG tablet Take 1 tablet (145 mg total) by mouth daily.  Marland Kitchen lidocaine (LIDODERM) 5 % Place 1 patch onto the skin daily. Remove & Discard patch within 12 hours or as directed by MD    . losartan (COZAAR) 25 MG tablet Take 25 mg by mouth daily.  . metFORMIN (GLUCOPHAGE) 1000 MG tablet Take 1,000 mg by mouth 2 (two) times daily with a meal.  . methocarbamol (ROBAXIN) 500 MG tablet Take 500 mg by mouth as directed.  . methyl salicylate liquid Apply 1 application topically as needed for muscle pain.  . nitroGLYCERIN (NITROSTAT) 0.4 MG SL tablet Place 1 tablet (0.4 mg total) under the tongue every 5 (five) minutes as needed for chest pain. May take up to 3 doses.  . polyvinyl alcohol (LIQUIFILM TEARS) 1.4 % ophthalmic solution 1 drop as needed for dry eyes.  . potassium chloride SA (K-DUR,KLOR-CON) 20 MEQ tablet Take 1 tablet (20 mEq total) by mouth daily.  . pregabalin (LYRICA) 50 MG capsule Take 50 mg by mouth 3 (three) times daily.  . sitaGLIPtin (JANUVIA)  100 MG tablet Take 1 tablet (100 mg total) by mouth daily.  . traMADol (ULTRAM) 50 MG tablet Take 2 tablets (100 mg total) by mouth every 8 (eight) hours as needed for moderate pain.  . vitamin B-12 (CYANOCOBALAMIN) 1000 MCG tablet Take 1 tablet (1,000 mcg total) by mouth daily.    Allergies:   Gabapentin   Social History:  The patient  reports that he has been smoking cigarettes. He has a 50.00 pack-year smoking history. He has never used smokeless tobacco. He reports that he does not drink alcohol or use drugs.   Family History:  The patient's family history includes Diabetes in his brother, father, mother, and sister; Heart disease in his brother, father, paternal grandfather, and sister.  ROS:   Review of Systems  Constitutional: Negative for chills, diaphoresis, fever, malaise/fatigue and weight loss.  HENT: Negative for congestion.   Eyes: Negative for discharge and redness.  Respiratory: Negative for cough, hemoptysis, sputum production, shortness of breath and wheezing.   Cardiovascular: Positive for chest pain. Negative for palpitations, orthopnea, claudication, leg swelling and PND.  Gastrointestinal:  Negative for abdominal pain, blood in stool, heartburn, melena, nausea and vomiting.  Genitourinary: Negative for hematuria.  Musculoskeletal: Negative for falls and myalgias.  Skin: Negative for rash.  Neurological: Negative for dizziness, tingling, tremors, sensory change, speech change, focal weakness, loss of consciousness and weakness.  Endo/Heme/Allergies: Does not bruise/bleed easily.  Psychiatric/Behavioral: Negative for substance abuse. The patient is not nervous/anxious.   All other systems reviewed and are negative.    PHYSICAL EXAM:  VS:  BP 130/72 (BP Location: Left Arm, Patient Position: Sitting, Cuff Size: Normal)   Pulse 76   Ht 5\' 8"  (1.727 m)   Wt 224 lb (101.6 kg)   BMI 34.06 kg/m  BMI: Body mass index is 34.06 kg/m.  Physical Exam  Constitutional: He is oriented to person, place, and time. He appears well-developed and well-nourished.  HENT:  Head: Normocephalic and atraumatic.  Eyes: Right eye exhibits no discharge. Left eye exhibits no discharge.  Neck: Normal range of motion. No JVD present.  Cardiovascular: Normal rate, regular rhythm, S1 normal, S2 normal and normal heart sounds. Exam reveals no distant heart sounds, no friction rub, no midsystolic click and no opening snap.  No murmur heard. Pulses:      Posterior tibial pulses are 2+ on the right side, and 2+ on the left side.  Pulmonary/Chest: Effort normal and breath sounds normal. No respiratory distress. He has no decreased breath sounds. He has no wheezes. He has no rales. He exhibits no tenderness.  Abdominal: Soft. He exhibits no distension. There is no tenderness.  Musculoskeletal: He exhibits no edema.  Neurological: He is alert and oriented to person, place, and time.  Skin: Skin is warm and dry. No cyanosis. Nails show no clubbing.  Psychiatric: He has a normal mood and affect. His speech is normal and behavior is normal. Judgment and thought content normal.     EKG:  Was ordered and  interpreted by me today. Shows NSR, 76 bpm, 1st degree AV block, low voltage QRS, no acute st/t changes  Recent Labs: 10/18/2017: ALT 12; BUN 11; Creatinine, Ser 0.73; Potassium 4.3; Sodium 136  10/18/2017: Cholesterol 135; HDL 34.50; LDL Cholesterol 73; Total CHOL/HDL Ratio 4; Triglycerides 139.0; VLDL 27.8   CrCl cannot be calculated (Patient's most recent lab result is older than the maximum 21 days allowed.).   Wt Readings from Last 3 Encounters:  11/22/17 224 lb (  101.6 kg)  10/18/17 215 lb 3.2 oz (97.6 kg)  06/28/17 223 lb 3.2 oz (101.2 kg)     Other studies reviewed: Additional studies/records reviewed today include: summarized above  ASSESSMENT AND PLAN:  1. Dilated aortic root/thoracic aortic aneurysm: Asymptomatic.  Patient is 5 months overdue for routine screening aortic CTA.  This has been rescheduled today.  Discussion with patient's regarding optimal blood pressure control, lipid control, tobacco cessation, for quinolone antibiotic use avoidance, and exertional restrictions.  Patient does not have any biological children.  Continue with optimization of blood pressure control, heart rate control, lipid control, and tobacco cessation.  Await CTA results.  2. CAD involving the native coronary arteries with stable angina: Patient has noted intermittent chest pain since his PCI in 2014.  Pain is not similar to symptoms that led to his PCI.  We will proceed with CTA of the aorta as above.  In follow-up, consider Lexiscan Myoview.  Continue aggressive risk factor modification and secondary prevention with aspirin, fenofibrate, losartan, and sublingual nitroglycerin as needed.  We will add Imdur 30 mg daily.  Not on beta-blocker as below.  3. History of syncope: No further syncopal events since 04/2016.  Symptoms have improved following discontinuation of metoprolol, glipizide, and trazodone.  Continue to monitor.  4. Hypertension: Recheck blood pressure improved at 111/62.  Continue  current medications as above.  5. Hyperlipidemia: Most recent LDL of 73 from 10/2017.  Remains on Lipitor and fenofibrate.  Disposition: F/u with Dr. Saunders Revel or an APP in 2 months.    Current medicines are reviewed at length with the patient today.  The patient did not have any concerns regarding medicines.  Signed, Christell Faith, PA-C 11/22/2017 10:09 AM     [place] [address] [place] [address] [place], [place] [address] [phone]

## 2017-11-22 ENCOUNTER — Ambulatory Visit: Payer: Medicare Other | Admitting: Physician Assistant

## 2017-11-22 ENCOUNTER — Encounter: Payer: Self-pay | Admitting: Physician Assistant

## 2017-11-22 VITALS — BP 130/72 | HR 76 | Ht 68.0 in | Wt 224.0 lb

## 2017-11-22 DIAGNOSIS — I712 Thoracic aortic aneurysm, without rupture, unspecified: Secondary | ICD-10-CM

## 2017-11-22 DIAGNOSIS — I77810 Thoracic aortic ectasia: Secondary | ICD-10-CM | POA: Diagnosis not present

## 2017-11-22 DIAGNOSIS — E782 Mixed hyperlipidemia: Secondary | ICD-10-CM

## 2017-11-22 DIAGNOSIS — R55 Syncope and collapse: Secondary | ICD-10-CM

## 2017-11-22 DIAGNOSIS — I25118 Atherosclerotic heart disease of native coronary artery with other forms of angina pectoris: Secondary | ICD-10-CM | POA: Diagnosis not present

## 2017-11-22 DIAGNOSIS — I10 Essential (primary) hypertension: Secondary | ICD-10-CM | POA: Diagnosis not present

## 2017-11-22 MED ORDER — ISOSORBIDE MONONITRATE ER 30 MG PO TB24: 30.0000 mg | ORAL_TABLET | Freq: Every day | ORAL | 1 refills | Status: DC

## 2017-11-22 NOTE — Patient Instructions (Addendum)
Medication Instructions:  START Imdur 30 mg daily  If you need a refill on your cardiac medications before your next appointment, please call your pharmacy.   Lab work: Your provider would like for you to have the following labs today: BMET  If you have labs (blood work) drawn today and your tests are completely normal, you will receive your results only by: Marland Kitchen MyChart Message (if you have MyChart) OR . A paper copy in the mail If you have any lab test that is abnormal or we need to change your treatment, we will call you to review the results.  Testing/Procedures: CT Angiography (CTA), is a special type of CT scan that uses a computer to produce multi-dimensional views of major blood vessels throughout the body. In CT angiography, a contrast material is injected through an IV to help visualize the blood vessels  Mainstays of therapy for aneurysms include good blood pressure control, healthy lifestyle, and avoiding fluoroquinolone antibiotic medications (such as those in the "Cipro" class, ending in "floxacin") due to risk of damage to the aorta. Since aneurysms can run in families, you should discuss your diagnosis with first degree relatives as they may need to be screened for this. Regular mild-moderate physical exercise is OK but avoid heavy lifting/weight lifting over 30lbs, chopping wood, shoveling snow or digging heavy earth with a shovel. Please refer to this flyer ([email or web address].pdf) for more information, keeping in mind of some of this information also pertains to people who have had surgery for this.  This has been scheduled for Monday 11/18 at 11:00 am. This will take place at [facility] location. Please arrive by 10:45. You cannot have anything to eat 4 hours prior, liquids only. If you have a conflict with this time you may call, [phone].   Follow-Up: At [facility], you and your health needs are  our priority.  As part of our continuing mission to provide you with exceptional heart care, we have created designated Provider Care Teams.  These Care Teams include your primary Cardiologist (physician) and Advanced Practice Providers (APPs -  Physician Assistants and Nurse Practitioners) who all work together to provide you with the care you need, when you need it. You will need a follow up appointment in 2 months. You may see Dr. Saunders Revel or one of the following Advanced Practice Providers on your designated Care Team:   Murray Hodgkins, NP Christell Faith, PA-C . Marrianne Mood, PA-C

## 2017-11-23 LAB — BASIC METABOLIC PANEL
BUN / CREAT RATIO: 13 (ref 10–24)
BUN: 12 mg/dL (ref 8–27)
CHLORIDE: 97 mmol/L (ref 96–106)
CO2: 24 mmol/L (ref 20–29)
Calcium: 9.1 mg/dL (ref 8.6–10.2)
Creatinine, Ser: 0.9 mg/dL (ref 0.76–1.27)
GFR calc Af Amer: 104 mL/min/{1.73_m2} (ref >59)
GFR, EST NON AFRICAN AMERICAN: 90 mL/min/{1.73_m2} (ref >59)
Glucose: 212 mg/dL — ABNORMAL HIGH (ref 65–99)
POTASSIUM: 4 mmol/L (ref 3.5–5.2)
Sodium: 138 mmol/L (ref 134–144)

## 2017-11-23 NOTE — Addendum Note (Signed)
Addended by: Alba Destine on: 11/23/2017 01:08 PM   Modules accepted: Orders

## 2017-11-29 ENCOUNTER — Ambulatory Visit: Payer: Medicare Other

## 2017-11-29 ENCOUNTER — Ambulatory Visit
Admission: RE | Admit: 2017-11-29 | Discharge: 2017-11-29 | Disposition: A | Discharge status: Home / Self Care | Payer: Medicare Other | Source: Ambulatory Visit | Attending: Physician Assistant | Admitting: Physician Assistant

## 2017-11-29 DIAGNOSIS — E279 Disorder of adrenal gland, unspecified: Secondary | ICD-10-CM | POA: Insufficient documentation

## 2017-11-29 DIAGNOSIS — I719 Aortic aneurysm of unspecified site, without rupture: Secondary | ICD-10-CM | POA: Diagnosis not present

## 2017-11-29 DIAGNOSIS — I712 Thoracic aortic aneurysm, without rupture: Secondary | ICD-10-CM | POA: Insufficient documentation

## 2017-11-29 DIAGNOSIS — R911 Solitary pulmonary nodule: Secondary | ICD-10-CM | POA: Insufficient documentation

## 2017-11-29 DIAGNOSIS — I313 Pericardial effusion (noninflammatory): Secondary | ICD-10-CM | POA: Insufficient documentation

## 2017-11-29 MED ORDER — IOPAMIDOL (ISOVUE-370) INJECTION 76%
75.0000 mL | Freq: Once | INTRAVENOUS | Status: AC | PRN
  Administered 2017-11-29: 75 mL via INTRAVENOUS

## 2017-12-03 ENCOUNTER — Telehealth: Payer: Self-pay | Admitting: *Deleted

## 2017-12-03 DIAGNOSIS — I313 Pericardial effusion (noninflammatory): Secondary | ICD-10-CM

## 2017-12-03 DIAGNOSIS — I3139 Other pericardial effusion (noninflammatory): Secondary | ICD-10-CM

## 2017-12-03 DIAGNOSIS — I714 Abdominal aortic aneurysm, without rupture, unspecified: Secondary | ICD-10-CM

## 2017-12-03 NOTE — Telephone Encounter (Signed)
-----   Message from Rise Mu, PA-C sent at 11/30/2017 10:31 AM EST ----- Stable 4.3 cm ascending thoracic aortic aneurysm. Continue annual follow up with CTA or MRA in 11/2018. Optimal BP and lipid control advised. Recent LDL in 10/2017 noted to be 73 with a goal of < 70. See if he is interested in changing to Crestor 40 mg daily with recheck fasting lipid and liver function in 2 months. His BP was improved on recheck in the office to 471 systolic. Have him keep an eye on his BP and if it consistently runs into the 595Z systolic let us know. He needs to quit smoking.   There was an incidental 5 mm pulmonary nodule noted in the left upper lone of the lung. This was not clearly visualized on his prior study in 06/2016. Given his smoking use, recommend follow up with the above recheck CTA chest in 12 months (per radiology recommendations).   Stable small pericardial effusion. Please schedule a follow up limited echo to further evaluate.   Coronary artery calcifications were noted. Smoking cessation and optimal BP/lipid control recommended.   Stable left adrenal fatty growth.

## 2017-12-03 NOTE — Telephone Encounter (Signed)
Results called to pt. Pt verbalized understanding of results and plan of care. He would like to see if the [place] can prescribe the Crestor because then he would not have to pay for it. He does not have that information handy at on the [place] location and phone numbers at this time. He will call us back Monday with the information so we can call about the Crestor.  States he can get f/u lab work through [place].  Transferred to scheduler to make appointment for limited echo. Patient verbalized understanding that he should stop smoking and keep blood pressure and cholesterol well controlled.  CT chest for 1 year entered.

## 2017-12-06 NOTE — Telephone Encounter (Signed)
[facility] calling back with [facility] information and has the issue with a new medication we gave him   Dr Virl Cagey Keplenger  [phone] Fax: [phone]   Pt c/o medication issue:  1. Name of Medication: isosorbide   2. How are you currently taking this medication (dosage and times per day)? 30 mg   3. Are you having a reaction (difficulty breathing--STAT)? no  4. What is your medication issue? Giving him really bad headaches, he states he's been taking It in the morning with food and that doesn't seem to help

## 2017-12-08 MED ORDER — ROSUVASTATIN CALCIUM 40 MG PO TABS: 40.0000 mg | ORAL_TABLET | Freq: Every day | ORAL | 3 refills | Status: DC

## 2017-12-08 NOTE — Telephone Encounter (Signed)
I spoke with the patient. He has been on imdur 30 mg once daily since 11/22/17 per Christell Faith, PA. He has tried taking this in the morning with food and at night, but is having terrible headaches with this. I inquired if he has tried tylenol and he confirms he cannot take this due to GI upset.  I have advised I will review with Thurmond Butts, PA and call him back with further recommendations.  He is aware I will fax his RX for crestor 40 mg once daily to the [place] to see if they will pay for this.

## 2017-12-08 NOTE — Telephone Encounter (Signed)
OK to stop Imdur. Please let us know if BP trends > 130/80.

## 2017-12-08 NOTE — Telephone Encounter (Signed)
I spoke with the patient. He is aware of Thurmond Butts, PA's recommendations to stop imdur and call if his BP is consistently > 130/80.  Have made multiple attempts to fax Crestor to the [place], but these have not gone through. Will reattempt on Monday 12/13/17.

## 2017-12-13 DIAGNOSIS — H2513 Age-related nuclear cataract, bilateral: Secondary | ICD-10-CM | POA: Diagnosis not present

## 2017-12-13 LAB — HM DIABETES EYE EXAM

## 2017-12-13 NOTE — Telephone Encounter (Signed)
Attempted to re-fax prescription today as well. Again, it was unsuccessful. Will need to call and check with patient on fax number.

## 2017-12-14 NOTE — Telephone Encounter (Signed)
Let patient know that the fax number he provided is not going through and asked if he could call to verify that number. He will check on the number and give [place] a call back with the number so we can fax the prescription.

## 2017-12-27 ENCOUNTER — Ambulatory Visit (INDEPENDENT_AMBULATORY_CARE_PROVIDER_SITE_OTHER): Payer: Medicare Other

## 2017-12-27 ENCOUNTER — Other Ambulatory Visit: Payer: Self-pay

## 2017-12-27 DIAGNOSIS — I313 Pericardial effusion (noninflammatory): Secondary | ICD-10-CM

## 2017-12-27 DIAGNOSIS — I3139 Other pericardial effusion (noninflammatory): Secondary | ICD-10-CM

## 2018-01-13 ENCOUNTER — Other Ambulatory Visit: Payer: Self-pay | Admitting: Family Medicine

## 2018-01-13 NOTE — Telephone Encounter (Signed)
Copied from [place] [id number]. Topic: Quick Communication - Rx Refill/Question >> Jan 13, 2018  3:04 PM Windy Kalata wrote: Medication: sitaGLIPtin (JANUVIA) 100 MG tablet   Has the patient contacted their pharmacy? No. (Agent: If no, request that the patient contact the pharmacy for the refill.) (Agent: If yes, when and what did the pharmacy advise?)  Preferred Pharmacy (with phone number or street name): [id number] [address], [place] [phone] (Phone) [phone] (Fax)    Agent: Please be advised that RX refills may take up to 3 business days. We ask that you follow-up with your pharmacy.

## 2018-01-14 MED ORDER — SITAGLIPTIN PHOSPHATE 100 MG PO TABS: 100.0000 mg | ORAL_TABLET | Freq: Every day | ORAL | 0 refills | Status: DC

## 2018-01-14 NOTE — Telephone Encounter (Signed)
Requested Prescriptions  Pending Prescriptions Disp Refills  . sitaGLIPtin (JANUVIA) 100 MG tablet 90 tablet 0    Sig: Take 1 tablet (100 mg total) by mouth daily.     Endocrinology:  Diabetes - DPP-4 Inhibitors Passed - 01/14/2018  7:19 AM      Passed - HBA1C is between 0 and 7.9 and within 180 days    Hgb A1c MFr Bld  Date Value Ref Range Status  10/18/2017 6.6 (H) 4.6 - 6.5 % Final    Comment:    Glycemic Control Guidelines for People with Diabetes:Non Diabetic:  <6%Goal of Therapy: <7%Additional Action Suggested:  >8%          Passed - Cr in normal range and within 360 days    Creatinine, Ser  Date Value Ref Range Status  11/22/2017 0.90 0.76 - 1.27 mg/dL Final         Passed - Valid encounter within last 6 months    Recent Outpatient Visits          2 months ago Needs flu shot   [facility] [place], Angela Adam, MD   6 months ago Type 2 diabetes mellitus without complication, without long-term current use of insulin [facility])   [place] Leone Haven, MD   8 months ago Rib pain on left side   [facility] Leone Haven, MD   9 months ago Type 2 diabetes mellitus without complication, without long-term current use of insulin [facility])   [place], Angela Adam, MD   1 year ago History of colon [place], Angela Adam, MD      Future Appointments            In 1 month [place], Angela Adam, MD [place], [place]   In 3 months O'Brien-Blaney, Bryson Corona, LPN [place], [place]

## 2018-01-24 ENCOUNTER — Encounter: Payer: Self-pay | Admitting: Internal Medicine

## 2018-01-24 ENCOUNTER — Ambulatory Visit: Payer: Medicare Other | Admitting: Internal Medicine

## 2018-01-24 VITALS — BP 142/80 | HR 84 | Ht 70.0 in | Wt 220.0 lb

## 2018-01-24 DIAGNOSIS — I712 Thoracic aortic aneurysm, without rupture, unspecified: Secondary | ICD-10-CM | POA: Insufficient documentation

## 2018-01-24 DIAGNOSIS — Z87898 Personal history of other specified conditions: Secondary | ICD-10-CM | POA: Diagnosis not present

## 2018-01-24 DIAGNOSIS — Z720 Tobacco use: Secondary | ICD-10-CM

## 2018-01-24 DIAGNOSIS — I10 Essential (primary) hypertension: Secondary | ICD-10-CM | POA: Diagnosis not present

## 2018-01-24 DIAGNOSIS — I313 Pericardial effusion (noninflammatory): Secondary | ICD-10-CM

## 2018-01-24 DIAGNOSIS — I3139 Other pericardial effusion (noninflammatory): Secondary | ICD-10-CM

## 2018-01-24 DIAGNOSIS — I25118 Atherosclerotic heart disease of native coronary artery with other forms of angina pectoris: Secondary | ICD-10-CM

## 2018-01-24 DIAGNOSIS — E785 Hyperlipidemia, unspecified: Secondary | ICD-10-CM

## 2018-01-24 MED ORDER — VARENICLINE TARTRATE 0.5 MG X 11 & 1 MG X 42 PO MISC: ORAL | 0 refills | Status: DC

## 2018-01-24 MED ORDER — VARENICLINE TARTRATE 1 MG PO TABS: 1.0000 mg | ORAL_TABLET | Freq: Two times a day (BID) | ORAL | 1 refills | Status: DC

## 2018-01-24 MED ORDER — AMLODIPINE BESYLATE 2.5 MG PO TABS: 2.5000 mg | ORAL_TABLET | Freq: Every day | ORAL | 2 refills | Status: DC

## 2018-01-24 NOTE — Progress Notes (Signed)
Follow-up Outpatient Visit Date: 01/24/2018  Primary Care Provider: Leone Haven, MD [address] [place] [address]  Chief Complaint: Chest pain  HPI:  Mr. Plemons is a 63 y.o. year-old male with history of coronary artery disease status post PCI's to the LCx and RCA in 2014, hypertension, hyperlipidemia, type 2 diabetes mellitus, depression, kidney stones, and fall with intracranial hemorrhage, who presents for follow-up of CAD and thoracic aortic aneurysm.  He was last seen in our office in 11/2017 by Christell Faith, PA.  At that time, he reported continued sharp substernal chest pain present ever since his PCI's in 2014.  CTA of the chest was recommended to follow-up his thoracic aortic aneurysm, which subsequently showed stable mild enlargement of the a sending aorta (4.3 cm).  Trivial pericardial effusion was noted, which was redemonstrated on subsequent limited echo.  Ischemia testing has been deferred thus far.  Today, Mr. Nooney reports feeling about the same, he still has intermittent chest tightness usually lasting about 30 to 40 minutes.  This has been present for at least a year and happens once or twice a week.  It is not exertional.  He has not had any improvement with nitroglycerin.  He was started on isosorbide mononitrate at his last visit in November, though he did not tolerate this due to severe headache.  He otherwise reports being compliant with his medications.  He has not had palpitations, lightheadedness/syncope, shortness of breath, or edema.  --------------------------------------------------------------------------------------------------  Cardiovascular History & Procedures: Cardiovascular Problems:  Syncope  Coronary artery disease status post PCI  Risk Factors:  Known coronary artery disease, hypertension, hyperlipidemia, diabetes mellitus, male gender, age greater than 44, and tobacco use  Cath/PCI:  Staged PCI's of the [place]  (08/2012 and 09/2012) with drug-eluting stent placement to the mid LCx followed by the mid/distal RCA.  CV Surgery:  None  EP Procedures and Devices:  30-day event monitor (05/08/16): Sinus rhythm with first-degree AV block. No significant arrhythmias or pauses.  Non-Invasive Evaluation(s):  CTA chest (11/29/17): Stable 4.3 cm ascending thoracic aortic aneurysm.  5 mm left upper lobe nodule; recommend follow-up CT in 12 months.  Coronary artery calcification.  Stable small pericardial effusion and left adrenal myolipoma.  TTE (05/18/16): Normal LV size and contraction. LVEF 60-65% with normal wall motion. Grade 1 diastolic dysfunction. Mild aortic valve calcification and mildly dilated aortic root, measuring up to 4.2 cm. Normal RV size and function. Normal pulmonary artery pressure.  Pharmacologic MPI (05/15/16): Low risk study with small in size, mild in severity, fixed basal anterolateral defect. No ischemia. LVEF 55-65%.  Recent CV Pertinent Labs: Lab Results  Component Value Date   CHOL 135 10/18/2017   HDL 34.50 (L) 10/18/2017   LDLCALC 73 10/18/2017   LDLDIRECT 78.0 08/27/2016   TRIG 139.0 10/18/2017   CHOLHDL 4 10/18/2017   K 4.0 11/22/2017   BUN 12 11/22/2017   CREATININE 0.90 11/22/2017    Past medical and surgical history were reviewed and updated in EPIC.  Current Meds  Medication Sig  . aspirin 81 MG tablet Take 81 mg by mouth daily.  . chlorthalidone (HYGROTON) 50 MG tablet Take 25 mg by mouth daily.  . cholecalciferol (VITAMIN D) 1000 UNITS tablet Take 1 tablet (1,000 Units total) by mouth QID.  . DULoxetine (CYMBALTA) 20 MG capsule Take 20 mg by mouth daily.  . fenofibrate (TRICOR) 145 MG tablet Take 1 tablet (145 mg total) by mouth daily.  Marland Kitchen lidocaine (LIDODERM)  5 % Place 1 patch onto the skin daily. Remove & Discard patch within 12 hours or as directed by MD  . losartan (COZAAR) 25 MG tablet Take 25 mg by mouth daily.  . metFORMIN (GLUCOPHAGE) 1000 MG tablet  Take 1,000 mg by mouth 2 (two) times daily with a meal.  . methocarbamol (ROBAXIN) 500 MG tablet Take 500 mg by mouth as directed.  . methyl salicylate liquid Apply 1 application topically as needed for muscle pain.  . nitroGLYCERIN (NITROSTAT) 0.4 MG SL tablet Place 1 tablet (0.4 mg total) under the tongue every 5 (five) minutes as needed for chest pain. May take up to 3 doses.  . polyvinyl alcohol (LIQUIFILM TEARS) 1.4 % ophthalmic solution 1 drop as needed for dry eyes.  . potassium chloride SA (K-DUR,KLOR-CON) 20 MEQ tablet Take 1 tablet (20 mEq total) by mouth daily.  . pregabalin (LYRICA) 50 MG capsule Take 50 mg by mouth 3 (three) times daily.  . rosuvastatin (CRESTOR) 40 MG tablet Take 1 tablet (40 mg total) by mouth daily.  . sitaGLIPtin (JANUVIA) 100 MG tablet Take 1 tablet (100 mg total) by mouth daily.  . traMADol (ULTRAM) 50 MG tablet Take 2 tablets (100 mg total) by mouth every 8 (eight) hours as needed for moderate pain.  . vitamin B-12 (CYANOCOBALAMIN) 1000 MCG tablet Take 1 tablet (1,000 mcg total) by mouth daily.    Allergies: Gabapentin; Isosorbide mononitrate er [isosorbide dinitrate]; and Tylenol [acetaminophen]  Social History   Tobacco Use  . Smoking status: Current Every Day Smoker    Packs/day: 1.50    Years: 50.00    Pack years: 75.00    Types: Cigarettes  . Smokeless tobacco: Never Used  Substance Use Topics  . Alcohol use: No  . Drug use: No    Family History  Problem Relation Age of Onset  . Diabetes Mother   . Heart disease Father   . Diabetes Father   . Diabetes Sister   . Heart disease Sister   . Diabetes Brother   . Heart disease Brother   . Heart disease Paternal Grandfather     Review of Systems: A 12-system review of systems was performed and was negative except as noted in the HPI.  --------------------------------------------------------------------------------------------------  Physical Exam: BP (!) 142/80 (BP Location: Left Arm,  Patient Position: Sitting, Cuff Size: Normal)   Pulse 84   Ht 5\' 10"  (1.778 m)   Wt 220 lb (99.8 kg)   BMI 31.57 kg/m  Repeat BP 128/68  General: NAD. HEENT: No conjunctival pallor or scleral icterus. Moist mucous membranes.  OP clear. Neck: Supple without lymphadenopathy, thyromegaly, JVD, or HJR. Lungs: Normal work of breathing. Clear to auscultation bilaterally without wheezes or crackles. Heart: Regular rate and rhythm with 1/6 systolic murmur.  No rubs or gallops.  Nondisplaced PMI. Abd: Bowel sounds present. Soft, NT/ND without hepatosplenomegaly Ext: No lower extremity edema. Radial, PT, and DP pulses are 2+ bilaterally. Skin: Warm and dry without rash.  EKG: Normal sinus rhythm with occasional PACs.  Otherwise, no significant abnormality.  Lab Results  Component Value Date   WBC 7.7 04/20/2016   HGB 14.3 04/20/2016   HCT 41.6 04/20/2016   MCV 82.5 04/20/2016   PLT 193.0 04/20/2016    Lab Results  Component Value Date   NA 138 11/22/2017   K 4.0 11/22/2017   CL 97 11/22/2017   CO2 24 11/22/2017   BUN 12 11/22/2017   CREATININE 0.90 11/22/2017   GLUCOSE 212 (  H) 11/22/2017   ALT 12 10/18/2017    Lab Results  Component Value Date   CHOL 135 10/18/2017   HDL 34.50 (L) 10/18/2017   LDLCALC 73 10/18/2017   LDLDIRECT 78.0 08/27/2016   TRIG 139.0 10/18/2017   CHOLHDL 4 10/18/2017    --------------------------------------------------------------------------------------------------  ASSESSMENT AND PLAN: Coronary artery disease with stable angina Mr. Kant reports chest pain for at least 1 year that is anginal in quality but interestingly not exertional or relieved by nitroglycerin.  He was intolerant of isosorbide mononitrate.  Most recent ischemia evaluation with myocardial perfusion stress test in 05/2016 was low risk.  We have discussed continued medical therapy versus cardiac catheterization.  Mr. Gougeon would like to defer invasive procedures, unless symptoms  worsen.  As he did not tolerate isosorbide mononitrate, we have agreed to add amlodipine 2.5 mg daily.  We will continue secondary prevention with aspirin and rosuvastatin.  I am not prescribing a beta-blocker given concern for this as an etiology of syncope in the past.  Thoracic aortic aneurysm Stable in size.  Continue BP and lipid control as well as annual imaging.  Pericardial effusion Trivial effusion noted on recently echo.  I doubt this is related to his chronic chest pain.  We will defer further evaluation.  History of syncope No recurrence for more than 1 year.  We will continue to avoid beta-blockers.  Hypertension Initial blood pressure today mildly elevated, though blood pressure normal on repeat.  We will add amlodipine 2.5 mg daily for blood pressure control and antianginal therapy.  Hyperlipidemia Most recent lipid panel in October showed LDL just above goal at 73.  We will continue with rosuvastatin 40 mg daily.  Repeat lipid panel should be considered when he follows up with his PCP next month.  Tobacco abuse Mr. Begley continues to smoke but is interested in quitting.  He has had success in the past with bupropion but notes that adding this would likely necessitate discontinuation of duloxetine.  We have agreed to a trial of Chantix.  I counseled him on potential side effects and risks.  Follow-up: Return to clinic in 3 months.  Nelva Bush, MD 01/24/2018 10:23 AM

## 2018-01-24 NOTE — Patient Instructions (Addendum)
Medication Instructions:  Your physician has recommended you make the following change in your medication:  1- OK TO STOP Imdur. 2- START Amlodipine 2.5 mg (1 tablet) by mouth once a day.  3- CHANTIX for 3 months to help stop smoking. Start with starter pack.   If you need a refill on your cardiac medications before your next appointment, please call your pharmacy.   Lab work: none If you have labs (blood work) drawn today and your tests are completely normal, you will receive your results only by: Marland Kitchen MyChart Message (if you have MyChart) OR . A paper copy in the mail If you have any lab test that is abnormal or we need to change your treatment, we will call you to review the results.  Testing/Procedures: none  Follow-Up: At [facility], you and your health needs are our priority.  As part of our continuing mission to provide you with exceptional heart care, we have created designated Provider Care Teams.  These Care Teams include your primary Cardiologist (physician) and Advanced Practice Providers (APPs -  Physician Assistants and Nurse Practitioners) who all work together to provide you with the care you need, when you need it. You will need a follow up appointment in 3 months.  Please call our office 2 months in advance to schedule this appointment.  You may see DR Harrell Gave END or one of the following Advanced Practice Providers on your designated Care Team:   Christell Faith, PA-C   Varenicline oral tablets What is this medicine? VARENICLINE (var EN i kleen) is used to help people quit smoking. It is used with a patient support program recommended by your physician. This medicine may be used for other purposes; ask your health care provider or pharmacist if you have questions. COMMON BRAND NAME(S): Chantix What should I tell my health care provider before I take this medicine? They need to know if you have any of these conditions: -heart disease -if you often drink  alcohol -kidney disease -mental illness -on hemodialysis -seizures -history of stroke -suicidal thoughts, plans, or attempt; a previous suicide attempt by you or a family member -an unusual or allergic reaction to varenicline, other medicines, foods, dyes, or preservatives -pregnant or trying to get pregnant -breast-feeding How should I use this medicine? Take this medicine by mouth after eating. Take with a full glass of water. Follow the directions on the prescription label. Take your doses at regular intervals. Do not take your medicine more often than directed. There are 3 ways you can use this medicine to help you quit smoking; talk to your health care professional to decide which plan is right for you: 1) you can choose a quit date and start this medicine 1 week before the quit date, or, 2) you can start taking this medicine before you choose a quit date, and then pick a quit date between day 8 and 35 days of treatment, or, 3) if you are not sure that you are able or willing to quit smoking right away, start taking this medicine and slowly decrease the amount you smoke as directed by your health care professional with the goal of being cigarette-free by week 12 of treatment. Stick to your plan; ask about support groups or other ways to help you remain cigarette-free. If you are motivated to quit smoking and did not succeed during a previous attempt with this medicine for reasons other than side effects, or if you returned to smoking after this treatment, speak with your  health care professional about whether another course of this medicine may be right for you. A special MedGuide will be given to you by the pharmacist with each prescription and refill. Be sure to read this information carefully each time. Talk to your pediatrician regarding the use of this medicine in children. This medicine is not approved for use in children. Overdosage: If you think you have taken too much of this  medicine contact a poison control center or emergency room at once. NOTE: This medicine is only for you. Do not share this medicine with others. What if I miss a dose? If you miss a dose, take it as soon as you can. If it is almost time for your next dose, take only that dose. Do not take double or extra doses. What may interact with this medicine? -alcohol -insulin -other medicines used to help people quit smoking -theophylline -warfarin This list may not describe all possible interactions. Give your health care provider a list of all the medicines, herbs, non-prescription drugs, or dietary supplements you use. Also tell them if you smoke, drink alcohol, or use illegal drugs. Some items may interact with your medicine. What should I watch for while using this medicine? It is okay if you do not succeed at your attempt to quit and have a cigarette. You can still continue your quit attempt and keep using this medicine as directed. Just throw away your cigarettes and get back to your quit plan. Talk to your health care provider before using other treatments to quit smoking. Using this medicine with other treatments to quit smoking may increase the risk for side effects compared to using a treatment alone. You may get drowsy or dizzy. Do not drive, use machinery, or do anything that needs mental alertness until you know how this medicine affects you. Do not stand or sit up quickly, especially if you are an older patient. This reduces the risk of dizzy or fainting spells. Decrease the number of alcoholic beverages that you drink during treatment with this medicine until you know if this medicine affects your ability to tolerate alcohol. Some people have experienced increased drunkenness (intoxication), unusual or sometimes aggressive behavior, or no memory of things that have happened (amnesia) during treatment with this medicine. Sleepwalking can happen during treatment with this medicine, and can  sometimes lead to behavior that is harmful to you, other people, or property. Stop taking this medicine and tell your doctor if you start sleepwalking or have other unusual sleep-related activity. After taking this medicine, you may get up out of bed and do an activity that you do not know you are doing. The next morning, you may have no memory of this. Activities include driving a car ("sleep-driving"), making and eating food, talking on the phone, sexual activity, and sleep-walking. Serious injuries have occurred. Stop the medicine and call your doctor right away if you find out you have done any of these activities. Do not take this medicine if you have used alcohol that evening. Do not take it if you have taken another medicine for sleep. The risk of doing these sleep-related activities is higher. Patients and their families should watch out for new or worsening depression or thoughts of suicide. Also watch out for sudden changes in feelings such as feeling anxious, agitated, panicky, irritable, hostile, aggressive, impulsive, severely restless, overly excited and hyperactive, or not being able to sleep. If this happens, call your health care professional. If you have diabetes and you quit smoking,  the effects of insulin may be increased and you may need to reduce your insulin dose. Check with your doctor or health care professional about how you should adjust your insulin dose. What side effects may I notice from receiving this medicine? Side effects that you should report to your doctor or health care professional as soon as possible: -allergic reactions like skin rash, itching or hives, swelling of the face, lips, tongue, or throat -acting aggressive, being angry or violent, or acting on dangerous impulses -breathing problems -changes in emotions or moods -chest pain or chest tightness -feeling faint or lightheaded, falls -hallucination, loss of contact with reality -mouth sores -redness,  blistering, peeling or loosening of the skin, including inside the mouth -signs and symptoms of a stroke like changes in vision; confusion; trouble speaking or understanding; severe headaches; sudden numbness or weakness of the face, arm or leg; trouble walking; dizziness; loss of balance or coordination -seizures -sleepwalking -suicidal thoughts or other mood changes Side effects that usually do not require medical attention (report to your doctor or health care professional if they continue or are bothersome): -constipation -gas -headache -nausea, vomiting -strange dreams -trouble sleeping This list may not describe all possible side effects. Call your doctor for medical advice about side effects. You may report side effects to FDA at 1-800-FDA-1088. Where should I keep my medicine? Keep out of the reach of children. Store at room temperature between 15 and 30 degrees C (59 and 86 degrees F). Throw away any unused medicine after the expiration date. NOTE: This sheet is a summary. It may not cover all possible information. If you have questions about this medicine, talk to your doctor, pharmacist, or health care provider.  2019 Elsevier/Gold Standard (2017-06-25 12:48:08)

## 2018-02-11 DIAGNOSIS — H9319 Tinnitus, unspecified ear: Secondary | ICD-10-CM | POA: Diagnosis not present

## 2018-02-11 DIAGNOSIS — H903 Sensorineural hearing loss, bilateral: Secondary | ICD-10-CM | POA: Diagnosis not present

## 2018-02-15 ENCOUNTER — Other Ambulatory Visit: Payer: Self-pay | Admitting: Internal Medicine

## 2018-02-22 ENCOUNTER — Ambulatory Visit (INDEPENDENT_AMBULATORY_CARE_PROVIDER_SITE_OTHER): Payer: Medicare Other | Admitting: Family Medicine

## 2018-02-22 ENCOUNTER — Encounter: Payer: Self-pay | Admitting: Family Medicine

## 2018-02-22 VITALS — BP 120/68 | HR 81 | Temp 98.0°F | Ht 70.0 in | Wt 228.8 lb

## 2018-02-22 DIAGNOSIS — E119 Type 2 diabetes mellitus without complications: Secondary | ICD-10-CM | POA: Diagnosis not present

## 2018-02-22 DIAGNOSIS — E041 Nontoxic single thyroid nodule: Secondary | ICD-10-CM

## 2018-02-22 DIAGNOSIS — I10 Essential (primary) hypertension: Secondary | ICD-10-CM | POA: Diagnosis not present

## 2018-02-22 DIAGNOSIS — H2512 Age-related nuclear cataract, left eye: Secondary | ICD-10-CM | POA: Diagnosis not present

## 2018-02-22 DIAGNOSIS — G8929 Other chronic pain: Secondary | ICD-10-CM

## 2018-02-22 DIAGNOSIS — Z720 Tobacco use: Secondary | ICD-10-CM

## 2018-02-22 DIAGNOSIS — M545 Low back pain, unspecified: Secondary | ICD-10-CM

## 2018-02-22 DIAGNOSIS — R911 Solitary pulmonary nodule: Secondary | ICD-10-CM | POA: Insufficient documentation

## 2018-02-22 LAB — POCT GLYCOSYLATED HEMOGLOBIN (HGB A1C): Hemoglobin A1C: 6.9 % — AB (ref 4.0–5.6)

## 2018-02-22 MED ORDER — GLUCOSE BLOOD VI STRP: ORAL_STRIP | 12 refills | Status: AC

## 2018-02-22 MED ORDER — TRAMADOL HCL 50 MG PO TABS: 100.0000 mg | ORAL_TABLET | Freq: Three times a day (TID) | ORAL | 1 refills | Status: DC | PRN

## 2018-02-22 NOTE — Assessment & Plan Note (Signed)
I encouraged the patient to schedule follow-up with ENT.

## 2018-02-22 NOTE — Patient Instructions (Signed)
Nice to see you. We will check your A1c today.  Please continue your current medications.

## 2018-02-22 NOTE — Assessment & Plan Note (Signed)
Control is worsened.  We will check an A1c.  Consider alteration of his regimen once this returns.

## 2018-02-22 NOTE — Assessment & Plan Note (Signed)
Congratulated on cessation.  Encouraged continued cessation of smoking.

## 2018-02-22 NOTE — Assessment & Plan Note (Signed)
Well-controlled.  He will continue his current regimen.  He will continue to follow with cardiology.  His cardiovascular status has been stable.

## 2018-02-22 NOTE — Progress Notes (Signed)
Tommi Rumps, MD Phone: [phone]  Walter Salas is a 63 y.o. male who presents today for f/u.  CC: Diabetes, chronic back pain, hypertension, tobacco abuse, lung nodule  Diabetes: Typically 150s-179.  Taking metformin, Januvia.  No polyuria or polydipsia.  No hypoglycemia.  Chronic back pain: Patient status post fusion in his lumbar spine.  He follows with a specialist at the [facility] in [place].  He notes they advised him there is nothing else they can do for this.  He does take the tramadol intermittently which does help with his pain.  No drowsiness or alcohol intake.  He does note the back pain is somewhat worse with the weather changes.  No radiation.  No numbness or weakness.  No incontinence.  Hypertension: Typically 120/60-70.  Taking amlodipine, chlorthalidone, and losartan.  He has chronic stable centralized chest discomfort that occurs intermittently without exertion.  This has been going on for a long time.  He has discussed this with cardiology.  Most recent stress test was low risk in 2018.  They discussed continued medical management versus cardiac catheterization and they opted for medical management.  Amlodipine was added for this.  He continues on aspirin and Crestor.  Tobacco abuse: Patient quit smoking about a week ago.  He has been on Chantix.  He notes this has been helpful.  Prior CT scan in November revealed a lung nodule.  Recommended 1 year follow-up given his smoking history.  He does note some coughing up of dark mucus that has occurred since he quit smoking.  No blood or black mucus.  Has been going on for 2 to 3 days.  He notes this has happened previously when he would quit smoking.  Thyroid nodule: This has been evaluated by ENT.  He has not seen them in greater than 1 year  Social History   Tobacco Use  Smoking Status Former Smoker  . Packs/day: 1.50  . Years: 50.00  . Pack years: 75.00  . Types: Cigarettes  . Last attempt to quit: 02/15/2018  . Years since  quitting: 0.0  Smokeless Tobacco Never Used     ROS see history of present illness  Objective  Physical Exam Vitals:   02/22/18 0856  BP: 120/68  Pulse: 81  Temp: 98 F (36.7 C)  SpO2: 95%    BP Readings from Last 3 Encounters:  02/22/18 120/68  01/24/18 (!) 142/80  11/22/17 130/72   Wt Readings from Last 3 Encounters:  02/22/18 228 lb 12.8 oz (103.8 kg)  01/24/18 220 lb (99.8 kg)  11/22/17 224 lb (101.6 kg)    Physical Exam Constitutional:      General: He is not in acute distress.    Appearance: He is not diaphoretic.  Cardiovascular:     Rate and Rhythm: Normal rate and regular rhythm.     Heart sounds: Normal heart sounds.  Pulmonary:     Effort: Pulmonary effort is normal.     Breath sounds: Normal breath sounds.  Musculoskeletal:     Comments: No midline spine tenderness, no midline spine step-off, slight lumbar muscular back tenderness  Skin:    General: Skin is warm and dry.  Neurological:     Mental Status: He is alert.     Comments: 5/5 strength bilateral quads, hamstrings, plantar flexion, and dorsiflexion, sensation light touch intact bilateral lower extremities      Assessment/Plan: Please see individual problem list.  Essential hypertension Well-controlled.  He will continue his current regimen.  He will  continue to follow with cardiology.  His cardiovascular status has been stable.  Thyroid nodule I encouraged the patient to schedule follow-up with ENT.  Diabetes mellitus, type 2 ([place]) Control is worsened.  We will check an A1c.  Consider alteration of his regimen once this returns.  Chronic back pain Chronic issue.  Worsens with the weather.  He will continue tramadol as needed.  Controlled substance database reviewed.  Lung nodule Seen on CT scan.  Due for recheck November 2020.  Patient informed of this.  Tobacco abuse Congratulated on cessation.  Encouraged continued cessation of smoking.   Orders Placed This Encounter    Procedures  . POCT HgB A1C    Meds ordered this encounter  Medications  . traMADol (ULTRAM) 50 MG tablet    Sig: Take 2 tablets (100 mg total) by mouth every 8 (eight) hours as needed for moderate pain.    Dispense:  90 tablet    Refill:  1    Not to exceed 5 additional fills before 11/11/2015.  . glucose blood test strip    Sig: Check once daily, E11.9    Dispense:  100 each    Refill:  [place], MD [place]

## 2018-02-22 NOTE — Assessment & Plan Note (Signed)
Seen on CT scan.  Due for recheck November 2020.  Patient informed of this.

## 2018-02-22 NOTE — Assessment & Plan Note (Signed)
Chronic issue.  Worsens with the weather.  He will continue tramadol as needed.  Controlled substance database reviewed.

## 2018-02-28 ENCOUNTER — Encounter: Payer: Self-pay | Admitting: *Deleted

## 2018-02-28 DIAGNOSIS — H905 Unspecified sensorineural hearing loss: Secondary | ICD-10-CM | POA: Diagnosis not present

## 2018-03-08 ENCOUNTER — Encounter: Payer: Self-pay | Admitting: *Deleted

## 2018-03-08 ENCOUNTER — Ambulatory Visit: Payer: Medicare Other | Admitting: Anesthesiology

## 2018-03-08 ENCOUNTER — Ambulatory Visit
Admission: RE | Admit: 2018-03-08 | Discharge: 2018-03-08 | Disposition: A | Discharge status: Home / Self Care | Payer: Medicare Other | Attending: Ophthalmology | Admitting: Ophthalmology

## 2018-03-08 ENCOUNTER — Other Ambulatory Visit: Payer: Self-pay

## 2018-03-08 ENCOUNTER — Encounter
Admission: RE | Disposition: A | Discharge status: Home / Self Care | Payer: Self-pay | Source: Home / Self Care | Attending: Ophthalmology

## 2018-03-08 DIAGNOSIS — Z7729 Contact with and (suspected ) exposure to other hazardous substances: Secondary | ICD-10-CM | POA: Insufficient documentation

## 2018-03-08 DIAGNOSIS — I509 Heart failure, unspecified: Secondary | ICD-10-CM | POA: Diagnosis not present

## 2018-03-08 DIAGNOSIS — H2512 Age-related nuclear cataract, left eye: Secondary | ICD-10-CM | POA: Insufficient documentation

## 2018-03-08 DIAGNOSIS — Z950 Presence of cardiac pacemaker: Secondary | ICD-10-CM | POA: Diagnosis not present

## 2018-03-08 DIAGNOSIS — E119 Type 2 diabetes mellitus without complications: Secondary | ICD-10-CM | POA: Diagnosis not present

## 2018-03-08 DIAGNOSIS — I252 Old myocardial infarction: Secondary | ICD-10-CM | POA: Diagnosis not present

## 2018-03-08 DIAGNOSIS — J449 Chronic obstructive pulmonary disease, unspecified: Secondary | ICD-10-CM | POA: Insufficient documentation

## 2018-03-08 DIAGNOSIS — N289 Disorder of kidney and ureter, unspecified: Secondary | ICD-10-CM | POA: Insufficient documentation

## 2018-03-08 DIAGNOSIS — I110 Hypertensive heart disease with heart failure: Secondary | ICD-10-CM | POA: Insufficient documentation

## 2018-03-08 DIAGNOSIS — Z955 Presence of coronary angioplasty implant and graft: Secondary | ICD-10-CM | POA: Insufficient documentation

## 2018-03-08 DIAGNOSIS — E1136 Type 2 diabetes mellitus with diabetic cataract: Secondary | ICD-10-CM | POA: Insufficient documentation

## 2018-03-08 HISTORY — DX: Personal history of urinary calculi: Z87.442

## 2018-03-08 HISTORY — DX: Acute myocardial infarction, unspecified: I21.9

## 2018-03-08 HISTORY — DX: Chronic cough: R05.3

## 2018-03-08 HISTORY — DX: Sleep apnea, unspecified: G47.30

## 2018-03-08 HISTORY — DX: Unspecified hearing loss, unspecified ear: H91.90

## 2018-03-08 HISTORY — DX: Malignant (primary) neoplasm, unspecified: C80.1

## 2018-03-08 HISTORY — DX: Diverticulosis of intestine, part unspecified, without perforation or abscess without bleeding: K57.90

## 2018-03-08 HISTORY — DX: Cough: R05

## 2018-03-08 HISTORY — DX: Personal history of peptic ulcer disease: Z87.11

## 2018-03-08 HISTORY — DX: Contact with and (suspected) exposure to other war theater: Z77.39

## 2018-03-08 HISTORY — PX: CATARACT EXTRACTION W/PHACO: SHX586

## 2018-03-08 HISTORY — DX: Angina pectoris, unspecified: I20.9

## 2018-03-08 HISTORY — DX: Contact with and (suspected) exposure to other hazardous, chiefly nonmedicinal, chemicals: Z77.098

## 2018-03-08 HISTORY — DX: Personal history of other diseases of the digestive system: Z87.19

## 2018-03-08 LAB — GLUCOSE, CAPILLARY: Glucose-Capillary: 241 mg/dL — ABNORMAL HIGH (ref 70–99)

## 2018-03-08 SURGERY — PHACOEMULSIFICATION, CATARACT, WITH IOL INSERTION
Anesthesia: Monitor Anesthesia Care | Site: Eye | Laterality: Left

## 2018-03-08 MED ORDER — TETRACAINE HCL 0.5 % OP SOLN
OPHTHALMIC | Status: AC
  Administered 2018-03-08: 1 [drp] via OPHTHALMIC
  Filled 2018-03-08: qty 4

## 2018-03-08 MED ORDER — FENTANYL CITRATE (PF) 100 MCG/2ML IJ SOLN
INTRAMUSCULAR | Status: AC
  Filled 2018-03-08: qty 2

## 2018-03-08 MED ORDER — MIDAZOLAM HCL 2 MG/2ML IJ SOLN
INTRAMUSCULAR | Status: DC | PRN
  Administered 2018-03-08: 1 mg via INTRAVENOUS

## 2018-03-08 MED ORDER — LIDOCAINE HCL (PF) 4 % IJ SOLN
INTRAOCULAR | Status: DC | PRN
  Administered 2018-03-08: 2 mL via OPHTHALMIC

## 2018-03-08 MED ORDER — ARMC OPHTHALMIC DILATING DROPS
OPHTHALMIC | Status: AC
  Administered 2018-03-08: 1 via OPHTHALMIC
  Filled 2018-03-08: qty 0.5

## 2018-03-08 MED ORDER — FENTANYL CITRATE (PF) 100 MCG/2ML IJ SOLN: 25.0000 ug | INTRAMUSCULAR | Status: DC | PRN

## 2018-03-08 MED ORDER — MIDAZOLAM HCL 2 MG/2ML IJ SOLN
INTRAMUSCULAR | Status: AC
  Filled 2018-03-08: qty 2

## 2018-03-08 MED ORDER — SODIUM CHLORIDE 0.9 % IV SOLN
INTRAVENOUS | Status: DC
  Administered 2018-03-08: 10:00:00 via INTRAVENOUS

## 2018-03-08 MED ORDER — CARBACHOL 0.01 % IO SOLN
INTRAOCULAR | Status: DC | PRN
  Administered 2018-03-08: .5 mL via INTRAOCULAR

## 2018-03-08 MED ORDER — MOXIFLOXACIN HCL 0.5 % OP SOLN: 1.0000 [drp] | OPHTHALMIC | Status: DC | PRN

## 2018-03-08 MED ORDER — EPINEPHRINE PF 1 MG/ML IJ SOLN
INTRAOCULAR | Status: DC | PRN
  Administered 2018-03-08: 1 mL via OPHTHALMIC

## 2018-03-08 MED ORDER — ONDANSETRON HCL 4 MG/2ML IJ SOLN: 4.0000 mg | Freq: Once | INTRAMUSCULAR | Status: DC | PRN

## 2018-03-08 MED ORDER — MOXIFLOXACIN HCL 0.5 % OP SOLN
OPHTHALMIC | Status: AC
  Filled 2018-03-08: qty 3

## 2018-03-08 MED ORDER — TETRACAINE HCL 0.5 % OP SOLN
OPHTHALMIC | Status: AC
  Filled 2018-03-08: qty 4

## 2018-03-08 MED ORDER — FENTANYL CITRATE (PF) 100 MCG/2ML IJ SOLN
INTRAMUSCULAR | Status: DC | PRN
  Administered 2018-03-08: 50 ug via INTRAVENOUS
  Administered 2018-03-08: 25 ug via INTRAVENOUS

## 2018-03-08 MED ORDER — NA CHONDROIT SULF-NA HYALURON 40-17 MG/ML IO SOLN
INTRAOCULAR | Status: AC
  Filled 2018-03-08: qty 1

## 2018-03-08 MED ORDER — TETRACAINE HCL 0.5 % OP SOLN
1.0000 [drp] | OPHTHALMIC | Status: AC
  Administered 2018-03-08 (×3): 1 [drp] via OPHTHALMIC

## 2018-03-08 MED ORDER — POVIDONE-IODINE 5 % OP SOLN
OPHTHALMIC | Status: AC
  Filled 2018-03-08: qty 30

## 2018-03-08 MED ORDER — LIDOCAINE HCL (PF) 4 % IJ SOLN
INTRAMUSCULAR | Status: AC
  Filled 2018-03-08: qty 5

## 2018-03-08 MED ORDER — EPINEPHRINE PF 1 MG/ML IJ SOLN
INTRAMUSCULAR | Status: AC
  Filled 2018-03-08: qty 1

## 2018-03-08 MED ORDER — POVIDONE-IODINE 5 % OP SOLN
OPHTHALMIC | Status: DC | PRN
  Administered 2018-03-08: 1 via OPHTHALMIC

## 2018-03-08 MED ORDER — MOXIFLOXACIN HCL 0.5 % OP SOLN
OPHTHALMIC | Status: DC | PRN
  Administered 2018-03-08: .2 mL via OPHTHALMIC

## 2018-03-08 MED ORDER — NA CHONDROIT SULF-NA HYALURON 40-17 MG/ML IO SOLN
INTRAOCULAR | Status: DC | PRN
  Administered 2018-03-08: 1 mL via INTRAOCULAR

## 2018-03-08 MED ORDER — ARMC OPHTHALMIC DILATING DROPS
1.0000 "application " | OPHTHALMIC | Status: AC | PRN
  Administered 2018-03-08 (×3): 1 via OPHTHALMIC

## 2018-03-08 SURGICAL SUPPLY — 16 items
GLOVE BIO SURGEON STRL SZ8 (GLOVE) ×3 IMPLANT
GLOVE BIOGEL M 6.5 STRL (GLOVE) ×3 IMPLANT
GLOVE SURG LX 8.0 MICRO (GLOVE) ×2
GLOVE SURG LX STRL 8.0 MICRO (GLOVE) ×1 IMPLANT
GOWN STRL REUS W/ TWL LRG LVL3 (GOWN DISPOSABLE) ×2 IMPLANT
GOWN STRL REUS W/TWL LRG LVL3 (GOWN DISPOSABLE) ×6
LABEL CATARACT MEDS ST (LABEL) ×3 IMPLANT
LENS IOL TECNIS ITEC 21.0 (Intraocular Lens) ×2 IMPLANT
PACK CATARACT (MISCELLANEOUS) ×3 IMPLANT
PACK CATARACT BRASINGTON LX (MISCELLANEOUS) ×3 IMPLANT
PACK EYE AFTER SURG (MISCELLANEOUS) ×3 IMPLANT
SOL BSS BAG (MISCELLANEOUS) ×3
SOLUTION BSS BAG (MISCELLANEOUS) ×1 IMPLANT
SYR 5ML LL (SYRINGE) ×3 IMPLANT
WATER STERILE IRR 250ML POUR (IV SOLUTION) ×3 IMPLANT
WIPE NON LINTING 3.25X3.25 (MISCELLANEOUS) ×3 IMPLANT

## 2018-03-08 NOTE — Anesthesia Postprocedure Evaluation (Signed)
Anesthesia Post Note  Patient: Walter Salas  Procedure(s) Performed: CATARACT EXTRACTION PHACO AND INTRAOCULAR LENS PLACEMENT ([place]) LEFT (Left Eye)  Patient location during evaluation: Phase II Anesthesia Type: MAC Level of consciousness: awake, awake and alert and oriented Pain management: satisfactory to patient Vital Signs Assessment: post-procedure vital signs reviewed and stable Respiratory status: spontaneous breathing Anesthetic complications: no     Last Vitals:  Vitals:   02/28/18 1417 03/08/18 0903  BP: 110/61 120/76  Pulse: 82 78  Resp:  14  Temp:  (!) 36.4 C  SpO2:  100%    Last Pain:  Vitals:   03/08/18 0903  TempSrc: Oral  PainSc: 0-No pain                 Philbert Riser

## 2018-03-08 NOTE — Anesthesia Post-op Follow-up Note (Signed)
Anesthesia QCDR form completed.        

## 2018-03-08 NOTE — Transfer of Care (Signed)
Immediate Anesthesia Transfer of Care Note  Patient: Walter Salas  Procedure(s) Performed: CATARACT EXTRACTION PHACO AND INTRAOCULAR LENS PLACEMENT ([facility]) LEFT (Left Eye)  Patient Location: PACU  Anesthesia Type:MAC  Level of Consciousness: awake, alert  and oriented  Airway & Oxygen Therapy: Patient Spontanous Breathing  Post-op Assessment: Report given to RN and Post -op Vital signs reviewed and stable  Post vital signs: Reviewed and stable  Last Vitals:  Vitals Value Taken Time  BP    Temp    Pulse    Resp    SpO2      Last Pain:  Vitals:   03/08/18 0903  TempSrc: Oral  PainSc: 0-No pain         Complications: No apparent anesthesia complications

## 2018-03-08 NOTE — Discharge Instructions (Addendum)
Eye Surgery Discharge Instructions  Expect mild scratchy sensation or mild soreness. DO NOT RUB YOUR EYE!  The day of surgery:  Minimal physical activity, but bed rest is not required  No reading, computer work, or close hand work  No bending, lifting, or straining.  May watch TV  For 24 hours:  No driving, legal decisions, or alcoholic beverages  Safety precautions  Eat anything you prefer: It is better to start with liquids, then soup then solid foods.  Solar shield eyeglasses should be worn for comfort in the sunlight/patch while sleeping  Resume all regular medications including aspirin or Coumadin if these were discontinued prior to surgery. You may shower, bathe, shave, or wash your hair. Tylenol may be taken for mild discomfort. Follow Dr. Inda Coke eye drop instruction sheet as reviewed.  Call your doctor if you experience significant pain, nausea, or vomiting, fever > 101 or other signs of infection. [phone] or [phone] Specific instructions:  Follow-up Information    Birder Robson, MD Follow up.   Specialty:  Ophthalmology Why:  03/09/18 @ 10:50 am Contact information: [address] [address] [phone]

## 2018-03-08 NOTE — Op Note (Signed)
PREOPERATIVE DIAGNOSIS:  Nuclear sclerotic cataract of the left eye.   POSTOPERATIVE DIAGNOSIS:  Nuclear sclerotic cataract of the left eye.   OPERATIVE PROCEDURE: Procedure(s): CATARACT EXTRACTION PHACO AND INTRAOCULAR LENS PLACEMENT ([facility]) LEFT   SURGEON:  Birder Robson, MD.   ANESTHESIA:  Anesthesiologist: Alvin Critchley, MD CRNA: Philbert Riser, CRNA  1.      Managed anesthesia care. 2.     0.6ml of Shugarcaine was instilled following the paracentesis   COMPLICATIONS:  None.   TECHNIQUE:   Stop and chop   DESCRIPTION OF PROCEDURE:  The patient was examined and consented in the preoperative holding area where the aforementioned topical anesthesia was applied to the left eye and then brought back to the Operating Room where the left eye was prepped and draped in the usual sterile ophthalmic fashion and a lid speculum was placed. A paracentesis was created with the side port blade and the anterior chamber was filled with viscoelastic. A near clear corneal incision was performed with the steel keratome. A continuous curvilinear capsulorrhexis was performed with a cystotome followed by the capsulorrhexis forceps. Hydrodissection and hydrodelineation were carried out with BSS on a blunt cannula. The lens was removed in a stop and chop  technique and the remaining cortical material was removed with the irrigation-aspiration handpiece. The capsular bag was inflated with viscoelastic and the Technis [id number] lens was placed in the capsular bag without complication. The remaining viscoelastic was removed from the eye with the irrigation-aspiration handpiece. The wounds were hydrated. The anterior chamber was flushed with Miostat and the eye was inflated to physiologic pressure. 0.22ml Vigamox was placed in the anterior chamber. The wounds were found to be water tight. The eye was dressed with Vigamox. The patient was given protective glasses to wear throughout the day and a shield with which to sleep  tonight. The patient was also given drops with which to begin a drop regimen today and will follow-up with me in one day. Implant Name Type Inv. Item Serial No. Manufacturer Lot No. LRB No. Used  LENS IOL DIOP 21.0 - [id number] Intraocular Lens LENS IOL DIOP 21.0 [id number] [place]  Left 1    Procedure(s) with comments: CATARACT EXTRACTION PHACO AND INTRAOCULAR LENS PLACEMENT ([facility]) LEFT (Left) - [place]  00:24.2 CDE 3.25 Fluid pack lot # [id number]  Electronically signed: Birder Robson 03/08/2018 10:00 AM

## 2018-03-08 NOTE — H&P (Signed)
All labs reviewed. Abnormal studies sent to patients PCP when indicated.  Previous H&P reviewed, patient examined, there are NO CHANGES.  Walter Jayne Porfilio2/25/20209:34 AM

## 2018-03-08 NOTE — Anesthesia Preprocedure Evaluation (Signed)
Anesthesia Evaluation  Patient identified by MRN, date of birth, ID band Patient awake    Reviewed: Allergy & Precautions, H&P , NPO status , Patient's Chart, lab work & pertinent test results  History of Anesthesia Complications Negative for: history of anesthetic complications  Airway Mallampati: III  TM Distance: <3 FB Neck ROM: limited    Dental  (+) Chipped, Poor Dentition, Missing   Pulmonary neg shortness of breath, sleep apnea , COPD, Current Smoker, former smoker,           Cardiovascular Exercise Tolerance: Good hypertension, (-) angina+ CAD, + Past MI, + Peripheral Vascular Disease and +CHF  (-) DOE      Neuro/Psych PSYCHIATRIC DISORDERS Depression  Neuromuscular disease    GI/Hepatic negative GI ROS, Neg liver ROS, neg GERD  ,  Endo/Other  diabetes, Type 2, Oral Hypoglycemic Agents  Renal/GU Renal disease  negative genitourinary   Musculoskeletal  (+) Arthritis , Osteoarthritis,    Abdominal   Peds  Hematology negative hematology ROS (+)   Anesthesia Other Findings Past Medical History: No date: Arthritis No date: CAD (coronary artery disease) No date: CAD (coronary artery disease) No date: Chronic pain No date: Colon polyp No date: Depression No date: Diabetes mellitus without complication ([place]) 7893: Hx of laminectomy No date: Hyperlipidemia No date: Hypertension No date: Kidney stone No date: Ulcer  Past Surgical History: 1993, 1994, 1997, 1998: BACK SURGERY 1990: CARPAL TUNNEL RELEASE; Bilateral 2000. 2003: CERVICAL FUSION 2000: CHOLECYSTECTOMY 2014: CORONARY ANGIOPLASTY WITH STENT PLACEMENT     Comment[facility] [place], [facility] 2012: SHOULDER ARTHROSCOPY; Right 2007: TUMOR EXCISION  BMI    Body Mass Index:  30.71 kg/m      Reproductive/Obstetrics negative OB ROS                             Anesthesia Physical  Anesthesia  Plan  ASA: IV  Anesthesia Plan: MAC   Post-op Pain Management:    Induction: Intravenous  PONV Risk Score and Plan:   Airway Management Planned: Nasal Cannula  Additional Equipment:   Intra-op Plan:   Post-operative Plan:   Informed Consent: I have reviewed the patients History and Physical, chart, labs and discussed the procedure including the risks, benefits and alternatives for the proposed anesthesia with the patient or authorized representative who has indicated his/her understanding and acceptance.     Dental Advisory Given  Plan Discussed with: Anesthesiologist, CRNA and Surgeon  Anesthesia Plan Comments: (Patient consented for risks of anesthesia including but not limited to:  - adverse reactions to medications - risk of intubation if required - damage to teeth, lips or other oral mucosa - sore throat or hoarseness - Damage to heart, brain, lungs or loss of life  Patient voiced understanding.)        Anesthesia Quick Evaluation

## 2018-03-24 DIAGNOSIS — H2511 Age-related nuclear cataract, right eye: Secondary | ICD-10-CM | POA: Diagnosis not present

## 2018-04-04 ENCOUNTER — Ambulatory Visit: Payer: Self-pay | Admitting: *Deleted

## 2018-04-04 NOTE — Telephone Encounter (Signed)
Patient calling with upper chest feeling mildly tight. No wheeze/fever/SOB. States it started over the weekend while outside in the pollen. Usually occurs during pollen season. Coughing spells with blackish phlegm since he quit smoking 6 weeks ago. Advice care given including mucinex and increased water intake. Urgent symptoms reviewed that would require immediate evaluation. Give symptoms one week to improve, if not please call back. Reason for Disposition . Chest pain(s) lasting a few seconds from coughing  Answer Assessment - Initial Assessment Questions 1. LOCATION: "Where does it hurt?"       Chest feels tight upper center. 2. RADIATION: "Does the pain go anywhere else?" (e.g., into neck, jaw, arms, back)    Constant. Does not radiate 3. ONSET: "When did the chest pain begin?" (Minutes, hours or days)      saturday 4. PATTERN "Does the pain come and go, or has it been constant since it started?"  "Does it get worse with exertion?"      Constant rates a 5 regardless of exertion 5. DURATION: "How long does it last" (e.g., seconds, minutes, hours)    constant 6. SEVERITY: "How bad is the pain?"  (e.g., Scale 1-10; mild, moderate, or severe)5    - MILD (1-3): doesn't interfere with normal activities     - MODERATE (4-7): interferes with normal activities or awakens from sleep    - SEVERE (8-10): excruciating pain, unable to do any normal activities       Mild to moderate 7. CARDIAC RISK FACTORS: "Do you have any history of heart problems or risk factors for heart disease?" (e.g., prior heart attack, angina; high blood pressure, diabetes, being overweight, high cholesterol, smoking, or strong family history of heart disease)     htn high lipids. Quit smoking 6 weeks ago. 8. PULMONARY RISK FACTORS: "Do you have any history of lung disease?"  (e.g., blood clots in lung, asthma, emphysema, birth control pills)     none 9. CAUSE: "What do you think is causing the chest pain?"     Pollen is  probable 10. OTHER SYMPTOMS: "Do you have any other symptoms?" (e.g., dizziness, nausea, vomiting, sweating, fever, difficulty breathing, cough)       Cough on off since quitting smoking productive blackish brownish. 11. PREGNANCY: "Is there any chance you are pregnant?" "When was your last menstrual period?"       no  Protocols used: CHEST PAIN-A-AH

## 2018-04-05 ENCOUNTER — Encounter: Admission: RE | Payer: Self-pay | Source: Home / Self Care

## 2018-04-05 ENCOUNTER — Ambulatory Visit: Admission: RE | Admit: 2018-04-05 | Payer: Medicare Other | Source: Home / Self Care | Admitting: Ophthalmology

## 2018-04-05 SURGERY — PHACOEMULSIFICATION, CATARACT, WITH IOL INSERTION
Anesthesia: Choice | Laterality: Right

## 2018-04-06 ENCOUNTER — Other Ambulatory Visit: Payer: Self-pay | Admitting: *Deleted

## 2018-04-06 NOTE — Telephone Encounter (Signed)
Please advise if ok to Refill Request.

## 2018-04-07 ENCOUNTER — Other Ambulatory Visit: Payer: Self-pay | Admitting: Family Medicine

## 2018-04-07 NOTE — Telephone Encounter (Signed)
Attempted to call the patient regarding his Chantix refill.  The patient is unavailable per his wife. I did speak with Mrs. Rolf (ok per DPR) to inquire if Mr. Fjeld is still using Chantix (perscribed in January).  Per Mrs. Maddocks, the patient is still using this and doing "very well." He has decreased smoking quite a bit, but she is unsure of how much he is currently smoking.  I advised the Mrs. Fraticelli that I am sending a message to Dr. Saunders Revel to see if it ok to refill Chantix again or if a refill is not appropriate.   Mrs. Draheim voices understanding.

## 2018-04-07 NOTE — Telephone Encounter (Signed)
As it sounds like he is benefiting form Chantix, I think it is fine to send in a one-time refill for the maintenance pack (1 mg PO BID x 4 weeks).  He should continue to work on quitting smoking, as indefinite Chantix and tobacco use is not ideal.  Nelva Bush, MD [place] Pager: [phone]

## 2018-04-07 NOTE — Telephone Encounter (Signed)
Patient wife returning call to let us know patient does not want chantix refill at this time as he still has plenty at home.

## 2018-04-07 NOTE — Telephone Encounter (Signed)
Noted. Will not refill Chantix at this time.

## 2018-04-08 ENCOUNTER — Other Ambulatory Visit: Payer: Self-pay | Admitting: *Deleted

## 2018-04-08 NOTE — Telephone Encounter (Signed)
Please advise if ok to refill Chantix.

## 2018-04-11 NOTE — Telephone Encounter (Signed)
I spoke with the patient's wife on 04/06/18. She advised the patient did not need a refill on Chantix at this time.   Will not refill unless patient calls back to request this.

## 2018-04-15 ENCOUNTER — Telehealth: Payer: Self-pay | Admitting: *Deleted

## 2018-04-15 NOTE — Telephone Encounter (Signed)

## 2018-04-19 NOTE — Progress Notes (Signed)
Virtual Visit via Telephone Note   This visit type was conducted due to national recommendations for restrictions regarding the COVID-19 Pandemic (e.g. social distancing) in an effort to limit this patient's exposure and mitigate transmission in our community.  Due to his co-morbid illnesses, this patient is at least at moderate risk for complications without adequate follow up.  This format is felt to be most appropriate for this patient at this time.  The patient did not have access to video technology/had technical difficulties with video requiring transitioning to audio format only (telephone).  All issues noted in this document were discussed and addressed.  No physical exam could be performed with this format.  Verbal consent obtained from the patient.  Evaluation Performed:  Follow-up visit  Date:  04/20/2018   ID:  Walter Salas, DOB [birth year], MRN [id number]  Patient Location: Home  Provider Location: Office  PCP:  Leone Haven, MD  Cardiologist:  Nelva Bush, MD  Electrophysiologist:  None   Chief Complaint: Follow-up coronary artery disease  History of Present Illness:    Walter Salas is a 63 y.o. male who presents via audio/video conferencing for a telehealth visit today.  He has a history of coronary artery disease status post PCI's to the LCx and RCA in 2014, HTN, HLD, type II DM, depression, renal stones, and fall with intracranial hemorrhage.  I last saw him in 01/2018, at which time he reported intermittent nonexertional chest tightness, usually lasting 30 to 40 minutes.  This had been stable for at least a year.  We had tried isosorbide mononitrate last year, though Walter Salas was intolerant of this due to headaches and ultimately needed to stop.  We agreed to add amlodipine 2.5 mg daily for antianginal therapy as well as try Chantix to assist with smoking cessation.  He happily reports today that he has been smoke-free for about 2 months and will soon be  completing his course of Chantix.  In regard to his chest pain, Walter Salas has noticed significant improvement since starting amlodipine.  He now notes only rare episodes of nonexertional chest discomfort that resolves spontaneously.  He is able to walk for 20 minutes at a time without any angina or dyspnea.  He denies palpitations, lightheadedness, and edema as well.  He checks his blood pressure regularly at home and notes that it is typically 100-130/60-70.  He has not needed to use any sublingual nitroglycerin.  The patient does not have symptoms concerning for COVID-19 infection (fever, chills, cough, or new shortness of breath).    Past Medical History:  Diagnosis Date   Agent orange exposure    Anginal pain ([place])    Arthritis    CAD (coronary artery disease)    a. s/p PCI/DES to the LCx and RCA in 2014   Cancer ([place])    skin   CHF (congestive heart failure) (HCC)    Chronic cough    Chronic pain    Colon polyp    Depression    Diabetes mellitus without complication ([place])    Diverticulosis    History of kidney stones    History of stomach ulcers    HOH (hard of hearing)    Hx of laminectomy 1999   Hyperlipidemia    Hypertension    Kidney stone    Myocardial infarction [facility])    Sleep apnea    Syncope    a. Event monitor 2018: NSR with first-degree AV block, no significant arrhythmias or pauses;  b.  Myoview showed a small in size, mild in severity fixed basal and anterior lateral defect, no ischemia, LVEF 55 to 65%, low risk study   Thoracic aortic aneurysm ([place])    a. TTE 2018: EF 60-65%, normal wall motion, Gr1DD, mild aortic valve calcification, mildly dilated aortic root measuring up to 4.2 cm, normal RV cavity size and systolic function, normal PASP; b. CTA aorta 6/18:showed an ascending thoracic aortic aneurysmal dilatation with a maximum diameter of 4.3 cm   Ulcer    Past Surgical History:  Procedure Laterality Date   [place] Bilateral 1990   CATARACT EXTRACTION W/PHACO Left 03/08/2018   Procedure: CATARACT EXTRACTION PHACO AND INTRAOCULAR LENS PLACEMENT ([place]) LEFT;  Surgeon: Birder Robson, MD;  Location: [facility] ORS;  Service: Ophthalmology;  Laterality: Left;  [place]  00:24.2 CDE 3.25 Fluid pack lot # [id number]   CERVICAL FUSION  2000. 2003   CHOLECYSTECTOMY  2000   COLONOSCOPY WITH PROPOFOL N/A 02/23/2017   Procedure: COLONOSCOPY WITH PROPOFOL;  Surgeon: Lollie Sails, MD;  Location[facility] ENDOSCOPY;  Service: Endoscopy;  Laterality: N/A;   CORONARY ANGIOPLASTY WITH STENT PLACEMENT  [facility]   [facility]   ESOPHAGOGASTRODUODENOSCOPY (EGD) WITH PROPOFOL N/A 02/23/2017   Procedure: ESOPHAGOGASTRODUODENOSCOPY (EGD) WITH PROPOFOL;  Surgeon: Lollie Sails, MD;  Location: [facility] END[facility]COPY;  Service: Endoscopy;  Laterality: N/A;   PACEMAKER IMPLANT     SHOULDER ARTHROSCOPY Right 2012   TUMOR EXCISION  2007     Current Meds  Medication Sig   amLODipine (NORVASC) 2.5 MG tablet Take 1 tablet (2.5 mg total) by mouth daily.   aspirin 81 MG tablet Take 81 mg by mouth daily.   CHANTIX 1 MG tablet TAKE 1 TABLET BY MOUTH TWICE A DAY (Patient taking differently: Take 1 mg by mouth 2 (two) times daily. )   chlorthalidone (HYGROTON) 50 MG tablet Take 25 mg by mouth daily.   cholecalciferol (VITAMIN D) 1000 UNITS tablet Take 1 tablet (1,000 Units total) by mouth QID. (Patient taking differently: Take 4,000 Units by mouth daily. )   DULoxetine (CYMBALTA) 20 MG capsule Take 20 mg by mouth daily.   fenofibrate (TRICOR) 145 MG tablet Take 1 tablet (145 mg total) by mouth daily.   glucose blood test strip Check once daily, E11.9   JANUVIA 100 MG tablet TAKE 1 TABLET BY MOUTH EVERY DAY   lidocaine (LIDODERM) 5 % Place 1 patch onto the skin daily. Remove & Discard patch within 12 hours or as directed by MD (Patient taking differently: Place  1 patch onto the skin daily as needed (pain). Remove & Discard patch within 12 hours or as directed by MD)   losartan (COZAAR) 25 MG tablet Take 25 mg by mouth daily.   metFORMIN (GLUCOPHAGE) 1000 MG tablet Take 1,000 mg by mouth 2 (two) times daily with a meal.   methocarbamol (ROBAXIN) 500 MG tablet Take 500 mg by mouth 2 (two) times daily as needed for muscle spasms.    methyl salicylate liquid Apply 1 application topically as needed for muscle pain.   nitroGLYCERIN (NITROSTAT) 0.4 MG SL tablet Place 1 tablet (0.4 mg total) under the tongue every 5 (five) minutes as needed for chest pain. May take up to 3 doses.   omeprazole (PRILOSEC) 20 MG capsule Take 20 mg by mouth daily.   polyvinyl alcohol (LIQUIFILM TEARS) 1.4 % ophthalmic solution Place 1 drop  into both eyes as needed for dry eyes.    potassium chloride SA (K-DUR,KLOR-CON) 20 MEQ tablet Take 1 tablet (20 mEq total) by mouth daily.   pregabalin (LYRICA) 50 MG capsule Take 50 mg by mouth 3 (three) times daily.   rosuvastatin (CRESTOR) 40 MG tablet Take 1 tablet (40 mg total) by mouth daily.   traMADol (ULTRAM) 50 MG tablet Take 2 tablets (100 mg total) by mouth every 8 (eight) hours as needed for moderate pain. (Patient taking differently: Take 50 mg by mouth every 8 (eight) hours as needed for moderate pain. )   vitamin B-12 (CYANOCOBALAMIN) 1000 MCG tablet Take 1 tablet (1,000 mcg total) by mouth daily.     Allergies:   Gabapentin; Isosorbide mononitrate er [isosorbide dinitrate]; and Tylenol [acetaminophen]   Social History   Tobacco Use   Smoking status: Former Smoker    Packs/day: 1.50    Years: 50.00    Pack years: 75.00    Types: Cigarettes    Last attempt to quit: 02/15/2018    Years since quitting: 0.1   Smokeless tobacco: Never Used  Substance Use Topics   Alcohol use: No   Drug use: No     Family Hx: The patient's family history includes Diabetes in his brother, father, mother, and sister; Heart  disease in his brother, father, paternal grandfather, and sister.  ROS:   Please see the history of present illness.   All other systems reviewed and are negative.   Prior CV studies:   The following studies were reviewed today:  Cath/PCI:  [facility]08/[facility] and 09/[facility]) with drug-eluting stent placement to the mid LCx followed by the mid/distal RCA.  EP Procedures and Devices:  30-day event monitor (05/08/16): Sinus rhythm with first-degree AV block. No significant arrhythmias or pauses.  Non-Invasive Evaluation(s):  CTA chest (11/29/17): Stable 4.3 cm ascending thoracic aortic aneurysm.  5 mm left upper lobe nodule; recommend follow-up CT in 12 months.  Coronary artery calcification.  Stable small pericardial effusion and left adrenal myolipoma.  TTE (05/18/16): Normal LV size and contraction. LVEF 60-65% with normal wall motion. Grade 1 diastolic dysfunction. Mild aortic valve calcification and mildly dilated aortic root, measuring up to 4.2 cm. Normal RV size and function. Normal pulmonary artery pressure.  Pharmacologic MPI (05/15/16): Low risk study with small in size, mild in severity, fixed basal anterolateral defect. No ischemia. LVEF 55-65%.  Labs/Other Tests and Data Reviewed:    EKG:  No ECG reviewed.  Recent Labs: 10/18/2017: ALT 12 11/22/2017: BUN 12; Creatinine, Ser 0.90; Potassium 4.0; Sodium 138   Recent Lipid Panel Lab Results  Component Value Date/Time   CHOL 135 10/18/2017 09:59 AM   TRIG 139.0 10/18/2017 09:59 AM   HDL 34.50 (L) 10/18/2017 09:59 AM   CHOLHDL 4 10/18/2017 09:59 AM   LDLCALC 73 10/18/2017 09:59 AM   LDLDIRECT 78.0 08/27/2016 08:09 AM    Wt Readings from Last 3 Encounters:  04/20/18 230 lb (104.3 kg)  02/28/18 226 lb (102.5 kg)  02/22/18 228 lb 12.8 oz (103.8 kg)     Objective:    Vital Signs:  BP 122/70 (BP Location: Left Arm, Patient Position: Sitting, Cuff Size: Large)    Ht 5\' 8"  (1.727 m)    Wt 230 lb (104.3  kg)    BMI 34.97 kg/m    ASSESSMENT & PLAN:    Coronary artery disease with stable angina: Chest pain has significantly improved with addition of amlodipine.  It  remains nonexertional and is infrequent.  I recommend continuing low-dose amlodipine for antianginal therapy.  Indefinite aspirin and statin therapy will also be maintained for secondary prevention.  Hypertension: Blood pressure well controlled.  Continue amlodipine and losartan.  Hyperlipidemia: Most recent LDL in 10/2017 was slightly above goal at 73.  Continue rosuvastatin 40 mg daily with ongoing lifestyle modifications.  Tobacco use I congratulated Walter Salas on quitting smoking 2 months ago.  He should continue to abstain from tobacco and complete his course of Chantix.  COVID-19 Education: The signs and symptoms of COVID-19 were discussed with the patient and how to seek care for testing (follow up with PCP or arrange E-visit).  The importance of social distancing was discussed today.  Time:   Today, I have spent 8 minutes with the patient with telehealth technology discussing the above problems.     Medication Adjustments/Labs and Tests Ordered: Current medicines are reviewed at length with the patient today.  Concerns regarding medicines are outlined above.   Tests Ordered: None.  Medication Changes: None.  Disposition:  Follow up in 6 month(s)  Signed, Nelva Bush, MD  04/20/2018 2:02 PM    [facility]

## 2018-04-20 ENCOUNTER — Telehealth (INDEPENDENT_AMBULATORY_CARE_PROVIDER_SITE_OTHER): Payer: Medicare Other | Admitting: Internal Medicine

## 2018-04-20 ENCOUNTER — Encounter: Payer: Self-pay | Admitting: Internal Medicine

## 2018-04-20 ENCOUNTER — Telehealth: Payer: Medicare Other | Admitting: Internal Medicine

## 2018-04-20 ENCOUNTER — Other Ambulatory Visit: Payer: Self-pay

## 2018-04-20 VITALS — BP 122/70 | Ht 68.0 in | Wt 230.0 lb

## 2018-04-20 DIAGNOSIS — E785 Hyperlipidemia, unspecified: Secondary | ICD-10-CM

## 2018-04-20 DIAGNOSIS — Z720 Tobacco use: Secondary | ICD-10-CM

## 2018-04-20 DIAGNOSIS — I10 Essential (primary) hypertension: Secondary | ICD-10-CM

## 2018-04-20 DIAGNOSIS — I25118 Atherosclerotic heart disease of native coronary artery with other forms of angina pectoris: Secondary | ICD-10-CM

## 2018-04-20 NOTE — Patient Instructions (Signed)
Medication Instructions:  Your physician recommends that you continue on your current medications as directed. Please refer to the Current Medication list given to you today.  If you need a refill on your cardiac medications before your next appointment, please call your pharmacy.   Lab work: none If you have labs (blood work) drawn today and your tests are completely normal, you will receive your results only by: Marland Kitchen MyChart Message (if you have MyChart) OR . A paper copy in the mail If you have any lab test that is abnormal or we need to change your treatment, we will call you to review the results.  Testing/Procedures: none  Follow-Up: At [facility], you and your health needs are our priority.  As part of our continuing mission to provide you with exceptional heart care, we have created designated Provider Care Teams.  These Care Teams include your primary Cardiologist (physician) and Advanced Practice Providers (APPs -  Physician Assistants and Nurse Practitioners) who all work together to provide you with the care you need, when you need it. You will need a follow up appointment in 6 months.  Please call our office 2 months in advance to schedule this appointment.  You may see Nelva Bush, MD or one of the following Advanced Practice Providers on your designated Care Team:   Murray Hodgkins, NP Christell Faith, PA-C . Marrianne Mood, PA-C

## 2018-04-22 ENCOUNTER — Ambulatory Visit: Payer: Medicare Other

## 2018-04-26 ENCOUNTER — Ambulatory Visit: Payer: Medicare Other

## 2018-04-29 ENCOUNTER — Ambulatory Visit: Payer: Medicare Other | Admitting: Internal Medicine

## 2018-05-01 IMAGING — RF DG ESOPHAGUS
6 of 8 series · 14 of 19 positions shown · non-contrast
Comparison: None.

CLINICAL DATA: Dysphagia

EXAM:
ESOPHOGRAM / BARIUM SWALLOW / BARIUM TABLET STUDY
TECHNIQUE: Combined double contrast and single contrast examination performed
using effervescent crystals, thick barium liquid, and thin barium
liquid. The patient was observed with fluoroscopy swallowing a 13 mm
barium sulphate tablet.
FLUOROSCOPY TIME:  Fluoroscopy Time:  0.7 minute
Radiation Exposure Index (if provided by the fluoroscopic device): 8
mGy
Number of Acquired Spot Images: 0

[Series 1: cp_standard · 0.50mm/px · 3 of 19 frames shown (1 of 6)]
[frame 3/19]
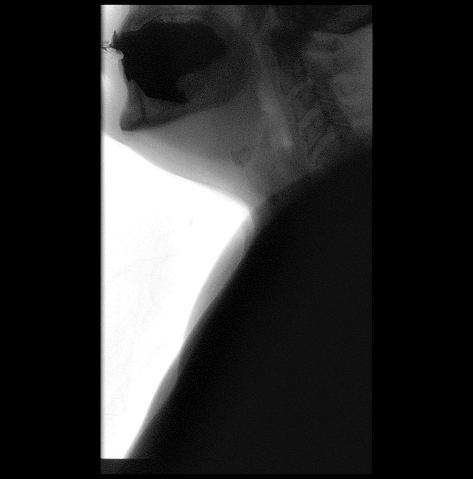
[frame 17/19]
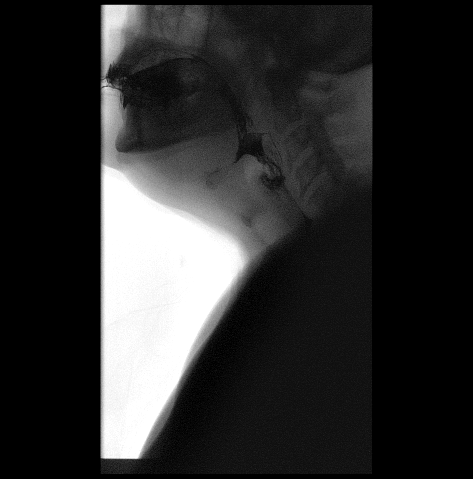
[frame 19/19]
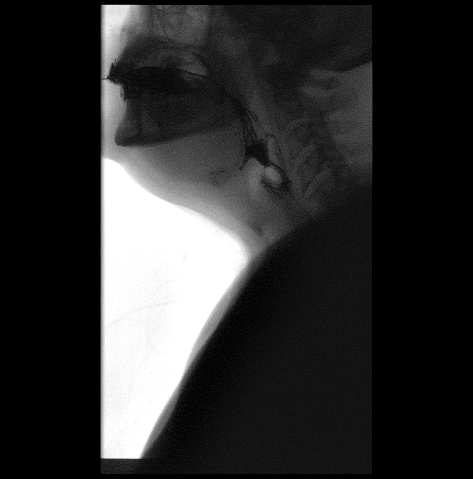

[Series 2: cp_standard · 0.51mm/px · 3 of 31 frames shown (2 of 6)]
[frame 5/31]
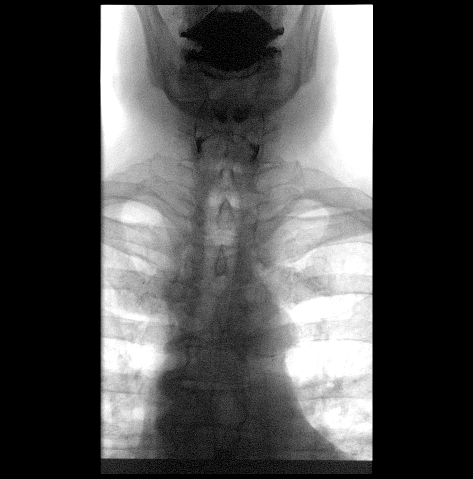
[frame 16/31]
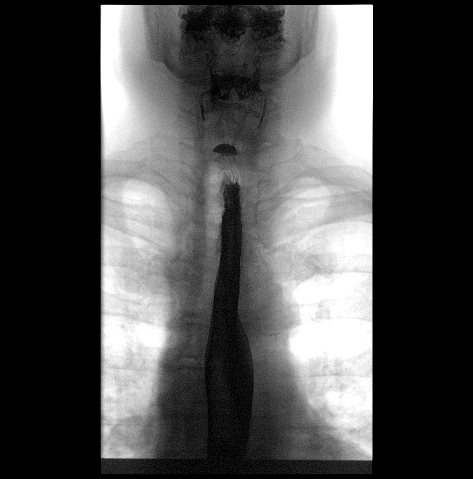
[frame 27/31]
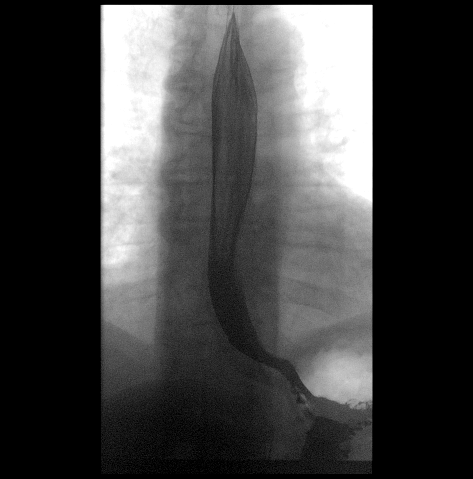

[Series 3: cp_standard · 0.51mm/px · 3 of 73 frames shown (3 of 6)]
[frame 4/73]
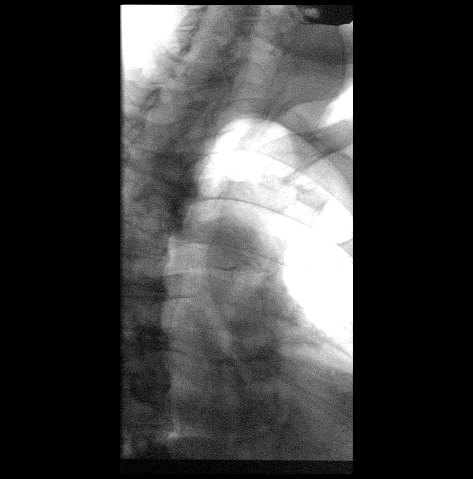
[frame 37/73]
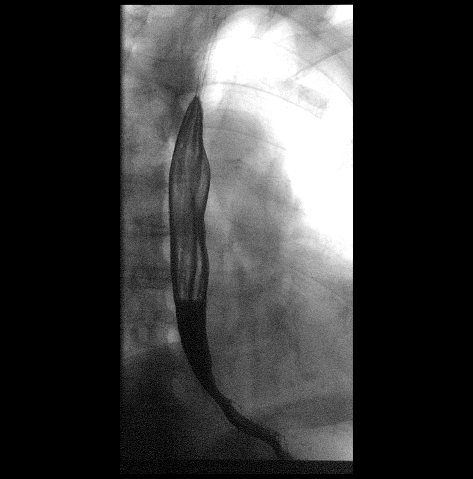
[frame 63/73]
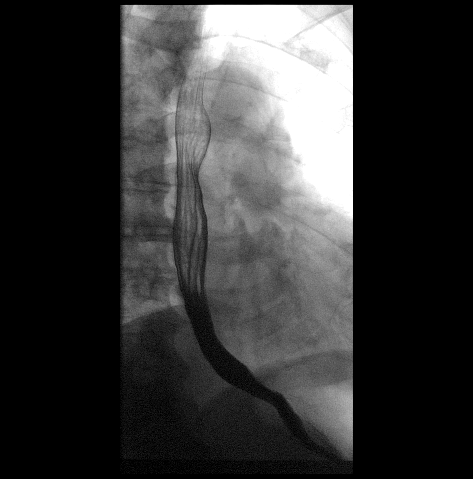

[Series 4: cp_standard · 0.25mm/px · 1 of 1 slices shown (4 of 6)]
[im 1/1]
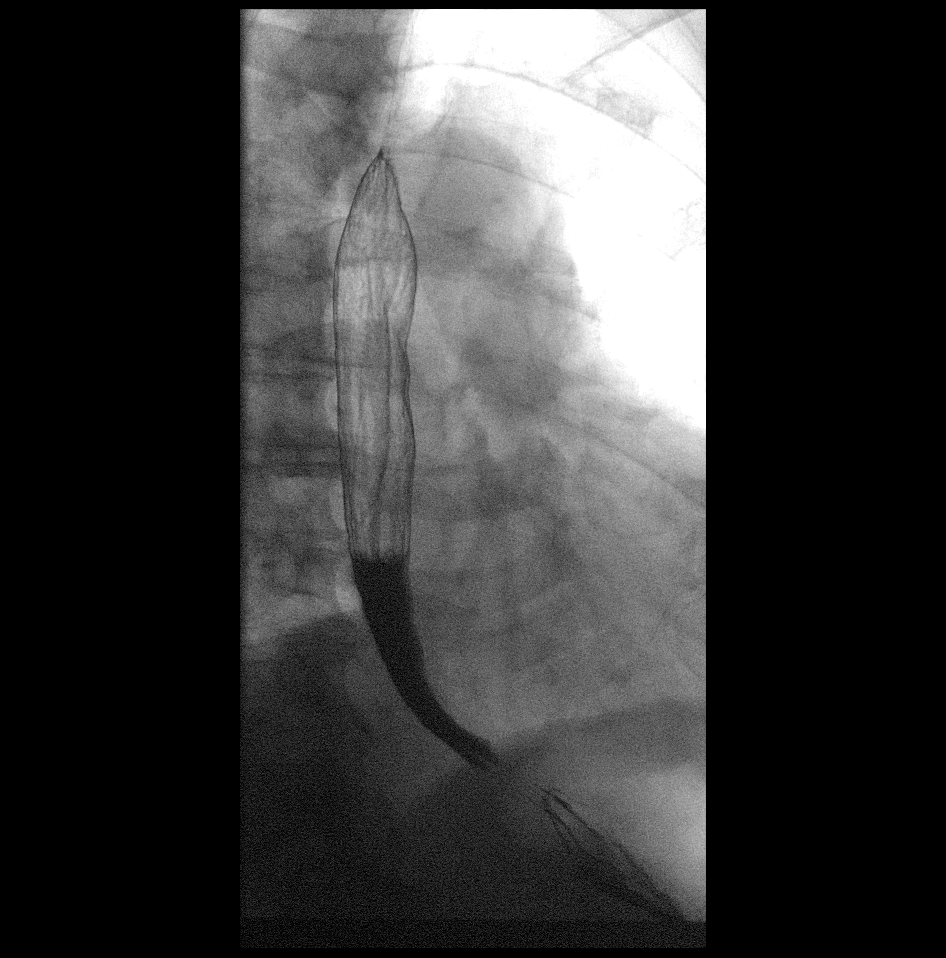

[Series 6: cp_standard · 0.51mm/px · 3 of 4 frames shown (5 of 6)]
[frame 1/4]
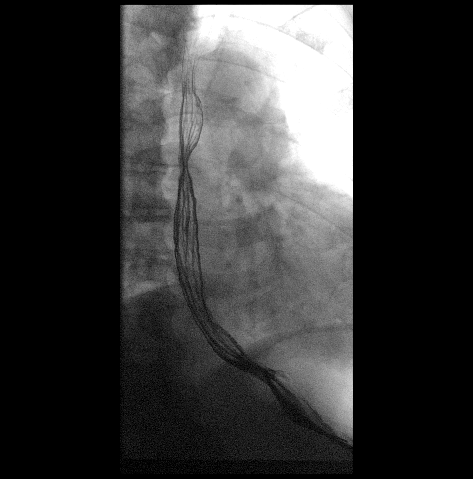
[frame 3/4]
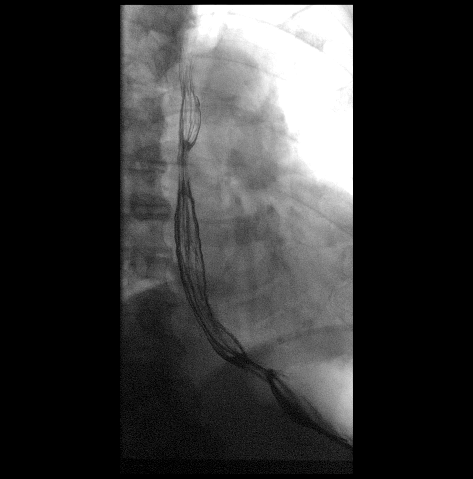
[frame 4/4]
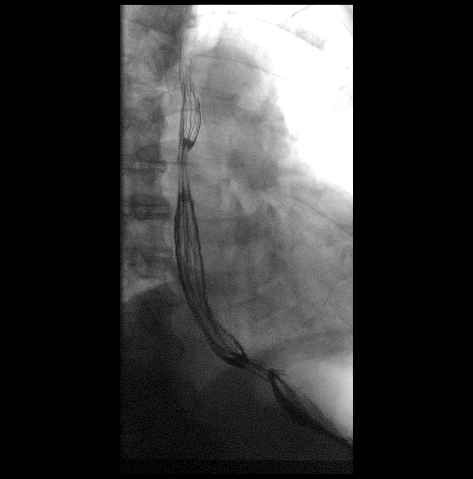

[Series 8: cp_standard · 0.28mm/px · 1 of 1 slices shown (6 of 6)]
[im 1/1]
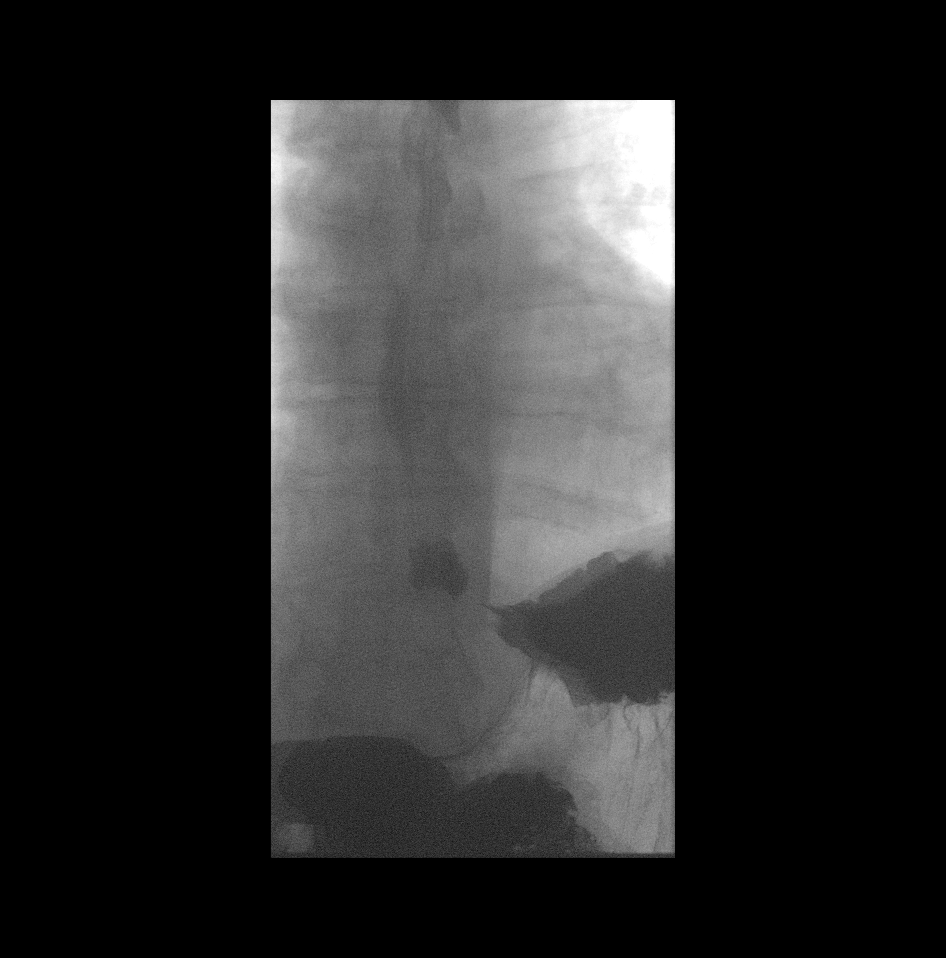

[14 of 19 positions shown; findings below may reference images not displayed]

FINDINGS: There was normal pharyngeal anatomy and motility. Contrast flowed
freely through the esophagus without evidence of stricture or mass.
Normal esophageal mucosa without evidence of irregularity or
ulceration. Esophageal motility was normal. Mild gastroesophageal
reflux. No definite hiatal hernia was demonstrated.

At the end of the examination a 13 mm barium tablet was administered
which transited through the esophagus and esophagogastric junction
without delay.
IMPRESSION: 1.  Mild gastroesophageal reflux.

## 2018-05-20 ENCOUNTER — Other Ambulatory Visit
Admission: RE | Admit: 2018-05-20 | Discharge: 2018-05-20 | Disposition: A | Discharge status: Home / Self Care | Payer: Medicare Other | Source: Ambulatory Visit | Attending: Ophthalmology | Admitting: Ophthalmology

## 2018-05-20 ENCOUNTER — Other Ambulatory Visit: Payer: Self-pay

## 2018-05-20 DIAGNOSIS — Z1159 Encounter for screening for other viral diseases: Secondary | ICD-10-CM | POA: Diagnosis not present

## 2018-05-21 LAB — NOVEL CORONAVIRUS, NAA (HOSP ORDER, SEND-OUT TO REF LAB; TAT 18-24 HRS): SARS-CoV-2, NAA: NOT DETECTED

## 2018-05-21 NOTE — Discharge Instructions (Signed)

## 2018-05-23 ENCOUNTER — Other Ambulatory Visit: Payer: Self-pay

## 2018-05-23 ENCOUNTER — Encounter: Payer: Self-pay | Admitting: *Deleted

## 2018-05-24 ENCOUNTER — Ambulatory Visit: Payer: Medicare Other | Admitting: Anesthesiology

## 2018-05-24 ENCOUNTER — Ambulatory Visit
Admission: RE | Admit: 2018-05-24 | Discharge: 2018-05-24 | Disposition: A | Discharge status: Home / Self Care | Payer: Medicare Other | Attending: Ophthalmology | Admitting: Ophthalmology

## 2018-05-24 ENCOUNTER — Encounter
Admission: RE | Disposition: A | Discharge status: Home / Self Care | Payer: Self-pay | Source: Home / Self Care | Attending: Ophthalmology

## 2018-05-24 DIAGNOSIS — Z8711 Personal history of peptic ulcer disease: Secondary | ICD-10-CM | POA: Insufficient documentation

## 2018-05-24 DIAGNOSIS — Z9049 Acquired absence of other specified parts of digestive tract: Secondary | ICD-10-CM | POA: Insufficient documentation

## 2018-05-24 DIAGNOSIS — Z888 Allergy status to other drugs, medicaments and biological substances status: Secondary | ICD-10-CM | POA: Diagnosis not present

## 2018-05-24 DIAGNOSIS — H9190 Unspecified hearing loss, unspecified ear: Secondary | ICD-10-CM | POA: Diagnosis not present

## 2018-05-24 DIAGNOSIS — E785 Hyperlipidemia, unspecified: Secondary | ICD-10-CM | POA: Insufficient documentation

## 2018-05-24 DIAGNOSIS — F329 Major depressive disorder, single episode, unspecified: Secondary | ICD-10-CM | POA: Diagnosis not present

## 2018-05-24 DIAGNOSIS — Z87898 Personal history of other specified conditions: Secondary | ICD-10-CM | POA: Diagnosis not present

## 2018-05-24 DIAGNOSIS — E1136 Type 2 diabetes mellitus with diabetic cataract: Secondary | ICD-10-CM | POA: Diagnosis not present

## 2018-05-24 DIAGNOSIS — M1990 Unspecified osteoarthritis, unspecified site: Secondary | ICD-10-CM | POA: Insufficient documentation

## 2018-05-24 DIAGNOSIS — I509 Heart failure, unspecified: Secondary | ICD-10-CM | POA: Diagnosis not present

## 2018-05-24 DIAGNOSIS — Z7984 Long term (current) use of oral hypoglycemic drugs: Secondary | ICD-10-CM | POA: Diagnosis not present

## 2018-05-24 DIAGNOSIS — E118 Type 2 diabetes mellitus with unspecified complications: Secondary | ICD-10-CM | POA: Insufficient documentation

## 2018-05-24 DIAGNOSIS — I252 Old myocardial infarction: Secondary | ICD-10-CM | POA: Insufficient documentation

## 2018-05-24 DIAGNOSIS — Z955 Presence of coronary angioplasty implant and graft: Secondary | ICD-10-CM | POA: Diagnosis not present

## 2018-05-24 DIAGNOSIS — Z87442 Personal history of urinary calculi: Secondary | ICD-10-CM | POA: Insufficient documentation

## 2018-05-24 DIAGNOSIS — H25811 Combined forms of age-related cataract, right eye: Secondary | ICD-10-CM | POA: Diagnosis not present

## 2018-05-24 DIAGNOSIS — G709 Myoneural disorder, unspecified: Secondary | ICD-10-CM | POA: Diagnosis not present

## 2018-05-24 DIAGNOSIS — G4730 Sleep apnea, unspecified: Secondary | ICD-10-CM | POA: Diagnosis not present

## 2018-05-24 DIAGNOSIS — E7800 Pure hypercholesterolemia, unspecified: Secondary | ICD-10-CM | POA: Insufficient documentation

## 2018-05-24 DIAGNOSIS — H2511 Age-related nuclear cataract, right eye: Secondary | ICD-10-CM | POA: Insufficient documentation

## 2018-05-24 DIAGNOSIS — G8929 Other chronic pain: Secondary | ICD-10-CM | POA: Diagnosis not present

## 2018-05-24 DIAGNOSIS — Z85828 Personal history of other malignant neoplasm of skin: Secondary | ICD-10-CM | POA: Insufficient documentation

## 2018-05-24 DIAGNOSIS — I110 Hypertensive heart disease with heart failure: Secondary | ICD-10-CM | POA: Insufficient documentation

## 2018-05-24 DIAGNOSIS — I2510 Atherosclerotic heart disease of native coronary artery without angina pectoris: Secondary | ICD-10-CM | POA: Insufficient documentation

## 2018-05-24 DIAGNOSIS — R05 Cough: Secondary | ICD-10-CM | POA: Diagnosis not present

## 2018-05-24 DIAGNOSIS — Z950 Presence of cardiac pacemaker: Secondary | ICD-10-CM | POA: Insufficient documentation

## 2018-05-24 DIAGNOSIS — Z86010 Personal history of colonic polyps: Secondary | ICD-10-CM | POA: Insufficient documentation

## 2018-05-24 HISTORY — PX: CATARACT EXTRACTION W/PHACO: SHX586

## 2018-05-24 LAB — GLUCOSE, CAPILLARY
Glucose-Capillary: 304 mg/dL — ABNORMAL HIGH (ref 70–99)
Glucose-Capillary: 260 mg/dL — ABNORMAL HIGH (ref 70–99)

## 2018-05-24 SURGERY — PHACOEMULSIFICATION, CATARACT, WITH IOL INSERTION
Anesthesia: Monitor Anesthesia Care | Site: Eye | Laterality: Right

## 2018-05-24 MED ORDER — BRIMONIDINE TARTRATE-TIMOLOL 0.2-0.5 % OP SOLN
OPHTHALMIC | Status: DC | PRN
  Administered 2018-05-24: 1 [drp] via OPHTHALMIC

## 2018-05-24 MED ORDER — LIDOCAINE HCL (PF) 2 % IJ SOLN
INTRAOCULAR | Status: DC | PRN
  Administered 2018-05-24: 1 mL

## 2018-05-24 MED ORDER — ARMC OPHTHALMIC DILATING DROPS
1.0000 "application " | OPHTHALMIC | Status: DC | PRN
  Administered 2018-05-24 (×3): 1 via OPHTHALMIC

## 2018-05-24 MED ORDER — NA CHONDROIT SULF-NA HYALURON 40-17 MG/ML IO SOLN
INTRAOCULAR | Status: DC | PRN
  Administered 2018-05-24: 1 mL via INTRAOCULAR

## 2018-05-24 MED ORDER — INSULIN LISPRO 100 UNIT/ML ~~LOC~~ SOLN
3.0000 [IU] | Freq: Once | SUBCUTANEOUS | Status: AC
  Administered 2018-05-24: 3 [IU] via SUBCUTANEOUS

## 2018-05-24 MED ORDER — TETRACAINE HCL 0.5 % OP SOLN
1.0000 [drp] | OPHTHALMIC | Status: DC | PRN
  Administered 2018-05-24 (×2): 1 [drp] via OPHTHALMIC

## 2018-05-24 MED ORDER — ACETAMINOPHEN 325 MG PO TABS: 325.0000 mg | ORAL_TABLET | Freq: Once | ORAL | Status: DC

## 2018-05-24 MED ORDER — ACETAMINOPHEN 160 MG/5ML PO SOLN: 325.0000 mg | Freq: Once | ORAL | Status: DC

## 2018-05-24 MED ORDER — LACTATED RINGERS IV SOLN: INTRAVENOUS | Status: DC

## 2018-05-24 MED ORDER — MIDAZOLAM HCL 2 MG/2ML IJ SOLN
INTRAMUSCULAR | Status: DC | PRN
  Administered 2018-05-24: 2 mg via INTRAVENOUS

## 2018-05-24 MED ORDER — FENTANYL CITRATE (PF) 100 MCG/2ML IJ SOLN
INTRAMUSCULAR | Status: DC | PRN
  Administered 2018-05-24: 50 ug via INTRAVENOUS

## 2018-05-24 MED ORDER — EPINEPHRINE PF 1 MG/ML IJ SOLN
INTRAOCULAR | Status: DC | PRN
  Administered 2018-05-24: 68 mL via OPHTHALMIC

## 2018-05-24 MED ORDER — MOXIFLOXACIN HCL 0.5 % OP SOLN
OPHTHALMIC | Status: DC | PRN
  Administered 2018-05-24: 0.2 mL via OPHTHALMIC

## 2018-05-24 SURGICAL SUPPLY — 19 items
CANNULA ANT/CHMB 27G (MISCELLANEOUS) ×1 IMPLANT
CANNULA ANT/CHMB 27GA (MISCELLANEOUS) ×3 IMPLANT
GLOVE SURG LX 8.0 MICRO (GLOVE) ×2
GLOVE SURG LX STRL 8.0 MICRO (GLOVE) ×1 IMPLANT
GLOVE SURG TRIUMPH 8.0 PF LTX (GLOVE) ×3 IMPLANT
GOWN STRL REUS W/ TWL LRG LVL3 (GOWN DISPOSABLE) ×2 IMPLANT
GOWN STRL REUS W/TWL LRG LVL3 (GOWN DISPOSABLE) ×6
LENS IOL TECNIS ITEC 21.0 (Intraocular Lens) ×2 IMPLANT
MARKER SKIN DUAL TIP RULER LAB (MISCELLANEOUS) ×3 IMPLANT
NDL FILTER BLUNT 18X1 1/2 (NEEDLE) ×1 IMPLANT
NDL RETROBULBAR .5 NSTRL (NEEDLE) ×3 IMPLANT
NEEDLE FILTER BLUNT 18X 1/2SAF (NEEDLE) ×2
NEEDLE FILTER BLUNT 18X1 1/2 (NEEDLE) ×1 IMPLANT
PACK EYE AFTER SURG (MISCELLANEOUS) ×3 IMPLANT
PACK OPTHALMIC (MISCELLANEOUS) ×3 IMPLANT
PACK PORFILIO (MISCELLANEOUS) ×3 IMPLANT
SYR 3ML LL SCALE MARK (SYRINGE) ×3 IMPLANT
SYR TB 1ML LUER SLIP (SYRINGE) ×3 IMPLANT
WIPE NON LINTING 3.25X3.25 (MISCELLANEOUS) ×3 IMPLANT

## 2018-05-24 NOTE — Transfer of Care (Signed)
Immediate Anesthesia Transfer of Care Note  Patient: Walter Salas  Procedure(s) Performed: CATARACT EXTRACTION PHACO AND INTRAOCULAR LENS PLACEMENT ([facility]) RIGHT (Right Eye)  Patient Location: PACU  Anesthesia Type: MAC  Level of Consciousness: awake, alert  and patient cooperative  Airway and Oxygen Therapy: Patient Spontanous Breathing and Patient connected to supplemental oxygen  Post-op Assessment: Post-op Vital signs reviewed, Patient's Cardiovascular Status Stable, Respiratory Function Stable, Patent Airway and No signs of Nausea or vomiting  Post-op Vital Signs: Reviewed and stable  Complications: No apparent anesthesia complications

## 2018-05-24 NOTE — Op Note (Signed)
PREOPERATIVE DIAGNOSIS:  Nuclear sclerotic cataract of the right eye.   POSTOPERATIVE DIAGNOSIS:  H25.11  CATARACT   OPERATIVE PROCEDURE: Procedure(s): CATARACT EXTRACTION PHACO AND INTRAOCULAR LENS PLACEMENT ([place]) RIGHT   SURGEON:  Birder Robson, MD.   ANESTHESIA:  Anesthesiologist: Ronelle Nigh, MD CRNA: Janna Arch, CRNA  1.      Managed anesthesia care. 2.      0.80ml of Shugarcaine was instilled in the eye following the paracentesis.   COMPLICATIONS:  None.   TECHNIQUE:   Stop and chop   DESCRIPTION OF PROCEDURE:  The patient was examined and consented in the preoperative holding area where the aforementioned topical anesthesia was applied to the right eye and then brought back to the Operating Room where the right eye was prepped and draped in the usual sterile ophthalmic fashion and a lid speculum was placed. A paracentesis was created with the side port blade and the anterior chamber was filled with viscoelastic. A near clear corneal incision was performed with the steel keratome. A continuous curvilinear capsulorrhexis was performed with a cystotome followed by the capsulorrhexis forceps. Hydrodissection and hydrodelineation were carried out with BSS on a blunt cannula. The lens was removed in a stop and chop  technique and the remaining cortical material was removed with the irrigation-aspiration handpiece. The capsular bag was inflated with viscoelastic and the Technis [id number]  lens was placed in the capsular bag without complication. The remaining viscoelastic was removed from the eye with the irrigation-aspiration handpiece. The wounds were hydrated. The anterior chamber was flushed with Miostat and the eye was inflated to physiologic pressure. 0.58ml of Vigamox was placed in the anterior chamber. The wounds were found to be water tight. The eye was dressed with Vigamox. The patient was given protective glasses to wear throughout the day and a shield with which to sleep  tonight. The patient was also given drops with which to begin a drop regimen today and will follow-up with me in one day. Implant Name Type Inv. Item Serial No. Manufacturer Lot No. LRB No. Used  LENS IOL DIOP 21.0 - [id number] Intraocular Lens LENS IOL DIOP 21.0 [id number]  Right 1   Procedure(s) with comments: CATARACT EXTRACTION PHACO AND INTRAOCULAR LENS PLACEMENT ([facility]) RIGHT (Right) - Diabetic - oral meds  Electronically signed: Birder Robson 05/24/2018 10:53 AM

## 2018-05-24 NOTE — Anesthesia Preprocedure Evaluation (Signed)
Anesthesia Evaluation  Patient identified by MRN, date of birth, ID band Patient awake    Reviewed: Allergy & Precautions, H&P , NPO status , Patient's Chart, lab work & pertinent test results  History of Anesthesia Complications Negative for: history of anesthetic complications  Airway Mallampati: III  TM Distance: <3 FB Neck ROM: limited    Dental no notable dental hx. (+) Chipped, Poor Dentition, Missing   Pulmonary neg shortness of breath, sleep apnea , COPD, Current Smoker, former smoker,    Pulmonary exam normal breath sounds clear to auscultation       Cardiovascular Exercise Tolerance: Good hypertension, (-) angina+ CAD, + Past MI, + Peripheral Vascular Disease and +CHF  (-) DOE Normal cardiovascular exam Rhythm:regular Rate:Normal     Neuro/Psych PSYCHIATRIC DISORDERS Depression  Neuromuscular disease    GI/Hepatic negative GI ROS, Neg liver ROS, neg GERD  ,  Endo/Other  diabetes, Type 2, Oral Hypoglycemic Agents  Renal/GU Renal disease  negative genitourinary   Musculoskeletal  (+) Arthritis , Osteoarthritis,    Abdominal   Peds  Hematology negative hematology ROS (+)   Anesthesia Other Findings Past Medical History: No date: Arthritis No date: CAD (coronary artery disease) No date: CAD (coronary artery disease) No date: Chronic pain No date: Colon polyp No date: Depression No date: Diabetes mellitus without complication ([place]) 6433: Hx of laminectomy No date: Hyperlipidemia No date: Hypertension No date: Kidney stone No date: Ulcer  Past Surgical History: 1993, 1994, 1997, 1998: BACK SURGERY 1990: CARPAL TUNNEL RELEASE; Bilateral 2000. 2003: CERVICAL FUSION 2000: CHOLECYSTECTOMY 2014: CORONARY ANGIOPLASTY WITH STENT PLACEMENT     Comment[facility] [place], [facility] 2012: SHOULDER ARTHROSCOPY; Right 2007: TUMOR EXCISION  BMI    Body Mass Index:  30.71 kg/m      Reproductive/Obstetrics negative OB ROS                             Anesthesia Physical  Anesthesia Plan  ASA: III  Anesthesia Plan: MAC   Post-op Pain Management:    Induction: Intravenous  PONV Risk Score and Plan: 1 and Midazolam and Treatment may vary due to age or medical condition  Airway Management Planned: Nasal Cannula  Additional Equipment:   Intra-op Plan:   Post-operative Plan:   Informed Consent: I have reviewed the patients History and Physical, chart, labs and discussed the procedure including the risks, benefits and alternatives for the proposed anesthesia with the patient or authorized representative who has indicated his/her understanding and acceptance.     Dental Advisory Given  Plan Discussed with: Anesthesiologist, CRNA and Surgeon  Anesthesia Plan Comments: (Patient consented for risks of anesthesia including but not limited to:  - adverse reactions to medications - risk of intubation if required - damage to teeth, lips or other oral mucosa - sore throat or hoarseness - Damage to heart, brain, lungs or loss of life  Patient voiced understanding.)        Anesthesia Quick Evaluation

## 2018-05-24 NOTE — H&P (Signed)
All labs reviewed. Abnormal studies sent to patients PCP when indicated.  Previous H&P reviewed, patient examined, there are NO CHANGES.  Walter Moosman Porfilio5/12/20209:48 AM

## 2018-05-24 NOTE — Anesthesia Procedure Notes (Signed)
Procedure Name: MAC Date/Time: 05/24/2018 10:27 AM Performed by: Janna Arch, CRNA Pre-anesthesia Checklist: Patient identified, Emergency Drugs available, Suction available, Timeout performed and Patient being monitored Patient Re-evaluated:Patient Re-evaluated prior to induction Oxygen Delivery Method: Nasal cannula Placement Confirmation: positive ETCO2

## 2018-05-24 NOTE — Anesthesia Postprocedure Evaluation (Signed)
Anesthesia Post Note  Patient: Walter Salas  Procedure(s) Performed: CATARACT EXTRACTION PHACO AND INTRAOCULAR LENS PLACEMENT (IOC) RIGHT (Right Eye)  Patient location during evaluation: PACU Anesthesia Type: MAC Level of consciousness: awake and alert and oriented Pain management: satisfactory to patient Vital Signs Assessment: post-procedure vital signs reviewed and stable Respiratory status: spontaneous breathing, nonlabored ventilation and respiratory function stable Cardiovascular status: blood pressure returned to baseline and stable Postop Assessment: Adequate PO intake and No signs of nausea or vomiting Anesthetic complications: no    Raliegh Ip

## 2018-05-25 ENCOUNTER — Encounter: Payer: Self-pay | Admitting: Ophthalmology

## 2018-05-27 ENCOUNTER — Telehealth: Payer: Self-pay | Admitting: Family Medicine

## 2018-05-27 NOTE — Telephone Encounter (Signed)
Shelton Silvas [phone] from [employer] called regarding if a fax was received for pt to have statin to be accessed? Please advise?   Please call for more information. Thanks

## 2018-06-03 NOTE — Telephone Encounter (Signed)
I can not recall if this has been fax or not but is the pt need to be on a statin or has he eclined to take a statins?

## 2018-06-07 NOTE — Telephone Encounter (Signed)
The patient has Lipitor on his medication list.  Please contact him to see if he has been taking this.  If he has been taking this please contact Shelton Silvas from [place] [facility] to let them know.  The number is in the initial message.  Thanks.

## 2018-06-15 MED ORDER — TRAMADOL HCL 50 MG PO TABS: 100.0000 mg | ORAL_TABLET | Freq: Three times a day (TID) | ORAL | 0 refills | Status: DC | PRN

## 2018-06-15 NOTE — Telephone Encounter (Signed)
Called and left a message for Bridgett informing her that the patient is on Lipitor and is taking it daily, also the pt stated he needs a refill on tramadol so be sent to his pharmacy.  Nina,cma

## 2018-06-15 NOTE — Addendum Note (Signed)
Addended by: Caryl Bis, ERIC G on: 06/15/2018 12:59 PM   Modules accepted: Orders

## 2018-06-15 NOTE — Telephone Encounter (Signed)
Noted. Tramadol sent to pharmacy.  

## 2018-06-16 NOTE — Telephone Encounter (Signed)
Called and left a message with the wife informing her that the pt's medication was sent to pharmacy.  Dwight Burdo,cma

## 2018-06-22 ENCOUNTER — Ambulatory Visit: Payer: Medicare Other

## 2018-06-22 ENCOUNTER — Telehealth: Payer: Self-pay

## 2018-06-22 ENCOUNTER — Other Ambulatory Visit: Payer: Self-pay

## 2018-06-22 NOTE — Telephone Encounter (Signed)
Called patient at appointed time for visit. No answer. Left a message with my direct dial to call back today and complete wellness visit.

## 2018-06-23 DIAGNOSIS — Z961 Presence of intraocular lens: Secondary | ICD-10-CM | POA: Diagnosis not present

## 2018-06-29 ENCOUNTER — Encounter: Payer: Self-pay | Admitting: Family Medicine

## 2018-06-29 ENCOUNTER — Ambulatory Visit (INDEPENDENT_AMBULATORY_CARE_PROVIDER_SITE_OTHER): Payer: Medicare Other | Admitting: Family Medicine

## 2018-06-29 ENCOUNTER — Other Ambulatory Visit: Payer: Self-pay

## 2018-06-29 VITALS — BP 110/60 | HR 79 | Temp 98.1°F | Ht 70.0 in | Wt 234.0 lb

## 2018-06-29 DIAGNOSIS — Z125 Encounter for screening for malignant neoplasm of prostate: Secondary | ICD-10-CM | POA: Diagnosis not present

## 2018-06-29 DIAGNOSIS — E785 Hyperlipidemia, unspecified: Secondary | ICD-10-CM | POA: Diagnosis not present

## 2018-06-29 DIAGNOSIS — E041 Nontoxic single thyroid nodule: Secondary | ICD-10-CM | POA: Diagnosis not present

## 2018-06-29 DIAGNOSIS — Z0001 Encounter for general adult medical examination with abnormal findings: Secondary | ICD-10-CM | POA: Diagnosis not present

## 2018-06-29 DIAGNOSIS — R911 Solitary pulmonary nodule: Secondary | ICD-10-CM

## 2018-06-29 DIAGNOSIS — Z87891 Personal history of nicotine dependence: Secondary | ICD-10-CM

## 2018-06-29 DIAGNOSIS — E119 Type 2 diabetes mellitus without complications: Secondary | ICD-10-CM

## 2018-06-29 DIAGNOSIS — L989 Disorder of the skin and subcutaneous tissue, unspecified: Secondary | ICD-10-CM

## 2018-06-29 DIAGNOSIS — I10 Essential (primary) hypertension: Secondary | ICD-10-CM | POA: Diagnosis not present

## 2018-06-29 LAB — HEMOGLOBIN A1C: Hgb A1c MFr Bld: 8.8 % — ABNORMAL HIGH (ref 4.6–6.5)

## 2018-06-29 LAB — TSH: TSH: 0.86 u[IU]/mL (ref 0.35–4.50)

## 2018-06-29 LAB — COMPREHENSIVE METABOLIC PANEL
ALT: 15 U/L (ref 0–53)
AST: 12 U/L (ref 0–37)
Albumin: 4.5 g/dL (ref 3.5–5.2)
Alkaline Phosphatase: 64 U/L (ref 39–117)
BUN: 13 mg/dL (ref 6–23)
CO2: 30 mEq/L (ref 19–32)
Calcium: 9.2 mg/dL (ref 8.4–10.5)
Chloride: 95 mEq/L — ABNORMAL LOW (ref 96–112)
Creatinine, Ser: 0.7 mg/dL (ref 0.40–1.50)
GFR: 113.31 mL/min (ref >60.00)
Glucose, Bld: 162 mg/dL — ABNORMAL HIGH (ref 70–99)
Potassium: 3.9 mEq/L (ref 3.5–5.1)
Sodium: 134 mEq/L — ABNORMAL LOW (ref 135–145)
Total Bilirubin: 0.5 mg/dL (ref 0.2–1.2)
Total Protein: 6.7 g/dL (ref 6.0–8.3)

## 2018-06-29 LAB — LDL CHOLESTEROL, DIRECT: Direct LDL: 71 mg/dL

## 2018-06-29 LAB — PSA, MEDICARE: PSA: 0.23 ng/ml (ref 0.10–4.00)

## 2018-06-29 LAB — MICROALBUMIN / CREATININE URINE RATIO
Creatinine,U: 93.3 mg/dL
Microalb Creat Ratio: 8.5 mg/g (ref 0.0–30.0)
Microalb, Ur: 8 mg/dL — ABNORMAL HIGH (ref 0.0–1.9)

## 2018-06-29 NOTE — Progress Notes (Signed)
Tommi Rumps, MD Phone: [phone]  Walter Salas is a 63 y.o. male who presents today for CPE.  Exercises by walking about a mile a day when it is not raining. Diet consists of a little bit of everything.  He does avoid fried foods and sweets.  He has one diet soda per day. Colon cancer screening is up-to-date. No family history of colon cancer or prostate cancer. Tetanus vaccine and pneumonia vaccine up-to-date.  Due for Shingrix. He does report having an HIV screening test previously. He is a former smoker.  He smoked about 40 years 1 pack/day.  He quit 3 to 4 months ago.  No alcohol use.  No illicit drug use. Does not see a dentist.  He sees an ophthalmologist once yearly.  Skin lesion: Patient does note a skin lesion that is noted over the past year that is on his right forearm.  Has not changed in size or color since he cannot.  He does follow-up dermatology though has not seen them in some time.  Active Ambulatory Problems    Diagnosis Date Noted  . History of subdural hematoma 12/14/2012  . Dorsal (thoracic) vertebral fracture ([place]) 12/15/2012  . Diabetes mellitus, type 2 ([place]) 04/28/2013  . Essential hypertension 04/28/2013  . Coronary artery disease 04/28/2013  . Hyperlipidemia LDL goal <70 04/28/2013  . Hereditary and idiopathic peripheral neuropathy 08/14/2013  . Unspecified vitamin D deficiency 08/14/2013  . Diastasis recti 03/21/2014  . Knee pain, bilateral 11/15/2014  . Chronic back pain 05/15/2015  . Nodule of finger of left hand 05/15/2015  . Onychomycosis 02/11/2016  . Syncope 04/20/2016  . Urinary incontinence 04/20/2016  . Dilated aortic root ([place]) 06/23/2016  . Thyroid nodule 11/11/2016  . History of colon polyps 11/11/2016  . Encounter for general adult medical examination with abnormal findings 03/26/2017  . B12 deficiency 03/26/2017  . H. pylori infection 03/28/2017  . Rib pain on left side 04/20/2017  . Tobacco abuse 06/28/2017  . Obesity (BMI  30.0-34.9) 10/21/2017  . Thoracic aortic aneurysm without rupture ([place]) 01/24/2018  . Pericardial effusion 01/24/2018  . History of syncope 01/24/2018  . Lung nodule 02/22/2018  . Skin lesion 06/29/2018   Resolved Ambulatory Problems    Diagnosis Date Noted  . Back ache 12/15/2012  . Chest wall pain 04/28/2013  . Plantar fasciitis of left foot 06/07/2013  . Ventral hernia 02/23/2014  . Acute purulent otitis media 05/15/2014  . Medicare annual wellness visit, initial 10/24/2014  . Diabetic eye exam ([place]) 10/24/2014  . Screening for skin cancer 10/24/2014  . Sinusitis, acute maxillary 01/30/2015  . Abdominal pain, right upper quadrant 11/15/2015  . De Quervain's disease (radial styloid tenosynovitis) 02/11/2016  . Strain of masseter muscle 10/21/2016   Past Medical History:  Diagnosis Date  . Agent orange exposure   . Anginal pain ([place])   . Arthritis   . CAD (coronary artery disease)   . Cancer ([place])   . CHF (congestive heart failure) ([place])   . Chronic cough   . Chronic pain   . Colon polyp   . Depression   . Diabetes mellitus without complication ([place])   . Diverticulosis   . History of kidney stones   . History of stomach ulcers   . HOH (hard of hearing)   . Hx of laminectomy 1999  . Hyperlipidemia   . Hypertension   . Kidney stone   . Myocardial infarction ([place])   . Sleep apnea   . Thoracic aortic aneurysm (  [place])   . Ulcer     Family History  Problem Relation Age of Onset  . Diabetes Mother   . Heart disease Father   . Diabetes Father   . Diabetes Sister   . Heart disease Sister   . Diabetes Brother   . Heart disease Brother   . Heart disease Paternal Grandfather     Social History   Socioeconomic History  . Marital status: Married    Spouse name: Not on file  . Number of children: Not on file  . Years of education: Not on file  . Highest education level: Not on file  Occupational History  . Not on file  Social Needs  . Financial resource strain:  Not hard at all  . Food insecurity    Worry: Never true    Inability: Never true  . Transportation needs    Medical: No    Non-medical: No  Tobacco Use  . Smoking status: Former Smoker    Packs/day: 1.50    Years: 50.00    Pack years: 75.00    Types: Cigarettes    Quit date: 02/15/2018    Years since quitting: 0.3  . Smokeless tobacco: Never Used  Substance and Sexual Activity  . Alcohol use: No  . Drug use: No  . Sexual activity: Never  Lifestyle  . Physical activity    Days per week: Not on file    Minutes per session: Not on file  . Stress: Not on file  Relationships  . Social Herbalist on phone: Not on file    Gets together: Not on file    Attends religious service: Not on file    Active member of club or organization: Not on file    Attends meetings of clubs or organizations: Not on file    Relationship status: Not on file  . Intimate partner violence    Fear of current or ex partner: No    Emotionally abused: No    Physically abused: No    Forced sexual activity: No  Other Topics Concern  . Not on file  Social History Narrative   Lives in [place] with wife. Has cat.   Work - Database administrator, retired      Followed at [employer].      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      Diet - Regular   Exercise - starting back at gym    ROS  General:  Negative for nexplained weight loss, fever Skin: Positive for new or changing mole, negative sore that won't heal HEENT: Negative for trouble hearing, trouble seeing, ringing in ears, mouth sores, hoarseness, change in voice, dysphagia. CV:  Negative for chest pain, dyspnea, edema, palpitations Resp: Negative for cough, dyspnea, hemoptysis GI: Negative for nausea, vomiting, diarrhea, constipation, abdominal pain, melena, hematochezia. GU: Negative for dysuria, incontinence, urinary hesitance, hematuria, vaginal or penile discharge, polyuria, sexual difficulty, lumps in testicle or  breasts MSK: Negative for muscle cramps or aches, joint pain or swelling Neuro: Negative for headaches, weakness, numbness, dizziness, passing out/fainting Psych: Negative for depression, anxiety, memory problems  Objective  Physical Exam Vitals:   06/29/18 0812  BP: 110/60  Pulse: 79  Temp: 98.1 F (36.7 C)  SpO2: 96%    BP Readings from Last 3 Encounters:  06/29/18 110/60  05/24/18 139/86  04/20/18 122/70   Wt Readings from Last 3 Encounters:  06/29/18 234 lb (106.1 kg)  05/24/18 234  lb 8 oz (106.4 kg)  04/20/18 230 lb (104.3 kg)    Physical Exam Constitutional:      General: He is not in acute distress.    Appearance: He is not diaphoretic.  HENT:     Mouth/Throat:     Mouth: Mucous membranes are moist.     Pharynx: Oropharynx is clear.  Eyes:     Conjunctiva/sclera: Conjunctivae normal.     Pupils: Pupils are equal, round, and reactive to light.  Cardiovascular:     Rate and Rhythm: Normal rate and regular rhythm.     Heart sounds: Normal heart sounds.  Pulmonary:     Effort: Pulmonary effort is normal.     Breath sounds: Normal breath sounds.  Abdominal:     General: Bowel sounds are normal. There is no distension.     Palpations: Abdomen is soft. There is no mass.     Tenderness: There is no abdominal tenderness.  Musculoskeletal:     Right lower leg: No edema.     Left lower leg: No edema.  Lymphadenopathy:     Cervical: No cervical adenopathy.  Skin:    General: Skin is warm and dry.       Neurological:     General: No focal deficit present.     Mental Status: He is alert.  Psychiatric:        Mood and Affect: Mood normal.    Diabetic Foot Exam - Simple   Simple Foot Form Diabetic Foot exam was performed with the following findings: Yes 06/29/2018  8:24 AM  Visual Inspection See comments: Yes Sensation Testing Intact to touch and monofilament testing bilaterally: Yes Pulse Check Posterior Tibialis and Dorsalis pulse intact bilaterally:  Yes Comments Onychomycosis noted bilaterally, no other deformities, ulcerations, or skin breakdown      Assessment/Plan:   Encounter for general adult medical examination with abnormal findings Physical exam completed.  Encouraged decreasing soda intake.  He will continue exercise and monitoring his diet.  Discussed Shingrix vaccine he will go to the pharmacy to complete this.  PSA will be obtained for prostate cancer screening.  Congratulated him on quitting smoking.  Lab work as outlined below.  He will continue to have CT angiograms to evaluate his dilated thoracic aorta and this will continue to follow his lung nodule.  Lung nodule He will continue to have yearly CT angiograms to follow his dilated thoracic aorta and this will continue to follow his lung nodule.  Skin lesion Refer to dermatology.   Orders Placed This Encounter  Procedures  . PSA, Medicare ( [place] only)  . Comp Met (CMET)  . Direct LDL  . HgB A1c  . Urine Microalbumin w/creat. ratio  . TSH  . Ambulatory referral to Dermatology    Referral Priority:   Routine    Referral Type:   Consultation    Referral Reason:   Specialty Services Required    Requested Specialty:   Dermatology    Number of Visits Requested:   1    No orders of the defined types were placed in this encounter.    Tommi Rumps, MD [place]

## 2018-06-29 NOTE — Assessment & Plan Note (Signed)
Physical exam completed.  Encouraged decreasing soda intake.  He will continue exercise and monitoring his diet.  Discussed Shingrix vaccine he will go to the pharmacy to complete this.  PSA will be obtained for prostate cancer screening.  Congratulated him on quitting smoking.  Lab work as outlined below.  He will continue to have CT angiograms to evaluate his dilated thoracic aorta and this will continue to follow his lung nodule.

## 2018-06-29 NOTE — Patient Instructions (Addendum)
Nice to see you. Please continue to walk.  Please try to decrease your soda intake. Please check with your pharmacy regarding the Shingrix vaccine. Please continue to have your yearly CT scans to follow-up on your lung nodule and dilated aorta. I have referred you to dermatology as well.  If you do not hear anything from them please let us know.

## 2018-06-29 NOTE — Assessment & Plan Note (Signed)
Refer to dermatology 

## 2018-06-29 NOTE — Assessment & Plan Note (Signed)
He will continue to have yearly CT angiograms to follow his dilated thoracic aorta and this will continue to follow his lung nodule.

## 2018-06-30 ENCOUNTER — Encounter: Payer: Self-pay | Admitting: *Deleted

## 2018-07-04 ENCOUNTER — Telehealth: Payer: Self-pay | Admitting: Family Medicine

## 2018-07-04 ENCOUNTER — Telehealth: Payer: Self-pay

## 2018-07-04 ENCOUNTER — Other Ambulatory Visit: Payer: Self-pay

## 2018-07-04 ENCOUNTER — Ambulatory Visit (INDEPENDENT_AMBULATORY_CARE_PROVIDER_SITE_OTHER): Payer: Medicare Other

## 2018-07-04 DIAGNOSIS — Z0000 Encounter for general adult medical examination without abnormal findings: Secondary | ICD-10-CM | POA: Diagnosis not present

## 2018-07-04 NOTE — Telephone Encounter (Signed)
Called patient at scheduled time for annual wellness visit with health coach.  No answer. Left a message with my direct dial phone number for a call back or reschedule.

## 2018-07-04 NOTE — Patient Instructions (Addendum)
  [name] , Thank you for taking time to come for your Medicare Wellness Visit. I appreciate your ongoing commitment to your health goals. Please review the following plan we discussed and let me know if I can assist you in the future.   These are the goals we discussed: Goals      Patient Stated   . Follow up with Primary Care Provider (pt-stated)     Lower A1c Take all medications as directed Healthy diet Walk more for exercise         This is a list of the screening recommended for you and due dates:  Health Maintenance  Topic Date Due  . Flu Shot  08/13/2018  . Eye exam for diabetics  12/14/2018  . Hemoglobin A1C  12/29/2018  . Complete foot exam   06/29/2019  . Colon Cancer Screening  02/23/2022  . Tetanus Vaccine  04/29/2023  .  Hepatitis C: One time screening is recommended by Center for Disease Control  (CDC) for  adults born from [birth year] through 1967.   Completed

## 2018-07-04 NOTE — Progress Notes (Signed)
Subjective:   Walter Salas is a 63 y.o. male who presents for Medicare Annual/Subsequent preventive examination.  Review of Systems:  No ROS.  Medicare Wellness Virtual Visit.  Visual/audio telehealth visit, UTA vital signs.   See social history for additional risk factors.   Cardiac Risk Factors include: advanced age (>70men, >23 women);male gender;diabetes mellitus     Objective:    Vitals: There were no vitals taken for this visit.  There is no height or weight on file to calculate BMI.  Advanced Directives 07/04/2018 05/24/2018 04/21/2017 02/23/2017 04/20/2016  Does Patient Have a Medical Advance Directive? Yes Yes Yes Yes Yes  Type of Paramedic of [place];Living will [place];Living will [place];Living will [place];Living will Living will  Does patient want to make changes to medical advance directive? No - Patient declined - No - Patient declined - No - Patient declined  Copy of [place] in Chart? No - copy requested Yes - validated most recent copy scanned in chart (See row information) No - copy requested No - copy requested -    Tobacco Social History   Tobacco Use  Smoking Status Former Smoker  . Packs/day: 1.50  . Years: 50.00  . Pack years: 75.00  . Types: Cigarettes  . Quit date: 02/15/2018  . Years since quitting: 0.3  Smokeless Tobacco Never Used     Counseling given: Not Answered   Clinical Intake:  Pre-visit preparation completed: Yes        Diabetes: Yes(Followed by pcp)  How often do you need to have someone help you when you read instructions, pamphlets, or other written materials from your doctor or pharmacy?: 1 - Never  Interpreter Needed?: No     Past Medical History:  Diagnosis Date  . Agent orange exposure   . Anginal pain ([place])   . Arthritis   . CAD (coronary artery disease)    a. s/p PCI/DES to the LCx and RCA in 2014  .  Cancer ([place])    skin  . CHF (congestive heart failure) ([place])   . Chronic cough   . Chronic pain   . Colon polyp   . Depression   . Diabetes mellitus without complication ([place])   . Diverticulosis   . History of kidney stones   . History of stomach ulcers   . HOH (hard of hearing)   . Hx of laminectomy 1999  . Hyperlipidemia   . Hypertension   . Kidney stone   . Myocardial infarction ([place])   . Sleep apnea   . Syncope    a. Event monitor 2018: NSR with first-degree AV block, no significant arrhythmias or pauses; b.  Myoview showed a small in size, mild in severity fixed basal and anterior lateral defect, no ischemia, LVEF 55 to 65%, low risk study  . Thoracic aortic aneurysm ([place])    a. TTE 2018: EF 60-65%, normal wall motion, Gr1DD, mild aortic valve calcification, mildly dilated aortic root measuring up to 4.2 cm, normal RV cavity size and systolic function, normal PASP; b. CTA aorta 6/18:showed an ascending thoracic aortic aneurysmal dilatation with a maximum diameter of 4.3 cm  . Ulcer    Past Surgical History:  Procedure Laterality Date  . [place]  . CARPAL TUNNEL RELEASE Bilateral 1990  . CATARACT EXTRACTION W/PHACO Left 03/08/2018   Procedure: CATARACT EXTRACTION PHACO AND INTRAOCULAR LENS PLACEMENT (IOC) LEFT;  Surgeon: Birder Robson, MD;  Location: [facility] ORS;  Service: Ophthalmology;  Laterality: Left;  [place]  00:24.2 CDE 3.25 Fluid pack lot # [id number]  . CATARACT EXTRACTION W/PHACO Right 05/24/2018   Procedure: CATARACT EXTRACTION PHACO AND INTRAOCULAR LENS PLACEMENT (IOC) RIGHT;  Surgeon: Birder Robson, MD;  Location: [place];  Service: Ophthalmology;  Laterality: Right;  Diabetic - oral meds  . CERVICAL FUSION  2000. 2003  . CHOLECYSTECTOMY  2000  . COLONOSCOPY WITH PROPOFOL N/A 02/23/2017   Procedure: COLONOSCOPY WITH PROPOFOL;  Surgeon: Lollie Sails, MD;  Location[facility] ENDOSCOPY;  Service: Endoscopy;  Laterality: N/A;   . CORONARY ANGIOPLASTY WITH STENT PLACEMENT  [facility]   [facility] [employer]  . ESOPHAGOGASTRODUODENOSCOPY (EGD) WITH PROPOFOL N/A 02/23/2017   Procedure: ESOPHAGOGASTRODUODENOSCOPY (EGD) WITH PROPOFOL;  Surgeon: Lollie Sails, MD;  Location: [facility] ENDOSCOPY;  Service: Endoscopy;  Laterality: N/A;  . SHOULDER ARTHROSCOPY Right 2012  . TUMOR EXCISION  2007   Family History  Problem Relation Age of Onset  . Diabetes Mother   . Heart disease Father   . Diabetes Father   . Diabetes Sister   . Heart disease Sister   . Diabetes Brother   . Heart disease Brother   . Heart disease Paternal Grandfather    Social History   Socioeconomic History  . Marital status: Married    Spouse name: Not on file  . Number of children: Not on file  . Years of education: Not on file  . Highest education level: Not on file  Occupational History  . Not on file  Social Needs  . Financial resource strain: Not hard at all  . Food insecurity    Worry: Never true    Inability: Never true  . Transportation needs    Medical: No    Non-medical: No  Tobacco Use  . Smoking status: Former Smoker    Packs/day: 1.50    Years: 50.00    Pack years: 75.00    Types: Cigarettes    Quit date: 02/15/2018    Years since quitting: 0.3  . Smokeless tobacco: Never Used  Substance and Sexual Activity  . Alcohol use: No  . Drug use: No  . Sexual activity: Never  Lifestyle  . Physical activity    Days per week: 2 days    Minutes per session: 20 min  . Stress: Not at all  Relationships  . Social Herbalist on phone: Not on file    Gets together: Not on file    Attends religious service: Not on file    Active member of club or organization: Not on file    Attends meetings of clubs or organizations: Not on file    Relationship status: Not on file  Other Topics Concern  . Not on file  Social History Narrative   Lives in [place] with wife. Has cat.   Work - Psychologist, occupational, retired      Followed at [employer].      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      Diet - Regular   Exercise - starting back at gym    Outpatient Encounter Medications as of 07/04/2018  Medication Sig  . amLODipine (NORVASC) 2.5 MG tablet Take 1 tablet (2.5 mg total) by mouth daily.  Marland Kitchen aspirin 81 MG tablet Take 81 mg by mouth daily.  Marland Kitchen atorvastatin (LIPITOR) 80 MG tablet Take 80 mg by  mouth at bedtime.  . CHANTIX 1 MG tablet TAKE 1 TABLET BY MOUTH TWICE A DAY  . chlorthalidone (HYGROTON) 50 MG tablet Take 25 mg by mouth daily.  . cholecalciferol (VITAMIN D) 1000 UNITS tablet Take 1 tablet (1,000 Units total) by mouth QID. (Patient taking differently: Take 4,000 Units by mouth daily. )  . DULoxetine (CYMBALTA) 20 MG capsule Take 20 mg by mouth daily.  . fenofibrate (TRICOR) 145 MG tablet Take 1 tablet (145 mg total) by mouth daily.  Marland Kitchen glucose blood test strip Check once daily, E11.9  . JANUVIA 100 MG tablet TAKE 1 TABLET BY MOUTH EVERY DAY  . lidocaine (LIDODERM) 5 % Place 1 patch onto the skin daily. Remove & Discard patch within 12 hours or as directed by MD (Patient taking differently: Place 1 patch onto the skin daily as needed (pain). Remove & Discard patch within 12 hours or as directed by MD)  . losartan (COZAAR) 25 MG tablet Take 25 mg by mouth daily.  . metFORMIN (GLUCOPHAGE) 1000 MG tablet Take 1,000 mg by mouth 2 (two) times daily with a meal.  . methocarbamol (ROBAXIN) 500 MG tablet Take 500 mg by mouth 2 (two) times daily as needed for muscle spasms.   . methyl salicylate liquid Apply 1 application topically as needed for muscle pain.  . nitroGLYCERIN (NITROSTAT) 0.4 MG SL tablet Place 1 tablet (0.4 mg total) under the tongue every 5 (five) minutes as needed for chest pain. May take up to 3 doses.  Marland Kitchen omeprazole (PRILOSEC) 20 MG capsule Take 20 mg by mouth daily.  . polyvinyl alcohol (LIQUIFILM TEARS) 1.4 % ophthalmic solution Place 1 drop into both eyes  as needed for dry eyes.   . potassium chloride SA (K-DUR,KLOR-CON) 20 MEQ tablet Take 1 tablet (20 mEq total) by mouth daily.  . pregabalin (LYRICA) 50 MG capsule Take 50 mg by mouth 3 (three) times daily.  . traMADol (ULTRAM) 50 MG tablet Take 2 tablets (100 mg total) by mouth every 8 (eight) hours as needed for moderate pain.  . traZODone (DESYREL) 100 MG tablet Take 100 mg by mouth at bedtime as needed for sleep.  . vitamin B-12 (CYANOCOBALAMIN) 1000 MCG tablet Take 1 tablet (1,000 mcg total) by mouth daily.   No facility-administered encounter medications on file as of 07/04/2018.     Activities of Daily Living In your present state of health, do you have any difficulty performing the following activities: 07/04/2018 05/24/2018  Hearing? Y N  Comment Hearing aids -  Vision? N N  Difficulty concentrating or making decisions? N N  Walking or climbing stairs? N N  Comment Walking stick/cane in use when ambulating -  Dressing or bathing? N N  Doing errands, shopping? N -  Preparing Food and eating ? N -  Using the Toilet? N -  In the past six months, have you accidently leaked urine? N -  Do you have problems with loss of bowel control? N -  Managing your Medications? N -  Managing your Finances? N -  Housekeeping or managing your Housekeeping? N -  Some recent data might be hidden    Patient Care Team: Leone Haven, MD as PCP - General (Family Medicine) End, Harrell Gave, MD as PCP - Cardiology (Cardiology) Jackolyn Confer, MD (Internal Medicine) Bary Castilla Forest Gleason, MD (General Surgery)   Assessment:   This is a routine wellness examination for [place].  I connected with patient 07/04/18 at 10:30 AM EDT by an  audio enabled telemedicine application and verified that I am speaking with the correct person using two identifiers. Patient stated full name and DOB. Patient gave permission to continue with virtual visit. Patient's location was at home and Nurse's location was at  [place] office.   Health Screenings  Colonoscopy - 02/2017 Glaucoma -none Hearing -demonstrates normal hearing during visit. Hemoglobin A1C - 06/2018 Direct LDL- 06/2018 Dental- UTD Vision- plans to schedule  Social  Alcohol intake - no    Smoking history- former   Smokers in home? none Illicit drug use? none Exercise - walking and working in his wood shop Diet - low fat Sexually Active -never BMI- discussed the importance of a healthy diet, water intake and the benefits of aerobic exercise.  Educational material provided.   Safety  Patient feels safe at home- yes Patient does have smoke detectors at home- yes Patient does wear sunscreen or protective clothing when in direct sunlight -yes Patient does wear seat belt when in a moving vehicle -yes Patient drives- yes  [id number]-16 precautions and sickness symptoms discussed.   Activities of Daily Living Patient denies needing assistance with: driving, household chores, feeding themselves, getting from bed to chair, getting to the toilet, bathing/showering, dressing, managing money, or preparing meals.  No new identified risk were noted.    Depression Screen Patient denies losing interest in daily life, feeling hopeless, or crying easily over simple problems.   Medication-taking as directed and without issues.   Fall Screen Patient denies being afraid of falling or falling in the last year.   Memory Screen Patient is alert.  Patient denies difficulty focusing, concentrating or misplacing items. Correctly identified the president of the [place], season and recall. 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  Immunizations The following Immunizations were discussed: Influenza, shingles, pneumonia, and tetanus.   Other Providers Patient Care Team: Leone Haven, MD as PCP - General (Family Medicine) End, Harrell Gave, MD as PCP - Cardiology (Cardiology) Jackolyn Confer, MD (Internal  Medicine) Bary Castilla Forest Gleason, MD (General Surgery)  Exercise Activities and Dietary recommendations Current Exercise Habits: Home exercise routine, Type of exercise: walking, Time (Minutes): 20, Frequency (Times/Week): 2, Weekly Exercise (Minutes/Week): 40, Intensity: Mild  Goals      Patient Stated   . Follow up with Primary Care Provider (pt-stated)     Lower A1c Take all medications as directed Healthy diet Walk more for exercise         Fall Risk Fall Risk  07/04/2018 10/18/2017 04/21/2017 04/20/2016 10/24/2014  Falls in the past year? 0 Yes Yes Yes No  Number falls in past yr: - 2 or more 2 or more 2 or more -  Injury with Fall? - No No No -  Risk Factor Category  - - High Fall Risk High Fall Risk -  Risk for fall due to : - - History of fall(s) History of fall(s) -  Follow up - - Falls prevention discussed Education provided;Falls prevention discussed -  Comment - - - Leg weakness -   Depression Screen PHQ 2/9 Scores 07/04/2018 10/18/2017 04/21/2017 05/11/2016  PHQ - 2 Score 0 0 0 0  PHQ- 9 Score - - - 2    Cognitive Function MMSE - Mini Mental State Exam 04/21/2017 04/20/2016  Orientation to time 5 5  Orientation to Place 5 5  Registration 3 3  Attention/ Calculation 5 5  Recall 2 3  Language- name 2 objects 2 2  Language- repeat 1 1  Language- follow 3 step command 3 3  Language- read & follow direction 1 1  Write a sentence 1 1  Copy design 1 1  Total score 29 30     6CIT Screen 07/04/2018  What Year? 0 points  What month? 0 points  What time? 0 points  Count back from 20 0 points  Months in reverse 0 points  Repeat phrase 2 points  Total Score 2    Immunization History  Administered Date(s) Administered  . Influenza,inj,Quad PF,6+ Mos 11/14/2013, 11/15/2015, 10/18/2017  . Influenza-Unspecified 10/28/2012, 10/19/2014  . Pneumococcal Polysaccharide-23 04/28/2013  . Tdap 04/28/2013   Screening Tests Health Maintenance  Topic Date Due  . INFLUENZA  VACCINE  08/13/2018  . OPHTHALMOLOGY EXAM  12/14/2018  . HEMOGLOBIN A1C  12/29/2018  . FOOT EXAM  06/29/2019  . COLONOSCOPY  02/23/2022  . TETANUS/TDAP  04/29/2023  . Hepatitis C Screening  Completed      Plan:    End of life planning; Advance aging; Advanced directives discussed.  Copy of current HCPOA/Living Will requested.    I have personally reviewed and noted the following in the patient's chart:   . Medical and social history . Use of alcohol, tobacco or illicit drugs  . Current medications and supplements . Functional ability and status . Nutritional status . Physical activity . Advanced directives . List of other physicians . Hospitalizations, surgeries, and ER visits in previous 12 months . Vitals . Screenings to include cognitive, depression, and falls . Referrals and appointments  In addition, I have reviewed and discussed with patient certain preventive protocols, quality metrics, and best practice recommendations. A written personalized care plan for preventive services as well as general preventive health recommendations were provided to patient.     Varney Biles, LPN  02/08/5168

## 2018-07-04 NOTE — Telephone Encounter (Signed)
Pt dropped off Parking placard form to be filled out  Placed in Dr. Tharon Aquas color folder upfront

## 2018-07-05 LAB — MICROALBUMIN, URINE: Microalb, Ur: 70.4

## 2018-07-06 ENCOUNTER — Other Ambulatory Visit: Payer: Self-pay | Admitting: Family Medicine

## 2018-07-07 MED ORDER — EMPAGLIFLOZIN 10 MG PO TABS: 10.0000 mg | ORAL_TABLET | Freq: Every day | ORAL | 1 refills | Status: DC

## 2018-07-07 NOTE — Progress Notes (Signed)
I have reviewed the above note and agree.  Mohid Furuya, M.D.  

## 2018-07-08 NOTE — Telephone Encounter (Signed)
Patient dropped off a DMV form  To be signed. Its in the basket to sign   Nina,cma

## 2018-07-12 DIAGNOSIS — L720 Epidermal cyst: Secondary | ICD-10-CM | POA: Diagnosis not present

## 2018-07-12 DIAGNOSIS — D1800 Hemangioma unspecified site: Secondary | ICD-10-CM | POA: Diagnosis not present

## 2018-07-12 DIAGNOSIS — L570 Actinic keratosis: Secondary | ICD-10-CM | POA: Diagnosis not present

## 2018-07-12 NOTE — Telephone Encounter (Signed)
Please determine what he needs this for.  Please see if he has to use a cane or a walker to walk.  Please see if he can walk more than 200 feet without stopping.  Thanks.

## 2018-07-13 NOTE — Telephone Encounter (Signed)
Patient states he does use a cane to walk and he cannot walk more that 200 feet without stopping.  Shealynn Saulnier,cma

## 2018-07-13 NOTE — Telephone Encounter (Signed)
lmtcb to ask specific questions about his DMV form.  Grace Haggart,cma

## 2018-07-13 NOTE — Telephone Encounter (Signed)
Patient returning call to [facility]. Spoke with patient about the specific questions that Dr Caryl Bis had. Patient states that he does use a cane to walk and he cannot walk more than 200 feet without stopping. Please advise.

## 2018-07-17 NOTE — Telephone Encounter (Signed)
Noted. Please fill the form in and I will sign.

## 2018-07-18 NOTE — Telephone Encounter (Signed)
DMV form in signed folder to be signed by provider.  Cristie Mckinney,cma

## 2018-08-02 ENCOUNTER — Telehealth: Payer: Self-pay | Admitting: Family Medicine

## 2018-08-02 MED ORDER — SITAGLIPTIN PHOSPHATE 100 MG PO TABS: 100.0000 mg | ORAL_TABLET | Freq: Every day | ORAL | 1 refills | Status: DC

## 2018-08-02 MED ORDER — TRAMADOL HCL 50 MG PO TABS: 100.0000 mg | ORAL_TABLET | Freq: Three times a day (TID) | ORAL | 0 refills | Status: DC | PRN

## 2018-08-02 NOTE — Telephone Encounter (Signed)
Copied from [place] [phone]. Topic: Quick Communication - Rx Refill/Question >> Aug 02, 2018  1:19 PM Nils Flack, Melissa J wrote: Medication: traMADol (ULTRAM) 50 MG tablet and JANUVIA 100 MG tablet  Has the patient contacted their pharmacy? No. (Agent: If no, request that the patient contact the pharmacy for the refill.) (Agent: If yes, when and what did the pharmacy advise?)  Preferred Pharmacy (with phone number or street name): [facility] [address]   Agent: Please be advised that RX refills may take up to 3 business days. We ask that you follow-up with your pharmacy.

## 2018-08-02 NOTE — Telephone Encounter (Signed)
Pt calling to check status of handicap parking  card. Please call

## 2018-08-02 NOTE — Telephone Encounter (Signed)
Sent to pharmacy 

## 2018-08-02 NOTE — Telephone Encounter (Signed)
Refill on Januvia sent.    Pt also requesting refill on tramadol.    Last OV:  06/29/18  Last Refill:  07/05/18

## 2018-08-02 NOTE — Telephone Encounter (Signed)
Called patient and informed him that his handicap placard form was up front ready for pick up, this was left on voicemail.  Ardean Melroy,cma

## 2018-08-18 ENCOUNTER — Ambulatory Visit: Payer: Self-pay | Admitting: Pharmacist

## 2018-08-18 DIAGNOSIS — E785 Hyperlipidemia, unspecified: Secondary | ICD-10-CM

## 2018-08-18 NOTE — Chronic Care Management (AMB) (Signed)
  Chronic Care Management   Note  08/18/2018 Name: Walter Salas MRN: [id number] DOB: 12-02-1955  [name] is a 63 y.o. year old male who is a primary care patient of Leone Haven, MD.  Received message from patient's payor that he is failing the Statin Use In Diabetes measure. However, prescription for atorvastatin is on his medication list in Epic. Appears he was previously on rosuvastatin 40 mg daily at last cardiology visit on 04/20/2018, however, this order was d/c and changed to atorvastatin 80 mg daily on 05/23/2018, unclear reason why.   Per Epic refill history, no statin medications have been filled on insurance in 2020.   [facility]ed [facility], they confirm that they do not have any statin prescriptions on file.   Will alert primary care provider Dr. Caryl Bis to this. I would be happy to provide chronic medication management support for this patient with multiple chronic conditions. Will collaborate with Dr. Caryl Bis for a CCM referral if appropriate.   [name], PharmD, [place] Pharmacist [facility] [phone]

## 2018-08-20 NOTE — Progress Notes (Signed)
I have reviewed the above note and agree. I was available to the pharmacist for consultation.  I think it would be reasonable to have the clinical pharmacist involved with CCM.  I will check with the clinical pharmacist to determine if we need to reach out to the patient prior to placing this referral.  Tommi Rumps, MD

## 2018-08-27 NOTE — Addendum Note (Signed)
Addended by: Leone Haven on: 08/27/2018 07:56 AM   Modules accepted: Orders

## 2018-09-01 ENCOUNTER — Ambulatory Visit: Payer: Self-pay | Admitting: Pharmacist

## 2018-09-01 NOTE — Chronic Care Management (AMB) (Signed)
  Chronic Care Management   Note  09/01/2018 Name: Walter Salas MRN: [id number] DOB: [birth year]  Walter Salas is a 63 y.o. year old male who is a primary care patient of Caryl Bis, Angela Adam, MD. The CCM team was consulted for assistance with chronic disease management and care coordination needs.    Received notice from patient's insurance that he is failing statin in diabetes measure. CCM referral placed by Dr. Caryl Bis.   Contacted patient; left HIPAA compliant message for patient to return my call at his convenience.  Follow up plan: - If I do not hear back from patient, will outreach in the next 3-4 weeks  [name], PharmD, [place] Pharmacist [place] [phone]

## 2018-09-12 ENCOUNTER — Ambulatory Visit (INDEPENDENT_AMBULATORY_CARE_PROVIDER_SITE_OTHER): Payer: Medicare Other | Admitting: Family Medicine

## 2018-09-12 ENCOUNTER — Ambulatory Visit: Payer: Medicare Other | Admitting: Family Medicine

## 2018-09-12 ENCOUNTER — Other Ambulatory Visit: Payer: Self-pay

## 2018-09-12 ENCOUNTER — Encounter: Payer: Self-pay | Admitting: Family Medicine

## 2018-09-12 VITALS — BP 148/72 | HR 100 | Temp 98.4°F | Resp 18 | Ht 70.0 in | Wt 231.2 lb

## 2018-09-12 DIAGNOSIS — M5442 Lumbago with sciatica, left side: Secondary | ICD-10-CM | POA: Diagnosis not present

## 2018-09-12 DIAGNOSIS — Z23 Encounter for immunization: Secondary | ICD-10-CM | POA: Diagnosis not present

## 2018-09-12 MED ORDER — TRAMADOL HCL 50 MG PO TABS: 100.0000 mg | ORAL_TABLET | Freq: Three times a day (TID) | ORAL | 0 refills | Status: DC | PRN

## 2018-09-12 MED ORDER — KETOROLAC TROMETHAMINE 60 MG/2ML IM SOLN
60.0000 mg | Freq: Once | INTRAMUSCULAR | Status: AC
  Administered 2018-09-12: 10:00:00 60 mg via INTRAMUSCULAR

## 2018-09-12 MED ORDER — METHYLPREDNISOLONE ACETATE 40 MG/ML IJ SUSP
40.0000 mg | Freq: Once | INTRAMUSCULAR | Status: AC
  Administered 2018-09-12: 40 mg via INTRAMUSCULAR

## 2018-09-12 MED ORDER — METHYLPREDNISOLONE 4 MG PO TBPK: ORAL_TABLET | ORAL | 0 refills | Status: DC

## 2018-09-12 NOTE — Progress Notes (Signed)
Subjective:    Patient ID: Walter Salas, male    DOB: 20-Jun-1955, 63 y.o.   MRN: [id number]  HPI   Patient presents to clinic planing of left-sided low back pain that is flared up recently.  Cannot remember any specific injury, fall or motion that set off the pain.  States he has had back pain flareup off and on for many years.  He has tried topical rubs and tramadol with some effect in reducing pain, but once the tramadol and rub wear off, has severe pain and is unable to sit or stand for any length of time.  Denies saddle anesthesia, denies bilateral leg numbness, denies loss of bowel or bladder control.  Does feel pain in left-sided low back and shooting down left leg at times.  No fever or chills.  Patient Active Problem List   Diagnosis Date Noted  . Skin lesion 06/29/2018  . Lung nodule 02/22/2018  . Thoracic aortic aneurysm without rupture ([place]) 01/24/2018  . Pericardial effusion 01/24/2018  . History of syncope 01/24/2018  . Obesity (BMI 30.0-34.9) 10/21/2017  . History of tobacco abuse 06/28/2017  . Rib pain on left side 04/20/2017  . H. pylori infection 03/28/2017  . Encounter for general adult medical examination with abnormal findings 03/26/2017  . B12 deficiency 03/26/2017  . Thyroid nodule 11/11/2016  . History of colon polyps 11/11/2016  . Dilated aortic root ([place]) 06/23/2016  . Syncope 04/20/2016  . Urinary incontinence 04/20/2016  . Onychomycosis 02/11/2016  . Chronic back pain 05/15/2015  . Nodule of finger of left hand 05/15/2015  . Knee pain, bilateral 11/15/2014  . Diastasis recti 03/21/2014  . Hereditary and idiopathic peripheral neuropathy 08/14/2013  . Unspecified vitamin D deficiency 08/14/2013  . Diabetes mellitus, type 2 ([place]) 04/28/2013  . Essential hypertension 04/28/2013  . Coronary artery disease 04/28/2013  . Hyperlipidemia LDL goal <70 04/28/2013  . Dorsal (thoracic) vertebral fracture ([place]) 12/15/2012  . History of subdural hematoma  12/14/2012   Social History   Tobacco Use  . Smoking status: Former Smoker    Packs/day: 1.50    Years: 50.00    Pack years: 75.00    Types: Cigarettes    Quit date: 02/15/2018    Years since quitting: 0.5  . Smokeless tobacco: Never Used  Substance Use Topics  . Alcohol use: No   Review of Systems  Constitutional: Negative for chills, fatigue and fever.  HENT: Negative for congestion, ear pain, sinus pain and sore throat.   Eyes: Negative.   Respiratory: Negative for cough, shortness of breath and wheezing.   Cardiovascular: Negative for chest pain, palpitations and leg swelling.  Gastrointestinal: Negative for abdominal pain, diarrhea, nausea and vomiting.  Genitourinary: Negative for dysuria, frequency and urgency.  Musculoskeletal: +left sided low back pain Skin: Negative for color change, pallor and rash.  Neurological: Negative for syncope, light-headedness and headaches.  Psychiatric/Behavioral: The patient is not nervous/anxious.       Objective:   Physical Exam Vitals signs and nursing note reviewed.  Constitutional:      General: He is not in acute distress.    Appearance: He is not ill-appearing or toxic-appearing.     Comments: Appears uncomfortable  HENT:     Head: Normocephalic and atraumatic.  Eyes:     General: No scleral icterus.    Extraocular Movements: Extraocular movements intact.     Pupils: Pupils are equal, round, and reactive to light.  Cardiovascular:     Rate and Rhythm:  Normal rate and regular rhythm.     Heart sounds: Normal heart sounds.  Pulmonary:     Effort: Pulmonary effort is normal. No respiratory distress.     Breath sounds: Normal breath sounds.  Musculoskeletal:       Back:     Right lower leg: No edema.     Left lower leg: No edema.     Comments: Location of pain indicated by red circle on diagram.  Tenderness when lumbar spine and off to left side palpated.  Able to feel me touching bilateral lower extremities.  Pain with  straight leg raise of left leg.  Quadricep strength equal and strong.  Skin:    General: Skin is warm and dry.     Capillary Refill: Capillary refill takes less than 2 seconds.     Coloration: Skin is not jaundiced or pale.     Findings: No bruising.  Neurological:     Mental Status: He is alert.     Comments: Has cane for balance  Psychiatric:        Mood and Affect: Mood normal.        Behavior: Behavior normal.     Today's Vitals   09/12/18 0935  BP: (!) 148/72  Pulse: 100  Resp: 18  Temp: 98.4 F (36.9 C)  TempSrc: Oral  SpO2: 95%  Weight: 231 lb 3.2 oz (104.9 kg)  Height: 5\' 10"  (1.778 m)   Body mass index is 33.17 kg/m.     Assessment & Plan:    Acute left-sided low back pain with sciatica-we will give patient IM Toradol and IM methylprednisolone x1 in clinic.  Will take oral steroid taper to help reduce pain.  Tramadol renewed to use if needed for pain, registry checked and is appropriate for this refill.  Can continue use topical rub and or heating pad on sore area.  Also recommended stretching exercises to be done multiple times throughout the day to help the joints are becoming too stiff.  Advised to call office right away pain worsens.  Administrations This Visit    ketorolac (TORADOL) injection 60 mg    Admin Date 09/12/2018 Action Given Dose 60 mg Route Intramuscular Administered By Neta Ehlers, RMA       methylPREDNISolone acetate (DEPO-MEDROL) injection 40 mg    Admin Date 09/12/2018 Action Given Dose 40 mg Route Intramuscular Administered By Neta Ehlers, RMA         Flu vaccine given in clinic.  He will otherwise keep regularly scheduled follow-up with PCP.  Return 1 week if any issues arise.

## 2018-10-06 DIAGNOSIS — I219 Acute myocardial infarction, unspecified: Secondary | ICD-10-CM | POA: Insufficient documentation

## 2018-10-06 DIAGNOSIS — M5416 Radiculopathy, lumbar region: Secondary | ICD-10-CM | POA: Diagnosis not present

## 2018-10-10 ENCOUNTER — Ambulatory Visit: Payer: Medicare Other | Admitting: Family Medicine

## 2018-10-13 ENCOUNTER — Ambulatory Visit: Payer: Self-pay | Admitting: Pharmacist

## 2018-10-13 ENCOUNTER — Telehealth: Payer: Medicare Other

## 2018-10-13 NOTE — Chronic Care Management (AMB) (Signed)
  Chronic Care Management   Note  10/13/2018 Name: Walter Salas MRN: [id number] DOB: Jul 03, 1955  [name] is a 63 y.o. year old male who is a primary care patient of Caryl Bis, Angela Adam, MD. The CCM team was consulted for assistance with chronic disease management and care coordination needs.    Attempted to contact patient to discuss CCM program; unidentified male answered and took down my name and phone number. She noted that she didn't think patient would be interested in participating in any programs, but she would notify him.   Of note, patient has duplicate 4 month follow up appointments with Dr. Caryl Bis this month. Informed her to have him call the office to cancel one.   [name], PharmD, [place] Pharmacist [facility] [place] [phone]

## 2018-10-17 ENCOUNTER — Ambulatory Visit: Payer: Self-pay | Admitting: Pharmacist

## 2018-10-17 NOTE — Chronic Care Management (AMB) (Signed)
  Chronic Care Management   Note  10/17/2018 Name: NAWEED DOLLARHIDE MRN: [id number] DOB: 1955/07/24  [name] is a 63 y.o. year old male who is a primary care patient of Caryl Bis, Angela Adam, MD. The CCM team was consulted for assistance with chronic disease management and care coordination needs.    Received return call from patient. Discussed CCM service - patient is NOT interested at this time.   Follow up plan: - Will close referral. I would be happy to work with the patient in the future if he decides he is interested w/ a new referral.  [name], PharmD, [place] Pharmacist [employer] [facility] [phone]

## 2018-10-24 DIAGNOSIS — M545 Low back pain: Secondary | ICD-10-CM | POA: Diagnosis not present

## 2018-11-01 ENCOUNTER — Other Ambulatory Visit: Payer: Self-pay

## 2018-11-02 ENCOUNTER — Encounter: Payer: Self-pay | Admitting: Family Medicine

## 2018-11-02 ENCOUNTER — Ambulatory Visit (INDEPENDENT_AMBULATORY_CARE_PROVIDER_SITE_OTHER): Payer: Medicare Other | Admitting: Family Medicine

## 2018-11-02 ENCOUNTER — Other Ambulatory Visit: Payer: Self-pay

## 2018-11-02 VITALS — BP 130/70 | HR 88 | Temp 97.7°F | Ht 70.0 in | Wt 221.2 lb

## 2018-11-02 DIAGNOSIS — M5442 Lumbago with sciatica, left side: Secondary | ICD-10-CM | POA: Diagnosis not present

## 2018-11-02 DIAGNOSIS — M545 Low back pain: Secondary | ICD-10-CM | POA: Diagnosis not present

## 2018-11-02 DIAGNOSIS — G609 Hereditary and idiopathic neuropathy, unspecified: Secondary | ICD-10-CM | POA: Diagnosis not present

## 2018-11-02 DIAGNOSIS — E119 Type 2 diabetes mellitus without complications: Secondary | ICD-10-CM

## 2018-11-02 DIAGNOSIS — I10 Essential (primary) hypertension: Secondary | ICD-10-CM

## 2018-11-02 DIAGNOSIS — G8929 Other chronic pain: Secondary | ICD-10-CM

## 2018-11-02 DIAGNOSIS — E785 Hyperlipidemia, unspecified: Secondary | ICD-10-CM

## 2018-11-02 LAB — COMPREHENSIVE METABOLIC PANEL
ALT: 15 U/L (ref 0–53)
AST: 14 U/L (ref 0–37)
Albumin: 4.9 g/dL (ref 3.5–5.2)
Alkaline Phosphatase: 60 U/L (ref 39–117)
BUN: 17 mg/dL (ref 6–23)
CO2: 29 mEq/L (ref 19–32)
Calcium: 9.7 mg/dL (ref 8.4–10.5)
Chloride: 95 mEq/L — ABNORMAL LOW (ref 96–112)
Creatinine, Ser: 0.67 mg/dL (ref 0.40–1.50)
GFR: 119.06 mL/min (ref >60.00)
Glucose, Bld: 173 mg/dL — ABNORMAL HIGH (ref 70–99)
Potassium: 3.8 mEq/L (ref 3.5–5.1)
Sodium: 133 mEq/L — ABNORMAL LOW (ref 135–145)
Total Bilirubin: 0.8 mg/dL (ref 0.2–1.2)
Total Protein: 7.4 g/dL (ref 6.0–8.3)

## 2018-11-02 LAB — LIPID PANEL
Cholesterol: 148 mg/dL (ref 0–200)
HDL: 27 mg/dL — ABNORMAL LOW (ref >39.00)
Total CHOL/HDL Ratio: 5
Triglycerides: 480 mg/dL — ABNORMAL HIGH (ref 0.0–149.0)

## 2018-11-02 LAB — HEMOGLOBIN A1C: Hgb A1c MFr Bld: 7.1 % — ABNORMAL HIGH (ref 4.6–6.5)

## 2018-11-02 LAB — LDL CHOLESTEROL, DIRECT: Direct LDL: 52 mg/dL

## 2018-11-02 MED ORDER — SITAGLIPTIN PHOSPHATE 100 MG PO TABS: 100.0000 mg | ORAL_TABLET | Freq: Every day | ORAL | 1 refills | Status: DC

## 2018-11-02 MED ORDER — TRAMADOL HCL 50 MG PO TABS: 100.0000 mg | ORAL_TABLET | Freq: Three times a day (TID) | ORAL | 0 refills | Status: DC | PRN

## 2018-11-02 NOTE — Assessment & Plan Note (Signed)
Stable.  Continue Lyrica.

## 2018-11-02 NOTE — Assessment & Plan Note (Signed)
Check A1c.  Continue current regimen. 

## 2018-11-02 NOTE — Assessment & Plan Note (Signed)
Check lipid panel  

## 2018-11-02 NOTE — Assessment & Plan Note (Signed)
Well-controlled.  Continue current regimen.  Check lab work. 

## 2018-11-02 NOTE — Assessment & Plan Note (Signed)
He will keep his appointment with orthopedics tomorrow.  Given reasons to seek medical attention in the emergency room.

## 2018-11-02 NOTE — Progress Notes (Signed)
Tommi Rumps, MD Phone: [phone]  Walter Salas is a 63 y.o. male who presents today for follow-up.  Diabetes: Patient has not been checking his blood sugars as he ran out of test strips.  He is waiting on these to come from the [place].  He is taking Metformin and Januvia.  He could not afford the Jardiance and the [place] would not cover it.  No polyuria or polydipsia.  No hypoglycemia.  Neuropathy: This is stable.  He notes the Lyrica is quite beneficial.  Low back pain: This is a chronic issue.  Worsened recently.  Does have pain radiating down his left leg with sciatica symptoms.  Notes his left foot is numb.  Does note some weakness in his left leg.  No bowel or bladder incontinence.  He has seen orthopedics and he had an MRI last week.  He sees them for follow-up tomorrow to determine the next step in management.  He is been taking tramadol and muscle relaxer.  He has a history of lumbar spine fusion and multiple surgeries on his back.  Hypertension: Typically 120/70.  Taking chlorthalidone, amlodipine, and losartan.  No chest pain, shortness of breath, or edema.  Social History   Tobacco Use  Smoking Status Former Smoker  . Packs/day: 1.50  . Years: 50.00  . Pack years: 75.00  . Types: Cigarettes  . Quit date: 02/15/2018  . Years since quitting: 0.7  Smokeless Tobacco Never Used     ROS see history of present illness  Objective  Physical Exam Vitals:   11/02/18 0855 11/02/18 0925  BP: 140/80 130/70  Pulse: 88   Temp: 97.7 F (36.5 C)   SpO2: 97%     BP Readings from Last 3 Encounters:  11/02/18 130/70  09/12/18 (!) 148/72  06/29/18 110/60   Wt Readings from Last 3 Encounters:  11/02/18 221 lb 3.2 oz (100.3 kg)  09/12/18 231 lb 3.2 oz (104.9 kg)  06/29/18 234 lb (106.1 kg)    Physical Exam Constitutional:      General: He is not in acute distress.    Appearance: He is not diaphoretic.  Cardiovascular:     Rate and Rhythm: Normal rate and regular rhythm.      Heart sounds: Normal heart sounds.  Pulmonary:     Effort: Pulmonary effort is normal.     Breath sounds: Normal breath sounds.  Musculoskeletal:     Comments: No midline spine tenderness, no midline spine step-off, lower lumbar muscular back tenderness  Skin:    General: Skin is warm and dry.  Neurological:     Mental Status: He is alert.     Comments: 4+/5 strength left quad, hamstring, plantar flexion, and dorsiflexion, 5/5 strength right quad, hamstring, plantar flexion, and dorsiflexion, no numbness in his upper or lower legs above the ankles on light touch sensation, 2+ patellar reflexes bilaterally      Assessment/Plan: Please see individual problem list.  Hyperlipidemia LDL goal <70 Check lipid panel.  Chronic back pain He will keep his appointment with orthopedics tomorrow.  Given reasons to seek medical attention in the emergency room.  Hereditary and idiopathic peripheral neuropathy Stable.  Continue Lyrica.  Essential hypertension Well-controlled.  Continue current regimen.  Check lab work.  Diabetes mellitus, type 2 (HCC) Check A1c.  Continue current regimen.   Orders Placed This Encounter  Procedures  . Comp Met (CMET)  . HgB A1c  . Lipid panel    Meds ordered this encounter  Medications  .  sitaGLIPtin (JANUVIA) 100 MG tablet    Sig: Take 1 tablet (100 mg total) by mouth daily.    Dispense:  90 tablet    Refill:  1  . traMADol (ULTRAM) 50 MG tablet    Sig: Take 2 tablets (100 mg total) by mouth every 8 (eight) hours as needed for moderate pain.    Dispense:  90 tablet    Refill:  0    Not to exceed 5 additional fills before 11/11/2015.     Tommi Rumps, MD [place]

## 2018-11-02 NOTE — Patient Instructions (Signed)
Nice to see you. Please see the orthopedist as planned. If you develop loss of bowel or bladder function, increasing weakness, increasing pain, or any new symptoms please seek medical attention.

## 2018-11-03 DIAGNOSIS — M5416 Radiculopathy, lumbar region: Secondary | ICD-10-CM | POA: Diagnosis not present

## 2018-11-08 ENCOUNTER — Ambulatory Visit: Payer: Medicare Other | Admitting: Family Medicine

## 2018-11-13 ENCOUNTER — Other Ambulatory Visit: Payer: Self-pay | Admitting: Family Medicine

## 2018-11-13 DIAGNOSIS — E782 Mixed hyperlipidemia: Secondary | ICD-10-CM

## 2018-11-13 MED ORDER — VASCEPA 1 G PO CAPS: 2.0000 | ORAL_CAPSULE | Freq: Two times a day (BID) | ORAL | 3 refills | Status: DC

## 2018-11-14 ENCOUNTER — Telehealth: Payer: Self-pay | Admitting: Family Medicine

## 2018-11-14 ENCOUNTER — Other Ambulatory Visit: Payer: Self-pay | Admitting: Internal Medicine

## 2018-11-14 NOTE — Telephone Encounter (Signed)
Renee, from [facility], called and is requesting an update on the medical clearance that was faxed on 11/04/2018. She is requesting to have this paperwork filled out and faxed back. Please advise.   Fax # [phone]

## 2018-11-17 DIAGNOSIS — M961 Postlaminectomy syndrome, not elsewhere classified: Secondary | ICD-10-CM | POA: Diagnosis not present

## 2018-11-17 DIAGNOSIS — M5416 Radiculopathy, lumbar region: Secondary | ICD-10-CM | POA: Diagnosis not present

## 2018-11-17 DIAGNOSIS — M5136 Other intervertebral disc degeneration, lumbar region: Secondary | ICD-10-CM | POA: Diagnosis not present

## 2018-12-02 ENCOUNTER — Telehealth: Payer: Self-pay | Admitting: Family Medicine

## 2018-12-02 NOTE — Telephone Encounter (Signed)
Patient has been reaching out to [place] for several weeks now for refill on his Lyrica, he has not received return cal or refill. Confirmed with Pharmacy refill request have been sent multiple time to [place] with no response , patient is requesting refill from office until he can get response from [place] doctor. Patient is compliant and keeps regular appointments with PCP per chart has follow up scheduled every 4 months.

## 2018-12-04 ENCOUNTER — Other Ambulatory Visit: Payer: Self-pay | Admitting: Internal Medicine

## 2018-12-04 DIAGNOSIS — G8929 Other chronic pain: Secondary | ICD-10-CM

## 2018-12-04 MED ORDER — PREGABALIN 50 MG PO CAPS: 50.0000 mg | ORAL_CAPSULE | Freq: Three times a day (TID) | ORAL | 0 refills | Status: DC

## 2018-12-04 NOTE — Telephone Encounter (Signed)
Given 10 day supply until [facility] or [facility] returns

## 2018-12-05 NOTE — Telephone Encounter (Signed)
I called and informed the patient's wife that a 10 day supply [facility] until he hears from the [place], she understood.  Loralei Radcliffe,cma

## 2018-12-12 ENCOUNTER — Other Ambulatory Visit: Payer: Self-pay

## 2018-12-12 ENCOUNTER — Telehealth: Payer: Self-pay | Admitting: *Deleted

## 2018-12-12 DIAGNOSIS — M5442 Lumbago with sciatica, left side: Secondary | ICD-10-CM

## 2018-12-12 MED ORDER — TRAMADOL HCL 50 MG PO TABS: 100.0000 mg | ORAL_TABLET | Freq: Three times a day (TID) | ORAL | 0 refills | Status: DC | PRN

## 2018-12-12 MED ORDER — VASCEPA 1 G PO CAPS: 2.0000 g | ORAL_CAPSULE | Freq: Two times a day (BID) | ORAL | 3 refills | Status: DC

## 2018-12-12 NOTE — Telephone Encounter (Signed)
Copied from [place] [id number]. Topic: Quick Communication - Rx Refill/Question >> Dec 09, 2018 10:46 AM Carolyn Stare wrote: Medication Icosapent Ethyl (VASCEPA) 1 g CAPS     and    TRAMADOL   Preferred Pharm[facility]   Agent: Please be advised that RX refills may take up to 3 business days. We ask that you follow-up with your pharmacy.

## 2018-12-15 ENCOUNTER — Other Ambulatory Visit: Payer: Self-pay

## 2018-12-15 DIAGNOSIS — M5416 Radiculopathy, lumbar region: Secondary | ICD-10-CM | POA: Diagnosis not present

## 2018-12-19 ENCOUNTER — Other Ambulatory Visit: Payer: Self-pay

## 2018-12-19 ENCOUNTER — Other Ambulatory Visit (INDEPENDENT_AMBULATORY_CARE_PROVIDER_SITE_OTHER): Payer: Medicare Other

## 2018-12-19 DIAGNOSIS — E782 Mixed hyperlipidemia: Secondary | ICD-10-CM | POA: Diagnosis not present

## 2018-12-19 LAB — LIPID PANEL
Cholesterol: 146 mg/dL (ref 0–200)
HDL: 23.3 mg/dL — ABNORMAL LOW (ref >39.00)
Total CHOL/HDL Ratio: 6
Triglycerides: 743 mg/dL — ABNORMAL HIGH (ref 0.0–149.0)

## 2018-12-19 LAB — LDL CHOLESTEROL, DIRECT: Direct LDL: 54 mg/dL

## 2018-12-22 ENCOUNTER — Encounter: Payer: Self-pay | Admitting: Internal Medicine

## 2018-12-22 ENCOUNTER — Ambulatory Visit (INDEPENDENT_AMBULATORY_CARE_PROVIDER_SITE_OTHER): Payer: Medicare Other | Admitting: Internal Medicine

## 2018-12-22 ENCOUNTER — Other Ambulatory Visit: Payer: Self-pay

## 2018-12-22 VITALS — BP 130/64 | HR 77 | Ht 70.0 in | Wt 237.2 lb

## 2018-12-22 DIAGNOSIS — Z01810 Encounter for preprocedural cardiovascular examination: Secondary | ICD-10-CM

## 2018-12-22 DIAGNOSIS — I10 Essential (primary) hypertension: Secondary | ICD-10-CM

## 2018-12-22 DIAGNOSIS — I712 Thoracic aortic aneurysm, without rupture, unspecified: Secondary | ICD-10-CM

## 2018-12-22 DIAGNOSIS — E785 Hyperlipidemia, unspecified: Secondary | ICD-10-CM

## 2018-12-22 DIAGNOSIS — I25118 Atherosclerotic heart disease of native coronary artery with other forms of angina pectoris: Secondary | ICD-10-CM

## 2018-12-22 NOTE — Patient Instructions (Addendum)
Medication Instructions:  Your physician recommends that you continue on your current medications as directed. Please refer to the Current Medication list given to you today.  *If you need a refill on your cardiac medications before your next appointment, please call your pharmacy*  Lab Work: Your physician recommends that you return for lab work in: [place].  If you have labs (blood work) drawn today and your tests are completely normal, you will receive your results only by: Marland Kitchen MyChart Message (if you have MyChart) OR . A paper copy in the mail If you have any lab test that is abnormal or we need to change your treatment, we will call you to review the results.  Testing/Procedures: 1- Non-Cardiac CT Angiography (CTA) OF THE CHEST AND AORTA, is a special type of CT scan that uses a computer to produce multi-dimensional views of major blood vessels throughout the body. In CT angiography, a contrast material is injected through an IV to help visualize the blood vessels WHERE: [facility]  [address], [place],  [address] DATE/TIME: On Wednesday, 01/04/19, Arrive at 08:15 am. INSTRUCTIONS: No solids for 4 hours prior to procedure; Only clear liquids during this time.  Follow-Up: At [facility], you and your health needs are our priority.  As part of our continuing mission to provide you with exceptional heart care, we have created designated Provider Care Teams.  These Care Teams include your primary Cardiologist (physician) and Advanced Practice Providers (APPs -  Physician Assistants and Nurse Practitioners) who all work together to provide you with the care you need, when you need it.  Your next appointment:   6 month(s)  The format for your next appointment:   In Person  Provider:    You may see Nelva Bush, MD or one of the following Advanced Practice Providers on your designated Care Team:    Murray Hodgkins, NP  Christell Faith, PA-C  Marrianne Mood, PA-C

## 2018-12-22 NOTE — Progress Notes (Signed)
Follow-up Outpatient Visit Date: 12/22/2018  Primary Care Provider: Leone Haven, MD [address] [place] [address]  Chief Complaint: Follow-up coronary artery disease  HPI:  Walter Salas is a 63 y.o. male with history of coronary artery disease status post PCI's to the LCx and RCA in 2014, acsending aortic aneurysm, HTN, HLD, type II DM, depression, renal stones, and fall with intracranial hemorrhage, who presents for follow-up of coronary artery disease.  We last spoke in early April, at which time [name] noticed significant improvement in his stable angina with addition of amlodipine.  He was previously intolerant of isosorbide mononitrate due to headaches.  He had also quit smoking with the addition of Chantix.  Today, [name] reports feeling well.  He reports two episodes of vague chest pain over the last year.  Overall, his chest discomfort remains well-controlled since we added amlodipine.  He denies significant shortness of breath, palpitations, lightheadedness, and edema.  He is limited by low-back pain and is scheduled to have another back injection (the first one did not offer much relief).  He would like to avoid back surgery if possible, though he may ultimately need to go that route.  Home BP is typically 120-130/60-70 mmHg.  He has is waiting for his Vascepa prescription to arrive.  --------------------------------------------------------------------------------------------------  Past Medical History:  Diagnosis Date  . Agent orange exposure   . Anginal pain ([place])   . Arthritis   . CAD (coronary artery disease)    a. s/p PCI/DES to the LCx and RCA in 2014  . Cancer ([place])    skin  . CHF (congestive heart failure) ([place])   . Chronic cough   . Chronic pain   . Colon polyp   . Depression   . Diabetes mellitus without complication ([place])   . Diverticulosis   . History of kidney stones   . History of stomach ulcers   . HOH (hard of hearing)    . Hx of laminectomy 1999  . Hyperlipidemia   . Hypertension   . Kidney stone   . Myocardial infarction ([place])   . Sleep apnea   . Syncope    a. Event monitor 2018: NSR with first-degree AV block, no significant arrhythmias or pauses; b.  Myoview showed a small in size, mild in severity fixed basal and anterior lateral defect, no ischemia, LVEF 55 to 65%, low risk study  . Thoracic aortic aneurysm ([place])    a. TTE 2018: EF 60-65%, normal wall motion, Gr1DD, mild aortic valve calcification, mildly dilated aortic root measuring up to 4.2 cm, normal RV cavity size and systolic function, normal PASP; b. CTA aorta 6/18:showed an ascending thoracic aortic aneurysmal dilatation with a maximum diameter of 4.3 cm  . Ulcer    Past Surgical History:  Procedure Laterality Date  . [place]  . CARPAL TUNNEL RELEASE Bilateral 1990  . CATARACT EXTRACTION W/PHACO Left 03/08/2018   Procedure: CATARACT EXTRACTION PHACO AND INTRAOCULAR LENS PLACEMENT ([place]) LEFT;  Surgeon: Birder Robson, MD;  Location: [facility];  Service: Ophthalmology;  Laterality: Left;  [place]  00:24.2 CDE 3.25 Fluid pack lot # [id number]  . CATARACT EXTRACTION W/PHACO Right 05/24/2018   Procedure: CATARACT EXTRACTION PHACO AND INTRAOCULAR LENS PLACEMENT (IOC) RIGHT;  Surgeon: Birder Robson, MD;  Location: [place];  Service: Ophthalmology;  Laterality: Right;  Diabetic - oral meds  . CERVICAL FUSION  2000. 2003  . CHOLECYSTECTOMY  2000  .  COLONOSCOPY WITH PROPOFOL N/A 02/23/2017   Procedure: COLONOSCOPY WITH PROPOFOL;  Surgeon: Lollie Sails, MD;  [facility] END[facility]COPY;  Service: Endoscopy;  Laterality: N/A;  . CORONARY ANGIOPLASTY WITH STENT PLACEMENT  2014   [facility] [employer]  . ESOPHAGOGASTRODUODENOSCOPY (EGD) WITH PROPOFOL N/A 02/23/2017   Procedure: ESOPHAGOGASTRODUODENOSCOPY (EGD) WITH PROPOFOL;  Surgeon: Lollie Sails, MD;  Location: [facility] ENDOSCOPY;   Service: Endoscopy;  Laterality: N/A;  . SHOULDER ARTHROSCOPY Right 2012  . TUMOR EXCISION  2007    Current Meds  Medication Sig  . amLODipine (NORVASC) 2.5 MG tablet TAKE 1 TABLET BY MOUTH EVERY DAY  . aspirin 81 MG tablet Take 81 mg by mouth daily.  Marland Kitchen atorvastatin (LIPITOR) 80 MG tablet Take 80 mg by mouth at bedtime.  . chlorthalidone (HYGROTON) 50 MG tablet Take 25 mg by mouth daily.  . cholecalciferol (VITAMIN D) 1000 UNITS tablet Take 1 tablet (1,000 Units total) by mouth QID. (Patient taking differently: Take 4,000 Units by mouth daily. )  . cyclobenzaprine (FLEXERIL) 10 MG tablet Take 10 mg by mouth 2 (two) times daily.  . DULoxetine (CYMBALTA) 20 MG capsule Take 20 mg by mouth daily.  . fenofibrate (TRICOR) 145 MG tablet Take 1 tablet (145 mg total) by mouth daily.  Marland Kitchen glucose blood test strip Check once daily, E11.9  . icosapent Ethyl (VASCEPA) 1 g capsule Take 2 capsules (2 g total) by mouth 2 (two) times daily.  Marland Kitchen lidocaine (LIDODERM) 5 % Place 1 patch onto the skin daily. Remove & Discard patch within 12 hours or as directed by MD (Patient taking differently: Place 1 patch onto the skin daily as needed (pain). Remove & Discard patch within 12 hours or as directed by MD)  . losartan (COZAAR) 25 MG tablet Take 25 mg by mouth daily.  . metFORMIN (GLUCOPHAGE) 1000 MG tablet Take 1,000 mg by mouth 2 (two) times daily with a meal.  . methocarbamol (ROBAXIN) 500 MG tablet Take 500 mg by mouth 2 (two) times daily as needed for muscle spasms.   . methyl salicylate liquid Apply 1 application topically as needed for muscle pain.  . methylPREDNISolone (MEDROL DOSEPAK) 4 MG TBPK tablet Take according to pack instructions  . nitroGLYCERIN (NITROSTAT) 0.4 MG SL tablet Place 1 tablet (0.4 mg total) under the tongue every 5 (five) minutes as needed for chest pain. May take up to 3 doses.  Marland Kitchen omeprazole (PRILOSEC) 20 MG capsule Take 20 mg by mouth daily.  . polyvinyl alcohol (LIQUIFILM TEARS) 1.4 %  ophthalmic solution Place 1 drop into both eyes as needed for dry eyes.   . potassium chloride SA (K-DUR,KLOR-CON) 20 MEQ tablet Take 1 tablet (20 mEq total) by mouth daily.  . pregabalin (LYRICA) 50 MG capsule Take 1 capsule (50 mg total) by mouth 3 (three) times daily.  . sitaGLIPtin (JANUVIA) 100 MG tablet Take 1 tablet (100 mg total) by mouth daily.  . traMADol (ULTRAM) 50 MG tablet Take 2 tablets (100 mg total) by mouth every 8 (eight) hours as needed for moderate pain.  . traZODone (DESYREL) 100 MG tablet Take 100 mg by mouth at bedtime as needed for sleep.  . vitamin B-12 (CYANOCOBALAMIN) 1000 MCG tablet Take 1 tablet (1,000 mcg total) by mouth daily.    Allergies: Gabapentin, Isosorbide mononitrate er [isosorbide dinitrate], and Tylenol [acetaminophen]  Social History   Tobacco Use  . Smoking status: Former Smoker    Packs/day: 1.50    Years:  50.00    Pack years: 75.00    Types: Cigarettes    Quit date: 02/15/2018    Years since quitting: 0.8  . Smokeless tobacco: Never Used  Substance Use Topics  . Alcohol use: No  . Drug use: No    Family History  Problem Relation Age of Onset  . Diabetes Mother   . Heart disease Father   . Diabetes Father   . Diabetes Sister   . Heart disease Sister   . Diabetes Brother   . Heart disease Brother   . Heart disease Paternal Grandfather     Review of Systems: A 12-system review of systems was performed and was negative except as noted in the HPI.  --------------------------------------------------------------------------------------------------  Physical Exam: BP 130/64 (BP Location: Left Arm, Patient Position: Sitting, Cuff Size: Small)   Pulse 77   Ht 5\' 10"  (1.778 m)   Wt 237 lb 4 oz (107.6 kg)   SpO2 98%   BMI 34.04 kg/m   General:  NAD HEENT: No conjunctival pallor or scleral icterus. Facemask in place. Neck: Supple without lymphadenopathy, thyromegaly, JVD, or HJR. Lungs: Normal work of breathing. Clear to  auscultation bilaterally without wheezes or crackles. Heart: Regular rate and rhythm without murmurs, rubs, or gallops. Non-displaced PMI. Abd: Bowel sounds present. Soft, NT/ND without hepatosplenomegaly Ext: No lower extremity edema. Radial, PT, and DP pulses are 2+ bilaterally. Skin: Warm and dry without rash.  EKG:  Normal sinus rhythm with 1st degree AV block (PR interval 232 ms).  Low voltage.  Otherwise, no significant abnormality.  Lab Results  Component Value Date   WBC 7.7 04/20/2016   HGB 14.3 04/20/2016   HCT 41.6 04/20/2016   MCV 82.5 04/20/2016   PLT 193.0 04/20/2016    Lab Results  Component Value Date   NA 133 (L) 11/02/2018   K 3.8 11/02/2018   CL 95 (L) 11/02/2018   CO2 29 11/02/2018   BUN 17 11/02/2018   CREATININE 0.67 11/02/2018   GLUCOSE 173 (H) 11/02/2018   ALT 15 11/02/2018    Lab Results  Component Value Date   CHOL 146 12/19/2018   HDL 23.30 (L) 12/19/2018   LDLCALC 73 10/18/2017   LDLDIRECT 54.0 12/19/2018   TRIG (H) 12/19/2018    743.0 Triglyceride is over 400; calculations on Lipids are invalid.   CHOLHDL 6 12/19/2018    --------------------------------------------------------------------------------------------------  ASSESSMENT AND PLAN: Coronary artery disease with stable angina: Angina is well-controlled since we added amlodipine earlier this year.  No medication changes are planned today.  I agree with addition of Vascepa for secondary prevention in the setting of marked hypertriglyceridemia.  Continue high-intensity statin use as well.  Thoracic aortic aneurysm: Mild dilation noted on CTA a year ago.  We will plan for repeat CTA at the patient's earliest convenience to ensure stability.  Continue lipid control, blood pressure control, and aspirin.  Fluoroquinolones should be avoided, if possible.  Hypertension: BP upper borderline today (goal < 130/80) but usually lower at home.  No medication changes today.  Hyperlipidemia:  Continue atorvastatin and fenofibrate.  I agree with addition of Vascepa and will defer continued lipid monitoring to Dr. Caryl Bis.  Follow-up: Return to clinic in 6 months.  Nelva Bush, MD 12/22/2018 8:48 AM

## 2018-12-23 DIAGNOSIS — E119 Type 2 diabetes mellitus without complications: Secondary | ICD-10-CM | POA: Diagnosis not present

## 2018-12-23 LAB — BASIC METABOLIC PANEL
BUN/Creatinine Ratio: 14 (ref 10–24)
BUN: 9 mg/dL (ref 8–27)
CO2: 24 mmol/L (ref 20–29)
Calcium: 9.3 mg/dL (ref 8.6–10.2)
Chloride: 100 mmol/L (ref 96–106)
Creatinine, Ser: 0.65 mg/dL — ABNORMAL LOW (ref 0.76–1.27)
GFR calc Af Amer: 118 mL/min/{1.73_m2} (ref >59)
GFR calc non Af Amer: 102 mL/min/{1.73_m2} (ref >59)
Glucose: 236 mg/dL — ABNORMAL HIGH (ref 65–99)
Potassium: 3.9 mmol/L (ref 3.5–5.2)
Sodium: 137 mmol/L (ref 134–144)

## 2018-12-23 LAB — HM DIABETES EYE EXAM

## 2018-12-25 ENCOUNTER — Other Ambulatory Visit: Payer: Self-pay | Admitting: Family Medicine

## 2018-12-28 DIAGNOSIS — M5416 Radiculopathy, lumbar region: Secondary | ICD-10-CM | POA: Diagnosis not present

## 2019-01-02 ENCOUNTER — Other Ambulatory Visit: Payer: Self-pay

## 2019-01-04 ENCOUNTER — Ambulatory Visit
Admission: RE | Admit: 2019-01-04 | Discharge: 2019-01-04 | Disposition: A | Discharge status: Home / Self Care | Payer: Medicare Other | Source: Ambulatory Visit | Attending: Internal Medicine | Admitting: Internal Medicine

## 2019-01-04 ENCOUNTER — Other Ambulatory Visit: Payer: Self-pay

## 2019-01-04 ENCOUNTER — Other Ambulatory Visit: Payer: Self-pay | Admitting: Family Medicine

## 2019-01-04 DIAGNOSIS — M5442 Lumbago with sciatica, left side: Secondary | ICD-10-CM

## 2019-01-04 DIAGNOSIS — I712 Thoracic aortic aneurysm, without rupture, unspecified: Secondary | ICD-10-CM

## 2019-01-04 MED ORDER — IOHEXOL 350 MG/ML SOLN
75.0000 mL | Freq: Once | INTRAVENOUS | Status: AC | PRN
  Administered 2019-01-04: 09:00:00 75 mL via INTRAVENOUS

## 2019-01-04 NOTE — Telephone Encounter (Signed)
Refilled: 12/12/2018 Last OV: 11/02/2018 Next OV: 03/07/2019

## 2019-01-09 ENCOUNTER — Telehealth: Payer: Self-pay | Admitting: Internal Medicine

## 2019-01-09 ENCOUNTER — Other Ambulatory Visit: Payer: Self-pay

## 2019-01-09 MED ORDER — AMLODIPINE BESYLATE 2.5 MG PO TABS: 2.5000 mg | ORAL_TABLET | Freq: Every day | ORAL | 0 refills | Status: DC

## 2019-01-09 NOTE — Telephone Encounter (Signed)
*  STAT* If patient is at the pharmacy, call can be transferred to refill team.   1. Which medications need to be refilled? (please list name of each medication and dose if known) amlodipine 25 mg daily  2. Which pharmacy/location (including street and city if local pharmacy) is medication to be sent to? [facility]   3. Do they need a 30 day or 90 day supply? [place]

## 2019-01-09 NOTE — Telephone Encounter (Signed)
amLODipine (NORVASC) 2.5 MG tablet 90 tablet 0 01/09/2019    Sig - Route: Take 1 tablet (2.5 mg total) by mouth daily. - Oral   Sent to pharmacy as: amLODipine (NORVASC) 2.5 MG tablet   E-Prescribing Status: Receipt confirmed by pharmacy (01/09/2019 12:01 PM EST)   [place], [place]

## 2019-01-10 ENCOUNTER — Other Ambulatory Visit: Payer: Self-pay | Admitting: *Deleted

## 2019-01-10 DIAGNOSIS — I712 Thoracic aortic aneurysm, without rupture, unspecified: Secondary | ICD-10-CM

## 2019-01-15 ENCOUNTER — Other Ambulatory Visit: Payer: Self-pay | Admitting: Family Medicine

## 2019-01-15 DIAGNOSIS — E785 Hyperlipidemia, unspecified: Secondary | ICD-10-CM

## 2019-01-24 ENCOUNTER — Other Ambulatory Visit: Payer: Self-pay | Admitting: Family Medicine

## 2019-01-24 ENCOUNTER — Other Ambulatory Visit: Payer: Self-pay | Admitting: Internal Medicine

## 2019-02-05 ENCOUNTER — Other Ambulatory Visit: Payer: Self-pay | Admitting: Internal Medicine

## 2019-02-10 ENCOUNTER — Other Ambulatory Visit: Payer: Self-pay | Admitting: Family Medicine

## 2019-02-10 DIAGNOSIS — M5442 Lumbago with sciatica, left side: Secondary | ICD-10-CM

## 2019-02-16 DIAGNOSIS — M48062 Spinal stenosis, lumbar region with neurogenic claudication: Secondary | ICD-10-CM | POA: Diagnosis not present

## 2019-03-07 ENCOUNTER — Other Ambulatory Visit: Payer: Self-pay

## 2019-03-07 ENCOUNTER — Telehealth: Payer: Self-pay | Admitting: Family Medicine

## 2019-03-07 ENCOUNTER — Ambulatory Visit (INDEPENDENT_AMBULATORY_CARE_PROVIDER_SITE_OTHER): Payer: Medicare Other | Admitting: Family Medicine

## 2019-03-07 ENCOUNTER — Encounter: Payer: Self-pay | Admitting: Family Medicine

## 2019-03-07 DIAGNOSIS — I10 Essential (primary) hypertension: Secondary | ICD-10-CM | POA: Diagnosis not present

## 2019-03-07 DIAGNOSIS — M545 Low back pain: Secondary | ICD-10-CM | POA: Diagnosis not present

## 2019-03-07 DIAGNOSIS — E119 Type 2 diabetes mellitus without complications: Secondary | ICD-10-CM

## 2019-03-07 DIAGNOSIS — E785 Hyperlipidemia, unspecified: Secondary | ICD-10-CM

## 2019-03-07 DIAGNOSIS — G8929 Other chronic pain: Secondary | ICD-10-CM

## 2019-03-07 DIAGNOSIS — I77810 Thoracic aortic ectasia: Secondary | ICD-10-CM

## 2019-03-07 DIAGNOSIS — I712 Thoracic aortic aneurysm, without rupture, unspecified: Secondary | ICD-10-CM

## 2019-03-07 MED ORDER — ICOSAPENT ETHYL 1 G PO CAPS: 2.0000 g | ORAL_CAPSULE | Freq: Two times a day (BID) | ORAL | 3 refills | Status: DC

## 2019-03-07 NOTE — Progress Notes (Signed)
Virtual Visit via telephone Note  This visit type was conducted due to national recommendations for restrictions regarding the COVID-19 pandemic (e.g. social distancing).  This format is felt to be most appropriate for this patient at this time.  All issues noted in this document were discussed and addressed.  No physical exam was performed (except for noted visual exam findings with Video Visits).   I connected with Walter Salas today at  8:30 AM EST by a video enabled telemedicine application or telephone and verified that I am speaking with the correct person using two identifiers. Location patient: home Location provider: work Persons participating in the virtual visit: patient, provider  I discussed the limitations, risks, security and privacy concerns of performing an evaluation and management service by telephone and the availability of in person appointments. I also discussed with the patient that there may be a patient responsible charge related to this service. The patient expressed understanding and agreed to proceed.  Interactive audio and video telecommunications were attempted between this provider and patient, however failed, due to patient having technical difficulties OR patient did not have access to video capability.  We continued and completed visit with audio only.  Reason for visit: follow-up  HPI: HYPERTENSION Disease Monitoring: Blood pressure range-138/78  Chest pain- no      Dyspnea- no Medications: Compliance- taking amlodipine, chlorthalidone, losartan   Edema- no  DIABETES Disease Monitoring: Blood Sugar ranges-180 is the highest it has been Polyuria/phagia/dipsia- no      Optho- UTD Medications: Compliance- taking januvia, metformin Hypoglycemic symptoms- no  HYPERLIPIDEMIA Disease Monitoring: See symptoms for Hypertension Medications: Compliance- taking vascepa, lipitor, fenofibrate Right upper quadrant pain- no  Muscle aches- no  Chronic low back  pain: Has been following with orthopedics.  They are planning on doing a laminectomy in early March.  He has been having radiating pain to his legs.  No numbness or weakness.  He had several nerve blocks which were not beneficial.    ROS: See pertinent positives and negatives per HPI.  Past Medical History:  Diagnosis Date  . Agent orange exposure   . Anginal pain ([place])   . Arthritis   . CAD (coronary artery disease)    a. s/p PCI/DES to the LCx and RCA in 2014  . Cancer ([place])    skin  . CHF (congestive heart failure) ([place])   . Chronic cough   . Chronic pain   . Colon polyp   . Depression   . Diabetes mellitus without complication ([place])   . Diverticulosis   . History of kidney stones   . History of stomach ulcers   . HOH (hard of hearing)   . Hx of laminectomy 1999  . Hyperlipidemia   . Hypertension   . Kidney stone   . Myocardial infarction ([place])   . Sleep apnea   . Syncope    a. Event monitor 2018: NSR with first-degree AV block, no significant arrhythmias or pauses; b.  Myoview showed a small in size, mild in severity fixed basal and anterior lateral defect, no ischemia, LVEF 55 to 65%, low risk study  . Thoracic aortic aneurysm ([place])    a. TTE 2018: EF 60-65%, normal wall motion, Gr1DD, mild aortic valve calcification, mildly dilated aortic root measuring up to 4.2 cm, normal RV cavity size and systolic function, normal PASP; b. CTA aorta 6/18:showed an ascending thoracic aortic aneurysmal dilatation with a maximum diameter of 4.3 cm  . Ulcer  Past Surgical History:  Procedure Laterality Date  . [place]  . CARPAL TUNNEL RELEASE Bilateral 1990  . CATARACT EXTRACTION W/PHACO Left 03/08/2018   Procedure: CATARACT EXTRACTION PHACO AND INTRAOCULAR LENS PLACEMENT ([place]) LEFT;  Surgeon: Birder Robson, MD;  Location: [facility] ORS;  Service: Ophthalmology;  Laterality: Left;  [place]  00:24.2 CDE 3.25 Fluid pack lot # [id number]  . CATARACT EXTRACTION  W/PHACO Right 05/24/2018   Procedure: CATARACT EXTRACTION PHACO AND INTRAOCULAR LENS PLACEMENT (IOC) RIGHT;  Surgeon: Birder Robson, MD;  Location: [place];  Service: Ophthalmology;  Laterality: Right;  Diabetic - oral meds  . CERVICAL FUSION  2000. 2003  . CHOLECYSTECTOMY  2000  . COLONOSCOPY WITH PROPOFOL N/A 02/23/2017   Procedure: COLONOSCOPY WITH PROPOFOL;  Surgeon: Lollie Sails, MD;  Location[facility] ENDOSCOPY;  Service: Endoscopy;  Laterality: N/A;  . CORONARY ANGIOPLASTY WITH STENT PLACEMENT  2014   [facility] [employer]  . ESOPHAGOGASTRODUODENOSCOPY (EGD) WITH PROPOFOL N/A 02/23/2017   Procedure: ESOPHAGOGASTRODUODENOSCOPY (EGD) WITH PROPOFOL;  Surgeon: Lollie Sails, MD;  Location: [facility] ENDOSCOPY;  Service: Endoscopy;  Laterality: N/A;  . SHOULDER ARTHROSCOPY Right 2012  . TUMOR EXCISION  2007    Family History  Problem Relation Age of Onset  . Diabetes Mother   . Heart disease Father   . Diabetes Father   . Diabetes Sister   . Heart disease Sister   . Diabetes Brother   . Heart disease Brother   . Heart disease Paternal Grandfather     SOCIAL HX: Former smoker   Current Outpatient Medications:  .  amLODipine (NORVASC) 2.5 MG tablet, TAKE 1 TABLET BY MOUTH EVERY DAY, Disp: 90 tablet, Rfl: 1 .  aspirin 81 MG tablet, Take 81 mg by mouth daily., Disp: , Rfl:  .  atorvastatin (LIPITOR) 80 MG tablet, Take 80 mg by mouth at bedtime., Disp: , Rfl:  .  chlorthalidone (HYGROTON) 50 MG tablet, Take 25 mg by mouth daily., Disp: , Rfl:  .  cholecalciferol (VITAMIN D) 1000 UNITS tablet, Take 1 tablet (1,000 Units total) by mouth QID. (Patient taking differently: Take 4,000 Units by mouth daily. ), Disp: 90 tablet, Rfl: 0 .  cyclobenzaprine (FLEXERIL) 10 MG tablet, Take 10 mg by mouth 2 (two) times daily., Disp: , Rfl:  .  DULoxetine (CYMBALTA) 20 MG capsule, Take 20 mg by mouth daily., Disp: , Rfl:  .  fenofibrate (TRICOR) 145 MG tablet,  Take 1 tablet (145 mg total) by mouth daily., Disp: 30 tablet, Rfl: 0 .  glucose blood test strip, Check once daily, E11.9, Disp: 100 each, Rfl: 12 .  icosapent Ethyl (VASCEPA) 1 g capsule, Take 2 capsules (2 g total) by mouth 2 (two) times daily., Disp: 120 capsule, Rfl: 3 .  JANUVIA 100 MG tablet, TAKE ONE TABLET EVERY DAY, Disp: 90 tablet, Rfl: 1 .  lidocaine (LIDODERM) 5 %, Place 1 patch onto the skin daily. Remove & Discard patch within 12 hours or as directed by MD (Patient taking differently: Place 1 patch onto the skin daily as needed (pain). Remove & Discard patch within 12 hours or as directed by MD), Disp: 30 patch, Rfl: 0 .  losartan (COZAAR) 25 MG tablet, Take 25 mg by mouth daily., Disp: , Rfl:  .  metFORMIN (GLUCOPHAGE) 1000 MG tablet, Take 1,000 mg by mouth 2 (two) times daily with a meal., Disp: , Rfl:  .  methocarbamol (ROBAXIN) 500 MG tablet,  Take 500 mg by mouth 2 (two) times daily as needed for muscle spasms. , Disp: , Rfl:  .  methyl salicylate liquid, Apply 1 application topically as needed for muscle pain., Disp: , Rfl:  .  methylPREDNISolone (MEDROL DOSEPAK) 4 MG TBPK tablet, Take according to pack instructions, Disp: 21 tablet, Rfl: 0 .  nitroGLYCERIN (NITROSTAT) 0.4 MG SL tablet, Place 1 tablet (0.4 mg total) under the tongue every 5 (five) minutes as needed for chest pain. May take up to 3 doses., Disp: 30 tablet, Rfl: 0 .  omeprazole (PRILOSEC) 20 MG capsule, Take 20 mg by mouth daily., Disp: , Rfl:  .  polyvinyl alcohol (LIQUIFILM TEARS) 1.4 % ophthalmic solution, Place 1 drop into both eyes as needed for dry eyes. , Disp: , Rfl:  .  potassium chloride SA (K-DUR,KLOR-CON) 20 MEQ tablet, Take 1 tablet (20 mEq total) by mouth daily., Disp: 90 tablet, Rfl: 3 .  pregabalin (LYRICA) 50 MG capsule, Take 1 capsule (50 mg total) by mouth 3 (three) times daily., Disp: 30 capsule, Rfl: 0 .  traMADol (ULTRAM) 50 MG tablet, TAKE TWO TABLETS EVERY 8 HOURS AS NEEDEDFOR MODERATE PAIN,  Disp: 90 tablet, Rfl: 0 .  traZODone (DESYREL) 100 MG tablet, Take 100 mg by mouth at bedtime as needed for sleep., Disp: , Rfl:  .  vitamin B-12 (CYANOCOBALAMIN) 1000 MCG tablet, Take 1 tablet (1,000 mcg total) by mouth daily., Disp: 30 tablet, Rfl: 0  EXAM: This was a telehealth telephone visit and thus no physical exam was completed.  ASSESSMENT AND PLAN:  Discussed the following assessment and plan:  Essential hypertension Decent control.  Continue current regimen.  Check CMP.  Diabetes mellitus, type 2 (HCC) Check A1c.  Continue current regimen.  Hyperlipidemia LDL goal <70 Triglycerides previously uncontrolled.  He will continue his current medications.  We will recheck a lipid panel.  Chronic back pain Chronic issue.  He will continue to follow with orthopedics.  Thoracic aortic aneurysm without rupture ([place]) Stable on recent CT angiogram.  He will continue to see cardiology for this.   No orders of the defined types were placed in this encounter.   Meds ordered this encounter  Medications  . icosapent Ethyl (VASCEPA) 1 g capsule    Sig: Take 2 capsules (2 g total) by mouth 2 (two) times daily.    Dispense:  120 capsule    Refill:  3     I discussed the assessment and treatment plan with the patient. The patient was provided an opportunity to ask questions and all were answered. The patient agreed with the plan and demonstrated an understanding of the instructions.   The patient was advised to call back or seek an in-person evaluation if the symptoms worsen or if the condition fails to improve as anticipated.  I provided 8 minutes of non-face-to-face time during this encounter.   Tommi Rumps, MD

## 2019-03-07 NOTE — Assessment & Plan Note (Signed)
Chronic issue.  He will continue to follow with orthopedics.

## 2019-03-07 NOTE — Assessment & Plan Note (Signed)
Stable on recent CT angiogram.  He will continue to see cardiology for this.

## 2019-03-07 NOTE — Telephone Encounter (Signed)
Labs ordered.  Ifrah Vest,cma  

## 2019-03-07 NOTE — Assessment & Plan Note (Signed)
Triglycerides previously uncontrolled.  He will continue his current medications.  We will recheck a lipid panel.

## 2019-03-07 NOTE — Telephone Encounter (Signed)
Dr. Caryl Bis want's patient to have labs in 3 days. No orders in chart.

## 2019-03-07 NOTE — Assessment & Plan Note (Signed)
Check A1c.  Continue current regimen. 

## 2019-03-07 NOTE — Assessment & Plan Note (Signed)
Decent control.  Continue current regimen.  Check CMP.

## 2019-03-10 ENCOUNTER — Other Ambulatory Visit: Payer: Self-pay

## 2019-03-10 ENCOUNTER — Other Ambulatory Visit (INDEPENDENT_AMBULATORY_CARE_PROVIDER_SITE_OTHER): Payer: Medicare Other

## 2019-03-10 DIAGNOSIS — E785 Hyperlipidemia, unspecified: Secondary | ICD-10-CM | POA: Diagnosis not present

## 2019-03-10 DIAGNOSIS — E119 Type 2 diabetes mellitus without complications: Secondary | ICD-10-CM

## 2019-03-10 DIAGNOSIS — I10 Essential (primary) hypertension: Secondary | ICD-10-CM

## 2019-03-10 LAB — LIPID PANEL
Cholesterol: 207 mg/dL — ABNORMAL HIGH (ref 0–200)
HDL: 25.6 mg/dL — ABNORMAL LOW (ref >39.00)
Total CHOL/HDL Ratio: 8
Triglycerides: 961 mg/dL — ABNORMAL HIGH (ref 0.0–149.0)

## 2019-03-10 LAB — COMPREHENSIVE METABOLIC PANEL
ALT: 19 U/L (ref 0–53)
AST: 15 U/L (ref 0–37)
Albumin: 4.4 g/dL (ref 3.5–5.2)
Alkaline Phosphatase: 64 U/L (ref 39–117)
BUN: 15 mg/dL (ref 6–23)
CO2: 32 mEq/L (ref 19–32)
Calcium: 9.8 mg/dL (ref 8.4–10.5)
Chloride: 96 mEq/L (ref 96–112)
Creatinine, Ser: 0.81 mg/dL (ref 0.40–1.50)
GFR: 95.54 mL/min (ref >60.00)
Glucose, Bld: 153 mg/dL — ABNORMAL HIGH (ref 70–99)
Potassium: 3.7 mEq/L (ref 3.5–5.1)
Sodium: 136 mEq/L (ref 135–145)
Total Bilirubin: 0.4 mg/dL (ref 0.2–1.2)
Total Protein: 7.5 g/dL (ref 6.0–8.3)

## 2019-03-10 LAB — HEMOGLOBIN A1C: Hgb A1c MFr Bld: 7.9 % — ABNORMAL HIGH (ref 4.6–6.5)

## 2019-03-10 LAB — LDL CHOLESTEROL, DIRECT: Direct LDL: 66 mg/dL

## 2019-03-14 ENCOUNTER — Other Ambulatory Visit: Payer: Self-pay | Admitting: Family Medicine

## 2019-03-14 DIAGNOSIS — M5442 Lumbago with sciatica, left side: Secondary | ICD-10-CM

## 2019-03-15 NOTE — Telephone Encounter (Signed)
Refill request forTramadol, last seen 03-07-19, last filled 02-12-19.  Please advise.

## 2019-03-16 ENCOUNTER — Telehealth: Payer: Self-pay | Admitting: Family Medicine

## 2019-03-16 ENCOUNTER — Other Ambulatory Visit: Payer: Self-pay | Admitting: Family Medicine

## 2019-03-16 DIAGNOSIS — M5442 Lumbago with sciatica, left side: Secondary | ICD-10-CM

## 2019-03-16 NOTE — Telephone Encounter (Signed)
Pt called returning your call about his lab results

## 2019-03-23 DIAGNOSIS — M5126 Other intervertebral disc displacement, lumbar region: Secondary | ICD-10-CM | POA: Diagnosis not present

## 2019-03-24 DIAGNOSIS — M5126 Other intervertebral disc displacement, lumbar region: Secondary | ICD-10-CM | POA: Diagnosis not present

## 2019-03-24 DIAGNOSIS — M48062 Spinal stenosis, lumbar region with neurogenic claudication: Secondary | ICD-10-CM | POA: Diagnosis not present

## 2019-03-24 DIAGNOSIS — Z01818 Encounter for other preprocedural examination: Secondary | ICD-10-CM | POA: Diagnosis not present

## 2019-03-24 NOTE — Telephone Encounter (Signed)
Patient called back and received lab results.  Hanadi Stanly,cma

## 2019-03-27 DIAGNOSIS — Z01812 Encounter for preprocedural laboratory examination: Secondary | ICD-10-CM | POA: Diagnosis not present

## 2019-03-29 DIAGNOSIS — E7800 Pure hypercholesterolemia, unspecified: Secondary | ICD-10-CM | POA: Diagnosis not present

## 2019-03-29 DIAGNOSIS — K219 Gastro-esophageal reflux disease without esophagitis: Secondary | ICD-10-CM | POA: Diagnosis not present

## 2019-03-29 DIAGNOSIS — Z888 Allergy status to other drugs, medicaments and biological substances status: Secondary | ICD-10-CM | POA: Diagnosis not present

## 2019-03-29 DIAGNOSIS — M48061 Spinal stenosis, lumbar region without neurogenic claudication: Secondary | ICD-10-CM | POA: Diagnosis not present

## 2019-03-29 DIAGNOSIS — G4733 Obstructive sleep apnea (adult) (pediatric): Secondary | ICD-10-CM | POA: Diagnosis not present

## 2019-03-29 DIAGNOSIS — I10 Essential (primary) hypertension: Secondary | ICD-10-CM | POA: Diagnosis not present

## 2019-03-29 DIAGNOSIS — Z955 Presence of coronary angioplasty implant and graft: Secondary | ICD-10-CM | POA: Diagnosis not present

## 2019-03-29 DIAGNOSIS — E119 Type 2 diabetes mellitus without complications: Secondary | ICD-10-CM | POA: Diagnosis not present

## 2019-03-29 DIAGNOSIS — M5126 Other intervertebral disc displacement, lumbar region: Secondary | ICD-10-CM | POA: Diagnosis not present

## 2019-03-29 DIAGNOSIS — M4726 Other spondylosis with radiculopathy, lumbar region: Secondary | ICD-10-CM | POA: Diagnosis not present

## 2019-03-29 DIAGNOSIS — Z7982 Long term (current) use of aspirin: Secondary | ICD-10-CM | POA: Diagnosis not present

## 2019-03-29 DIAGNOSIS — I252 Old myocardial infarction: Secondary | ICD-10-CM | POA: Diagnosis not present

## 2019-03-29 DIAGNOSIS — Z7984 Long term (current) use of oral hypoglycemic drugs: Secondary | ICD-10-CM | POA: Diagnosis not present

## 2019-04-21 ENCOUNTER — Telehealth: Payer: Self-pay | Admitting: Family Medicine

## 2019-04-21 NOTE — Telephone Encounter (Signed)
Pt returned your call about Ozempic

## 2019-04-25 MED ORDER — OZEMPIC (0.25 OR 0.5 MG/DOSE) 2 MG/1.5ML ~~LOC~~ SOPN: 0.2500 mg | PEN_INJECTOR | SUBCUTANEOUS | 3 refills | Status: DC

## 2019-04-25 NOTE — Telephone Encounter (Signed)
I reached the patient and asked if he had any history of thyroid, parathyroid or  Adrenal gland cancer and he does not you can send the Ozempic to [facility] pharmacy.  Xzavian Semmel,cma

## 2019-04-25 NOTE — Telephone Encounter (Signed)
Ozempic sent in.  Patient can discontinue the Januvia once he starts the Ozempic.

## 2019-04-25 NOTE — Addendum Note (Signed)
Addended by: Caryl Bis, Jeannia Tatro G on: 04/25/2019 03:01 PM   Modules accepted: Orders

## 2019-05-11 DIAGNOSIS — M5416 Radiculopathy, lumbar region: Secondary | ICD-10-CM | POA: Diagnosis not present

## 2019-05-17 ENCOUNTER — Other Ambulatory Visit: Payer: Self-pay | Admitting: Internal Medicine

## 2019-05-22 DIAGNOSIS — M5416 Radiculopathy, lumbar region: Secondary | ICD-10-CM | POA: Diagnosis not present

## 2019-06-13 DIAGNOSIS — M5416 Radiculopathy, lumbar region: Secondary | ICD-10-CM | POA: Diagnosis not present

## 2019-06-13 DIAGNOSIS — E119 Type 2 diabetes mellitus without complications: Secondary | ICD-10-CM | POA: Diagnosis not present

## 2019-06-26 NOTE — Progress Notes (Signed)
Follow-up Outpatient Visit Date: 06/28/2019  Primary Care Provider: Leone Haven, MD [address] [place] [address]  Chief Complaint: Back and left leg pain  HPI:  Walter Salas is a 64 y.o. male with history of coronary artery disease status post PCI's to the LCx and RCA in 2014, acsending aortic aneurysm, HTN, HLD, type II DM, depression, renal stones, and fall with intracranial hemorrhage, who presents for follow-up of coronary artery disease and thoracic aortic aneurysm.  I last saw him in 12/2018, at which time he was doing well.  He noted two episodes of vague chest pain over the preceding year, though chest discomfort was overall under good control with addition of amlodipine.  We agreed to add Vascepa due to significant hypertriglyceridemia.  Subsequent CTA of the chest showed stable 4.3 cm aortic root aneurysm.  Today, Walter Salas reports he has been doing well from a heart standpoint without chest pain, shortness of breath, palpitations, lightheadedness, and edema.  He underwent lumbar spine laminectomy in [place] ([facility]) in March and continues to have low back and left leg pain.  He is scheduled for follow-up with his spine surgeon later this week.  His mobility remains limited on account of this.  He is tolerating Vascepa well and reports that he had a lipid panel last month through the [facility].  He notes that his home blood pressures are usually around 120/70.  --------------------------------------------------------------------------------------------------  Past Medical History:  Diagnosis Date  . Agent orange exposure   . Anginal pain ([place])   . Arthritis   . CAD (coronary artery disease)    a. s/p PCI/DES to the LCx and RCA in 2014  . Cancer ([place])    skin  . CHF (congestive heart failure) ([place])   . Chronic cough   . Chronic pain   . Colon polyp   . Depression   . Diabetes mellitus without complication ([place])   . Diverticulosis   . History  of kidney stones   . History of stomach ulcers   . HOH (hard of hearing)   . Hx of laminectomy 1999  . Hyperlipidemia   . Hypertension   . Kidney stone   . Myocardial infarction ([place])   . Sleep apnea   . Syncope    a. Event monitor 2018: NSR with first-degree AV block, no significant arrhythmias or pauses; b.  Myoview showed a small in size, mild in severity fixed basal and anterior lateral defect, no ischemia, LVEF 55 to 65%, low risk study  . Thoracic aortic aneurysm ([place])    a. TTE 2018: EF 60-65%, normal wall motion, Gr1DD, mild aortic valve calcification, mildly dilated aortic root measuring up to 4.2 cm, normal RV cavity size and systolic function, normal PASP; b. CTA aorta 6/18:showed an ascending thoracic aortic aneurysmal dilatation with a maximum diameter of 4.3 cm  . Ulcer    Past Surgical History:  Procedure Laterality Date  . [place]  . CARPAL TUNNEL RELEASE Bilateral 1990  . CATARACT EXTRACTION W/PHACO Left 03/08/2018   Procedure: CATARACT EXTRACTION PHACO AND INTRAOCULAR LENS PLACEMENT ([place]) LEFT;  Surgeon: Birder Robson, MD;  Location: [facility] OR[facility];  Service: Ophthalmology;  Laterality: Left;  [place]  00:24.2 CDE 3.25 Fluid pack lot # [id number]  . CATARACT EXTRACTION W/PHACO Right 05/24/2018   Procedure: CATARACT EXTRACTION PHACO AND INTRAOCULAR LENS PLACEMENT ([facility]) RIGHT;  Surgeon: Birder Robson, MD;  Location: [place];  Service: Ophthalmology;  Laterality:  Right;  Diabetic - oral meds  . CERVICAL FUSION  2000. 2003  . CHOLECYSTECTOMY  2000  . COLONOSCOPY WITH PROPOFOL N/A 02/23/2017   Procedure: COLONOSCOPY WITH PROPOFOL;  Surgeon: Lollie Sails, MD;  Location: [facility] ENDOSCOPY;  Service: Endoscopy;  Laterality: N/A;  . CORONARY ANGIOPLASTY WITH STENT PLACEMENT  2014   [facility] [employer]  . ESOPHAGOGASTRODUODENOSCOPY (EGD) WITH PROPOFOL N/A 02/23/2017   Procedure: ESOPHAGOGASTRODUODENOSCOPY (EGD) WITH  PROPOFOL;  Surgeon: Lollie Sails, MD;  Location: [facility] ENDOSCOPY;  Service: Endoscopy;  Laterality: N/A;  . SHOULDER ARTHROSCOPY Right 2012  . TUMOR EXCISION  2007    Current Meds  Medication Sig  . amLODipine (NORVASC) 2.5 MG tablet TAKE ONE TABLET BY MOUTH EVERY DAY  . aspirin 81 MG tablet Take 81 mg by mouth daily.  Marland Kitchen atorvastatin (LIPITOR) 80 MG tablet Take 80 mg by mouth at bedtime.  . chlorthalidone (HYGROTON) 50 MG tablet Take 25 mg by mouth daily.  . cyclobenzaprine (FLEXERIL) 10 MG tablet Take 10 mg by mouth 2 (two) times daily.  . DULoxetine (CYMBALTA) 20 MG capsule Take 20 mg by mouth daily.  . fenofibrate (TRICOR) 145 MG tablet Take 1 tablet (145 mg total) by mouth daily.  Marland Kitchen glucose blood test strip Check once daily, E11.9  . icosapent Ethyl (VASCEPA) 1 g capsule Take 2 capsules (2 g total) by mouth 2 (two) times daily.  Marland Kitchen JANUVIA 100 MG tablet TAKE ONE TABLET EVERY DAY  . lidocaine (LIDODERM) 5 % 1 patch daily as needed-Remove & Discard patch within 12 hours or as directed by MD  . losartan (COZAAR) 25 MG tablet Take 25 mg by mouth daily.  . metFORMIN (GLUCOPHAGE) 1000 MG tablet Take 1,000 mg by mouth 2 (two) times daily with a meal.  . methocarbamol (ROBAXIN) 500 MG tablet Take 500 mg by mouth 2 (two) times daily as needed for muscle spasms.   . methyl salicylate liquid Apply 1 application topically as needed for muscle pain.  . methylPREDNISolone (MEDROL DOSEPAK) 4 MG TBPK tablet Take according to pack instructions  . nitroGLYCERIN (NITROSTAT) 0.4 MG SL tablet Place 1 tablet (0.4 mg total) under the tongue every 5 (five) minutes as needed for chest pain. May take up to 3 doses.  Marland Kitchen omeprazole (PRILOSEC) 20 MG capsule Take 20 mg by mouth daily.  . polyvinyl alcohol (LIQUIFILM TEARS) 1.4 % ophthalmic solution Place 1 drop into both eyes as needed for dry eyes.   . potassium chloride SA (K-DUR,KLOR-CON) 20 MEQ tablet Take 1 tablet (20 mEq total) by mouth daily.  .  pregabalin (LYRICA) 50 MG capsule Take 1 capsule (50 mg total) by mouth 3 (three) times daily.  . Semaglutide,0.25 or 0.5MG /DOS, (OZEMPIC, 0.25 OR 0.5 MG/DOSE,) 2 MG/1.5ML SOPN Inject 0.25 mg into the skin once a week.  . traMADol (ULTRAM) 50 MG tablet TAKE TWO TABLETS EVERY 8 HOURS AS NEEDEDFOR MODERATE PAIN  . traZODone (DESYREL) 100 MG tablet Take 100 mg by mouth at bedtime as needed for sleep.  . vitamin B-12 (CYANOCOBALAMIN) 1000 MCG tablet Take 1 tablet (1,000 mcg total) by mouth daily.  Marland Kitchen VITAMIN D, CHOLECALCIFEROL, PO 4000 IU daily    Allergies: Gabapentin, Isosorbide mononitrate er [isosorbide dinitrate], and Tylenol [acetaminophen]  Social History   Tobacco Use  . Smoking status: Current Every Day Smoker    Packs/day: 1.00    Years: 50.00    Pack years: 50.00    Types: Cigarettes  Last attempt to quit: 02/15/2018    Years since quitting: 1.3  . Smokeless tobacco: Never Used  Vaping Use  . Vaping Use: Never used  Substance Use Topics  . Alcohol use: No  . Drug use: No    Family History  Problem Relation Age of Onset  . Diabetes Mother   . Heart disease Father   . Diabetes Father   . Diabetes Sister   . Heart disease Sister   . Diabetes Brother   . Heart disease Brother   . Heart disease Paternal Grandfather     Review of Systems: A 12-system review of systems was performed and was negative except as noted in the HPI.  --------------------------------------------------------------------------------------------------  Physical Exam: BP 140/60 (BP Location: Left Arm, Patient Position: Sitting, Cuff Size: Normal)   Pulse 86   Ht 5\' 10"  (1.778 m)   Wt 223 lb 6 oz (101.3 kg)   SpO2 95%   BMI 32.05 kg/m   General: NAD. Neck: No JVD or HJR. Lungs: Mildly diminished breath sounds throughout without wheezes or crackles. Heart: Regular rate and rhythm without murmurs, rubs, or gallops. Abdomen: Soft, nontender, nondistended. Extremities: No lower extremity  edema.  EKG: Normal sinus rhythm with low voltage.  Otherwise, no significant abnormality.  PR interval has shortened from prior tracing on 12/22/2018.  Otherwise, no significant change.  Lab Results  Component Value Date   WBC 7.7 04/20/2016   HGB 14.3 04/20/2016   HCT 41.6 04/20/2016   MCV 82.5 04/20/2016   PLT 193.0 04/20/2016    Lab Results  Component Value Date   NA 136 03/10/2019   K 3.7 03/10/2019   CL 96 03/10/2019   CO2 32 03/10/2019   BUN 15 03/10/2019   CREATININE 0.81 03/10/2019   GLUCOSE 153 (H) 03/10/2019   ALT 19 03/10/2019    Lab Results  Component Value Date   CHOL 207 (H) 03/10/2019   HDL 25.60 (L) 03/10/2019   LDLCALC 73 10/18/2017   LDLDIRECT 66.0 03/10/2019   TRIG (H) 03/10/2019    961.0 Triglyceride is over 400; calculations on Lipids are invalid.   CHOLHDL 8 03/10/2019   --------------------------------------------------------------------------------------------------  ASSESSMENT AND PLAN: Coronary artery disease with stable angina: Mr. Vanbenschoten continues to do well without recurrent chest pain on our current antianginal regimen of low-dose amlodipine added last year.  We will continue with indefinite aspirin as well as focus on secondary prevention with tobacco cessation, blood pressure control, and improved lipid therapy.  Hypertension: Blood pressure mildly elevated today but typically better at home (goal less than 130/80).  We will defer medication changes today.  I encouraged Walter Salas to minimize his sodium intake and increase his activity, as his back and leg pain allow.  Hyperlipidemia: Walter Salas is tolerating atorvastatin, fenofibrate, and Vascepa well.  We we will request recent lipid panel results from the [place] to ensure adequate response to therapy.  Thoracic aortic aneurysm: No symptoms reported by Walter Salas.  Most recent CTA of the chest in December, 2020, showed stable mild enlargement of the aortic root.  We will plan to  repeat CTA of the chest in December, shortly before our follow-up visit.  Continue blood pressure control and lipid therapy, as outlined above.  Follow-up: Return to clinic in 6 months.  Walter Bush, MD 06/28/2019 10:17 AM

## 2019-06-28 ENCOUNTER — Encounter: Payer: Self-pay | Admitting: Internal Medicine

## 2019-06-28 ENCOUNTER — Ambulatory Visit: Payer: Medicare Other | Admitting: Internal Medicine

## 2019-06-28 ENCOUNTER — Other Ambulatory Visit: Payer: Self-pay

## 2019-06-28 VITALS — BP 140/60 | HR 86 | Ht 70.0 in | Wt 223.4 lb

## 2019-06-28 DIAGNOSIS — I25118 Atherosclerotic heart disease of native coronary artery with other forms of angina pectoris: Secondary | ICD-10-CM | POA: Diagnosis not present

## 2019-06-28 DIAGNOSIS — I712 Thoracic aortic aneurysm, without rupture, unspecified: Secondary | ICD-10-CM

## 2019-06-28 DIAGNOSIS — I10 Essential (primary) hypertension: Secondary | ICD-10-CM

## 2019-06-28 DIAGNOSIS — E782 Mixed hyperlipidemia: Secondary | ICD-10-CM

## 2019-06-28 NOTE — Patient Instructions (Addendum)
Medication Instructions:  Your physician recommends that you continue on your current medications as directed. Please refer to the Current Medication list given to you today.  *If you need a refill on your cardiac medications before your next appointment, please call your pharmacy*   Lab Work: Worthy Keeler will be requested from your primary care physician.  If you have labs (blood work) drawn today and your tests are completely normal, you will receive your results only by:  [place] (if you have MyChart) OR  A paper copy in the mail If you have any lab test that is abnormal or we need to change your treatment, we will call you to review the results.   Testing/Procedures:  Non-Cardiac CT Angiography (CTA) OF THE CHEST/AORTA, is a special type of CT scan that uses a computer to produce multi-dimensional views of major blood vessels throughout the body. In CT angiography, a contrast material is injected through an IV to help visualize the blood vessels  Please call [phone] to schedule. Will need to be sometime in December prior to your return to see Dr End.    Follow-Up: At [facility], you and your health needs are our priority.  As part of our continuing mission to provide you with exceptional heart care, we have created designated Provider Care Teams.  These Care Teams include your primary Cardiologist (physician) and Advanced Practice Providers (APPs -  Physician Assistants and Nurse Practitioners) who all work together to provide you with the care you need, when you need it.  We recommend signing up for the patient portal called "MyChart".  Sign up information is provided on this After Visit Summary.  MyChart is used to connect with patients for Virtual Visits (Telemedicine).  Patients are able to view lab/test results, encounter notes, upcoming appointments, etc.  Non-urgent messages can be sent to your provider as well.   To learn more about what you can do with MyChart,  go to [email or web address].    Your next appointment:   6 month(s)  The format for your next appointment:   In Person  Provider:    You may see Nelva Bush, MD or one of the following Advanced Practice Providers on your designated Care Team:    Murray Hodgkins, NP  Christell Faith, PA-C  Marrianne Mood, PA-C

## 2019-06-30 DIAGNOSIS — M5416 Radiculopathy, lumbar region: Secondary | ICD-10-CM | POA: Diagnosis not present

## 2019-07-03 NOTE — Addendum Note (Signed)
Addended by: Janan Ridge on: 07/03/2019 04:24 PM   Modules accepted: Orders

## 2019-07-05 ENCOUNTER — Ambulatory Visit: Payer: Medicare Other

## 2019-07-06 ENCOUNTER — Telehealth (INDEPENDENT_AMBULATORY_CARE_PROVIDER_SITE_OTHER): Payer: Medicare Other | Admitting: Family Medicine

## 2019-07-06 ENCOUNTER — Other Ambulatory Visit: Payer: Self-pay

## 2019-07-06 ENCOUNTER — Encounter: Payer: Self-pay | Admitting: Family Medicine

## 2019-07-06 DIAGNOSIS — M5442 Lumbago with sciatica, left side: Secondary | ICD-10-CM | POA: Diagnosis not present

## 2019-07-06 DIAGNOSIS — M545 Low back pain: Secondary | ICD-10-CM | POA: Diagnosis not present

## 2019-07-06 DIAGNOSIS — E782 Mixed hyperlipidemia: Secondary | ICD-10-CM | POA: Diagnosis not present

## 2019-07-06 DIAGNOSIS — E119 Type 2 diabetes mellitus without complications: Secondary | ICD-10-CM

## 2019-07-06 DIAGNOSIS — G8929 Other chronic pain: Secondary | ICD-10-CM

## 2019-07-06 MED ORDER — OZEMPIC (0.25 OR 0.5 MG/DOSE) 2 MG/1.5ML ~~LOC~~ SOPN: 0.5000 mg | PEN_INJECTOR | SUBCUTANEOUS | 1 refills | Status: DC

## 2019-07-06 MED ORDER — TRAMADOL HCL 50 MG PO TABS: ORAL_TABLET | ORAL | 0 refills | Status: DC

## 2019-07-06 NOTE — Assessment & Plan Note (Signed)
Triglycerides are very elevated.  His uncontrolled diabetes may be contributing.  Will attempt to get that under better control.  We will see about requesting his most recent lipid panel from the [place].  He will continue his current regimen.

## 2019-07-06 NOTE — Progress Notes (Signed)
I called the patient and informed him to come in and sign a medical release for the [place] I informed him it will be at the front waiting for him.  Julyssa Kyer,cma

## 2019-07-06 NOTE — Assessment & Plan Note (Signed)
Uncontrolled.  He will continue with the higher dose of Ozempic.  Advised he can discontinue the Januvia as it was redundant therapy given that he is on Ozempic.  Continue Metformin.  A1c in about a month.

## 2019-07-06 NOTE — Progress Notes (Signed)
Virtual Visit via telephone Note  This visit type was conducted due to national recommendations for restrictions regarding the COVID-19 pandemic (e.g. social distancing).  This format is felt to be most appropriate for this patient at this time.  All issues noted in this document were discussed and addressed.  No physical exam was performed (except for noted visual exam findings with Video Visits).   I connected with Walter Salas today at  9:00 AM EDT by telephone and verified that I am speaking with the correct person using two identifiers. Location patient: home Location provider: work Persons participating in the virtual visit: patient, provider, Angeles Paolucci (wife)  I discussed the limitations, risks, security and privacy concerns of performing an evaluation and management service by telephone and the availability of in person appointments. I also discussed with the patient that there may be a patient responsible charge related to this service. The patient expressed understanding and agreed to proceed.  Interactive audio and video telecommunications were attempted between this provider and patient, however failed, due to patient having technical difficulties OR patient did not have access to video capability.  We continued and completed visit with audio only.    Reason for visit: follow-up  HPI: Diabetes: Typically running 150-200.  He is having less frequent 200 level blood sugars.  He has continued on Januvia, Metformin, and Ozempic.  He notes 2 weeks ago he increased his Ozempic dose to 0.5 mg once weekly.  He does have chronic polyuria with nocturia.  No hypoglycemia.  He reports his most recent A1c 2 months ago was 8.4 at the [place].  Hyperlipidemia: He had his labs checked at the [place] and notes his triglycerides were quite elevated.  He has been on fenofibrate, Lipitor, and Vascepa.  Chronic back pain: He has been following with orthopedics.  He had a laminectomy which did not really  help.  They have done multiple injections and were able to figure out exactly where the issue was but they could not quite figure out what is causing it.  There is nerve pain and impingement.  He notes orthopedics advised him that he would need to see a pain clinic.  Tramadol does help with his pain.   ROS: See pertinent positives and negatives per HPI.  Past Medical History:  Diagnosis Date  . Agent orange exposure   . Anginal pain ([place])   . Arthritis   . CAD (coronary artery disease)    a. s/p PCI/DES to the LCx and RCA in 2014  . Cancer ([place])    skin  . CHF (congestive heart failure) ([place])   . Chronic cough   . Chronic pain   . Colon polyp   . Depression   . Diabetes mellitus without complication ([place])   . Diverticulosis   . History of kidney stones   . History of stomach ulcers   . HOH (hard of hearing)   . Hx of laminectomy 1999  . Hyperlipidemia   . Hypertension   . Kidney stone   . Myocardial infarction ([place])   . Sleep apnea   . Syncope    a. Event monitor 2018: NSR with first-degree AV block, no significant arrhythmias or pauses; b.  Myoview showed a small in size, mild in severity fixed basal and anterior lateral defect, no ischemia, LVEF 55 to 65%, low risk study  . Thoracic aortic aneurysm ([place])    a. TTE 2018: EF 60-65%, normal wall motion, Gr1DD, mild aortic valve calcification, mildly dilated  aortic root measuring up to 4.2 cm, normal RV cavity size and systolic function, normal PASP; b. CTA aorta 6/18:showed an ascending thoracic aortic aneurysmal dilatation with a maximum diameter of 4.3 cm  . Ulcer     Past Surgical History:  Procedure Laterality Date  . [place]  . CARPAL TUNNEL RELEASE Bilateral 1990  . CATARACT EXTRACTION W/PHACO Left 03/08/2018   Procedure: CATARACT EXTRACTION PHACO AND INTRAOCULAR LENS PLACEMENT ([place]) LEFT;  Surgeon: Birder Robson, MD;  Location: [facility] ORS;  Service: Ophthalmology;  Laterality: Left;  [place]   00:24.2 CDE 3.25 Fluid pack lot # [id number]  . CATARACT EXTRACTION W/PHACO Right 05/24/2018   Procedure: CATARACT EXTRACTION PHACO AND INTRAOCULAR LENS PLACEMENT ([facility]) RIGHT;  Surgeon: Birder Robson, MD;  Location: [place];  Service: Ophthalmology;  Laterality: Right;  Diabetic - oral meds  . CERVICAL FUSION  2000. 2003  . CHOLECYSTECTOMY  2000  . COLONOSCOPY WITH PROPOFOL N/A 02/23/2017   Procedure: COLONOSCOPY WITH PROPOFOL;  Surgeon: Lollie Sails, MD;  Location: [facility] ENDOSCOPY;  Service: Endoscopy;  Laterality: N/A;  . CORONARY ANGIOPLASTY WITH STENT PLACEMENT  2014   [facility] [employer]  . ESOPHAGOGASTRODUODENOSCOPY (EGD) WITH PROPOFOL N/A 02/23/2017   Procedure: ESOPHAGOGASTRODUODENOSCOPY (EGD) WITH PROPOFOL;  Surgeon: Lollie Sails, MD;  Location[facility] ENDOSCOPY;  Service: Endoscopy;  Laterality: N/A;  . SHOULDER ARTHROSCOPY Right 2012  . TUMOR EXCISION  2007    Family History  Problem Relation Age of Onset  . Diabetes Mother   . Heart disease Father   . Diabetes Father   . Diabetes Sister   . Heart disease Sister   . Diabetes Brother   . Heart disease Brother   . Heart disease Paternal Grandfather     SOCIAL HX: Smoker   Current Outpatient Medications:  .  amLODipine (NORVASC) 2.5 MG tablet, TAKE ONE TABLET BY MOUTH EVERY DAY, Disp: 90 tablet, Rfl: 0 .  aspirin 81 MG tablet, Take 81 mg by mouth daily., Disp: , Rfl:  .  atorvastatin (LIPITOR) 80 MG tablet, Take 80 mg by mouth at bedtime., Disp: , Rfl:  .  chlorthalidone (HYGROTON) 50 MG tablet, Take 25 mg by mouth daily., Disp: , Rfl:  .  cholecalciferol (VITAMIN D) 1000 UNITS tablet, Take 1 tablet (1,000 Units total) by mouth QID. (Patient taking differently: 4000 IU daily), Disp: 90 tablet, Rfl: 0 .  cyclobenzaprine (FLEXERIL) 10 MG tablet, Take 10 mg by mouth 2 (two) times daily., Disp: , Rfl:  .  DULoxetine (CYMBALTA) 20 MG capsule, Take 20 mg by mouth daily., Disp: ,  Rfl:  .  fenofibrate (TRICOR) 145 MG tablet, Take 1 tablet (145 mg total) by mouth daily., Disp: 30 tablet, Rfl: 0 .  glucose blood test strip, Check once daily, E11.9, Disp: 100 each, Rfl: 12 .  icosapent Ethyl (VASCEPA) 1 g capsule, Take 2 capsules (2 g total) by mouth 2 (two) times daily., Disp: 120 capsule, Rfl: 3 .  lidocaine (LIDODERM) 5 %, Place 1 patch onto the skin daily. Remove & Discard patch within 12 hours or as directed by MD (Patient taking differently: Place 1 patch onto the skin daily as needed (pain). Remove & Discard patch within 12 hours or as directed by MD), Disp: 30 patch, Rfl: 0 .  lidocaine (LIDODERM) 5 %, 1 patch daily as needed-Remove & Discard patch within 12 hours or as directed by MD, Disp: , Rfl:  .  losartan (COZAAR) 25 MG tablet, Take 25 mg by mouth daily., Disp: , Rfl:  .  metFORMIN (GLUCOPHAGE) 1000 MG tablet, Take 1,000 mg by mouth 2 (two) times daily with a meal., Disp: , Rfl:  .  methocarbamol (ROBAXIN) 500 MG tablet, Take 500 mg by mouth 2 (two) times daily as needed for muscle spasms. , Disp: , Rfl:  .  methyl salicylate liquid, Apply 1 application topically as needed for muscle pain., Disp: , Rfl:  .  methylPREDNISolone (MEDROL DOSEPAK) 4 MG TBPK tablet, Take according to pack instructions, Disp: 21 tablet, Rfl: 0 .  nitroGLYCERIN (NITROSTAT) 0.4 MG SL tablet, Place 1 tablet (0.4 mg total) under the tongue every 5 (five) minutes as needed for chest pain. May take up to 3 doses., Disp: 30 tablet, Rfl: 0 .  omeprazole (PRILOSEC) 20 MG capsule, Take 20 mg by mouth daily., Disp: , Rfl:  .  polyvinyl alcohol (LIQUIFILM TEARS) 1.4 % ophthalmic solution, Place 1 drop into both eyes as needed for dry eyes. , Disp: , Rfl:  .  potassium chloride SA (K-DUR,KLOR-CON) 20 MEQ tablet, Take 1 tablet (20 mEq total) by mouth daily., Disp: 90 tablet, Rfl: 3 .  pregabalin (LYRICA) 50 MG capsule, Take 1 capsule (50 mg total) by mouth 3 (three) times daily., Disp: 30 capsule, Rfl:  0 .  traMADol (ULTRAM) 50 MG tablet, TAKE TWO TABLETS EVERY 8 HOURS AS NEEDEDFOR MODERATE PAIN, Disp: 90 tablet, Rfl: 0 .  traZODone (DESYREL) 100 MG tablet, Take 100 mg by mouth at bedtime as needed for sleep., Disp: , Rfl:  .  vitamin B-12 (CYANOCOBALAMIN) 1000 MCG tablet, Take 1 tablet (1,000 mcg total) by mouth daily., Disp: 30 tablet, Rfl: 0 .  VITAMIN D, CHOLECALCIFEROL, PO, 4000 IU daily, Disp: , Rfl:  .  Semaglutide,0.25 or 0.5MG /DOS, (OZEMPIC, 0.25 OR 0.5 MG/DOSE,) 2 MG/1.5ML SOPN, Inject 0.375 mLs (0.5 mg total) into the skin once a week., Disp: 6 pen, Rfl: 1  EXAM: This was a telephone visit and thus no physical exam was completed.  ASSESSMENT AND PLAN:  Discussed the following assessment and plan:  Chronic back pain Chronic issue.  Continues to have pain.  We will refill his tramadol.  Controlled substance database reviewed.  I encouraged him to see the pain specialist as scheduled.  Diabetes mellitus, type 2 (HCC) Uncontrolled.  He will continue with the higher dose of Ozempic.  Advised he can discontinue the Januvia as it was redundant therapy given that he is on Ozempic.  Continue Metformin.  A1c in about a month.  Mixed hyperlipidemia Triglycerides are very elevated.  His uncontrolled diabetes may be contributing.  Will attempt to get that under better control.  We will see about requesting his most recent lipid panel from the [place].  He will continue his current regimen.   No orders of the defined types were placed in this encounter.   Meds ordered this encounter  Medications  . Semaglutide,0.25 or 0.5MG /DOS, (OZEMPIC, 0.25 OR 0.5 MG/DOSE,) 2 MG/1.5ML SOPN    Sig: Inject 0.375 mLs (0.5 mg total) into the skin once a week.    Dispense:  6 pen    Refill:  1  . traMADol (ULTRAM) 50 MG tablet    Sig: TAKE TWO TABLETS EVERY 8 HOURS AS NEEDEDFOR MODERATE PAIN    Dispense:  90 tablet    Refill:  0    SEND A NEW REFILL PLEASE     I discussed the assessment and treatment  plan with the patient. The patient was provided an opportunity to ask questions and all were answered. The patient agreed with the plan and demonstrated an understanding of the instructions.   The patient was advised to call back or seek an in-person evaluation if the symptoms worsen or if the condition fails to improve as anticipated.  I provided 11 minutes of non-face-to-face time during this encounter.   Tommi Rumps, MD

## 2019-07-06 NOTE — Assessment & Plan Note (Signed)
Chronic issue.  Continues to have pain.  We will refill his tramadol.  Controlled substance database reviewed.  I encouraged him to see the pain specialist as scheduled.

## 2019-07-07 ENCOUNTER — Ambulatory Visit: Payer: Medicare Other | Admitting: Family Medicine

## 2019-07-07 ENCOUNTER — Telehealth: Payer: Self-pay | Admitting: Family Medicine

## 2019-07-07 DIAGNOSIS — E785 Hyperlipidemia, unspecified: Secondary | ICD-10-CM

## 2019-07-07 DIAGNOSIS — I10 Essential (primary) hypertension: Secondary | ICD-10-CM

## 2019-07-07 DIAGNOSIS — E119 Type 2 diabetes mellitus without complications: Secondary | ICD-10-CM

## 2019-07-07 NOTE — Telephone Encounter (Signed)
lvm for pt to return in about 1 month (around 08/05/2019) for Labs, 3 months with PCP

## 2019-07-10 ENCOUNTER — Telehealth: Payer: Self-pay | Admitting: Family Medicine

## 2019-07-10 NOTE — Telephone Encounter (Signed)
Patient was in office to make a lab appointment and a 30m follow up. Unable to schedule labs, not lab orders are in system.

## 2019-07-10 NOTE — Addendum Note (Signed)
Addended by: Fulton Mole D on: 07/10/2019 10:20 AM   Modules accepted: Orders

## 2019-07-10 NOTE — Telephone Encounter (Signed)
Faxed the medical released signed by patient to [facility].  Confirmation given.  Gae Bon, cma

## 2019-07-10 NOTE — Telephone Encounter (Signed)
Patient came in and signed forms. Forms are up front in Dr. Ellen Henri color folder.

## 2019-07-11 ENCOUNTER — Ambulatory Visit: Payer: Medicare Other

## 2019-07-11 ENCOUNTER — Telehealth: Payer: Self-pay

## 2019-07-11 NOTE — Telephone Encounter (Signed)
Failed to reach patient for scheduled awv. No answer. Call forwarded to voicemail. Please reschedule as convenient.

## 2019-07-12 DIAGNOSIS — M792 Neuralgia and neuritis, unspecified: Secondary | ICD-10-CM | POA: Diagnosis not present

## 2019-07-12 DIAGNOSIS — M5416 Radiculopathy, lumbar region: Secondary | ICD-10-CM | POA: Diagnosis not present

## 2019-07-12 DIAGNOSIS — G894 Chronic pain syndrome: Secondary | ICD-10-CM | POA: Diagnosis not present

## 2019-07-12 DIAGNOSIS — M25552 Pain in left hip: Secondary | ICD-10-CM | POA: Diagnosis not present

## 2019-07-12 DIAGNOSIS — M25551 Pain in right hip: Secondary | ICD-10-CM | POA: Diagnosis not present

## 2019-07-19 ENCOUNTER — Other Ambulatory Visit: Payer: Self-pay | Admitting: Internal Medicine

## 2019-07-20 DIAGNOSIS — M5416 Radiculopathy, lumbar region: Secondary | ICD-10-CM | POA: Diagnosis not present

## 2019-07-20 DIAGNOSIS — R202 Paresthesia of skin: Secondary | ICD-10-CM | POA: Diagnosis not present

## 2019-07-31 ENCOUNTER — Other Ambulatory Visit: Payer: Self-pay | Admitting: Family Medicine

## 2019-07-31 DIAGNOSIS — M5442 Lumbago with sciatica, left side: Secondary | ICD-10-CM

## 2019-08-07 ENCOUNTER — Other Ambulatory Visit: Payer: Self-pay

## 2019-08-07 ENCOUNTER — Other Ambulatory Visit (INDEPENDENT_AMBULATORY_CARE_PROVIDER_SITE_OTHER): Payer: Medicare Other

## 2019-08-07 DIAGNOSIS — E119 Type 2 diabetes mellitus without complications: Secondary | ICD-10-CM | POA: Diagnosis not present

## 2019-08-07 DIAGNOSIS — E785 Hyperlipidemia, unspecified: Secondary | ICD-10-CM | POA: Diagnosis not present

## 2019-08-07 DIAGNOSIS — I10 Essential (primary) hypertension: Secondary | ICD-10-CM

## 2019-08-07 LAB — LDL CHOLESTEROL, DIRECT: Direct LDL: 62 mg/dL

## 2019-08-07 LAB — HEMOGLOBIN A1C: Hgb A1c MFr Bld: 7.2 % — ABNORMAL HIGH (ref 4.6–6.5)

## 2019-08-07 LAB — COMPREHENSIVE METABOLIC PANEL
ALT: 13 U/L (ref 0–53)
AST: 13 U/L (ref 0–37)
Albumin: 4.6 g/dL (ref 3.5–5.2)
Alkaline Phosphatase: 61 U/L (ref 39–117)
BUN: 16 mg/dL (ref 6–23)
CO2: 28 mEq/L (ref 19–32)
Calcium: 9.5 mg/dL (ref 8.4–10.5)
Chloride: 96 mEq/L (ref 96–112)
Creatinine, Ser: 0.71 mg/dL (ref 0.40–1.50)
GFR: 111.09 mL/min (ref >60.00)
Glucose, Bld: 163 mg/dL — ABNORMAL HIGH (ref 70–99)
Potassium: 3.7 mEq/L (ref 3.5–5.1)
Sodium: 133 mEq/L — ABNORMAL LOW (ref 135–145)
Total Bilirubin: 0.8 mg/dL (ref 0.2–1.2)
Total Protein: 6.9 g/dL (ref 6.0–8.3)

## 2019-08-07 LAB — LIPID PANEL
Cholesterol: 159 mg/dL (ref 0–200)
HDL: 25.9 mg/dL — ABNORMAL LOW (ref >39.00)
Total CHOL/HDL Ratio: 6
Triglycerides: 525 mg/dL — ABNORMAL HIGH (ref 0.0–149.0)

## 2019-08-09 DIAGNOSIS — G894 Chronic pain syndrome: Secondary | ICD-10-CM | POA: Diagnosis not present

## 2019-08-09 DIAGNOSIS — M792 Neuralgia and neuritis, unspecified: Secondary | ICD-10-CM | POA: Diagnosis not present

## 2019-08-09 DIAGNOSIS — M5416 Radiculopathy, lumbar region: Secondary | ICD-10-CM | POA: Diagnosis not present

## 2019-08-09 DIAGNOSIS — M25552 Pain in left hip: Secondary | ICD-10-CM | POA: Diagnosis not present

## 2019-08-09 DIAGNOSIS — G619 Inflammatory polyneuropathy, unspecified: Secondary | ICD-10-CM | POA: Diagnosis not present

## 2019-08-14 ENCOUNTER — Other Ambulatory Visit: Payer: Self-pay | Admitting: Family Medicine

## 2019-08-14 MED ORDER — OZEMPIC (1 MG/DOSE) 2 MG/1.5ML ~~LOC~~ SOPN: 1.0000 mg | PEN_INJECTOR | SUBCUTANEOUS | 1 refills | Status: AC

## 2019-08-17 ENCOUNTER — Other Ambulatory Visit: Payer: Self-pay | Admitting: Family Medicine

## 2019-08-17 DIAGNOSIS — M5442 Lumbago with sciatica, left side: Secondary | ICD-10-CM

## 2019-08-31 ENCOUNTER — Telehealth: Payer: Self-pay

## 2019-08-31 NOTE — Telephone Encounter (Signed)
I received information that the medication Ozempic is covered on the list of covered drugs and does not need a PA.  ng

## 2019-09-11 ENCOUNTER — Other Ambulatory Visit: Payer: Self-pay | Admitting: Family Medicine

## 2019-09-11 DIAGNOSIS — E538 Deficiency of other specified B group vitamins: Secondary | ICD-10-CM | POA: Diagnosis not present

## 2019-09-11 DIAGNOSIS — E531 Pyridoxine deficiency: Secondary | ICD-10-CM | POA: Diagnosis not present

## 2019-09-11 DIAGNOSIS — E559 Vitamin D deficiency, unspecified: Secondary | ICD-10-CM | POA: Diagnosis not present

## 2019-09-11 DIAGNOSIS — Z7189 Other specified counseling: Secondary | ICD-10-CM | POA: Diagnosis not present

## 2019-09-11 DIAGNOSIS — E519 Thiamine deficiency, unspecified: Secondary | ICD-10-CM | POA: Diagnosis not present

## 2019-09-11 DIAGNOSIS — R202 Paresthesia of skin: Secondary | ICD-10-CM | POA: Diagnosis not present

## 2019-09-11 DIAGNOSIS — R200 Anesthesia of skin: Secondary | ICD-10-CM | POA: Diagnosis not present

## 2019-09-11 DIAGNOSIS — M5442 Lumbago with sciatica, left side: Secondary | ICD-10-CM

## 2019-09-11 DIAGNOSIS — M5416 Radiculopathy, lumbar region: Secondary | ICD-10-CM | POA: Diagnosis not present

## 2019-09-26 DIAGNOSIS — Z9049 Acquired absence of other specified parts of digestive tract: Secondary | ICD-10-CM | POA: Diagnosis not present

## 2019-09-26 DIAGNOSIS — I700 Atherosclerosis of aorta: Secondary | ICD-10-CM | POA: Diagnosis not present

## 2019-09-26 DIAGNOSIS — M5416 Radiculopathy, lumbar region: Secondary | ICD-10-CM | POA: Diagnosis not present

## 2019-09-26 DIAGNOSIS — M5116 Intervertebral disc disorders with radiculopathy, lumbar region: Secondary | ICD-10-CM | POA: Diagnosis not present

## 2019-09-28 ENCOUNTER — Other Ambulatory Visit: Payer: Self-pay | Admitting: Nurse Practitioner

## 2019-09-28 DIAGNOSIS — M5416 Radiculopathy, lumbar region: Secondary | ICD-10-CM

## 2019-10-02 ENCOUNTER — Inpatient Hospital Stay: Payer: Medicare Other

## 2019-10-02 ENCOUNTER — Other Ambulatory Visit: Payer: Self-pay

## 2019-10-02 ENCOUNTER — Encounter: Payer: Self-pay | Admitting: Oncology

## 2019-10-02 ENCOUNTER — Inpatient Hospital Stay: Payer: Medicare Other | Attending: Oncology | Admitting: Oncology

## 2019-10-02 VITALS — BP 108/71 | HR 89 | Temp 97.8°F | Resp 18 | Wt 216.7 lb

## 2019-10-02 DIAGNOSIS — F17210 Nicotine dependence, cigarettes, uncomplicated: Secondary | ICD-10-CM | POA: Insufficient documentation

## 2019-10-02 DIAGNOSIS — I2510 Atherosclerotic heart disease of native coronary artery without angina pectoris: Secondary | ICD-10-CM | POA: Insufficient documentation

## 2019-10-02 DIAGNOSIS — I252 Old myocardial infarction: Secondary | ICD-10-CM | POA: Diagnosis not present

## 2019-10-02 DIAGNOSIS — Z8249 Family history of ischemic heart disease and other diseases of the circulatory system: Secondary | ICD-10-CM | POA: Insufficient documentation

## 2019-10-02 DIAGNOSIS — Z7984 Long term (current) use of oral hypoglycemic drugs: Secondary | ICD-10-CM | POA: Insufficient documentation

## 2019-10-02 DIAGNOSIS — F329 Major depressive disorder, single episode, unspecified: Secondary | ICD-10-CM | POA: Insufficient documentation

## 2019-10-02 DIAGNOSIS — Z7952 Long term (current) use of systemic steroids: Secondary | ICD-10-CM | POA: Insufficient documentation

## 2019-10-02 DIAGNOSIS — E119 Type 2 diabetes mellitus without complications: Secondary | ICD-10-CM | POA: Diagnosis not present

## 2019-10-02 DIAGNOSIS — R778 Other specified abnormalities of plasma proteins: Secondary | ICD-10-CM

## 2019-10-02 DIAGNOSIS — G4730 Sleep apnea, unspecified: Secondary | ICD-10-CM | POA: Insufficient documentation

## 2019-10-02 DIAGNOSIS — G629 Polyneuropathy, unspecified: Secondary | ICD-10-CM

## 2019-10-02 DIAGNOSIS — Z833 Family history of diabetes mellitus: Secondary | ICD-10-CM | POA: Insufficient documentation

## 2019-10-02 DIAGNOSIS — I110 Hypertensive heart disease with heart failure: Secondary | ICD-10-CM | POA: Diagnosis not present

## 2019-10-02 DIAGNOSIS — Z7982 Long term (current) use of aspirin: Secondary | ICD-10-CM | POA: Insufficient documentation

## 2019-10-02 DIAGNOSIS — E785 Hyperlipidemia, unspecified: Secondary | ICD-10-CM | POA: Insufficient documentation

## 2019-10-02 DIAGNOSIS — I509 Heart failure, unspecified: Secondary | ICD-10-CM | POA: Diagnosis not present

## 2019-10-02 DIAGNOSIS — Z79899 Other long term (current) drug therapy: Secondary | ICD-10-CM | POA: Insufficient documentation

## 2019-10-02 LAB — CBC WITH DIFFERENTIAL/PLATELET
Abs Immature Granulocytes: 0.01 10*3/uL (ref 0.00–0.07)
Basophils Absolute: 0.1 10*3/uL (ref 0.0–0.1)
Basophils Relative: 1 %
Eosinophils Absolute: 0.2 10*3/uL (ref 0.0–0.5)
Eosinophils Relative: 3 %
HCT: 44.4 % (ref 39.0–52.0)
Hemoglobin: 15.3 g/dL (ref 13.0–17.0)
Immature Granulocytes: 0 %
Lymphocytes Relative: 19 %
Lymphs Abs: 1.2 10*3/uL (ref 0.7–4.0)
MCH: 28.3 pg (ref 26.0–34.0)
MCHC: 34.5 g/dL (ref 30.0–36.0)
MCV: 82.1 fL (ref 80.0–100.0)
Monocytes Absolute: 0.6 10*3/uL (ref 0.1–1.0)
Monocytes Relative: 9 %
Neutro Abs: 4.3 10*3/uL (ref 1.7–7.7)
Neutrophils Relative %: 68 %
Platelets: 225 10*3/uL (ref 150–400)
RBC: 5.41 MIL/uL (ref 4.22–5.81)
RDW: 14.6 % (ref 11.5–15.5)
WBC: 6.3 10*3/uL (ref 4.0–10.5)
nRBC: 0 % (ref 0.0–0.2)

## 2019-10-02 LAB — TECHNOLOGIST SMEAR REVIEW
Plt Morphology: NORMAL
RBC Morphology: NORMAL

## 2019-10-02 LAB — COMPREHENSIVE METABOLIC PANEL
ALT: 17 U/L (ref 0–44)
AST: 20 U/L (ref 15–41)
Albumin: 4.7 g/dL (ref 3.5–5.0)
Alkaline Phosphatase: 50 U/L (ref 38–126)
Anion gap: 11 (ref 5–15)
BUN: 20 mg/dL (ref 8–23)
CO2: 28 mmol/L (ref 22–32)
Calcium: 8.9 mg/dL (ref 8.9–10.3)
Chloride: 98 mmol/L (ref 98–111)
Creatinine, Ser: 0.9 mg/dL (ref 0.61–1.24)
GFR calc Af Amer: >60 mL/min (ref >60)
GFR calc non Af Amer: >60 mL/min (ref >60)
Glucose, Bld: 138 mg/dL — ABNORMAL HIGH (ref 70–99)
Potassium: 3.7 mmol/L (ref 3.5–5.1)
Sodium: 137 mmol/L (ref 135–145)
Total Bilirubin: 0.8 mg/dL (ref 0.3–1.2)
Total Protein: 7.8 g/dL (ref 6.5–8.1)

## 2019-10-02 LAB — LACTATE DEHYDROGENASE: LDH: 97 U/L — ABNORMAL LOW (ref 98–192)

## 2019-10-02 NOTE — Progress Notes (Signed)
Hematology/Oncology Consult note [facility] Telephone:([phone] [phone]   Patient Care Team: Leone Haven, MD as PCP - General (Family Medicine) End, Harrell Gave, MD as PCP - Cardiology (Cardiology) Jackolyn Confer, MD (Internal Medicine) Bary Castilla Forest Gleason, MD (General Surgery)  REFERRING PROVIDER: Vladimir Crofts, MD  CHIEF COMPLAINTS/REASON FOR VISIT:  Evaluation of abnormal SPEP  HISTORY OF PRESENTING ILLNESS:  Walter Salas is a 64 y.o. male who was seen in consultation at the request of Vladimir Crofts, MD for evaluation of abnormal SPEP results.   Patient recently had work up done at Neurologist's office for peripheral neuropathy. Labs reviewed,  09/11/2019 SPEP showed no M spike,   , and IFE showed IgG Kappa monoclonal protein.  No aggravating or alleviated factors.  Associated signs or symptoms:  Neuropathy:  Denies weight loss, fever chills, bone pain,  Patient has history of chronic neuropathy. Patient has hypertension, diabetes.  Review of Systems  Constitutional: Positive for fatigue. Negative for appetite change, chills, fever and unexpected weight change.  HENT:   Negative for hearing loss and voice change.   Eyes: Negative for eye problems and icterus.  Respiratory: Negative for chest tightness, cough and shortness of breath.   Cardiovascular: Negative for chest pain and leg swelling.  Gastrointestinal: Negative for abdominal distention and abdominal pain.  Endocrine: Negative for hot flashes.  Genitourinary: Negative for difficulty urinating, dysuria and frequency.   Musculoskeletal: Negative for arthralgias.  Skin: Negative for itching and rash.  Neurological: Negative for light-headedness and numbness.  Hematological: Negative for adenopathy. Does not bruise/bleed easily.  Psychiatric/Behavioral: Negative for confusion.     MEDICAL HISTORY:  Past Medical History:  Diagnosis Date  . Agent orange exposure    . Anginal pain ([place])   . Arthritis   . CAD (coronary artery disease)    a. s/p PCI/DES to the LCx and RCA in 2014  . Cancer ([place])    skin  . CHF (congestive heart failure) ([place])   . Chronic cough   . Chronic pain   . Colon polyp   . Depression   . Diabetes mellitus without complication ([place])   . Diverticulosis   . History of kidney stones   . History of stomach ulcers   . HOH (hard of hearing)   . Hx of laminectomy 1999  . Hyperlipidemia   . Hypertension   . Kidney stone   . Myocardial infarction ([place])   . Sleep apnea   . Syncope    a. Event monitor 2018: NSR with first-degree AV block, no significant arrhythmias or pauses; b.  Myoview showed a small in size, mild in severity fixed basal and anterior lateral defect, no ischemia, LVEF 55 to 65%, low risk study  . Thoracic aortic aneurysm ([place])    a. TTE 2018: EF 60-65%, normal wall motion, Gr1DD, mild aortic valve calcification, mildly dilated aortic root measuring up to 4.2 cm, normal RV cavity size and systolic function, normal PASP; b. CTA aorta 6/18:showed an ascending thoracic aortic aneurysmal dilatation with a maximum diameter of 4.3 cm  . Ulcer     SURGICAL HISTORY: Past Surgical History:  Procedure Laterality Date  . [place]  . CARPAL TUNNEL RELEASE Bilateral 1990  . CATARACT EXTRACTION W/PHACO Left 03/08/2018   Procedure: CATARACT EXTRACTION PHACO AND INTRAOCULAR LENS PLACEMENT ([place]) LEFT;  Surgeon: Birder Robson, MD;  Location: [facility] ORS;  Service: Ophthalmology;  Laterality: Left;  [place]  00:24.2 CDE  3.25 Fluid pack lot # [id number]  . CATARACT EXTRACTION W/PHACO Right 05/24/2018   Procedure: CATARACT EXTRACTION PHACO AND INTRAOCULAR LENS PLACEMENT ([facility]) RIGHT;  Surgeon: Birder Robson, MD;  Location: [facility];  Service: Ophthalmology;  Laterality: Right;  Diabetic - oral meds  . CERVICAL FUSION  2000. 2003  . CHOLECYSTECTOMY  2000  . COLONOSCOPY WITH PROPOFOL N/A 02/23/2017    Procedure: COLONOSCOPY WITH PROPOFOL;  Surgeon: Lollie Sails, MD;  Location: [facility] END[facility]COPY;  Service: Endoscopy;  Laterality: N/A;  . CORONARY ANGIOPLASTY WITH STENT PLACEMENT  [facility]   [facility] [employer]  . ESOPHAGOGASTRODUODENOSCOPY (EGD) WITH PROPOFOL N/A 02/23/2017   Procedure: ESOPHAGOGASTRODUODENOSCOPY (EGD) WITH PROPOFOL;  Surgeon: Lollie Sails, MD;  Location: [facility] ENDOSCOPY;  Service: Endoscopy;  Laterality: N/A;  . SHOULDER ARTHROSCOPY Right 2012  . TUMOR EXCISION  2007    SOCIAL HISTORY: Social History   Socioeconomic History  . Marital status: Married    Spouse name: Not on file  . Number of children: Not on file  . Years of education: Not on file  . Highest education level: Not on file  Occupational History  . Not on file  Tobacco Use  . Smoking status: Current Every Day Smoker    Packs/day: 1.00    Years: 50.00    Pack years: 50.00    Types: Cigarettes    Last attempt to quit: 02/15/2018    Years since quitting: 1.6  . Smokeless tobacco: Never Used  Vaping Use  . Vaping Use: Never used  Substance and Sexual Activity  . Alcohol use: No  . Drug use: No  . Sexual activity: Never  Other Topics Concern  . Not on file  Social History Narrative   Lives in [place] with wife. Has cat.   Work - Database administrator, retired      Followed at [employer].      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      Diet - Regular   Exercise - starting back at gym   Social Determinants of Health   Financial Resource Strain:   . Difficulty of Paying Living Expenses: Not on file  Food Insecurity:   . Worried About Charity fundraiser in the Last Year: Not on file  . Ran Out of Food in the Last Year: Not on file  Transportation Needs:   . Lack of Transportation (Medical): Not on file  . Lack of Transportation (Non-Medical): Not on file  Physical Activity:   . Days of Exercise per Week: Not on file  . Minutes of Exercise  per Session: Not on file  Stress:   . Feeling of Stress : Not on file  Social Connections:   . Frequency of Communication with Friends and Family: Not on file  . Frequency of Social Gatherings with Friends and Family: Not on file  . Attends Religious Services: Not on file  . Active Member of Clubs or Organizations: Not on file  . Attends Archivist Meetings: Not on file  . Marital Status: Not on file  Intimate Partner Violence:   . Fear of Current or Ex-Partner: Not on file  . Emotionally Abused: Not on file  . Physically Abused: Not on file  . Sexually Abused: Not on file    FAMILY HISTORY: Family History  Problem Relation Age of Onset  . Diabetes Mother   . Heart disease Father   . Diabetes Father   .  Diabetes Sister   . Heart disease Sister   . Diabetes Brother   . Heart disease Brother   . Heart disease Paternal Grandfather   . Cancer Neg Hx     ALLERGIES:  is allergic to gabapentin, isosorbide mononitrate er [isosorbide dinitrate], and tylenol [acetaminophen].  MEDICATIONS:  Current Outpatient Medications  Medication Sig Dispense Refill  . amLODipine (NORVASC) 2.5 MG tablet TAKE ONE TABLET BY MOUTH EVERY DAY 90 tablet 1  . aspirin 81 MG tablet Take 81 mg by mouth daily.    Marland Kitchen atorvastatin (LIPITOR) 80 MG tablet Take 80 mg by mouth at bedtime.    . chlorthalidone (HYGROTON) 50 MG tablet Take 25 mg by mouth daily.    . cholecalciferol (VITAMIN D) 1000 UNITS tablet Take 1 tablet (1,000 Units total) by mouth QID. (Patient taking differently: 4000 IU daily) 90 tablet 0  . DULoxetine (CYMBALTA) 20 MG capsule Take 20 mg by mouth daily.    . fenofibrate (TRICOR) 145 MG tablet Take 1 tablet (145 mg total) by mouth daily. 30 tablet 0  . glucose blood test strip Check once daily, E11.9 100 each 12  . lidocaine (LIDODERM) 5 % Place 1 patch onto the skin daily. Remove & Discard patch within 12 hours or as directed by MD (Patient taking differently: Place 1 patch onto the  skin daily as needed (pain). Remove & Discard patch within 12 hours or as directed by MD) 30 patch 0  . lidocaine (LIDODERM) 5 % 1 patch daily as needed-Remove & Discard patch within 12 hours or as directed by MD    . losartan (COZAAR) 25 MG tablet Take 25 mg by mouth daily.    . metFORMIN (GLUCOPHAGE) 1000 MG tablet Take 1,000 mg by mouth 2 (two) times daily with a meal.    . methocarbamol (ROBAXIN) 500 MG tablet Take 500 mg by mouth 2 (two) times daily as needed for muscle spasms.     . methyl salicylate liquid Apply 1 application topically as needed for muscle pain.    . nitroGLYCERIN (NITROSTAT) 0.4 MG SL tablet Place 1 tablet (0.4 mg total) under the tongue every 5 (five) minutes as needed for chest pain. May take up to 3 doses. 30 tablet 0  . omeprazole (PRILOSEC) 20 MG capsule Take 20 mg by mouth daily.    . polyvinyl alcohol (LIQUIFILM TEARS) 1.4 % ophthalmic solution Place 1 drop into both eyes as needed for dry eyes.     . potassium chloride SA (K-DUR,KLOR-CON) 20 MEQ tablet Take 1 tablet (20 mEq total) by mouth daily. 90 tablet 3  . pregabalin (LYRICA) 50 MG capsule Take 1 capsule (50 mg total) by mouth 3 (three) times daily. 30 capsule 0  . Semaglutide, 1 MG/DOSE, (OZEMPIC, 1 MG/DOSE,) 2 MG/1.5ML SOPN Inject 0.75 mLs (1 mg total) into the skin once a week. 9 mL 1  . traMADol (ULTRAM) 50 MG tablet TAKE TWO TABLETS EVERY EIGHT HOURS AS NEEDED FOR MODERATE PAIN 90 tablet 0  . VASCEPA 1 g capsule TAKE 2 CAPSULES BY MOUTH TWICE DAILY 120 capsule 3  . vitamin B-12 (CYANOCOBALAMIN) 1000 MCG tablet Take 1 tablet (1,000 mcg total) by mouth daily. 30 tablet 0  . cyclobenzaprine (FLEXERIL) 10 MG tablet Take 10 mg by mouth 2 (two) times daily. (Patient not taking: Reported on 10/02/2019)    . methylPREDNISolone (MEDROL DOSEPAK) 4 MG TBPK tablet Take according to pack instructions (Patient not taking: Reported on 10/02/2019) 21 tablet 0  . traZODone (  DESYREL) 100 MG tablet Take 100 mg by mouth at  bedtime as needed for sleep. (Patient not taking: Reported on 10/02/2019)     No current facility-administered medications for this visit.     PHYSICAL EXAMINATION: ECOG PERFORMANCE STATUS: 0 - Asymptomatic Vitals:   10/02/19 1119  BP: 108/71  Pulse: 89  Resp: 18  Temp: 97.8 F (36.6 C)   Filed Weights   10/02/19 1119  Weight: 216 lb 11.2 oz (98.3 kg)    Physical Exam Constitutional:      General: He is not in acute distress. HENT:     Head: Normocephalic and atraumatic.  Eyes:     General: No scleral icterus. Cardiovascular:     Rate and Rhythm: Normal rate and regular rhythm.     Heart sounds: Normal heart sounds.  Pulmonary:     Effort: Pulmonary effort is normal. No respiratory distress.     Breath sounds: No wheezing.  Abdominal:     General: Bowel sounds are normal. There is no distension.     Palpations: Abdomen is soft.  Musculoskeletal:        General: No deformity. Normal range of motion.     Cervical back: Normal range of motion and neck supple.  Skin:    General: Skin is warm and dry.     Findings: No erythema or rash.  Neurological:     Mental Status: He is alert and oriented to person, place, and time. Mental status is at baseline.     Cranial Nerves: No cranial nerve deficit.     Coordination: Coordination normal.  Psychiatric:        Mood and Affect: Mood normal.      LABORATORY DATA:  I have reviewed the data as listed Lab Results  Component Value Date   WBC 6.3 10/02/2019   HGB 15.3 10/02/2019   HCT 44.4 10/02/2019   MCV 82.1 10/02/2019   PLT 225 10/02/2019   Recent Labs    11/02/18 0928 12/22/18 0911 03/10/19 1317 08/07/19 0831 10/02/19 1207  NA   < > 137 136 133* 137  K   < > 3.9 3.7 3.7 3.7  CL   < > 100 96 96 98  CO2   < > 24 32 28 28  GLUCOSE   < > 236* 153* 163* 138*  BUN   < > 9 15 16 20   CREATININE   < > 0.65* 0.81 0.71 0.90  CALCIUM   < > 9.3 9.8 9.5 8.9  GFRNONAA  --  102  --   --  >60  GFRAA  --  118  --   --   >60  PROT   < >  --  7.5 6.9 7.8  ALBUMIN   < >  --  4.4 4.6 4.7  AST   < >  --  15 13 20   ALT   < >  --  19 13 17   ALKPHOS   < >  --  64 61 50  BILITOT   < >  --  0.4 0.8 0.8   < > = values in this interval not displayed.   Iron/TIBC/Ferritin/ %Sat No results found for: IRON, TIBC, FERRITIN, IRONPCTSAT    RADIOGRAPHIC STUDIES: I have personally reviewed the radiological images as listed and agreed with the findings in the report.  No results found.   ASSESSMENT & PLAN:  1. Abnormal SPEP   2. Neuropathy    Labs are reviewed and discussed  with patient.  Most likely IgG MGUS. I recommend checking multiple myeloma panel [SPEP, IFE, light chain ratio], UPEP, UFE.  Check CBC, CMP, LDH. Peripheral neuropathy, follow-up with neurology.  Likely secondary to diabetes, radiculopathy from multiple spine surgeries. Orders Placed This Encounter  Procedures  . Technologist smear review    Standing Status:   Future    Number of Occurrences:   1    Standing Expiration Date:   10/01/2020  . Comprehensive metabolic panel    Standing Status:   Future    Number of Occurrences:   1    Standing Expiration Date:   10/01/2020  . CBC with Differential/Platelet    Standing Status:   Future    Number of Occurrences:   1    Standing Expiration Date:   10/01/2020  . Multiple Myeloma Panel (SPEP&IFE w/QIG)    Standing Status:   Future    Number of Occurrences:   1    Standing Expiration Date:   10/01/2020  . Kappa/lambda light chains    Standing Status:   Future    Number of Occurrences:   1    Standing Expiration Date:   10/01/2020  . Flow cytometry panel-leukemia/lymphoma work-up    Standing Status:   Future    Number of Occurrences:   1    Standing Expiration Date:   10/01/2020  . Lactate dehydrogenase    Standing Status:   Future    Number of Occurrences:   1    Standing Expiration Date:   10/01/2020  . Protein Electro, Random Urine    Standing Status:   Future    Number of Occurrences:   1     Standing Expiration Date:   10/01/2020    All questions were answered. The patient knows to call the clinic with any problems questions or concerns.  Cc Vladimir Crofts, MD  Return of visit: To be determined.  If MGUS is confirmed, will recommend patient to follow-up in 6 months. Thank you for this kind referral and the opportunity to participate in the care of this patient. A copy of today's note is routed to referring provider  Total face to face encounter time for this patient visit was 45 min. >50% of the time was  spent in counseling and coordination of care.    Earlie Server, MD, PhD 10/02/2019

## 2019-10-02 NOTE — Progress Notes (Signed)
New patient evaluation.   

## 2019-10-03 LAB — PROTEIN ELECTRO, RANDOM URINE
Albumin ELP, Urine: 54.9 %
Alpha-1-Globulin, U: 4.9 %
Alpha-2-Globulin, U: 7.8 %
Beta Globulin, U: 18.4 %
Gamma Globulin, U: 14 %
Total Protein, Urine: 17 mg/dL

## 2019-10-03 LAB — KAPPA/LAMBDA LIGHT CHAINS
Kappa free light chain: 26.7 mg/L — ABNORMAL HIGH (ref 3.3–19.4)
Kappa, lambda light chain ratio: 1.98 — ABNORMAL HIGH (ref 0.26–1.65)
Lambda free light chains: 13.5 mg/L (ref 5.7–26.3)

## 2019-10-04 LAB — MULTIPLE MYELOMA PANEL, SERUM
Albumin SerPl Elph-Mcnc: 4.3 g/dL (ref 2.9–4.4)
Albumin/Glob SerPl: 1.5 (ref 0.7–1.7)
Alpha 1: 0.3 g/dL (ref 0.0–0.4)
Alpha2 Glob SerPl Elph-Mcnc: 0.8 g/dL (ref 0.4–1.0)
B-Globulin SerPl Elph-Mcnc: 1.1 g/dL (ref 0.7–1.3)
Gamma Glob SerPl Elph-Mcnc: 0.8 g/dL (ref 0.4–1.8)
Globulin, Total: 3 g/dL (ref 2.2–3.9)
IgA: 92 mg/dL (ref 61–437)
IgG (Immunoglobin G), Serum: 881 mg/dL (ref 603–1613)
IgM (Immunoglobulin M), Srm: 40 mg/dL (ref 20–172)
Total Protein ELP: 7.3 g/dL (ref 6.0–8.5)

## 2019-10-04 LAB — COMP PANEL: LEUKEMIA/LYMPHOMA

## 2019-10-05 ENCOUNTER — Telehealth: Payer: Self-pay

## 2019-10-05 NOTE — Telephone Encounter (Signed)
Done 6 mth lab  and MD f/u 1 week after appts has  been scheduled as requested. I was unable to reach pt by phone to make him aware of his sched appts A detailed message was left on pt VM and a reminder letter will be mailed out making him aware

## 2019-10-05 NOTE — Telephone Encounter (Signed)
Pt informed. Please contact pt to schedule appts as requested by MD. Thanks

## 2019-10-05 NOTE — Telephone Encounter (Signed)
-----   Message from Earlie Server, MD sent at 10/04/2019 10:22 PM EDT ----- Please let her know that her blood work up results are consistent with diagnosis of MGUS, which is a pre-cancer condition. No need for treatment. I recommend her to followup in 6 months.  Labs first, ordered. MD virtually or in person 1 week after.

## 2019-10-09 ENCOUNTER — Other Ambulatory Visit: Payer: Self-pay | Admitting: Family Medicine

## 2019-10-09 DIAGNOSIS — M5442 Lumbago with sciatica, left side: Secondary | ICD-10-CM

## 2019-10-10 ENCOUNTER — Encounter: Payer: Self-pay | Admitting: Family Medicine

## 2019-10-10 ENCOUNTER — Telehealth: Payer: Self-pay | Admitting: Family Medicine

## 2019-10-10 ENCOUNTER — Ambulatory Visit (INDEPENDENT_AMBULATORY_CARE_PROVIDER_SITE_OTHER): Payer: Medicare Other | Admitting: Family Medicine

## 2019-10-10 ENCOUNTER — Other Ambulatory Visit: Payer: Self-pay

## 2019-10-10 VITALS — BP 120/70 | HR 86 | Temp 98.1°F | Ht 70.0 in | Wt 215.6 lb

## 2019-10-10 DIAGNOSIS — Z23 Encounter for immunization: Secondary | ICD-10-CM | POA: Diagnosis not present

## 2019-10-10 DIAGNOSIS — F17210 Nicotine dependence, cigarettes, uncomplicated: Secondary | ICD-10-CM

## 2019-10-10 DIAGNOSIS — I10 Essential (primary) hypertension: Secondary | ICD-10-CM

## 2019-10-10 DIAGNOSIS — M545 Low back pain: Secondary | ICD-10-CM

## 2019-10-10 DIAGNOSIS — G8929 Other chronic pain: Secondary | ICD-10-CM

## 2019-10-10 DIAGNOSIS — E119 Type 2 diabetes mellitus without complications: Secondary | ICD-10-CM | POA: Diagnosis not present

## 2019-10-10 DIAGNOSIS — R350 Frequency of micturition: Secondary | ICD-10-CM | POA: Diagnosis not present

## 2019-10-10 DIAGNOSIS — Z125 Encounter for screening for malignant neoplasm of prostate: Secondary | ICD-10-CM | POA: Diagnosis not present

## 2019-10-10 DIAGNOSIS — M5442 Lumbago with sciatica, left side: Secondary | ICD-10-CM

## 2019-10-10 HISTORY — DX: Nicotine dependence, cigarettes, uncomplicated: F17.210

## 2019-10-10 LAB — POCT URINALYSIS DIPSTICK
Bilirubin, UA: NEGATIVE
Blood, UA: NEGATIVE
Glucose, UA: POSITIVE — AB
Ketones, UA: NEGATIVE
Leukocytes, UA: NEGATIVE
Nitrite, UA: NEGATIVE
Protein, UA: NEGATIVE
Spec Grav, UA: 1.025 (ref 1.010–1.025)
Urobilinogen, UA: 0.2 E.U./dL
pH, UA: 7 (ref 5.0–8.0)

## 2019-10-10 LAB — PSA, MEDICARE: PSA: 0.19 ng/ml (ref 0.10–4.00)

## 2019-10-10 MED ORDER — TRAMADOL HCL 50 MG PO TABS: ORAL_TABLET | ORAL | 0 refills | Status: DC

## 2019-10-10 NOTE — Addendum Note (Signed)
Addended by: Leone Haven on: 10/10/2019 02:58 PM   Modules accepted: Orders

## 2019-10-10 NOTE — Assessment & Plan Note (Signed)
Smoking cessation counseling was provided.  Approximately 4 minutes were spent discussing the rationale for tobacco cessation and strategies for doing so.  Adjuncts, including nicotine patches, nicotine lozenges, varenicline and buproprion were recommended. F/u 6 months.

## 2019-10-10 NOTE — Assessment & Plan Note (Signed)
Seems to be well controlled based on home CBGs.  Check A1c.  Continue Metformin 1000 mg twice daily and Ozempic 1 mg once weekly.

## 2019-10-10 NOTE — Assessment & Plan Note (Signed)
Well-controlled.  Recent CMP reviewed.  Continue amlodipine 2.5 mg once daily, losartan 25 mg once daily, and chlorthalidone 50 mg once daily.

## 2019-10-10 NOTE — Patient Instructions (Signed)
Nice to see you. We will get lab work today and contact you with the results. 

## 2019-10-10 NOTE — Assessment & Plan Note (Signed)
Possibly related to his prostate though could represent a UTI.  He had a benign prostate exam today.  We will check a urinalysis.  We will check a PSA as well.  Determine treatment based off of urine results.

## 2019-10-10 NOTE — Addendum Note (Signed)
Addended by: Leeanne Rio on: 10/10/2019 03:11 PM   Modules accepted: Orders

## 2019-10-10 NOTE — Telephone Encounter (Signed)
LVM to set up 16m follow up with PCP

## 2019-10-10 NOTE — Assessment & Plan Note (Signed)
He will keep his scheduled appointment with neurosurgery next week.  Patient notes he needs a refill of his tramadol.  Controlled substance database reviewed.

## 2019-10-10 NOTE — Progress Notes (Signed)
Walter Rumps, MD Phone: [phone]  Walter Salas is a 64 y.o. male who presents today for f/u.  Prostate issue: Strain- no Flow- slow Frequency- increased Urgency- increased Nocturia- 6-7x Dysuria- yes Emptying bladder- yes Medication- none  HYPERTENSION  Disease Monitoring  Home BP Monitoring similar to today Chest pain- no    Dyspnea- no Medications  Compliance-  Taking amlodipine, chlorthalidone, losartan.  Edema- no  DIABETES Disease Monitoring: Blood Sugar ranges-119-140 Polyuria/phagia/dipsia- no      Optho- UTD Medications: Compliance- taking ozempic, metformin Hypoglycemic symptoms- no  Tobacco abuse: Patient notes he is never going to quit smoking.  He is aware of the risks of continued smoking.    Social History   Tobacco Use  Smoking Status Current Every Day Smoker  . Packs/day: 1.00  . Years: 50.00  . Pack years: 50.00  . Types: Cigarettes  . Last attempt to quit: 02/15/2018  . Years since quitting: 1.6  Smokeless Tobacco Never Used     ROS see history of present illness  Objective  Physical Exam Vitals:   10/10/19 1022  BP: 120/70  Pulse: 86  Temp: 98.1 F (36.7 C)  SpO2: 97%    BP Readings from Last 3 Encounters:  10/10/19 120/70  10/02/19 108/71  06/28/19 140/60   Wt Readings from Last 3 Encounters:  10/10/19 215 lb 9.6 oz (97.8 kg)  10/02/19 216 lb 11.2 oz (98.3 kg)  07/06/19 225 lb (102.1 kg)    Physical Exam Constitutional:      General: He is not in acute distress.    Appearance: He is not diaphoretic.  Cardiovascular:     Rate and Rhythm: Normal rate and regular rhythm.     Heart sounds: Normal heart sounds.  Pulmonary:     Effort: Pulmonary effort is normal.     Breath sounds: Normal breath sounds.  Genitourinary:    Prostate: Normal.     Rectum: Normal.     Comments: Prostate is without enlargement or nodules, there is no tenderness or bogginess of the prostate, no inguinal hernias bilaterally, no  suprapubic tenderness Skin:    General: Skin is warm and dry.  Neurological:     Mental Status: He is alert.    Diabetic Foot Exam - Simple   Simple Foot Form Diabetic Foot exam was performed with the following findings: Yes 10/10/2019 11:00 AM  Visual Inspection See comments: Yes Sensation Testing See comments: Yes Pulse Check Posterior Tibialis and Dorsalis pulse intact bilaterally: Yes Comments Onychomycosis bilaterally, no other deformities, ulcerations, or skin breakdown, inability to feel monofilament testing in his left foot related to a chronic back issue, otherwise monofilament testing and light touch sensation intact in his feet      Assessment/Plan: Please see individual problem list.  Urinary frequency Possibly related to his prostate though could represent a UTI.  He had a benign prostate exam today.  We will check a urinalysis.  We will check a PSA as well.  Determine treatment based off of urine results.  Chronic back pain He will keep his scheduled appointment with neurosurgery next week.  Patient notes he needs a refill of his tramadol.  Controlled substance database reviewed.  Diabetes mellitus, type 2 ([place]) Seems to be well controlled based on home CBGs.  Check A1c.  Continue Metformin 1000 mg twice daily and Ozempic 1 mg once weekly.  Essential hypertension Well-controlled.  Recent CMP reviewed.  Continue amlodipine 2.5 mg once daily, losartan 25 mg once daily, and chlorthalidone  50 mg once daily.  Nicotine dependence, cigarettes, uncomplicated Smoking cessation counseling was provided.  Approximately 4 minutes were spent discussing the rationale for tobacco cessation and strategies for doing so.  Adjuncts, including nicotine patches, nicotine lozenges, varenicline and buproprion were recommended. F/u 6 months.     Health Maintenance: Prevnar and flu vaccine given today.  Patient declines Covid vaccine.  Orders Placed This Encounter  Procedures  . Flu  Vaccine QUAD High Dose(Fluad)  . Pneumococcal conjugate vaccine 13-valent IM  . PSA, Medicare ( [facility] [facility] only)    Meds ordered this encounter  Medications  . traMADol (ULTRAM) 50 MG tablet    Sig: TAKE TWO TABLETS EVERY EIGHT HOURS AS NEEDED FOR MODERATE PAIN    Dispense:  90 tablet    Refill:  0    [place] was seen today for follow-up.  Diagnoses and all orders for this visit:  Prostate cancer screening -     PSA, Medicare ( [facility] only)  Urinary frequency  Chronic low back pain, unspecified back pain laterality, unspecified whether sciatica present  Type 2 diabetes mellitus without complication, without long-term current use of insulin (HCC)  Essential hypertension  Acute left-sided low back pain with left-sided sciatica -     traMADol (ULTRAM) 50 MG tablet; TAKE TWO TABLETS EVERY EIGHT HOURS AS NEEDED FOR MODERATE PAIN  Nicotine dependence, cigarettes, uncomplicated  Need for immunization against influenza -     Flu Vaccine QUAD High Dose(Fluad)  Need for pneumococcal vaccination -     Pneumococcal conjugate vaccine 13-valent IM     This visit occurred during the SARS-CoV-2 public health emergency.  Safety protocols were in place, including screening questions prior to the visit, additional usage of staff PPE, and extensive cleaning of exam room while observing appropriate contact time as indicated for disinfecting solutions.    Walter Rumps, MD [place]

## 2019-10-14 ENCOUNTER — Other Ambulatory Visit: Payer: Self-pay

## 2019-10-14 ENCOUNTER — Ambulatory Visit
Admission: RE | Admit: 2019-10-14 | Discharge: 2019-10-14 | Disposition: A | Discharge status: Home / Self Care | Payer: Medicare Other | Source: Ambulatory Visit | Attending: Nurse Practitioner | Admitting: Nurse Practitioner

## 2019-10-14 DIAGNOSIS — M5416 Radiculopathy, lumbar region: Secondary | ICD-10-CM

## 2019-10-14 DIAGNOSIS — D1809 Hemangioma of other sites: Secondary | ICD-10-CM | POA: Diagnosis not present

## 2019-10-14 DIAGNOSIS — M4804 Spinal stenosis, thoracic region: Secondary | ICD-10-CM | POA: Diagnosis not present

## 2019-10-14 DIAGNOSIS — M5124 Other intervertebral disc displacement, thoracic region: Secondary | ICD-10-CM | POA: Diagnosis not present

## 2019-10-14 DIAGNOSIS — M40204 Unspecified kyphosis, thoracic region: Secondary | ICD-10-CM | POA: Diagnosis not present

## 2019-10-16 ENCOUNTER — Other Ambulatory Visit: Payer: Self-pay | Admitting: Family Medicine

## 2019-10-16 MED ORDER — TAMSULOSIN HCL 0.4 MG PO CAPS: 0.4000 mg | ORAL_CAPSULE | Freq: Every day | ORAL | 3 refills | Status: DC

## 2019-10-19 ENCOUNTER — Other Ambulatory Visit: Payer: Self-pay | Admitting: Internal Medicine

## 2019-10-20 NOTE — Telephone Encounter (Signed)
Rx request sent to pharmacy.  

## 2019-10-24 DIAGNOSIS — M961 Postlaminectomy syndrome, not elsewhere classified: Secondary | ICD-10-CM | POA: Diagnosis not present

## 2019-10-30 ENCOUNTER — Other Ambulatory Visit: Payer: Self-pay | Admitting: Family Medicine

## 2019-10-30 DIAGNOSIS — M5442 Lumbago with sciatica, left side: Secondary | ICD-10-CM

## 2019-11-02 ENCOUNTER — Encounter: Payer: Self-pay | Admitting: Psychology

## 2019-11-20 ENCOUNTER — Other Ambulatory Visit: Payer: Self-pay | Admitting: Family Medicine

## 2019-11-20 DIAGNOSIS — M5442 Lumbago with sciatica, left side: Secondary | ICD-10-CM

## 2019-11-20 NOTE — Telephone Encounter (Signed)
Last filled 11/01/2019, last OV 10/10/19

## 2019-12-13 ENCOUNTER — Other Ambulatory Visit: Payer: Self-pay | Admitting: Family Medicine

## 2019-12-13 DIAGNOSIS — M5442 Lumbago with sciatica, left side: Secondary | ICD-10-CM

## 2019-12-14 ENCOUNTER — Telehealth: Payer: Self-pay | Admitting: *Deleted

## 2019-12-14 NOTE — Telephone Encounter (Signed)
Patient scheduled for CT on 12/27/19. Needs BMET prior to.  Attempted to call patient 1-  to let him know to go to the [facility] sometime within the week before 12/15 for BMET.  2- To reschedule appointment with Dr End to be sometime after 12/15 so that we would have the results.    No answer. Left message to call back.

## 2019-12-15 NOTE — Telephone Encounter (Signed)
Spoke to patient and he verbalized understanding to get lab work at [facility] anytime between now and the [facility]. He also agreeable to moving appointment out to after the CT.  Appt rescheduled.

## 2019-12-20 ENCOUNTER — Other Ambulatory Visit: Payer: Self-pay | Admitting: *Deleted

## 2019-12-20 DIAGNOSIS — Z01810 Encounter for preprocedural cardiovascular examination: Secondary | ICD-10-CM

## 2019-12-20 DIAGNOSIS — I25118 Atherosclerotic heart disease of native coronary artery with other forms of angina pectoris: Secondary | ICD-10-CM

## 2019-12-20 LAB — HM DIABETES EYE EXAM

## 2019-12-21 ENCOUNTER — Encounter: Payer: Self-pay | Admitting: Psychology

## 2019-12-21 ENCOUNTER — Encounter: Payer: Medicare Other | Attending: Psychology | Admitting: Psychology

## 2019-12-21 ENCOUNTER — Other Ambulatory Visit: Payer: Self-pay

## 2019-12-21 DIAGNOSIS — G894 Chronic pain syndrome: Secondary | ICD-10-CM | POA: Insufficient documentation

## 2019-12-21 DIAGNOSIS — Z8659 Personal history of other mental and behavioral disorders: Secondary | ICD-10-CM | POA: Insufficient documentation

## 2019-12-21 DIAGNOSIS — M961 Postlaminectomy syndrome, not elsewhere classified: Secondary | ICD-10-CM | POA: Insufficient documentation

## 2019-12-21 NOTE — Progress Notes (Signed)
Neuropsychological Consultation   Patient:   Walter Salas   DOB:   [birth year]  MR Number:  [id number]  Location:  [facility] [place], [place] [id number] [address] [place] [address] Dept: [id number]           Date of Service:   12/21/2019  Start Time:   8 AM End Time:   10 AM  Today's visit was an in person visit that was conducted in my outpatient clinic office.  The patient and myself were present.  Provider/Observer:  Ilean Skill, Psy.D.       Clinical Neuropsychologist       Billing Code/Service: Diagnostic clinical interview  Chief Complaint:    Walter Salas is a 64 year old male who was referred by Lysle Morales, MD for psychological evaluation as part of the standard work-up for consideration of spinal cord stimulator trialing and possible implantation.  The patient has had 7 back surgeries in the past as well as 3 cervical surgeries.  The patient reports that he did well and improved after some of the earlier back surgeries but after the development of his most recent complication and surgery last April/May that his pain continued to persist with little relief.  This level of pain is had a significant deleterious effect on his overall life functioning.  The patient is now being considered for spinal cord stimulator and the purpose of this evaluation is part of the standard protocols for work-up for consideration of these procedures.  Reason for Service:  Walter Salas is a 64 year old male who was referred by Lysle Morales, MD for psychological evaluation as part of the standard work-up for consideration of spinal cord stimulator trialing and possible implantation.  The patient has had 7 back surgeries in the past as well as 3 cervical surgeries.  The patient reports that he did well and improved after some of the earlier back surgeries but after the development of his  most recent complication and surgery last April/May that his pain continued to persist with little relief.  This level of pain is had a significant deleterious effect on his overall life functioning.  The patient is now being considered for spinal cord stimulator and the purpose of this evaluation is part of the standard protocols for work-up for consideration of these procedures.  The patient is carried previous diagnoses of failed back surgical syndrome, lumbar radiculopathy, peripheral neuropathy, possible agent orange exposure in impacts, coronary artery disease, skin cancer, congestive heart failure, chronic pain, depression, hyperlipidemia, hypertension, myocardial infarction, sleep apnea and thoracic aortic aneurysm.  The patient reports that his primary difficulties have to do with the residual, chronic severe pain in his back and legs.  The patient denies any current significant issues with depression but does acknowledge significant stressors in his life related to his wife who is 47 years old and having been diagnosed with Alzheimer's having continuing deterioration in her cognitive functioning and mood.  Her difficulties include significant short-term memory deficits and mood changes.  The patient's wife has a significant family history of Alzheimer's and he was involved in helping with other family members so he has some familiar with the course and expectations with late onset Alzheimer's dementia.  The patient reports that he gets help from both her family as well as his family as he is the primary caretaker for his wife.  The patient reports that while it is difficult he is  managing it fairly well but it is a stressor for him.  The patient reports that he has regular sleep that he describes as good although he has been diagnosed in the past with sleep apnea.  The patient reports that he goes to bed around 11 PM and gets up at 4 AM.  The patient reports that he has been a lifelong early riser and  feels rested in the morning.  He reports that he does take a nap during the day but is able to fall asleep fine at night.  The patient reports good appetite and that his memory and cognition are good.  Reliability of Information: Information is derived from 1 hour face-to-face clinical interview with the patient as well as review of available medical records.  Behavioral Observation: Walter Salas  presents as a 64 y.o.-year-old Right handed Male who appeared his stated age. his dress was Appropriate and he was Well Groomed and his manners were Appropriate to the situation.  his participation was indicative of Appropriate and Attentive behaviors.  There were physical disabilities noted.  he displayed an appropriate level of cooperation and motivation.     Interactions:    Active Appropriate and Attentive  Attention:   within normal limits and attention span and concentration were age appropriate  Memory:   within normal limits; recent and remote memory intact  Visuo-spatial:  not examined  Speech (Volume):  normal  Speech:   normal; normal  Thought Process:  Coherent and Relevant  Though Content:  WNL; not suicidal and not homicidal  Orientation:   person, place, time/date and situation  Judgment:   Good  Planning:   Good  Affect:    Blunted  Mood:    Dysphoric  Insight:   Good  Intelligence:   normal  Marital Status/Living: The patient was born and raised in [place] along with 3 siblings.  The patient currently lives with his wife of 92 years.  They have no children.  Current Employment: The patient is retired.  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  The patient served in the [employer] between [place] in 1976.  He was exposed to agent orange during his service.  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  Substance Use:  There is a documented history of tobacco abuse confirmed by the patient.  No other substance abuse is noted.  The patient tries to use pain medications to a minimal degree.  He does have prescriptions for tramadol and has used Flexeril in the past.    Education:   HS Graduate  Medical History:   Past Medical History:  Diagnosis Date  . Agent orange exposure   . Anginal pain ([place])   . Arthritis   . CAD (coronary artery disease)    a. s/p PCI/DES to the LCx and RCA in 2014  . Cancer ([place])    skin  . CHF (congestive heart failure) ([place])   . Chronic cough   . Chronic pain   . Colon polyp   . Depression   . Diabetes mellitus without complication ([place])   . Diverticulosis   . History of kidney stones   . History of stomach ulcers   . HOH (hard of hearing)   . Hx of laminectomy 1999  . Hyperlipidemia   .  Hypertension   . Kidney stone   . Myocardial infarction ([place])   . Sleep apnea   . Syncope    a. Event monitor 2018: NSR with first-degree AV block, no significant arrhythmias or pauses; b.  Myoview showed a small in size, mild in severity fixed basal and anterior lateral defect, no ischemia, LVEF 55 to 65%, low risk study  . Thoracic aortic aneurysm ([place])    a. TTE 2018: EF 60-65%, normal wall motion, Gr1DD, mild aortic valve calcification, mildly dilated aortic root measuring up to 4.2 cm, normal RV cavity size and systolic function, normal PASP; b. CTA aorta 6/18:showed an ascending thoracic aortic aneurysmal dilatation with a maximum diameter of 4.3 cm  . Ulcer          Patient Active Problem List   Diagnosis Date Noted  . Urinary frequency 10/10/2019  . Nicotine dependence, cigarettes, uncomplicated 17/51/0258  . Skin lesion 06/29/2018  . Lung nodule 02/22/2018  . Thoracic aortic aneurysm without rupture ([place]) 01/24/2018  . History of syncope 01/24/2018  . Obesity (BMI 30.0-34.9) 10/21/2017  .  History of tobacco abuse 06/28/2017  . Rib pain on left side 04/20/2017  . H. pylori infection 03/28/2017  . B12 deficiency 03/26/2017  . Thyroid nodule 11/11/2016  . History of colon polyps 11/11/2016  . Urinary incontinence 04/20/2016  . Onychomycosis 02/11/2016  . Chronic back pain 05/15/2015  . Nodule of finger of left hand 05/15/2015  . Knee pain, bilateral 11/15/2014  . Diastasis recti 03/21/2014  . Hereditary and idiopathic peripheral neuropathy 08/14/2013  . Unspecified vitamin D deficiency 08/14/2013  . Diabetes mellitus, type 2 ([place]) 04/28/2013  . Essential hypertension 04/28/2013  . Coronary artery disease 04/28/2013  . Mixed hyperlipidemia 04/28/2013  . Dorsal (thoracic) vertebral fracture ([place]) 12/15/2012  . History of subdural hematoma 12/14/2012              Abuse/Trauma History: The patient did see active duty in his [employer].  Psychiatric History:  Patient has had prior diagnoses of depression and has been prescribed Cymbalta in the past for depressive symptoms.  His last prescription for Cymbalta was in 2020.  The patient denies any significant depressive symptomatology currently and although he is having significant stress with his wife's difficulties and deterioration due to late onset Alzheimer's he reports that he is managing that fairly well.  Family Med/Psych History:  Family History  Problem Relation Age of Onset  . Diabetes Mother   . Heart disease Father   . Diabetes Father   . Diabetes Sister   . Heart disease Sister   . Diabetes Brother   . Heart disease Brother   . Heart disease Paternal Grandfather   . Cancer Neg Hx     Impression/DX:  Walter Salas is a 64 year old male who was referred by Lysle Morales, MD for psychological evaluation as part of the standard work-up for consideration of spinal cord stimulator trialing and possible implantation.  The patient has had 7 back surgeries in the past as well as 3 cervical surgeries.  The  patient reports that he did well and improved after some of the earlier back surgeries but after the development of his most recent complication and surgery last April/May that his pain continued to persist with little relief.  This level of pain is had a significant deleterious effect on his overall life functioning.  The patient is now being considered for spinal cord stimulator and the purpose of this evaluation is part of  the standard protocols for work-up for consideration of these procedures.  The patient describes past history of some depression that was effectively treated with Cymbalta and he has had some significant stressors going on particularly related to his wife's deterioration due to late onset Alzheimer's dementia.  He reports that he is managing this stressor fairly well and he was involved in helping his wife take care of other family members in the past and is familiar with the course.  He does have support from both her family members as well as his own family members and helping him although he is the primary caretaker.  The patient's cognitive functioning and mental status appear to be within normal limits.  Disposition/Plan:  The patient is also been provided materials to complete the [place] multiphasic personality inventory as well as the pain patient profile to get objective assessments of current psychological status as part of the objective portion of this psychological evaluation.  Once the patient has completed these inventories a formal report will be constructed and provided to the patient's referring physician as part of the standard protocol for work-up for consideration of spinal cord stimulator.  Diagnosis:    History of depression  Failed back surgical syndrome  Chronic pain syndrome         Electronically Signed   _______________________ Ilean Skill, Psy.D. Clinical Neuropsychologist

## 2019-12-22 ENCOUNTER — Other Ambulatory Visit
Admission: RE | Admit: 2019-12-22 | Discharge: 2019-12-22 | Disposition: A | Discharge status: Home / Self Care | Payer: Medicare Other | Source: Ambulatory Visit | Attending: Internal Medicine | Admitting: Internal Medicine

## 2019-12-22 DIAGNOSIS — I25118 Atherosclerotic heart disease of native coronary artery with other forms of angina pectoris: Secondary | ICD-10-CM | POA: Diagnosis not present

## 2019-12-22 DIAGNOSIS — Z01810 Encounter for preprocedural cardiovascular examination: Secondary | ICD-10-CM

## 2019-12-22 LAB — BASIC METABOLIC PANEL
Anion gap: 11 (ref 5–15)
BUN: 22 mg/dL (ref 8–23)
CO2: 27 mmol/L (ref 22–32)
Calcium: 8.9 mg/dL (ref 8.9–10.3)
Chloride: 99 mmol/L (ref 98–111)
Creatinine, Ser: 0.8 mg/dL (ref 0.61–1.24)
GFR, Estimated: >60 mL/min (ref >60)
Glucose, Bld: 130 mg/dL — ABNORMAL HIGH (ref 70–99)
Potassium: 3.5 mmol/L (ref 3.5–5.1)
Sodium: 137 mmol/L (ref 135–145)

## 2019-12-25 DIAGNOSIS — E119 Type 2 diabetes mellitus without complications: Secondary | ICD-10-CM | POA: Diagnosis not present

## 2019-12-25 LAB — HM DIABETES EYE EXAM

## 2019-12-27 ENCOUNTER — Other Ambulatory Visit: Payer: Self-pay

## 2019-12-27 ENCOUNTER — Ambulatory Visit
Admission: RE | Admit: 2019-12-27 | Discharge: 2019-12-27 | Disposition: A | Discharge status: Home / Self Care | Payer: Medicare Other | Source: Ambulatory Visit | Attending: Internal Medicine | Admitting: Internal Medicine

## 2019-12-27 ENCOUNTER — Ambulatory Visit: Payer: Medicare Other | Admitting: Internal Medicine

## 2019-12-27 DIAGNOSIS — I712 Thoracic aortic aneurysm, without rupture, unspecified: Secondary | ICD-10-CM

## 2019-12-27 MED ORDER — IOHEXOL 350 MG/ML SOLN
75.0000 mL | Freq: Once | INTRAVENOUS | Status: AC | PRN
  Administered 2019-12-27: 11:00:00 75 mL via INTRAVENOUS

## 2019-12-28 ENCOUNTER — Encounter: Payer: Self-pay | Admitting: Nurse Practitioner

## 2019-12-28 NOTE — Progress Notes (Signed)
Office Visit    Patient Name: Walter Salas Date of Encounter: 12/29/2019  Primary Care Provider:  Leone Haven, MD Primary Cardiologist:  Nelva Bush, MD  Chief Complaint    64 year old male with a history of coronary artery disease status post prior stenting to the circumflex and RCA in 2014, HFpEF, hypertension, hyperlipidemia, diabetes, thoracic aortic aneurysm (4.3 cm), tobacco abuse, depression, nephrolithiasis, fall with intracranial hemorrhage, and arthritis, who presents for follow-up of CAD and thoracic aortic aneurysm.  Past Medical History    Past Medical History:  Diagnosis Date  . (HFpEF) heart failure with preserved ejection fraction ([place])    a. 2018 Echo: EF 60-65%, normal wall motion, Gr1DD, mild aortic valve calcification; b. 12/2017 Echo: EF 60-65%, no rwma, Ao root 4.3cm, Asc Ao 4.4 cm, mildly dil LA.  Marland Kitchen Agent orange exposure   . Anginal pain ([place])   . Arthritis   . CAD (coronary artery disease)    a. s/p PCI/DES to the LCx and RCA in 2014; b. 05/2016 MV: EF 55-65%. Small, mild defect in basal anterolateral location->infarct w/o ischemia.  . Cancer ([place])    skin  . Chronic cough   . Chronic pain   . Colon polyp   . Depression   . Diabetes mellitus without complication ([place])   . Diverticulosis   . History of kidney stones   . History of stomach ulcers   . HOH (hard of hearing)   . Hx of laminectomy 1999  . Hyperlipidemia   . Hypertension   . Kidney stone   . Myocardial infarction ([place])   . Sleep apnea   . Syncope    a. Event monitor 2018: NSR with first-degree AV block, no significant arrhythmias or pauses; b.  Myoview showed a small in size, mild in severity fixed basal and anterior lateral defect, no ischemia, LVEF 55 to 65%, low risk study  . Thoracic aortic aneurysm ([place])    a. TTE 2018: Mildly dil Ao root- 4.2 cm; b. CTA aorta 6/18: Asc Ao aneurysmal dil w max diam of 4.3 cm; b. 12/2019 CTA Chest: stable 4.3cm thor Ao Aneurysm.  Marland Kitchen  Ulcer    Past Surgical History:  Procedure Laterality Date  . [place]  . CARPAL TUNNEL RELEASE Bilateral 1990  . CATARACT EXTRACTION W/PHACO Left 03/08/2018   Procedure: CATARACT EXTRACTION PHACO AND INTRAOCULAR LENS PLACEMENT ([place]) LEFT;  Surgeon: Birder Robson, MD;  Location: [facility];  Service: Ophthalmology;  Laterality: Left;  [place]  00:24.2 CDE 3.25 Fluid pack lot # [id number]  . CATARACT EXTRACTION W/PHACO Right 05/24/2018   Procedure: CATARACT EXTRACTION PHACO AND INTRAOCULAR LENS PLACEMENT ([facility]) RIGHT;  Surgeon: Birder Robson, MD;  Location: [facility];  Service: Ophthalmology;  Laterality: Right;  Diabetic - oral meds  . CERVICAL FUSION  2000. 2003  . CHOLECYSTECTOMY  2000  . COLONOSCOPY WITH PROPOFOL N/A 02/23/2017   Procedure: COLONOSCOPY WITH PROPOFOL;  Surgeon: Lollie Sails, MD;  [facility];  Service: Endoscopy;  Laterality: N/A;  . CORONARY ANGIOPLASTY WITH STENT PLACEMENT  2014   [facility] [employer]  . ESOPHAGOGASTRODUODENOSCOPY (EGD) WITH PROPOFOL N/A 02/23/2017   Procedure: ESOPHAGOGASTRODUODENOSCOPY (EGD) WITH PROPOFOL;  Surgeon: Lollie Sails, MD;  Location: [facility] ENDOSCOPY;  Service: Endoscopy;  Laterality: N/A;  . SHOULDER ARTHROSCOPY Right 2012  . TUMOR EXCISION  2007    Allergies  Allergies  Allergen Reactions  . Gabapentin Other (See  Comments)    "makes me crazy" MENTAL STATUS CHANGES   . Isosorbide Mononitrate Er [Isosorbide Dinitrate] Other (See Comments)    Headache  . Tylenol [Acetaminophen] Other (See Comments)    GI upset    History of Present Illness    64 year old male with the above past medical history including CAD status post prior stenting to the circumflex and RCA in 2014, HFpEF, hypertension, hyperlipidemia, diabetes, tobacco abuse, thoracic aortic aneurysm (4.3 cm), depression, nephrolithiasis, fall with intracranial hemorrhage, and arthritis.  He was  last seen in cardiology clinic in June of this year, at which time he was doing well.  Earlier this week, he underwent CT angiography of the chest and showed stable 4.3 cm thoracic aortic aneurysm with recommendation for follow-up in 1 year.  Since his last visit, he has done well.  He remains reasonably active in and around his home and denies chest pain or dyspnea.  He does weigh daily and notes that his weights have been stable.  He checks his blood pressure regularly and notes systolics in the 562-130 range.  Unfortunately, he continues to smoke 1 pack/day.  He says he is trying to quit 100 times and believes he will smoke until the day he denies.  He denies palpitations, PND, orthopnea, dizziness, syncope, edema, or early satiety.  Home Medications    Prior to Admission medications   Medication Sig Start Date End Date Taking? Authorizing Provider  amLODipine (NORVASC) 2.5 MG tablet Take 1 tablet (2.5 mg total) by mouth daily. 10/20/19   End, Harrell Gave, MD  aspirin 81 MG tablet Take 81 mg by mouth daily.    [provider]  atorvastatin (LIPITOR) 80 MG tablet Take 80 mg by mouth at bedtime.    [provider]  chlorthalidone (HYGROTON) 50 MG tablet Take 25 mg by mouth daily.    [provider]  cholecalciferol (VITAMIN D) 1000 UNITS tablet Take 1 tablet (1,000 Units total) by mouth QID. Patient taking differently: 4000 IU daily 11/15/14   Jackolyn Confer, MD  cyclobenzaprine (FLEXERIL) 10 MG tablet Take 10 mg by mouth 2 (two) times daily.  10/06/18   [provider]  DULoxetine (CYMBALTA) 20 MG capsule Take 20 mg by mouth daily.    [provider]  fenofibrate (TRICOR) 145 MG tablet Take 1 tablet (145 mg total) by mouth daily. 11/15/14   Jackolyn Confer, MD  glucose blood test strip Check once daily, E11.9 02/22/18   Leone Haven, MD  lidocaine (LIDODERM) 5 % Place 1 patch onto the skin daily. Remove & Discard patch within 12 hours or as  directed by MD Patient taking differently: Place 1 patch onto the skin daily as needed (pain). Remove & Discard patch within 12 hours or as directed by MD 04/28/13   Jackolyn Confer, MD  lidocaine (LIDODERM) 5 % 1 patch daily as needed-Remove & Discard patch within 12 hours or as directed by MD    [provider]  losartan (COZAAR) 25 MG tablet Take 25 mg by mouth daily.    [provider]  metFORMIN (GLUCOPHAGE) 1000 MG tablet Take 1,000 mg by mouth 2 (two) times daily with a meal.    [provider]  methocarbamol (ROBAXIN) 500 MG tablet Take 500 mg by mouth 2 (two) times daily as needed for muscle spasms.     [provider]  methyl salicylate liquid Apply 1 application topically as needed for muscle pain.    [provider]  methylPREDNISolone (MEDROL DOSEPAK) 4 MG TBPK tablet Take according to pack instructions 09/12/18   Guse, Jacquelynn Cree, FNP  nitroGLYCERIN (NITROSTAT) 0.4 MG SL tablet Place 1 tablet (0.4 mg total) under the tongue every 5 (five) minutes as needed for chest pain. May take up to 3 doses. 03/26/17   Leone Haven, MD  omeprazole (PRILOSEC) 20 MG capsule Take 20 mg by mouth daily.    [provider]  polyvinyl alcohol (LIQUIFILM TEARS) 1.4 % ophthalmic solution Place 1 drop into both eyes as needed for dry eyes.     [provider]  potassium chloride SA (K-DUR,KLOR-CON) 20 MEQ tablet Take 1 tablet (20 mEq total) by mouth daily. 06/25/16   End, Harrell Gave, MD  pregabalin (LYRICA) 50 MG capsule Take 1 capsule (50 mg total) by mouth 3 (three) times daily. 12/04/18   McLean-Scocuzza, Nino Glow, MD  tamsulosin (FLOMAX) 0.4 MG CAPS capsule Take 1 capsule (0.4 mg total) by mouth daily. 10/16/19   Leone Haven, MD  traMADol (ULTRAM) 50 MG tablet TAKE 2 TABLETS BY MOUTH EVERY 8 HOURS ASNEEDED FOR MODERATE PAIN 12/15/19   Leone Haven, MD  traZODone (DESYREL) 100 MG tablet Take 100 mg by mouth at bedtime as needed for  sleep.     [provider]  VASCEPA 1 g capsule TAKE 2 CAPSULES BY MOUTH TWICE DAILY 08/18/19   Leone Haven, MD  vitamin B-12 (CYANOCOBALAMIN) 1000 MCG tablet Take 1 tablet (1,000 mcg total) by mouth daily. 11/15/14   Jackolyn Confer, MD    Review of Systems    He denies chest pain, palpitations, dyspnea, pnd, orthopnea, n, v, dizziness, syncope, edema, weight gain, or early satiety.  All other systems reviewed and are otherwise negative except as noted above.  Physical Exam    VS:  BP 130/60 (BP Location: Left Arm, Patient Position: Sitting, Cuff Size: Normal)   Pulse 73   Ht 5\' 10"  (1.778 m)   Wt 216 lb (98 kg)   SpO2 97%   BMI 30.99 kg/m  , BMI Body mass index is 30.99 kg/m. GEN: Well nourished, well developed, in no acute distress. HEENT: normal. Neck: Supple, no JVD, carotid bruits, or masses. Cardiac: RRR, no murmurs, rubs, or gallops. No clubbing, cyanosis, edema.  Radials/PT 2+ and equal bilaterally.  Respiratory:  Respirations regular and unlabored, clear to auscultation bilaterally. GI: Soft, nontender, nondistended, BS + x 4. MS: no deformity or atrophy. Skin: warm and dry, no rash. Neuro:  Strength and sensation are intact. Psych: Normal affect.  Accessory Clinical Findings    ECG personally reviewed by me today -regular sinus rhythm, 73, first-degree AV block, no acute ST or T changes.  PR interval more similar to ECG December 2020 as opposed to June 2021- no acute changes.  Lab Results  Component Value Date   WBC 6.3 10/02/2019   HGB 15.3 10/02/2019   HCT 44.4 10/02/2019   MCV 82.1 10/02/2019   PLT 225 10/02/2019   Lab Results  Component Value Date   CREATININE 0.80 12/22/2019   BUN 22 12/22/2019   NA 137 12/22/2019   K 3.5 12/22/2019   CL 99 12/22/2019   CO2 27 12/22/2019   Lab Results  Component Value Date   ALT 17 10/02/2019   AST 20 10/02/2019   ALKPHOS 50 10/02/2019   BILITOT 0.8 10/02/2019   Lab Results  Component Value  Date   CHOL 159 08/07/2019   HDL 25.90 (L) 08/07/2019  LDLCALC 73 10/18/2017   LDLDIRECT 62.0 08/07/2019   TRIG (H) 08/07/2019    525.0 Triglyceride is over 400; calculations on Lipids are invalid.   CHOLHDL 6 08/07/2019    Lab Results  Component Value Date   HGBA1C 7.2 (H) 08/07/2019    Assessment & Plan    1.  Coronary artery disease: Status post prior stenting to the left circumflex and right coronary artery in 2014 with subsequent low risk stress test in May 2018.  He has been doing well over the past 6 months without any chest pain or dyspnea.  He remains on aspirin, statin, calcium channel blocker, and ARB therapy.  2.  Essential hypertension: Blood pressure stable today 130/60 with home pressures typically running in the 120s.  Remains on calcium channel blocker, ARB and chlorthalidone.  3.  Hyperlipidemia/hypertriglyceridemia: Direct LDL of 62 in July of this year.  Triglycerides were 525 at that time, which was actually an improvement.  He is on high potency statin therapy as well as fenofibrate and Vascepa.  4.  Type 2 diabetes mellitus: A1c 7.2 in July of this year.  He is on Metformin and per primary care notes, he is also on Ozempic.  There was discussion at the [place] about initiating dapagliflozin but only after discussing with primary care.  5.  Ongoing tobacco abuse: Still smoking a pack a day.  We discussed the importance of complete cessation the patient says he has tried many times and is no longer interested in trying to quit.  6.  Thoracic aortic aneurysm: Stable by recent CTA at 4.3 cm.  Continue focus on blood pressure management and smoking cessation.  He will need follow-up CTA in December of next year.  7.  Disposition: Follow-up in 6 months or sooner if necessary.   Murray Hodgkins, NP 12/29/2019, 8:20 AM

## 2019-12-29 ENCOUNTER — Ambulatory Visit: Payer: Medicare Other | Admitting: Nurse Practitioner

## 2019-12-29 ENCOUNTER — Other Ambulatory Visit: Payer: Self-pay

## 2019-12-29 ENCOUNTER — Encounter: Payer: Self-pay | Admitting: Nurse Practitioner

## 2019-12-29 ENCOUNTER — Telehealth: Payer: Self-pay | Admitting: *Deleted

## 2019-12-29 VITALS — BP 130/60 | HR 73 | Ht 70.0 in | Wt 216.0 lb

## 2019-12-29 DIAGNOSIS — I712 Thoracic aortic aneurysm, without rupture, unspecified: Secondary | ICD-10-CM

## 2019-12-29 DIAGNOSIS — Z720 Tobacco use: Secondary | ICD-10-CM

## 2019-12-29 DIAGNOSIS — I2510 Atherosclerotic heart disease of native coronary artery without angina pectoris: Secondary | ICD-10-CM

## 2019-12-29 DIAGNOSIS — I10 Essential (primary) hypertension: Secondary | ICD-10-CM | POA: Diagnosis not present

## 2019-12-29 DIAGNOSIS — E785 Hyperlipidemia, unspecified: Secondary | ICD-10-CM

## 2019-12-29 DIAGNOSIS — E781 Pure hyperglyceridemia: Secondary | ICD-10-CM

## 2019-12-29 NOTE — Telephone Encounter (Signed)
-----   Message from Nelva Bush, MD sent at 12/28/2019  3:44 PM EST ----- Please let Walter Salas know that his mildly enlarged ascending aorta is stable in appearance.  He should continue his current medications, with annual CT follow-up of his thoracic aortic aneurysm.

## 2019-12-29 NOTE — Patient Instructions (Signed)
Medication Instructions:  Your physician recommends that you continue on your current medications as directed. Please refer to the Current Medication list given to you today.  *If you need a refill on your cardiac medications before your next appointment, please call your pharmacy*   Lab Work: None ordered If you have labs (blood work) drawn today and your tests are completely normal, you will receive your results only by: . MyChart Message (if you have MyChart) OR . A paper copy in the mail If you have any lab test that is abnormal or we need to change your treatment, we will call you to review the results.   Testing/Procedures: None ordered   Follow-Up: At [facility], you and your health needs are our priority.  As part of our continuing mission to provide you with exceptional heart care, we have created designated Provider Care Teams.  These Care Teams include your primary Cardiologist (physician) and Advanced Practice Providers (APPs -  Physician Assistants and Nurse Practitioners) who all work together to provide you with the care you need, when you need it.  We recommend signing up for the patient portal called "MyChart".  Sign up information is provided on this After Visit Summary.  MyChart is used to connect with patients for Virtual Visits (Telemedicine).  Patients are able to view lab/test results, encounter notes, upcoming appointments, etc.  Non-urgent messages can be sent to your provider as well.   To learn more about what you can do with MyChart, go to [email or web address].    Your next appointment:   6 month(s)  The format for your next appointment:   In Person  Provider:   You may see Christopher End, MD or one of the following Advanced Practice Providers on your designated Care Team:    Christopher Berge, NP  Ryan Dunn, PA-C  Jacquelyn Visser, PA-C  Cadence Furth, PA-C  Caitlin Walker, NP    Other Instructions N/A  

## 2019-12-29 NOTE — Telephone Encounter (Signed)
Patient made aware of results at patient appointment. Repeat CTA for in one year order entered.

## 2020-01-01 ENCOUNTER — Other Ambulatory Visit: Payer: Self-pay | Admitting: Family Medicine

## 2020-01-04 ENCOUNTER — Encounter: Payer: Medicare Other | Attending: Psychology | Admitting: Psychology

## 2020-01-04 DIAGNOSIS — M961 Postlaminectomy syndrome, not elsewhere classified: Secondary | ICD-10-CM | POA: Insufficient documentation

## 2020-01-04 DIAGNOSIS — G894 Chronic pain syndrome: Secondary | ICD-10-CM | POA: Insufficient documentation

## 2020-01-04 DIAGNOSIS — Z8659 Personal history of other mental and behavioral disorders: Secondary | ICD-10-CM | POA: Insufficient documentation

## 2020-01-08 ENCOUNTER — Other Ambulatory Visit: Payer: Self-pay | Admitting: Family Medicine

## 2020-01-08 DIAGNOSIS — M5442 Lumbago with sciatica, left side: Secondary | ICD-10-CM

## 2020-01-10 ENCOUNTER — Encounter: Payer: Self-pay | Admitting: Psychology

## 2020-01-10 ENCOUNTER — Encounter: Payer: Medicare Other | Attending: Psychology | Admitting: Psychology

## 2020-01-10 ENCOUNTER — Other Ambulatory Visit: Payer: Self-pay

## 2020-01-10 DIAGNOSIS — G894 Chronic pain syndrome: Secondary | ICD-10-CM | POA: Diagnosis not present

## 2020-01-10 DIAGNOSIS — Z8659 Personal history of other mental and behavioral disorders: Secondary | ICD-10-CM

## 2020-01-10 DIAGNOSIS — M961 Postlaminectomy syndrome, not elsewhere classified: Secondary | ICD-10-CM | POA: Diagnosis not present

## 2020-01-10 NOTE — Progress Notes (Signed)
Neuropsychological Evaluation   Patient:  Walter Salas   DOB: [birth year]  MR Number: [id number]  Location: [facility] [address] [place], [id number] [place] [address] Dept: [phone]  Start: 8:30 AM End: 10 AM  Today's visit consisted of 2 separate entities.  The first 30 minutes of this visit entailed scoring and review of the patient's MMPI-2 and the patient's pain patient profile.  The second hour was spent in formal interpretation and formal report writing of the objective data in conjunction with subjective symptoms and medical history.  Provider/Observer:     Edgardo Roys PsyD  Chief Complaint:      Chief Complaint  Patient presents with  . Pain  . Back Pain    Reason For Service:     Walter Salas is a 64 year old male who was referred by Lysle Morales, MD for psychological evaluation as part of the standard work-up for consideration of spinal cord stimulator trialing and possible implantation.  The patient has had 7 back surgeries in the past as well as 3 cervical surgeries.  The patient reports that he did well and improved after some of the earlier back surgeries but after the development of his most recent complication and surgery last April/May that his pain continued to persist with little relief.  This level of pain is had a significant deleterious effect on his overall life functioning.  The patient is now being considered for spinal cord stimulator and the purpose of this evaluation is part of the standard protocols for work-up for consideration of these procedures.  The patient is carried previous diagnoses of failed back surgical syndrome, lumbar radiculopathy, peripheral neuropathy, possible agent orange exposure in impacts, coronary artery disease, skin cancer, congestive heart failure, chronic pain, depression, hyperlipidemia, hypertension,  myocardial infarction, sleep apnea and thoracic aortic aneurysm.  The patient reports that his primary difficulties have to do with the residual, chronic severe pain in his back and legs.  The patient denies any current significant issues with depression but does acknowledge significant stressors in his life related to his wife who is 54 years old and having been diagnosed with Alzheimer's having continuing deterioration in her cognitive functioning and mood.  Her difficulties include significant short-term memory deficits and mood changes.  The patient's wife has a significant family history of Alzheimer's and he was involved in helping with other family members so he has some familiar with the course and expectations with late onset Alzheimer's dementia.  The patient reports that he gets help from both her family as well as his family as he is the primary caretaker for his wife.  The patient reports that while it is difficult he is managing it fairly well but it is a stressor for him.  The patient reports that he has regular sleep that he describes as good although he has been diagnosed in the past with sleep apnea.  The patient reports that he goes to bed around 11 PM and gets up at 4 AM.  The patient reports that he has been a lifelong early riser and feels rested in the morning.  He reports that he does take a nap during the day but is able to fall asleep fine at night.  The patient reports good appetite and that his memory and cognition are good.  Testing Administered:  The patient completed the [place] multiphasic personality inventory-2 as well as the pain patient  profile (P3)  Participation Level:   Active  Participation Quality:  Appropriate and Attentive      Behavioral Observation:  Well Groomed, Alert, and Appropriate.   Test Results:   Initially, the patient completed the [place] multiphasic personality inventory 2.  Review of validity scales on this measure suggest that the patient  approach this measure in an honest and straightforward manner overall and neither attempted to exaggerate or minimize any clinical symptomatology.  This does appear to be a valid assessment utilizing the MMPI.  The resulting basic/clinical scales show no clinically significant elevations on any of the primary scales.  There were 2 relative elevations on his measures related to specific concerns about help functioning and medical status.  The patient described very specific health concerns and issues related to pain etc.  These would be consistent with subjective reports and medical history.  Further analysis utilizing content and supplemental scales highlight these continued or heightened focus on health status with no indications of any clinically significant depression, anxiety or other psychiatric/psychological symptomatology.  Supplemental scales showed no significant elevations on measures indicating significant history of PTSD or traumatic experiences.  There was not a significant elevation in his McAndrews alcohol scale which is associated with propensity towards substance abuse.  Again, most of the elevations were specific to somatic complaints and health concerns and other physical malfunction which are consistent with his clinical presentation and there were no indications of significant psychiatric illness or significant psychosocial distress.  The patient then completed the pain patient profile which allows for specific comparisons to to both community-based normative samples as well as specific comparisons to chronic pain patients without significant psychiatric symptomatology.  The patient's validity scale was within acceptable limits and there were no indications the patient neither attempted to exaggerate or minimize current symptomatology.  The patient did not show any significant elevations either to a community-based normative sample or a chronic pain patient for issues related to  somatizations of symptoms anxiety or depression.  Overall, this pattern is consistent with findings on his MMPI that strongly point towards specific concerns about pain, somatic complaints and physical/medical difficulties and no disruptive psychological or psychiatric issues.  Impression/Diagnosis:   The results of the current psychological evaluation, taking into account both the patient's subjective report and history including past medical history as well as objective psychological measures including the MMPI and the pain patient profile, strongly indicates that the patient is an excellent candidate for spinal cord stimulator trialing and possible implantation.  There were no indications of significant disruptive psychosocial variables or significant psychological distress that would negatively impact the patient's ability to accurately rate his pain in response during the trialing phase and the patient's cognitive functioning and psychological functioning strongly suggest that the patient would be able to follow both pre and post surgical procedures needed on his part during both the trialing and/or full implantation phases.  The patient does have 1 significant psychosocial stressor but this has been going on for some time and is not acute in nature.  The patient's wife has been diagnosed with late onset dementia of the Alzheimer's type and the patient is her primary caretaker due to her significant cognitive decline.  While it is difficult to take care of her he is managing it well and reports that he has regular sleep patterns even with her care.  Again, there are no indications of any significant psychological variables that would have that negative impact on his ability to accurately interpret pain  level responses during the spinal cord stimulator trialing and the patient has a good understanding of potential risks benefits of the procedures and is able to understand and execute pre and post surgical  requirements on his part.  Diagnosis:    History of depression  Failed back surgical syndrome  Chronic pain syndrome   _____________________ Arley Phenix, Psy.D. Clinical Neuropsychologist

## 2020-01-15 ENCOUNTER — Ambulatory Visit (INDEPENDENT_AMBULATORY_CARE_PROVIDER_SITE_OTHER): Payer: Medicare Other | Admitting: Family Medicine

## 2020-01-15 ENCOUNTER — Encounter: Payer: Self-pay | Admitting: Family Medicine

## 2020-01-15 ENCOUNTER — Other Ambulatory Visit: Payer: Self-pay

## 2020-01-15 VITALS — BP 118/68 | HR 78 | Temp 98.5°F | Ht 70.0 in | Wt 216.2 lb

## 2020-01-15 DIAGNOSIS — M545 Low back pain, unspecified: Secondary | ICD-10-CM | POA: Diagnosis not present

## 2020-01-15 DIAGNOSIS — G8929 Other chronic pain: Secondary | ICD-10-CM | POA: Diagnosis not present

## 2020-01-15 DIAGNOSIS — I10 Essential (primary) hypertension: Secondary | ICD-10-CM | POA: Diagnosis not present

## 2020-01-15 DIAGNOSIS — E119 Type 2 diabetes mellitus without complications: Secondary | ICD-10-CM

## 2020-01-15 DIAGNOSIS — I2510 Atherosclerotic heart disease of native coronary artery without angina pectoris: Secondary | ICD-10-CM

## 2020-01-15 DIAGNOSIS — I712 Thoracic aortic aneurysm, without rupture, unspecified: Secondary | ICD-10-CM

## 2020-01-15 LAB — POCT GLYCOSYLATED HEMOGLOBIN (HGB A1C): Hemoglobin A1C: 6 % — AB (ref 4.0–5.6)

## 2020-01-15 MED ORDER — NITROGLYCERIN 0.4 MG SL SUBL: 0.4000 mg | SUBLINGUAL_TABLET | SUBLINGUAL | 0 refills | Status: DC | PRN

## 2020-01-15 NOTE — Patient Instructions (Signed)
Nice to see you. Please continue with your current blood pressure and diabetic medications.

## 2020-01-15 NOTE — Assessment & Plan Note (Signed)
Well-controlled.  He will continue amlodipine 2.5 mg once daily, chlorthalidone 50 mg daily, and losartan 25 mg daily.

## 2020-01-15 NOTE — Assessment & Plan Note (Signed)
Nitroglycerin refilled.  Advised that if he has to use this 3 times he needs to be evaluated in the hospital.

## 2020-01-15 NOTE — Progress Notes (Signed)
[name], MD Phone: [phone]  Walter Salas is a 65 y.o. male who presents today for f/u  HYPERTENSION  Disease Monitoring  Home BP Monitoring 120/70 Chest pain- no    Dyspnea- no Medications  Compliance-  Taking amlodipine, chlorthalidone, losartan.   Edema- no  DIABETES Disease Monitoring: Blood Sugar ranges-<120 Polyuria/phagia/dipsia- no      Optho- UTD Medications: Compliance- taking metformin, ozempic Hypoglycemic symptoms- no  Chronic low back pain: This continues to be an issue.  Its relatively stable.  He had a neuropsych evaluation as he is going to have a stimulator placed.  He notes his tramadol and Lyrica are beneficial at decreasing his discomfort.  He does note he gets drowsy when he takes a tramadol so he does not drive when he takes it.  Social History   Tobacco Use  Smoking Status Current Every Day Smoker  . Packs/day: 1.00  . Years: 50.00  . Pack years: 50.00  . Types: Cigarettes  . Last attempt to quit: 02/15/2018  . Years since quitting: 1.9  Smokeless Tobacco Never Used     ROS see history of present illness  Objective  Physical Exam Vitals:   01/15/20 1002  BP: 118/68  Pulse: 78  Temp: 98.5 F (36.9 C)  SpO2: 97%    BP Readings from Last 3 Encounters:  01/15/20 118/68  12/29/19 130/60  10/10/19 120/70   Wt Readings from Last 3 Encounters:  01/15/20 216 lb 3.2 oz (98.1 kg)  12/29/19 216 lb (98 kg)  10/10/19 215 lb 9.6 oz (97.8 kg)    Physical Exam Constitutional:      General: He is not in acute distress.    Appearance: He is not diaphoretic.  Cardiovascular:     Rate and Rhythm: Normal rate and regular rhythm.     Heart sounds: Normal heart sounds.  Pulmonary:     Effort: Pulmonary effort is normal.     Breath sounds: Normal breath sounds.  Musculoskeletal:        General: No edema.     Right lower leg: No edema.     Left lower leg: No edema.  Skin:    General: Skin is warm and dry.  Neurological:     Mental  Status: He is alert.      Assessment/Plan: Please see individual problem list.  Problem List Items Addressed This Visit    Chronic back pain (Chronic)    Chronic ongoing issue.  He will continue to follow with his specialist.      Coronary artery disease (Chronic)    Nitroglycerin refilled.  Advised that if he has to use this 3 times he needs to be evaluated in the hospital.      Relevant Medications   nitroGLYCERIN (NITROSTAT) 0.4 MG SL tablet   Diabetes mellitus, type 2 (HCC) - Primary (Chronic)    Well-controlled.  Check A1c.  Continue Metformin 1000 mg twice daily and Ozempic 1 mg weekly.      Relevant Orders   POCT HgB A1C (Completed)   Essential hypertension (Chronic)    Well-controlled.  He will continue amlodipine 2.5 mg once daily, chlorthalidone 50 mg daily, and losartan 25 mg daily.      Relevant Medications   nitroGLYCERIN (NITROSTAT) 0.4 MG SL tablet   Thoracic aortic aneurysm without rupture (HCC)    Stable on most recent CT imaging.  He will continue yearly CTs through cardiology.      Relevant Medications   nitroGLYCERIN (NITROSTAT) 0.4  MG SL tablet      This visit occurred during the SARS-CoV-2 public health emergency.  Safety protocols were in place, including screening questions prior to the visit, additional usage of staff PPE, and extensive cleaning of exam room while observing appropriate contact time as indicated for disinfecting solutions.    Tommi Rumps, MD [place]

## 2020-01-15 NOTE — Assessment & Plan Note (Signed)
Chronic ongoing issue.  He will continue to follow with his specialist.

## 2020-01-15 NOTE — Assessment & Plan Note (Signed)
Stable on most recent CT imaging.  He will continue yearly CTs through cardiology.

## 2020-01-15 NOTE — Assessment & Plan Note (Signed)
Well-controlled.  Check A1c.  Continue Metformin 1000 mg twice daily and Ozempic 1 mg weekly.

## 2020-01-22 ENCOUNTER — Ambulatory Visit (INDEPENDENT_AMBULATORY_CARE_PROVIDER_SITE_OTHER): Payer: Medicare Other

## 2020-01-22 VITALS — Ht 70.0 in | Wt 216.0 lb

## 2020-01-22 DIAGNOSIS — Z0000 Encounter for general adult medical examination without abnormal findings: Secondary | ICD-10-CM

## 2020-01-22 NOTE — Patient Instructions (Addendum)
Mr. Scerbo , Thank you for taking time to come for your Medicare Wellness Visit. I appreciate your ongoing commitment to your health goals. Please review the following plan we discussed and let me know if I can assist you in the future.   These are the goals we discussed: Goals      Patient Stated   .  Follow up with Primary Care Provider (pt-stated)      Lower A1c Take all medications as directed Healthy diet Walk more for exercise         This is a list of the screening recommended for you and due dates:  Health Maintenance  Topic Date Due  . COVID-19 Vaccine (1) 01/31/2020*  . Hemoglobin A1C  07/14/2020  . Complete foot exam   10/09/2020  . Pneumonia vaccines (2 of 2 - PPSV23) 10/09/2020  . Eye exam for diabetics  12/19/2020  . Colon Cancer Screening  02/23/2022  . Tetanus Vaccine  04/29/2023  . Flu Shot  Completed  .  Hepatitis C: One time screening is recommended by Center for Disease Control  (CDC) for  adults born from [birth year] through 1967.   Completed  *Topic was postponed. The date shown is not the original due date.    Immunizations Immunization History  Administered Date(s) Administered  . Fluad Quad(high Dose 65+) 10/10/2019  . Influenza,inj,Quad PF,6+ Mos 11/14/2013, 11/15/2015, 10/18/2017, 09/12/2018  . Influenza-Unspecified 10/28/2012, 10/19/2014  . Pneumococcal Conjugate-13 10/10/2019  . Pneumococcal Polysaccharide-23 04/28/2013  . Tdap 04/28/2013  . Zoster Recombinat (Shingrix) 08/28/2018, 11/14/2018, 12/23/2018   Keep all routine maintenance appointments.   Follow up 07/17/20 @ 8:00  Advanced directives: End of life planning; Advance aging; Advanced directives discussed.  Copy of current HCPOA/Living Will requested.    Conditions/risks identified: none new.   Follow up in one year for your annual wellness visit.   Preventive Care 67 Years and Older, Male Preventive care refers to lifestyle choices and visits with your health care provider that can  promote health and wellness. What does preventive care include?  A yearly physical exam. This is also called an annual well check.  Dental exams once or twice a year.  Routine eye exams. Ask your health care provider how often you should have your eyes checked.  Personal lifestyle choices, including:  Daily care of your teeth and gums.  Regular physical activity.  Eating a healthy diet.  Avoiding tobacco and drug use.  Limiting alcohol use.  Practicing safe sex.  Taking low doses of aspirin every day.  Taking vitamin and mineral supplements as recommended by your health care provider. What happens during an annual well check? The services and screenings done by your health care provider during your annual well check will depend on your age, overall health, lifestyle risk factors, and family history of disease. Counseling  Your health care provider may ask you questions about your:  Alcohol use.  Tobacco use.  Drug use.  Emotional well-being.  Home and relationship well-being.  Sexual activity.  Eating habits.  History of falls.  Memory and ability to understand (cognition).  Work and work Statistician. Screening  You may have the following tests or measurements:  Height, weight, and BMI.  Blood pressure.  Lipid and cholesterol levels. These may be checked every 5 years, or more frequently if you are over 74 years old.  Skin check.  Lung cancer screening. You may have this screening every year starting at age 83 if you have a 30-pack-year history  of smoking and currently smoke or have quit within the past 15 years.  Fecal occult blood test (FOBT) of the stool. You may have this test every year starting at age 25.  Flexible sigmoidoscopy or colonoscopy. You may have a sigmoidoscopy every 5 years or a colonoscopy every 10 years starting at age 72.  Prostate cancer screening. Recommendations will vary depending on your family history and other  risks.  Hepatitis C blood test.  Hepatitis B blood test.  Sexually transmitted disease (STD) testing.  Diabetes screening. This is done by checking your blood sugar (glucose) after you have not eaten for a while (fasting). You may have this done every 1-3 years.  Abdominal aortic aneurysm (AAA) screening. You may need this if you are a current or former smoker.  Osteoporosis. You may be screened starting at age 65 if you are at high risk. Talk with your health care provider about your test results, treatment options, and if necessary, the need for more tests. Vaccines  Your health care provider may recommend certain vaccines, such as:  Influenza vaccine. This is recommended every year.  Tetanus, diphtheria, and acellular pertussis (Tdap, Td) vaccine. You may need a Td booster every 10 years.  Zoster vaccine. You may need this after age 24.  Pneumococcal 13-valent conjugate (PCV13) vaccine. One dose is recommended after age 14.  Pneumococcal polysaccharide (PPSV23) vaccine. One dose is recommended after age 62. Talk to your health care provider about which screenings and vaccines you need and how often you need them. This information is not intended to replace advice given to you by your health care provider. Make sure you discuss any questions you have with your health care provider. Document Released: 01/25/2015 Document Revised: 09/18/2015 Document Reviewed: 10/30/2014 Elsevier Interactive Patient Education  2017 [place] Prevention in the Home Falls can cause injuries. They can happen to people of all ages. There are many things you can do to make your home safe and to help prevent falls. What can I do on the outside of my home?  Regularly fix the edges of walkways and driveways and fix any cracks.  Remove anything that might make you trip as you walk through a door, such as a raised step or threshold.  Trim any bushes or trees on the path to your home.  Use  bright outdoor lighting.  Clear any walking paths of anything that might make someone trip, such as rocks or tools.  Regularly check to see if handrails are loose or broken. Make sure that both sides of any steps have handrails.  Any raised decks and porches should have guardrails on the edges.  Have any leaves, snow, or ice cleared regularly.  Use sand or salt on walking paths during winter.  Clean up any spills in your garage right away. This includes oil or grease spills. What can I do in the bathroom?  Use night lights.  Install grab bars by the toilet and in the tub and shower. Do not use towel bars as grab bars.  Use non-skid mats or decals in the tub or shower.  If you need to sit down in the shower, use a plastic, non-slip stool.  Keep the floor dry. Clean up any water that spills on the floor as soon as it happens.  Remove soap buildup in the tub or shower regularly.  Attach bath mats securely with double-sided non-slip rug tape.  Do not have throw rugs and other things on the floor  that can make you trip. What can I do in the bedroom?  Use night lights.  Make sure that you have a light by your bed that is easy to reach.  Do not use any sheets or blankets that are too big for your bed. They should not hang down onto the floor.  Have a firm chair that has side arms. You can use this for support while you get dressed.  Do not have throw rugs and other things on the floor that can make you trip. What can I do in the kitchen?  Clean up any spills right away.  Avoid walking on wet floors.  Keep items that you use a lot in easy-to-reach places.  If you need to reach something above you, use a strong step stool that has a grab bar.  Keep electrical cords out of the way.  Do not use floor polish or wax that makes floors slippery. If you must use wax, use non-skid floor wax.  Do not have throw rugs and other things on the floor that can make you trip. What can  I do with my stairs?  Do not leave any items on the stairs.  Make sure that there are handrails on both sides of the stairs and use them. Fix handrails that are broken or loose. Make sure that handrails are as long as the stairways.  Check any carpeting to make sure that it is firmly attached to the stairs. Fix any carpet that is loose or worn.  Avoid having throw rugs at the top or bottom of the stairs. If you do have throw rugs, attach them to the floor with carpet tape.  Make sure that you have a light switch at the top of the stairs and the bottom of the stairs. If you do not have them, ask someone to add them for you. What else can I do to help prevent falls?  Wear shoes that:  Do not have high heels.  Have rubber bottoms.  Are comfortable and fit you well.  Are closed at the toe. Do not wear sandals.  If you use a stepladder:  Make sure that it is fully opened. Do not climb a closed stepladder.  Make sure that both sides of the stepladder are locked into place.  Ask someone to hold it for you, if possible.  Clearly mark and make sure that you can see:  Any grab bars or handrails.  First and last steps.  Where the edge of each step is.  Use tools that help you move around (mobility aids) if they are needed. These include:  Canes.  Walkers.  Scooters.  Crutches.  Turn on the lights when you go into a dark area. Replace any light bulbs as soon as they burn out.  Set up your furniture so you have a clear path. Avoid moving your furniture around.  If any of your floors are uneven, fix them.  If there are any pets around you, be aware of where they are.  Review your medicines with your doctor. Some medicines can make you feel dizzy. This can increase your chance of falling. Ask your doctor what other things that you can do to help prevent falls. This information is not intended to replace advice given to you by your health care provider. Make sure you  discuss any questions you have with your health care provider. Document Released: 10/25/2008 Document Revised: 06/06/2015 Document Reviewed: 02/02/2014 Elsevier Interactive Patient Education  2017 [employer] 

## 2020-01-22 NOTE — Progress Notes (Signed)
Subjective:   Walter Salas is a 65 y.o. male who presents for Medicare Annual/Subsequent preventive examination.  Review of Systems    No ROS.  Medicare Wellness Virtual Visit.   Cardiac Risk Factors include: advanced age (>66men, >45 women);male gender;diabetes mellitus;hypertension     Objective:    Today's Vitals   01/22/20 0933  Weight: 216 lb (98 kg)  Height: 5\' 10"  (1.778 m)   Body mass index is 30.99 kg/m.  Advanced Directives 01/22/2020 10/02/2019 07/04/2018 05/24/2018 04/21/2017 02/23/2017 04/20/2016  Does Patient Have a Medical Advance Directive? Yes Yes Yes Yes Yes Yes Yes  Type of Paramedic of [place];Living will Living will;Healthcare Power of [place];Living will [place];Living will [place];Living will [place];Living will Living will  Does patient want to make changes to medical advance directive? No - Patient declined - No - Patient declined - No - Patient declined - No - Patient declined  Copy of [place] in Chart? No - copy requested - No - copy requested Yes - validated most recent copy scanned in chart (See row information) No - copy requested No - copy requested -    Current Medications (verified) Outpatient Encounter Medications as of 01/22/2020  Medication Sig  . amLODipine (NORVASC) 2.5 MG tablet Take 1 tablet (2.5 mg total) by mouth daily.  Marland Kitchen aspirin 81 MG tablet Take 81 mg by mouth daily.  Marland Kitchen atorvastatin (LIPITOR) 80 MG tablet Take 80 mg by mouth at bedtime.  . chlorthalidone (HYGROTON) 50 MG tablet Take 25 mg by mouth daily.  . cholecalciferol (VITAMIN D) 1000 UNITS tablet Take 1 tablet (1,000 Units total) by mouth QID. (Patient taking differently: 4000 IU daily)  . cyclobenzaprine (FLEXERIL) 10 MG tablet Take 10 mg by mouth 2 (two) times daily.   . DULoxetine (CYMBALTA) 20 MG capsule Take 20 mg by mouth daily.  .  fenofibrate (TRICOR) 145 MG tablet Take 1 tablet (145 mg total) by mouth daily.  Marland Kitchen glucose blood test strip Check once daily, E11.9  . lidocaine (LIDODERM) 5 % 1 patch daily as needed-Remove & Discard patch within 12 hours or as directed by MD  . losartan (COZAAR) 25 MG tablet Take 25 mg by mouth daily.  . metFORMIN (GLUCOPHAGE) 1000 MG tablet Take 1,000 mg by mouth 2 (two) times daily with a meal.  . methocarbamol (ROBAXIN) 500 MG tablet Take 500 mg by mouth 2 (two) times daily as needed for muscle spasms.   . methyl salicylate liquid Apply 1 application topically as needed for muscle pain.  . methylPREDNISolone (MEDROL DOSEPAK) 4 MG TBPK tablet Take according to pack instructions  . nitroGLYCERIN (NITROSTAT) 0.4 MG SL tablet Place 1 tablet (0.4 mg total) under the tongue every 5 (five) minutes as needed for chest pain. May take up to 3 doses.  Marland Kitchen omeprazole (PRILOSEC) 20 MG capsule Take 20 mg by mouth daily.  Marland Kitchen OZEMPIC, 1 MG/DOSE, 4 MG/3ML SOPN INJECT 0.75 ML (1 MG) INTO THE SKIN ONCE A WEEK  . polyvinyl alcohol (LIQUIFILM TEARS) 1.4 % ophthalmic solution Place 1 drop into both eyes as needed for dry eyes.   . potassium chloride SA (K-DUR,KLOR-CON) 20 MEQ tablet Take 1 tablet (20 mEq total) by mouth daily.  . pregabalin (LYRICA) 50 MG capsule Take 1 capsule (50 mg total) by mouth 3 (three) times daily.  . tamsulosin (FLOMAX) 0.4 MG CAPS capsule Take 1 capsule (0.4  mg total) by mouth daily.  . traMADol (ULTRAM) 50 MG tablet TAKE 2 TABLETS BY MOUTH EVERY 8 HOURS ASNEEDED FOR MODERATE PAIN  . VASCEPA 1 g capsule TAKE 2 CAPSULES BY MOUTH TWICE DAILY  . vitamin B-12 (CYANOCOBALAMIN) 1000 MCG tablet Take 1 tablet (1,000 mcg total) by mouth daily.   No facility-administered encounter medications on file as of 01/22/2020.    Allergies (verified) Gabapentin, Isosorbide mononitrate er [isosorbide dinitrate], and Tylenol [acetaminophen]   History: Past Medical History:  Diagnosis Date  . (HFpEF)  heart failure with preserved ejection fraction ([place])    a. 2018 Echo: EF 60-65%, normal wall motion, Gr1DD, mild aortic valve calcification; b. 12/2017 Echo: EF 60-65%, no rwma, Ao root 4.3cm, Asc Ao 4.4 cm, mildly dil LA.  Marland Kitchen Agent orange exposure   . Anginal pain ([place])   . Arthritis   . CAD (coronary artery disease)    a. s/p PCI/DES to the LCx and RCA in 2014; b. 05/2016 MV: EF 55-65%. Small, mild defect in basal anterolateral location->infarct w/o ischemia.  . Cancer ([place])    skin  . Chronic cough   . Chronic pain   . Colon polyp   . Depression   . Diabetes mellitus without complication ([place])   . Diverticulosis   . History of kidney stones   . History of stomach ulcers   . HOH (hard of hearing)   . Hx of laminectomy 1999  . Hyperlipidemia   . Hypertension   . Kidney stone   . Myocardial infarction ([place])   . Sleep apnea   . Syncope    a. Event monitor 2018: NSR with first-degree AV block, no significant arrhythmias or pauses; b.  Myoview showed a small in size, mild in severity fixed basal and anterior lateral defect, no ischemia, LVEF 55 to 65%, low risk study  . Thoracic aortic aneurysm ([place])    a. TTE 2018: Mildly dil Ao root- 4.2 cm; b. CTA aorta 6/18: Asc Ao aneurysmal dil w max diam of 4.3 cm; b. 12/2019 CTA Chest: stable 4.3cm thor Ao Aneurysm.  Marland Kitchen Ulcer    Past Surgical History:  Procedure Laterality Date  . [place]  . CARPAL TUNNEL RELEASE Bilateral 1990  . CATARACT EXTRACTION W/PHACO Left 03/08/2018   Procedure: CATARACT EXTRACTION PHACO AND INTRAOCULAR LENS PLACEMENT ([place]) LEFT;  Surgeon: Birder Robson, MD;  Location: [facility] ORS;  Service: Ophthalmology;  Laterality: Left;  [place]  00:24.2 CDE 3.25 Fluid pack lot # [id number]  . CATARACT EXTRACTION W/PHACO Right 05/24/2018   Procedure: CATARACT EXTRACTION PHACO AND INTRAOCULAR LENS PLACEMENT ([facility]) RIGHT;  Surgeon: Birder Robson, MD;  Location: [facility];  Service: Ophthalmology;   Laterality: Right;  Diabetic - oral meds  . CERVICAL FUSION  2000. 2003  . CHOLECYSTECTOMY  2000  . COLONOSCOPY WITH PROPOFOL N/A 02/23/2017   Procedure: COLONOSCOPY WITH PROPOFOL;  Surgeon: Lollie Sails, MD;  Location: [facility] ENDOSCOPY;  Service: Endoscopy;  Laterality: N/A;  . CORONARY ANGIOPLASTY WITH STENT PLACEMENT  [facility]   [facility] [employer]  . ESOPHAGOGASTRODUODENOSCOPY (EGD) WITH PROPOFOL N/A 02/23/2017   Procedure: ESOPHAGOGASTRODUODENOSCOPY (EGD) WITH PROPOFOL;  Surgeon: Lollie Sails, MD;  Location: [facility] ENDOSCOPY;  Service: Endoscopy;  Laterality: N/A;  . SHOULDER ARTHROSCOPY Right 2012  . TUMOR EXCISION  2007   Family History  Problem Relation Age of Onset  . Diabetes Mother   . Heart disease Father   . Diabetes  Father   . Diabetes Sister   . Heart disease Sister   . Diabetes Brother   . Heart disease Brother   . Heart disease Paternal Grandfather   . Cancer Neg Hx    Social History   Socioeconomic History  . Marital status: Married    Spouse name: Not on file  . Number of children: Not on file  . Years of education: Not on file  . Highest education level: Not on file  Occupational History  . Not on file  Tobacco Use  . Smoking status: Current Every Day Smoker    Packs/day: 1.00    Years: 50.00    Pack years: 50.00    Types: Cigarettes    Last attempt to quit: 02/15/2018    Years since quitting: 1.9  . Smokeless tobacco: Never Used  Vaping Use  . Vaping Use: Never used  Substance and Sexual Activity  . Alcohol use: No  . Drug use: No  . Sexual activity: Never  Other Topics Concern  . Not on file  Social History Narrative   Lives in [place] with wife. Has cat.   Work - Database administrator, retired      Followed at [employer].      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      Diet - Regular   Exercise - starting back at gym   Social Determinants of Health   Financial Resource Strain: Low Risk   .  Difficulty of Paying Living Expenses: Not hard at all  Food Insecurity: No Food Insecurity  . Worried About Charity fundraiser in the Last Year: Never true  . Ran Out of Food in the Last Year: Never true  Transportation Needs: No Transportation Needs  . Lack of Transportation (Medical): No  . Lack of Transportation (Non-Medical): No  Physical Activity: Insufficiently Active  . Days of Exercise per Week: 2 days  . Minutes of Exercise per Session: 20 min  Stress: No Stress Concern Present  . Feeling of Stress : Not at all  Social Connections: Unknown  . Frequency of Communication with Friends and Family: Not on file  . Frequency of Social Gatherings with Friends and Family: Not on file  . Attends Religious Services: Not on file  . Active Member of Clubs or Organizations: Not on file  . Attends Archivist Meetings: Not on file  . Marital Status: Married    Tobacco Counseling Ready to quit: Not Answered Counseling given: Not Answered   Clinical Intake:  Pre-visit preparation completed: No        Diabetes: Yes (Followed by pcp)  How often do you need to have someone help you when you read instructions, pamphlets, or other written materials from your doctor or pharmacy?: 1 - Never  Nutrition Risk Assessment: Has the patient had any N/V/D within the last 2 weeks?  No  Does the patient have any non-healing wounds?  No  Has the patient had any unintentional weight loss or weight gain?  No   Diabetes: Did the patient bring in their glucometer from home?  No  How often do you monitor your CBG's? Twice weekly.   Financial Strains and Diabetes Management: Are you having any financial strains with the device, your supplies or your medication? No .  Does the patient want to be seen by Chronic Care Management for management of their diabetes?  No  Would the patient like to be referred to a Nutritionist  or for Diabetic Management?  No   Diabetic Exams: Diabetic Eye  Exam: Completed 12/20/19 Diabetic Foot Exam: Completed 10/10/19  Interpreter Needed?: No      Activities of Daily Living In your present state of health, do you have any difficulty performing the following activities: 01/22/2020  Hearing? Y  Comment Hearing aid  Vision? N  Difficulty concentrating or making decisions? N  Walking or climbing stairs? Y  Comment Cane in use when ambulating  Dressing or bathing? N  Doing errands, shopping? N  Preparing Food and eating ? N  Using the Toilet? N  In the past six months, have you accidently leaked urine? N  Do you have problems with loss of bowel control? N  Managing your Medications? N  Managing your Finances? N  Housekeeping or managing your Housekeeping? N  Some recent data might be hidden    Patient Care Team: Leone Haven, MD as PCP - General (Family Medicine) End, Harrell Gave, MD as PCP - Cardiology (Cardiology) Jackolyn Confer, MD (Internal Medicine) Bary Castilla Forest Gleason, MD (General Surgery)  Indicate any recent Medical Services you may have received from other than [facility] providers in the past year (date may be approximate).     Assessment:   This is a routine wellness examination for Walter Salas.  Hearing/Vision screen  Hearing Screening   125Hz  250Hz  500Hz  1000Hz  2000Hz  3000Hz  4000Hz  6000Hz  8000Hz   Right ear:           Left ear:           Comments: Hearing aids  Vision Screening Comments: Followed by [facility]  Wears corrective lenses when reading  Diabetic eye exam; no retinopathy  Visual acuity not assessed per patient preference since they have regular follow up with the ophthalmologist  Dietary issues and exercise activities discussed: Current Exercise Habits: Home exercise routine, Intensity: Mild  Goals      Patient Stated   .  Follow up with Primary Care Provider (pt-stated)      Lower A1c Take all medications as directed Healthy diet Walk more for exercise        Depression  Screen PHQ 2/9 Scores 01/22/2020 10/10/2019 03/07/2019 11/02/2018 07/04/2018 10/18/2017 04/21/2017  PHQ - 2 Score 0 0 0 0 0 0 0  PHQ- 9 Score - - - - - - -    Fall Risk Fall Risk  01/22/2020 01/15/2020 03/07/2019 11/02/2018 07/04/2018  Falls in the past year? 1 1 0 0 0  Number falls in past yr: 1 1 0 0 -  Injury with Fall? 0 0 - - -  Risk Factor Category  - - - - -  Risk for fall due to : Impaired balance/gait History of fall(s) - - -  Follow up Falls evaluation completed Falls evaluation completed Falls evaluation completed Falls evaluation completed -  Comment - - - - -   FALL RISK PREVENTION PERTAINING TO THE HOME: Handrails in use when climbing stairs? Yes Home free of loose throw rugs in walkways, pet beds, electrical cords, etc? Yes  Adequate lighting in your home to reduce risk of falls? Yes   ASSISTIVE DEVICES UTILIZED TO PREVENT FALLS: Life alert? No  Use of a cane, walker or w/c? Yes  Grab bars in the bathroom? No  Shower chair or bench in shower? No  Elevated toilet seat or a handicapped toilet? Yes   TIMED UP AND GO: Was the test performed? No .  Virtual visit.   Cognitive Function: MMSE -  Mini Mental State Exam 04/21/2017 04/20/2016  Orientation to time 5 5  Orientation to Place 5 5  Registration 3 3  Attention/ Calculation 5 5  Recall 2 3  Language- name 2 objects 2 2  Language- repeat 1 1  Language- follow 3 step command 3 3  Language- read & follow direction 1 1  Write a sentence 1 1  Copy design 1 1  Total score 29 30     6CIT Screen 01/22/2020 07/04/2018  What Year? 0 points 0 points  What month? 0 points 0 points  What time? 0 points 0 points  Count back from 20 - 0 points  Months in reverse 0 points 0 points  Repeat phrase - 2 points  Total Score - 2     Immunizations Immunization History  Administered Date(s) Administered  . Fluad Quad(high Dose 65+) 10/10/2019  . Influenza,inj,Quad PF,6+ Mos 11/14/2013, 11/15/2015, 10/18/2017, 09/12/2018  .  Influenza-Unspecified 10/28/2012, 10/19/2014  . Pneumococcal Conjugate-13 10/10/2019  . Pneumococcal Polysaccharide-23 04/28/2013  . Tdap 04/28/2013  . Zoster Recombinat (Shingrix) 08/28/2018, 11/14/2018, 12/23/2018   Health Maintenance There are no preventive care reminders to display for this patient. Health Maintenance  Topic Date Due  . COVID-19 Vaccine (1) 01/31/2020 (Originally 11/07/1967)  . HEMOGLOBIN A1C  07/14/2020  . FOOT EXAM  10/09/2020  . PNA vac Low Risk Adult (2 of 2 - PPSV23) 10/09/2020  . OPHTHALMOLOGY EXAM  12/19/2020  . COLONOSCOPY (Pts 45-37yrs Insurance coverage will need to be confirmed)  02/23/2022  . TETANUS/TDAP  04/29/2023  . INFLUENZA VACCINE  Completed  . Hepatitis C Screening  Completed    Colorectal cancer screening: Type of screening: Colonoscopy. Completed 02/23/17. Repeat every 5 years  Hepatitis C Screening: Completed 03/26/17.  Vision Screening: Recommended annual ophthalmology exams for early detection of glaucoma and other disorders of the eye. Is the patient up to date with their annual eye exam?  Yes  Who is the provider or what is the name of the office in which the patient attends annual eye exams? [facility].  Dental Screening: Recommended annual dental exams for proper oral hygiene.  Community Resource Referral / Chronic Care Management: CRR required this visit?  No   CCM required this visit?  No      Plan:   Keep all routine maintenance appointments.   Follow up 07/17/20 @ 8:00  I have personally reviewed and noted the following in the patient's chart:   . Medical and social history . Use of alcohol, tobacco or illicit drugs  . Current medications and supplements . Functional ability and status . Nutritional status . Physical activity . Advanced directives . List of other physicians . Hospitalizations, surgeries, and ER visits in previous 12 months . Vitals . Screenings to include cognitive, depression, and  falls . Referrals and appointments  In addition, I have reviewed and discussed with patient certain preventive protocols, quality metrics, and best practice recommendations. A written personalized care plan for preventive services as well as general preventive health recommendations were provided to patient via mychart.     Varney Biles, LPN   [id number]

## 2020-01-23 ENCOUNTER — Telehealth: Payer: Self-pay | Admitting: Family Medicine

## 2020-01-23 ENCOUNTER — Ambulatory Visit: Payer: Medicare Other | Admitting: Psychology

## 2020-01-23 NOTE — Telephone Encounter (Signed)
Please contact the patient and see if he has been taking his lipitor. See what pharmacy he gets this from. Thanks.

## 2020-01-23 NOTE — Telephone Encounter (Signed)
Left message that I would call back later.  To a lady that answered the phone.  Walter Salas,cma

## 2020-01-29 ENCOUNTER — Other Ambulatory Visit: Payer: Self-pay | Admitting: Family Medicine

## 2020-01-29 DIAGNOSIS — M5442 Lumbago with sciatica, left side: Secondary | ICD-10-CM

## 2020-02-01 ENCOUNTER — Ambulatory Visit: Payer: Medicare Other | Admitting: Psychology

## 2020-02-05 ENCOUNTER — Other Ambulatory Visit: Payer: Self-pay | Admitting: Neurosurgery

## 2020-02-19 ENCOUNTER — Encounter
Admission: RE | Admit: 2020-02-19 | Discharge: 2020-02-19 | Disposition: A | Discharge status: Home / Self Care | Payer: Medicare Other | Source: Ambulatory Visit | Attending: Neurosurgery | Admitting: Neurosurgery

## 2020-02-19 ENCOUNTER — Other Ambulatory Visit: Payer: Self-pay

## 2020-02-19 DIAGNOSIS — Z01812 Encounter for preprocedural laboratory examination: Secondary | ICD-10-CM | POA: Diagnosis not present

## 2020-02-19 HISTORY — DX: Gastro-esophageal reflux disease without esophagitis: K21.9

## 2020-02-19 LAB — URINALYSIS, ROUTINE W REFLEX MICROSCOPIC
Bilirubin Urine: NEGATIVE
Glucose, UA: 50 mg/dL — AB
Hgb urine dipstick: NEGATIVE
Ketones, ur: NEGATIVE mg/dL
Leukocytes,Ua: NEGATIVE
Nitrite: NEGATIVE
Protein, ur: NEGATIVE mg/dL
Specific Gravity, Urine: 1.019 (ref 1.005–1.030)
pH: 5 (ref 5.0–8.0)

## 2020-02-19 LAB — CBC
HCT: 41.6 % (ref 39.0–52.0)
Hemoglobin: 13.5 g/dL (ref 13.0–17.0)
MCH: 27.6 pg (ref 26.0–34.0)
MCHC: 32.5 g/dL (ref 30.0–36.0)
MCV: 84.9 fL (ref 80.0–100.0)
Platelets: 208 10*3/uL (ref 150–400)
RBC: 4.9 MIL/uL (ref 4.22–5.81)
RDW: 14.7 % (ref 11.5–15.5)
WBC: 5.3 10*3/uL (ref 4.0–10.5)
nRBC: 0 % (ref 0.0–0.2)

## 2020-02-19 LAB — TYPE AND SCREEN
ABO/RH(D): O NEG
Antibody Screen: NEGATIVE

## 2020-02-19 LAB — PROTIME-INR
INR: 1 (ref 0.8–1.2)
Prothrombin Time: 12.3 seconds (ref 11.4–15.2)

## 2020-02-19 LAB — BASIC METABOLIC PANEL
Anion gap: 10 (ref 5–15)
BUN: 12 mg/dL (ref 8–23)
CO2: 28 mmol/L (ref 22–32)
Calcium: 8.9 mg/dL (ref 8.9–10.3)
Chloride: 98 mmol/L (ref 98–111)
Creatinine, Ser: 0.73 mg/dL (ref 0.61–1.24)
GFR, Estimated: >60 mL/min (ref >60)
Glucose, Bld: 137 mg/dL — ABNORMAL HIGH (ref 70–99)
Potassium: 3.4 mmol/L — ABNORMAL LOW (ref 3.5–5.1)
Sodium: 136 mmol/L (ref 135–145)

## 2020-02-19 LAB — SURGICAL PCR SCREEN
MRSA, PCR: NEGATIVE
Staphylococcus aureus: POSITIVE — AB

## 2020-02-19 LAB — APTT: aPTT: 28 seconds (ref 24–36)

## 2020-02-19 NOTE — Patient Instructions (Signed)
Your procedure is scheduled on: 02/26/20 Report to [facility]. Also stop at admitting desk To find out your arrival time please call [phone] between 1PM - 3PM on 02/23/20.  Remember: Instructions that are not followed completely may result in serious medical risk, up to and including death, or upon the discretion of your surgeon and anesthesiologist your surgery may need to be rescheduled.     _X__ 1. Do not eat food after midnight the night before your procedure.                 No gum chewing or hard candies. You may drink clear liquids up to 2 hours                 before you are scheduled to arrive for your surgery- DO not drink clear                 liquids within 2 hours of the start of your surgery.                 Clear Liquids include:  water, apple juice without pulp, clear carbohydrate                 drink such as Clearfast or Gatorade, Black Coffee or Tea (Do not add                 anything to coffee or tea). Diabetics water only  __X__2.  On the morning of surgery brush your teeth with toothpaste and water, you                 may rinse your mouth with mouthwash if you wish.  Do not swallow any              toothpaste of mouthwash.     _X__ 3.  No Alcohol for 24 hours before or after surgery.   _X__ 4.  Do Not Smoke or use e-cigarettes For 24 Hours Prior to Your Surgery.                 Do not use any chewable tobacco products for at least 6 hours prior to                 surgery.  ____  5.  Bring all medications with you on the day of surgery if instructed.   __X__  6.  Notify your doctor if there is any change in your medical condition      (cold, fever, infections).     Do not wear jewelry, make-up, hairpins, clips or nail polish. Do not wear lotions, powders, or perfumes.  Do not shave 48 hours prior to surgery. Men may shave face and neck. Do not bring valuables to the hospital.    [facility] is not  responsible for any belongings or valuables.  Contacts, dentures/partials or body piercings may not be worn into surgery. Bring a case for your contacts, glasses or hearing aids, a denture cup will be supplied. Leave your suitcase in the car. After surgery it may be brought to your room. For patients admitted to the hospital, discharge time is determined by your treatment team.   Patients discharged the day of surgery will not be allowed to drive home.   Please read over the following fact sheets that you were given:   MRSA Information  __X__ Take these medicines the morning of surgery with A  SIP OF WATER:    1. amLODipine (NORVASC) 2.5 MG tablet  2. DULoxetine (CYMBALTA) 20 MG capsule  3. fenofibrate (TRICOR) 145 MG tablet  4. omeprazole (PRILOSEC) 20 MG capsule  5. pregabalin (LYRICA) 50 MG capsule  6. tamsulosin (FLOMAX) 0.4 MG CAPS capsule  7.   ____ Fleet Enema (as directed)   __X__ Use CHG Soap/SAGE wipes as directed  ____ Use inhalers on the day of surgery  __X__ Stop metformin/Janumet/Farxiga 2 days prior to surgery  NONE 2/12, 2/13 THEN 2/19, 2/20 RESUME AFTER PROCEDIRES  ____ Take 1/2 of usual insulin dose the night before surgery. No insulin the morning          of surgery.   ____ Stop Blood Thinners Coumadin/Plavix/Xarelto/Pleta/Pradaxa/Eliquis/Effient/Aspirin  on   Or contact your Surgeon, Cardiologist or Medical Doctor regarding  ability to stop your blood thinners  __X__ Stop Anti-inflammatories 7 days before surgery such as Advil, Ibuprofen, Motrin,  BC or Goodies Powder, Naprosyn, Naproxen, Aleve, Aspirin    __X__ Stop all herbal supplements, fish oil or vitamin E until after surgery.    ____ Bring C-Pap to the hospital.

## 2020-02-19 NOTE — Pre-Procedure Instructions (Signed)
Patient will use same instructions for surgery 03/04/20. Was given 2 bottles of CHG to cover both procedures. Patietn verbalized understanding of instructions.

## 2020-02-20 ENCOUNTER — Telehealth: Payer: Self-pay

## 2020-02-20 NOTE — Telephone Encounter (Signed)
LVM and sent message to patient via mychart.  Willean Schurman,cma

## 2020-02-21 NOTE — Telephone Encounter (Signed)
Noted  

## 2020-02-22 ENCOUNTER — Other Ambulatory Visit: Payer: Self-pay

## 2020-02-22 ENCOUNTER — Ambulatory Visit: Payer: Medicare Other | Admitting: Psychology

## 2020-02-22 ENCOUNTER — Other Ambulatory Visit
Admission: RE | Admit: 2020-02-22 | Discharge: 2020-02-22 | Disposition: A | Discharge status: Home / Self Care | Payer: Medicare Other | Source: Ambulatory Visit | Attending: Neurosurgery | Admitting: Neurosurgery

## 2020-02-22 DIAGNOSIS — Z01812 Encounter for preprocedural laboratory examination: Secondary | ICD-10-CM | POA: Insufficient documentation

## 2020-02-22 DIAGNOSIS — Z20822 Contact with and (suspected) exposure to covid-19: Secondary | ICD-10-CM | POA: Diagnosis not present

## 2020-02-22 LAB — SARS CORONAVIRUS 2 (TAT 6-24 HRS): SARS Coronavirus 2: NEGATIVE

## 2020-02-26 ENCOUNTER — Encounter
Admission: RE | Disposition: A | Discharge status: Home / Self Care | Payer: Self-pay | Source: Home / Self Care | Attending: Neurosurgery

## 2020-02-26 ENCOUNTER — Ambulatory Visit
Admission: RE | Admit: 2020-02-26 | Discharge: 2020-02-26 | Disposition: A | Discharge status: Home / Self Care | Payer: Medicare Other | Attending: Neurosurgery | Admitting: Neurosurgery

## 2020-02-26 ENCOUNTER — Ambulatory Visit: Payer: Medicare Other | Admitting: Anesthesiology

## 2020-02-26 ENCOUNTER — Ambulatory Visit: Payer: Medicare Other

## 2020-02-26 ENCOUNTER — Other Ambulatory Visit: Payer: Self-pay

## 2020-02-26 ENCOUNTER — Encounter: Payer: Self-pay | Admitting: Neurosurgery

## 2020-02-26 DIAGNOSIS — I5032 Chronic diastolic (congestive) heart failure: Secondary | ICD-10-CM | POA: Diagnosis not present

## 2020-02-26 DIAGNOSIS — Z7984 Long term (current) use of oral hypoglycemic drugs: Secondary | ICD-10-CM | POA: Diagnosis not present

## 2020-02-26 DIAGNOSIS — F17210 Nicotine dependence, cigarettes, uncomplicated: Secondary | ICD-10-CM | POA: Diagnosis not present

## 2020-02-26 DIAGNOSIS — E119 Type 2 diabetes mellitus without complications: Secondary | ICD-10-CM | POA: Insufficient documentation

## 2020-02-26 DIAGNOSIS — Z955 Presence of coronary angioplasty implant and graft: Secondary | ICD-10-CM | POA: Insufficient documentation

## 2020-02-26 DIAGNOSIS — E785 Hyperlipidemia, unspecified: Secondary | ICD-10-CM | POA: Diagnosis not present

## 2020-02-26 DIAGNOSIS — I209 Angina pectoris, unspecified: Secondary | ICD-10-CM | POA: Diagnosis not present

## 2020-02-26 DIAGNOSIS — G894 Chronic pain syndrome: Secondary | ICD-10-CM | POA: Diagnosis not present

## 2020-02-26 DIAGNOSIS — I110 Hypertensive heart disease with heart failure: Secondary | ICD-10-CM | POA: Diagnosis not present

## 2020-02-26 DIAGNOSIS — Z886 Allergy status to analgesic agent status: Secondary | ICD-10-CM | POA: Insufficient documentation

## 2020-02-26 DIAGNOSIS — I509 Heart failure, unspecified: Secondary | ICD-10-CM | POA: Diagnosis not present

## 2020-02-26 DIAGNOSIS — M961 Postlaminectomy syndrome, not elsewhere classified: Secondary | ICD-10-CM | POA: Diagnosis not present

## 2020-02-26 DIAGNOSIS — I714 Abdominal aortic aneurysm, without rupture: Secondary | ICD-10-CM | POA: Diagnosis not present

## 2020-02-26 DIAGNOSIS — Z7982 Long term (current) use of aspirin: Secondary | ICD-10-CM | POA: Diagnosis not present

## 2020-02-26 DIAGNOSIS — Z833 Family history of diabetes mellitus: Secondary | ICD-10-CM | POA: Insufficient documentation

## 2020-02-26 DIAGNOSIS — Z8249 Family history of ischemic heart disease and other diseases of the circulatory system: Secondary | ICD-10-CM | POA: Diagnosis not present

## 2020-02-26 DIAGNOSIS — I2510 Atherosclerotic heart disease of native coronary artery without angina pectoris: Secondary | ICD-10-CM | POA: Diagnosis not present

## 2020-02-26 DIAGNOSIS — Z79899 Other long term (current) drug therapy: Secondary | ICD-10-CM | POA: Insufficient documentation

## 2020-02-26 DIAGNOSIS — Z888 Allergy status to other drugs, medicaments and biological substances status: Secondary | ICD-10-CM | POA: Diagnosis not present

## 2020-02-26 DIAGNOSIS — E782 Mixed hyperlipidemia: Secondary | ICD-10-CM | POA: Diagnosis not present

## 2020-02-26 DIAGNOSIS — Z462 Encounter for fitting and adjustment of other devices related to nervous system and special senses: Secondary | ICD-10-CM | POA: Diagnosis not present

## 2020-02-26 DIAGNOSIS — Z419 Encounter for procedure for purposes other than remedying health state, unspecified: Secondary | ICD-10-CM

## 2020-02-26 DIAGNOSIS — I252 Old myocardial infarction: Secondary | ICD-10-CM | POA: Diagnosis not present

## 2020-02-26 HISTORY — PX: THORACIC LAMINECTOMY FOR SPINAL CORD STIMULATOR: SHX6887

## 2020-02-26 LAB — GLUCOSE, CAPILLARY
Glucose-Capillary: 157 mg/dL — ABNORMAL HIGH (ref 70–99)
Glucose-Capillary: 164 mg/dL — ABNORMAL HIGH (ref 70–99)

## 2020-02-26 LAB — ABO/RH: ABO/RH(D): O NEG

## 2020-02-26 SURGERY — THORACIC LAMINECTOMY FOR SPINAL CORD STIMULATOR
Anesthesia: General

## 2020-02-26 MED ORDER — DEXAMETHASONE SODIUM PHOSPHATE 10 MG/ML IJ SOLN
INTRAMUSCULAR | Status: DC | PRN
  Administered 2020-02-26: 8 mg via INTRAVENOUS

## 2020-02-26 MED ORDER — PROPOFOL 10 MG/ML IV BOLUS
INTRAVENOUS | Status: AC
  Filled 2020-02-26: qty 20

## 2020-02-26 MED ORDER — BUPIVACAINE-EPINEPHRINE (PF) 0.5% -1:200000 IJ SOLN
INTRAMUSCULAR | Status: DC | PRN
  Administered 2020-02-26: 10 mL

## 2020-02-26 MED ORDER — LIDOCAINE HCL (CARDIAC) PF 100 MG/5ML IV SOSY
PREFILLED_SYRINGE | INTRAVENOUS | Status: DC | PRN
  Administered 2020-02-26: 100 mg via INTRATRACHEAL

## 2020-02-26 MED ORDER — SODIUM CHLORIDE (PF) 0.9 % IJ SOLN
INTRAMUSCULAR | Status: AC
  Filled 2020-02-26: qty 20

## 2020-02-26 MED ORDER — THROMBIN 5000 UNITS EX SOLR
CUTANEOUS | Status: DC | PRN
  Administered 2020-02-26: 5000 [IU] via TOPICAL

## 2020-02-26 MED ORDER — DEXAMETHASONE SODIUM PHOSPHATE 10 MG/ML IJ SOLN
INTRAMUSCULAR | Status: AC
  Filled 2020-02-26: qty 1

## 2020-02-26 MED ORDER — CEFAZOLIN SODIUM-DEXTROSE 2-4 GM/100ML-% IV SOLN
2.0000 g | INTRAVENOUS | Status: AC
  Administered 2020-02-26: 2 g via INTRAVENOUS

## 2020-02-26 MED ORDER — ONDANSETRON HCL 4 MG/2ML IJ SOLN
INTRAMUSCULAR | Status: DC | PRN
  Administered 2020-02-26: 4 mg via INTRAVENOUS

## 2020-02-26 MED ORDER — PROPOFOL 10 MG/ML IV BOLUS
INTRAVENOUS | Status: DC | PRN
  Administered 2020-02-26: 50 ug via INTRAVENOUS
  Administered 2020-02-26: 180 mg via INTRAVENOUS
  Administered 2020-02-26: 100 ug/kg/min via INTRAVENOUS
  Administered 2020-02-26: 20 ug via INTRAVENOUS

## 2020-02-26 MED ORDER — ORAL CARE MOUTH RINSE: 15.0000 mL | Freq: Once | OROMUCOSAL | Status: AC

## 2020-02-26 MED ORDER — REMIFENTANIL HCL 1 MG IV SOLR
INTRAVENOUS | Status: DC | PRN
  Administered 2020-02-26: .08 ug/kg/min via INTRAVENOUS

## 2020-02-26 MED ORDER — FENTANYL CITRATE (PF) 100 MCG/2ML IJ SOLN: 25.0000 ug | INTRAMUSCULAR | Status: DC | PRN

## 2020-02-26 MED ORDER — SODIUM CHLORIDE 0.9 % IV SOLN: INTRAVENOUS | Status: DC

## 2020-02-26 MED ORDER — FENTANYL CITRATE (PF) 100 MCG/2ML IJ SOLN
INTRAMUSCULAR | Status: AC
  Filled 2020-02-26: qty 2

## 2020-02-26 MED ORDER — OXYCODONE HCL 5 MG/5ML PO SOLN
5.0000 mg | Freq: Once | ORAL | Status: DC | PRN
Start: 2020-02-26 — End: 2020-02-26

## 2020-02-26 MED ORDER — CEFAZOLIN SODIUM-DEXTROSE 2-4 GM/100ML-% IV SOLN
INTRAVENOUS | Status: AC
  Filled 2020-02-26: qty 100

## 2020-02-26 MED ORDER — FENTANYL CITRATE (PF) 100 MCG/2ML IJ SOLN
INTRAMUSCULAR | Status: DC | PRN
  Administered 2020-02-26: 100 ug via INTRAVENOUS

## 2020-02-26 MED ORDER — PHENYLEPHRINE HCL (PRESSORS) 10 MG/ML IV SOLN
INTRAVENOUS | Status: AC
  Filled 2020-02-26: qty 1

## 2020-02-26 MED ORDER — ONDANSETRON HCL 4 MG/2ML IJ SOLN
INTRAMUSCULAR | Status: AC
  Filled 2020-02-26: qty 2

## 2020-02-26 MED ORDER — ONDANSETRON HCL 4 MG/2ML IJ SOLN: 4.0000 mg | Freq: Once | INTRAMUSCULAR | Status: DC | PRN

## 2020-02-26 MED ORDER — SUCCINYLCHOLINE CHLORIDE 200 MG/10ML IV SOSY
PREFILLED_SYRINGE | INTRAVENOUS | Status: AC
  Filled 2020-02-26: qty 10

## 2020-02-26 MED ORDER — MIDAZOLAM HCL 2 MG/2ML IJ SOLN
INTRAMUSCULAR | Status: AC
  Filled 2020-02-26: qty 2

## 2020-02-26 MED ORDER — PROPOFOL 500 MG/50ML IV EMUL
INTRAVENOUS | Status: AC
  Filled 2020-02-26: qty 50

## 2020-02-26 MED ORDER — VANCOMYCIN HCL 1000 MG IV SOLR
INTRAVENOUS | Status: DC | PRN
  Administered 2020-02-26: 1000 mg via TOPICAL

## 2020-02-26 MED ORDER — CHLORHEXIDINE GLUCONATE 0.12 % MT SOLN: 15.0000 mL | Freq: Once | OROMUCOSAL | Status: AC

## 2020-02-26 MED ORDER — OXYCODONE HCL 5 MG PO TABS: 5.0000 mg | ORAL_TABLET | Freq: Once | ORAL | Status: DC | PRN

## 2020-02-26 MED ORDER — CEPHALEXIN 500 MG PO CAPS: 500.0000 mg | ORAL_CAPSULE | Freq: Four times a day (QID) | ORAL | 0 refills | Status: DC

## 2020-02-26 MED ORDER — TRAMADOL HCL 50 MG PO TABS: 50.0000 mg | ORAL_TABLET | Freq: Four times a day (QID) | ORAL | 0 refills | Status: DC | PRN

## 2020-02-26 MED ORDER — REMIFENTANIL HCL 1 MG IV SOLR
INTRAVENOUS | Status: AC
  Filled 2020-02-26: qty 1000

## 2020-02-26 MED ORDER — MIDAZOLAM HCL 2 MG/2ML IJ SOLN
INTRAMUSCULAR | Status: DC | PRN
  Administered 2020-02-26: 2 mg via INTRAVENOUS

## 2020-02-26 MED ORDER — CHLORHEXIDINE GLUCONATE 0.12 % MT SOLN
OROMUCOSAL | Status: AC
  Administered 2020-02-26: 15 mL via OROMUCOSAL
  Filled 2020-02-26: qty 15

## 2020-02-26 SURGICAL SUPPLY — 78 items
ADH SKN CLS APL DERMABOND .7 (GAUZE/BANDAGES/DRESSINGS) ×1
AGENT HMST MTR 8 SURGIFLO (HEMOSTASIS)
APL PRP STRL LF DISP 70% ISPRP (MISCELLANEOUS) ×2
APL SRG 60D 8 XTD TIP BNDBL (TIP)
BLADE BOVIE TIP EXT 4 (BLADE) ×2 IMPLANT
BUR NEURO DRILL SOFT 3.0X3.8M (BURR) ×1 IMPLANT
CHLORAPREP W/TINT 26 (MISCELLANEOUS) ×3 IMPLANT
CNTNR SPEC 2.5X3XGRAD LEK (MISCELLANEOUS) ×2
CONT SPEC 4OZ STER OR WHT (MISCELLANEOUS) ×2
CONT SPEC 4OZ STRL OR WHT (MISCELLANEOUS) ×2
CONTAINER SPEC 2.5X3XGRAD LEK (MISCELLANEOUS) ×2 IMPLANT
COUNTER NEEDLE 20/40 LG (NEEDLE) ×2 IMPLANT
COVER LIGHT HANDLE STERIS (MISCELLANEOUS) ×4 IMPLANT
COVER WAND RF STERILE (DRAPES) ×2 IMPLANT
CUP MEDICINE 2OZ PLAST GRAD ST (MISCELLANEOUS) ×2 IMPLANT
DERMABOND ADVANCED (GAUZE/BANDAGES/DRESSINGS) ×1
DERMABOND ADVANCED .7 DNX12 (GAUZE/BANDAGES/DRESSINGS) ×1 IMPLANT
DRAPE C-ARM 42X72 X-RAY (DRAPES) ×4 IMPLANT
DRAPE C-ARMOR (DRAPES) ×1 IMPLANT
DRAPE LAPAROTOMY 100X77 ABD (DRAPES) ×2 IMPLANT
DRAPE SURG 17X11 SM STRL (DRAPES) ×2 IMPLANT
DRSG TEGADERM 2-3/8X2-3/4 SM (GAUZE/BANDAGES/DRESSINGS) ×1 IMPLANT
DRSG TEGADERM 4X4.75 (GAUZE/BANDAGES/DRESSINGS) ×3 IMPLANT
DURASEAL APPLICATOR TIP (TIP) IMPLANT
DURASEAL SPINE SEALANT 3ML (MISCELLANEOUS) IMPLANT
ELECT CAUTERY BLADE TIP 2.5 (TIP) ×2
ELECT EZSTD 165MM 6.5IN (MISCELLANEOUS) ×2
ELECT REM PT RETURN 9FT ADLT (ELECTROSURGICAL) ×2
ELECTRODE CAUTERY BLDE TIP 2.5 (TIP) ×1 IMPLANT
ELECTRODE EZSTD 165MM 6.5IN (MISCELLANEOUS) ×1 IMPLANT
ELECTRODE REM PT RTRN 9FT ADLT (ELECTROSURGICAL) ×1 IMPLANT
FEE INTRAOP MONITOR IMPULS NCS (MISCELLANEOUS) IMPLANT
GAUZE SPONGE 4X4 12PLY STRL (GAUZE/BANDAGES/DRESSINGS) ×2 IMPLANT
GLOVE SRG 8 PF TXTR STRL LF DI (GLOVE) ×1 IMPLANT
GLOVE SURG SYN 7.0 (GLOVE) IMPLANT
GLOVE SURG SYN 7.0 PF PI (GLOVE) ×2 IMPLANT
GLOVE SURG SYN 8.0 (GLOVE) ×2 IMPLANT
GLOVE SURG SYN 8.0 PF PI (GLOVE) ×1 IMPLANT
GLOVE SURG UNDER POLY LF SZ7 (GLOVE) IMPLANT
GLOVE SURG UNDER POLY LF SZ8 (GLOVE) ×2
GOWN STRL REUS W/ TWL LRG LVL3 (GOWN DISPOSABLE) ×1 IMPLANT
GOWN STRL REUS W/ TWL XL LVL3 (GOWN DISPOSABLE) ×2 IMPLANT
GOWN STRL REUS W/TWL LRG LVL3 (GOWN DISPOSABLE) ×2
GOWN STRL REUS W/TWL XL LVL3 (GOWN DISPOSABLE) ×4
GRADUATE 1200CC STRL 31836 (MISCELLANEOUS) ×2 IMPLANT
GRAFT DURAGEN MATRIX 1WX1L (Tissue) IMPLANT
INTRAOP MONITOR FEE IMPULS NCS (MISCELLANEOUS) ×1
INTRAOP MONITOR FEE IMPULSE (MISCELLANEOUS) ×2
KIT EXT SPINAL CORD STIMULATOR (Orthopedic Implant) ×2 IMPLANT
KIT TURNOVER KIT A (KITS) ×2 IMPLANT
MANIFOLD NEPTUNE II (INSTRUMENTS) ×2 IMPLANT
MARKER SKIN DUAL TIP RULER LAB (MISCELLANEOUS) ×5 IMPLANT
NDL SAFETY ECLIPSE 18X1.5 (NEEDLE) ×1 IMPLANT
NEEDLE HYPO 18GX1.5 SHARP (NEEDLE) ×2
NEEDLE HYPO 22GX1.5 SAFETY (NEEDLE) ×2 IMPLANT
NS IRRIG 1000ML POUR BTL (IV SOLUTION) ×2 IMPLANT
PACK LAMINECTOMY NEURO (CUSTOM PROCEDURE TRAY) ×2 IMPLANT
PAD ARMBOARD 7.5X6 YLW CONV (MISCELLANEOUS) ×4 IMPLANT
SPOGE SURGIFLO 8M (HEMOSTASIS)
SPONGE GAUZE 2X2 8PLY STRL LF (GAUZE/BANDAGES/DRESSINGS) ×1 IMPLANT
SPONGE SURGIFLO 8M (HEMOSTASIS) IMPLANT
STAPLER SKIN PROX 35W (STAPLE) IMPLANT
STIMULATOR CORD SURESCAN MRI (Stimulator) ×2 IMPLANT
STIMULATOR WIRELESS 74X79X20MM (MISCELLANEOUS) ×1 IMPLANT
SUT ETHILON 3-0 FS-10 30 BLK (SUTURE) ×4
SUT POLYSORB 2-0 5X18 GS-10 (SUTURE) ×5 IMPLANT
SUT SILK 2 0 (SUTURE) ×2
SUT SILK 2 0 SH (SUTURE) ×4 IMPLANT
SUT SILK 2-0 18XBRD TIE 12 (SUTURE) IMPLANT
SUT VIC AB 0 CT1 18XCR BRD 8 (SUTURE) ×2 IMPLANT
SUT VIC AB 0 CT1 8-18 (SUTURE) ×4
SUTURE EHLN 3-0 FS-10 30 BLK (SUTURE) ×2 IMPLANT
SYR 10ML LL (SYRINGE) ×4 IMPLANT
SYR 20ML LL LF (SYRINGE) ×2 IMPLANT
SYR 30ML LL (SYRINGE) ×4 IMPLANT
SYR 3ML LL SCALE MARK (SYRINGE) ×2 IMPLANT
TOWEL OR 17X26 4PK STRL BLUE (TOWEL DISPOSABLE) ×4 IMPLANT
TUBING CONNECTING 10 (TUBING) ×2 IMPLANT

## 2020-02-26 NOTE — Progress Notes (Signed)
PHARMACY -  BRIEF ANTIBIOTIC NOTE   Pharmacy has received consult(s) for Cefazolin from an OR provider.  The patient's profile has been reviewed for ht/wt/allergies/indication/available labs.    One time order(s) placed for Cefazolin 2gm  Further antibiotics/pharmacy consults should be ordered by admitting physician if indicated.       Renda Rolls, PharmD, [facility] 02/26/2020 6:16 AM

## 2020-02-26 NOTE — H&P (Signed)
Walter Salas is an 65 y.o. male.   Chief Complaint: Left leg pain HPI: Mr. Maclellan is here for evaluation of ongoing chronic left leg pain. He additionally does have some back pain but the leg pain is much worse. He states he has had a long history of back surgery with 6 total. The current pain started last fall when he had a incident of bending forward. Left leg pain will travel down the lateral side of the leg into the foot. He did undergo surgery earlier this year for decompression but states the leg pain did not change and is remained the same. He has previously had injections were did not provide relief. He does not endorse any medication provide relief. He denies any right leg pain. However, the left leg pain is very bothersome and interfering his activity. He is here to discuss a possible spinal cord stimulator trial.    Past Medical History:  Diagnosis Date  . (HFpEF) heart failure with preserved ejection fraction ([place])    a. 2018 Echo: EF 60-65%, normal wall motion, Gr1DD, mild aortic valve calcification; b. 12/2017 Echo: EF 60-65%, no rwma, Ao root 4.3cm, Asc Ao 4.4 cm, mildly dil LA.  Marland Kitchen Agent orange exposure   . Anginal pain ([place])   . Arthritis   . CAD (coronary artery disease)    a. s/p PCI/DES to the LCx and RCA in 2014; b. 05/2016 MV: EF 55-65%. Small, mild defect in basal anterolateral location->infarct w/o ischemia.  . Cancer ([place])    skin  . Chronic cough   . Chronic pain   . Colon polyp   . Depression   . Diabetes mellitus without complication ([place])   . Diverticulosis   . GERD (gastroesophageal reflux disease)   . History of kidney stones   . History of stomach ulcers   . HOH (hard of hearing)   . Hx of laminectomy 1999  . Hyperlipidemia   . Hypertension   . Myocardial infarction ([place])   . Sleep apnea   . Syncope    a. Event monitor 2018: NSR with first-degree AV block, no significant arrhythmias or pauses; b.  Myoview showed a small in size, mild in severity fixed  basal and anterior lateral defect, no ischemia, LVEF 55 to 65%, low risk study  . Thoracic aortic aneurysm ([place])    a. TTE 2018: Mildly dil Ao root- 4.2 cm; b. CTA aorta 6/18: Asc Ao aneurysmal dil w max diam of 4.3 cm; b. 12/2019 CTA Chest: stable 4.3cm thor Ao Aneurysm.  Marland Kitchen Ulcer     Past Surgical History:  Procedure Laterality Date  . [place]  . CARPAL TUNNEL RELEASE Bilateral 1990  . CATARACT EXTRACTION W/PHACO Left 03/08/2018   Procedure: CATARACT EXTRACTION PHACO AND INTRAOCULAR LENS PLACEMENT ([place]) LEFT;  Surgeon: Birder Robson, MD;  Location: [facility] ORS;  Service: Ophthalmology;  Laterality: Left;  [place]  00:24.2 CDE 3.25 Fluid pack lot # [id number]  . CATARACT EXTRACTION W/PHACO Right 05/24/2018   Procedure: CATARACT EXTRACTION PHACO AND INTRAOCULAR LENS PLACEMENT ([facility]) RIGHT;  Surgeon: Birder Robson, MD;  Location: [facility];  Service: Ophthalmology;  Laterality: Right;  Diabetic - oral meds  . CERVICAL FUSION  2000. 2003  . CHOLECYSTECTOMY  2000  . COLONOSCOPY WITH PROPOFOL N/A 02/23/2017   Procedure: COLONOSCOPY WITH PROPOFOL;  Surgeon: Lollie Sails, MD;  Location: [facility] ENDOSCOPY;  Service: Endoscopy;  Laterality: N/A;  . CORONARY ANGIOPLASTY WITH STENT PLACEMENT  [facility]  [facility] [place]  . ESOPHAGOGASTRODUODENOSCOPY (EGD) WITH PROPOFOL N/A 02/23/2017   Procedure: ESOPHAGOGASTRODUODENOSCOPY (EGD) WITH PROPOFOL;  Surgeon: Lollie Sails, MD;  Location: [facility] ENDOSCOPY;  Service: Endoscopy;  Laterality: N/A;  . SHOULDER ARTHROSCOPY Right 2012  . TUMOR EXCISION  2007    Family History  Problem Relation Age of Onset  . Diabetes Mother   . Heart disease Father   . Diabetes Father   . Diabetes Sister   . Heart disease Sister   . Diabetes Brother   . Heart disease Brother   . Heart disease Paternal Grandfather   . Cancer Neg Hx    Social History:  reports that he has been smoking cigarettes. He has a  50.00 pack-year smoking history. He has never used smokeless tobacco. He reports that he does not drink alcohol and does not use drugs.  Allergies:  Allergies  Allergen Reactions  . Gabapentin Other (See Comments)    "makes me crazy" MENTAL STATUS CHANGES   . Isosorbide Mononitrate Er [Isosorbide Dinitrate] Other (See Comments)    Headache  . Tylenol [Acetaminophen] Other (See Comments)    GI upset    Medications Prior to Admission  Medication Sig Dispense Refill  . amLODipine (NORVASC) 2.5 MG tablet Take 1 tablet (2.5 mg total) by mouth daily. 90 tablet 1  . aspirin 81 MG tablet Take 81 mg by mouth daily.    Marland Kitchen atorvastatin (LIPITOR) 80 MG tablet Take 80 mg by mouth at bedtime.    . chlorthalidone (HYGROTON) 50 MG tablet Take 25 mg by mouth daily.    . cholecalciferol (VITAMIN D) 1000 UNITS tablet Take 1 tablet (1,000 Units total) by mouth QID. (Patient taking differently: Take 4,000 Units by mouth daily. 4000 IU daily) 90 tablet 0  . cyclobenzaprine (FLEXERIL) 10 MG tablet Take 10 mg by mouth 2 (two) times daily.     . DULoxetine (CYMBALTA) 20 MG capsule Take 20 mg by mouth daily.    Marland Kitchen glucose blood test strip Check once daily, E11.9 100 each 12  . lidocaine (LIDODERM) 5 % 1 patch daily as needed-Remove & Discard patch within 12 hours or as directed by MD    . losartan (COZAAR) 25 MG tablet Take 25 mg by mouth daily.    . metFORMIN (GLUCOPHAGE) 1000 MG tablet Take 1,000 mg by mouth 2 (two) times daily with a meal.    . methocarbamol (ROBAXIN) 500 MG tablet Take 500 mg by mouth 2 (two) times daily as needed for muscle spasms.     . methyl salicylate liquid Apply 1 application topically as needed for muscle pain.    . nitroGLYCERIN (NITROSTAT) 0.4 MG SL tablet Place 1 tablet (0.4 mg total) under the tongue every 5 (five) minutes as needed for chest pain. May take up to 3 doses. 30 tablet 0  . omeprazole (PRILOSEC) 20 MG capsule Take 20 mg by mouth daily.    Marland Kitchen OZEMPIC, 1 MG/DOSE, 4  MG/3ML SOPN INJECT 0.75 ML (1 MG) INTO THE SKIN ONCEA WEEK 9 mL 1  . polyvinyl alcohol (LIQUIFILM TEARS) 1.4 % ophthalmic solution Place 1 drop into both eyes as needed for dry eyes.     . potassium chloride SA (K-DUR,KLOR-CON) 20 MEQ tablet Take 1 tablet (20 mEq total) by mouth daily. 90 tablet 3  . pregabalin (LYRICA) 50 MG capsule Take 1 capsule (50 mg total) by mouth 3 (three) times daily. 30 capsule 0  . tamsulosin (FLOMAX) 0.4  MG CAPS capsule TAKE 1 CAPSULE BY MOUTH ONCE DAILY 30 capsule 3  . traMADol (ULTRAM) 50 MG tablet TAKE 2 TABLETS BY MOUTH EVERY 8 HOURS ASNEEDED FOR MODERATE PAIN 90 tablet 0  . VASCEPA 1 g capsule TAKE 2 CAPSULES BY MOUTH TWICE DAILY 120 capsule 3  . vitamin B-12 (CYANOCOBALAMIN) 1000 MCG tablet Take 1 tablet (1,000 mcg total) by mouth daily. 30 tablet 0  . fenofibrate (TRICOR) 145 MG tablet Take 1 tablet (145 mg total) by mouth daily. 30 tablet 0    Results for orders placed or performed during the hospital encounter of 02/26/20 (from the past 48 hour(s))  Glucose, capillary     Status: Abnormal   Collection Time: 02/26/20  6:15 AM  Result Value Ref Range   Glucose-Capillary 157 (H) 70 - 99 mg/dL    Comment: Glucose reference range applies only to samples taken after fasting for at least 8 hours.   No results found.  Review of Systems General ROS: Negative Psychological ROS: Negative Ophthalmic ROS: Negative ENT ROS: Negative Hematological and Lymphatic ROS: Negative  Endocrine ROS: Negative Respiratory ROS: Negative Cardiovascular ROS: Negative Gastrointestinal ROS: Negative Genito-Urinary ROS: Negative Musculoskeletal ROS: Positive for back pain Neurological ROS: Positive for left leg pain Dermatological ROS: Negative   Blood pressure 129/74, pulse 71, temperature 98.1 F (36.7 C), temperature source Oral, resp. rate 18, height 5\' 10"  (1.778 m), weight 98.9 kg, SpO2 98 %. Physical Exam  General appearance: Alert, cooperative, in no acute  distress Head: Normocephalic, atraumatic Eyes: Normal, EOM intact Oropharynx: Wearing facemask CV: Regular rate and rhythm Pulm: Clear to auscultation Back: Well-healed midline lumbar incision Ext: No edema in LE bilaterally  Neurologic exam:  Mental status: alertness: alert, affect: normal Speech: fluent and clear Motor:strength symmetric 5/5 in bilateral lower extremities Sensory: intact to light touch in bilateral lower extremities Gait: normal   Imaging: MRI lumbar spine: There is evidence of previous decompression L3-5. There is some residual stenosis on the left at L3-4. There is some additional mild granulation tissue at L5-S1. There is no obvious central stenosis. There is facet arthropathy noted throughout  MRI thoracic spine: There is some stenosis noted at T11-12 which is mild to moderate, eccentric to the left. There is no central stenosis noted above this.   Assessment/Plan Proceed with thoracic SCS trial via laminectomy  Deetta Perla, MD 02/26/2020, 6:37 AM

## 2020-02-26 NOTE — Discharge Instructions (Addendum)
NEUROSURGERY DISCHARGE INSTRUCTIONS  Admission diagnosis: G89.4 CHRONIC PAIN SYNDROME M96.1 FAILED BACK SURGICAL SYNDROME  Operative procedure: Thoracic Spinal Cord Stimulator Trial  What to do after you leave the hospital:  Recommended diet: diabetic diet. Increase protein intake to promote wound healing.  Recommended activity: no lifting, driving, or strenuous exercise for 6 weeks. Walking is good. Avoid bending at the waist  Special Instructions  No straining, no heavy lifting > 10lbs x 2 weeks.  Keep incision area clean and dry.  No bath, shower, or soaking in tub until return next week for surgery. You may wash areas of body not near battery.  May take Tramadol as needed for pain, please take stool softener with this  Please Report any of the following: Nausea or Vomiting, Temperature is greater than 101.15F (38.1C) degrees, Dizziness, Abdominal Pain, Difficulty Breathing or Shortness of Breath, Inability to Eat, drink Fluids, or Take medications, Bleeding, swelling, or drainage from surgical incision sites, New numbness or weakness and Bowel or bladder dysfunction to the neurosurgeon on call at [phone]  Additional Follow up appointments Surgery is planned for 03/04/2020   Please see below for scheduled appointments:  Future Appointments  Date Time Provider [place]  02/29/2020  8:30 AM [facility] None  04/03/2020 11:30 AM [facility] None  04/10/2020 10:00 AM Earlie Server, MD [facility] None  07/04/2020  8:00 AM End, Harrell Gave, MD [facility]  07/17/2020  8:00 AM Leone Haven, MD [facility]  01/22/2021  9:30 AM O'Brien-Blaney, Denisa L, LPN [facility]     AMBULATORY SURGERY  DISCHARGE INSTRUCTIONS   1) The drugs that you were given will stay in your system until tomorrow so for the next 24 hours you should not:  A) Drive an automobile B) Make any legal decisions C) Drink any alcoholic beverage   2) You may  resume regular meals tomorrow.  Today it is better to start with liquids and gradually work up to solid foods.  You may eat anything you prefer, but it is better to start with liquids, then soup and crackers, and gradually work up to solid foods.   3) Please notify your doctor immediately if you have any unusual bleeding, trouble breathing, redness and pain at the surgery site, drainage, fever, or pain not relieved by medication.    4) Additional Instructions:        Please contact your physician with any problems or Same Day Surgery at [phone], Monday through Friday 6 am to 4 pm, or [place] at [facility] number at [phone].

## 2020-02-26 NOTE — Anesthesia Preprocedure Evaluation (Addendum)
Anesthesia Evaluation  Patient identified by MRN, date of birth, ID band Patient awake    Reviewed: Allergy & Precautions, NPO status , Patient's Chart, lab work & pertinent test results  History of Anesthesia Complications Negative for: history of anesthetic complications  Airway Mallampati: II  TM Distance: >3 FB Neck ROM: Full    Dental no notable dental hx. (+) Missing, Poor Dentition,    Pulmonary sleep apnea , neg COPD, Current Smoker and Patient abstained from smoking.,    Pulmonary exam normal breath sounds clear to auscultation       Cardiovascular Exercise Tolerance: Good METShypertension, Pt. on medications + angina + CAD, + Past MI and +CHF  (-) dysrhythmias  Rhythm:Regular Rate:Normal - Systolic murmurs TTE - Left ventricle: The cavity size was normal. There was mild  concentric hypertrophy. Systolic function was normal. The  estimated ejection fraction was in the range of 60% to 65%. Wall  motion was normal; there were no regional wall motion  abnormalities.  - Aorta: Aortic root dimension: 43 mm (ED). Ascending aortic  diameter: 44 mm (S).  - Ascending aorta: The ascending aorta was moderately dilated.  - Left atrium: The atrium was mildly dilated.  - Pulmonary arteries: Systolic pressure could not be accurately  estimated.  - Pericardium, extracardiac: A trivial pericardial effusion was  identified.    Neuro/Psych PSYCHIATRIC DISORDERS Depression  Neuromuscular disease    GI/Hepatic GERD  Medicated,(+)     (-) substance abuse  ,   Endo/Other  diabetes  Renal/GU negative Renal ROS     Musculoskeletal  (+) Arthritis ,   Abdominal   Peds  Hematology   Anesthesia Other Findings Past Medical History: No date: (HFpEF) heart failure with preserved ejection fraction ([place])     Comment:  a. 2018 Echo: EF 60-65%, normal wall motion, Gr1DD, mild              aortic valve calcification;  b. 12/2017 Echo: EF 60-65%,               no rwma, Ao root 4.3cm, Asc Ao 4.4 cm, mildly dil LA. No date: Agent orange exposure No date: Anginal pain ([place]) No date: Arthritis No date: CAD (coronary artery disease)     Comment:  a. s/p PCI/DES to the LCx and RCA in 2014; b. 05/2016 MV:              EF 55-65%. Small, mild defect in basal anterolateral               location->infarct w/o ischemia. No date: Cancer [facility])     Comment:  skin No date: Chronic cough No date: Chronic pain No date: Colon polyp No date: Depression No date: Diabetes mellitus without complication ([facility]) No date: Diverticulosis No date: GERD (gastroesophageal reflux disease) No date: History of kidney stones No date: History of stomach ulcers No date: HOH (hard of hearing) 1999: Hx of laminectomy No date: Hyperlipidemia No date: Hypertension No date: Myocardial infarction ([place]) No date: Sleep apnea No date: Syncope     Comment:  a. Event monitor 2018: NSR with first-degree AV block,               no significant arrhythmias or pauses; b.  Myoview showed               a small in size, mild in severity fixed basal and               anterior lateral  defect, no ischemia, LVEF 55 to 65%, low              risk study No date: Thoracic aortic aneurysm ([facility])     Comment:  a. TTE 2018: Mildly dil Ao root- 4.2 cm; b. CTA aorta               6/18: Asc Ao aneurysmal dil w max diam of 4.3 cm; b.               12/2019 CTA Chest: stable 4.3cm thor Ao Aneurysm. No date: Ulcer  Reproductive/Obstetrics                            Anesthesia Physical Anesthesia Plan  ASA: III  Anesthesia Plan: General   Post-op Pain Management:    Induction: Intravenous  PONV Risk Score and Plan: 2 and Ondansetron, Dexamethasone, Propofol infusion, TIVA and Treatment may vary due to age or medical condition  Airway Management Planned: Oral ETT  Additional Equipment: None  Intra-op Plan:   Post-operative Plan:  Extubation in OR  Informed Consent: I have reviewed the patients History and Physical, chart, labs and discussed the procedure including the risks, benefits and alternatives for the proposed anesthesia with the patient or authorized representative who has indicated his/her understanding and acceptance.     Dental advisory given  Plan Discussed with: CRNA and Surgeon  Anesthesia Plan Comments: (Discussed risks of anesthesia with patient, including PONV, sore throat, lip/dental damage. Rare risks discussed as well, such as cardiorespiratory and neurological sequelae. Patient understands. Patient counseled on benefits of smoking cessation, and increased perioperative risks associated with continued smoking. )        Anesthesia Quick Evaluation

## 2020-02-26 NOTE — Interval H&P Note (Signed)
History and Physical Interval Note:  02/26/2020 6:39 AM  Walter Salas  has presented today for surgery, with the diagnosis of G89.4 CHRONIC PAIN SYNDROME M96.1 FAILED BACK SURGICAL SYNDROME.  The various methods of treatment have been discussed with the patient and family. After consideration of risks, benefits and other options for treatment, the patient has consented to  Procedure(s) with comments: THORACIC LAMINECTOMY FOR SPINAL CORD STIMULATOR PADDLE TRIAL (N/A) - 1ST CASE as a surgical intervention.  The patient's history has been reviewed, patient examined, no change in status, stable for surgery.  I have reviewed the patient's chart and labs.  Questions were answered to the patient's satisfaction.     Deetta Perla

## 2020-02-26 NOTE — Anesthesia Postprocedure Evaluation (Signed)
Anesthesia Post Note  Patient: Walter Salas  Procedure(s) Performed: THORACIC LAMINECTOMY FOR SPINAL CORD STIMULATOR PADDLE TRIAL (N/A )  Patient location during evaluation: PACU Anesthesia Type: General Level of consciousness: awake and alert Pain management: pain level controlled Vital Signs Assessment: post-procedure vital signs reviewed and stable Respiratory status: spontaneous breathing, nonlabored ventilation, respiratory function stable and patient connected to nasal cannula oxygen Cardiovascular status: blood pressure returned to baseline and stable Postop Assessment: no apparent nausea or vomiting Anesthetic complications: no   No complications documented.   Last Vitals:  Vitals:   02/26/20 1019 02/26/20 1033  BP: 124/64 127/61  Pulse: 73 73  Resp: (!) 9 16  Temp: 36.8 C (!) 36.3 C  SpO2: 97% 97%    Last Pain:  Vitals:   02/26/20 1033  TempSrc: Temporal  PainSc: 0-No pain                 Arita Miss

## 2020-02-26 NOTE — Anesthesia Procedure Notes (Signed)
Procedure Name: Intubation Date/Time: 02/26/2020 7:29 AM Performed by: Rona Ravens, CRNA Pre-anesthesia Checklist: Patient identified, Emergency Drugs available, Suction available, Patient being monitored and Timeout performed Patient Re-evaluated:Patient Re-evaluated prior to induction Oxygen Delivery Method: Circle system utilized Preoxygenation: Pre-oxygenation with 100% oxygen Induction Type: IV induction Ventilation: Mask ventilation without difficulty Laryngoscope Size: Mac and 4 Grade View: Grade II Tube size: 7.5 mm Number of attempts: 1 Airway Equipment and Method: Stylet Placement Confirmation: ETT inserted through vocal cords under direct vision,  positive ETCO2 and breath sounds checked- equal and bilateral Secured at: 22 cm Tube secured with: Tape Dental Injury: Teeth and Oropharynx as per pre-operative assessment

## 2020-02-26 NOTE — Progress Notes (Signed)
Nerve stimulator rep at bedside for post-op teaching as well.

## 2020-02-26 NOTE — Op Note (Signed)
Operative Note   SURGERY DATE:  02/26/2020   PRE-OP DIAGNOSIS:  Chronic pain syndrome   POST-OP DIAGNOSIS: Post-Op Diagnosis Codes: Chronic pain syndrome   Procedure(s) with comments: Thoracic Laminectomy with SCS Paddle Placement for Trial  SURGEON:     Malen Gauze, MD       ANESTHESIA: General    OPERATIVE FINDINGS: Successful placement of thoracic spinal cord stimulator   Indication Mr Walter Salas was seen in clinic on 10/12 for ongoing back and left leg pain after multiple prior lumbar surgeries. MRI of the spine revealed stenosis at lower thoracic area and therefore a paddle trial was recommended. He had psychological clearance. The patient wished to proceed to permanent implant to decrease medication use and achieve better pain control. Risks including damage to spinal cord, weakness, hematoma, infection, failure of pain relief, post-operative pain, need for revision, stroke, heart attack, pneumonia, and spinal cord injury were discussed.    Procedure The patient was brought to the operating room where vascular access was obtained and intubated by the anesthesia service. Neuromonitoring electrodes were placed for SSEP, MEP, and EMG with good baselines obtained. Antibiotics were given.  The patient was turned prone onto a Wilson frame. Fluoroscopy was used to confirm planned incision in thoracic area. A right flank incision was planned The patient was prepped and draped in a sterile fashion. A hard time out was performed. Local anesthetic was instilled into incisions.   The thoracic incision was opened sharply over the midline and continued through the fascia to the level of the T9/10 lamina. The lamina above was exposed and fluoroscopy was used to confirm the T9/10 interspace. Next, a combination of rongeurs and drill were used to remove the caudal half of the T9 lamina along with underlying ligament to expose the dura. The opening was widened to allow for paddle placement. The  Medtronic paddle was inserted gently in the rostral direction placing it in the midline of the exposure. Fluoroscopy was used to confirm midline placement and levels with the tip of the paddle resting at the top of T8 vertebral body. Monitoring confirmed that we were getting adequate coverage in left leg given the distribution of the patient's pain. The leads were then secured to the muscle with a strain relief using silk suture.   The right flank incision was opened sharply down to level of fascia and then expanded to allow for placement of a possible battery. The leads were then tunneled from the thoracic incision to the flank. There, the leads were connected to extensions. The extensions were then tunneled to the left flank exiting the skin. Hemostasis was achieved. Each incision was irrigated with antibiotic saline and vancomycin powder placed. The wires were connected top an external battery and placed in adhesive pack. The exiting wires were sutured in place.   A final fluoroscopic image was taken show good placement of paddle. Then, each incision was closed with combination of 0 and 2-0 vicryls. The skin was closed with 3-0 Nylon in the thoracic area and flank. Sterile dressings were applied. The patient was returned to supine position and extubated. The patient was seen to be moving all extremities symmetrically and was taken to PACU for recovery. The family was updated and all questions answered.   ESTIMATED BLOOD LOSS:   50 cc   SPECIMENS None   IMPLANT STIMULATOR CORD SURESCAN MRI - [id number]  Inventory Item: STIMULATOR CORD SURESCAN MRI Serial no.:  Model/Cat no.: [id number]  Implant name: [place]  MRI - [id number] Laterality: N/A Area: Spine Thoracic  Manufacturer: MEDTRONIC NEUROMOD PAIN MGMT Date of Manufacture:    Action: Implanted Number Used: 1   Device Identifier:  Device Identifier Type:     KIT EXT SPINAL CORD STIMULATOR - [id number]  Inventory Item: KIT EXT  SPINAL CORD STIMULATOR Serial no.: [id number] Model/Cat no.: [id number]  Implant name: KIT EXT SPINAL CORD STIMULATOR - [id number] Laterality: N/A Area: Spine Thoracic  Manufacturer: MEDTRONIC NEUROMOD PAIN MGMT Date of Manufacture:    Action: Implanted Number Used: 1   Device Identifier:  Device Identifier Type:     KIT EXT SPINAL CORD STIMULATOR - [id number]  Inventory Item: KIT EXT SPINAL CORD STIMULATOR Serial no.: [id number] Model/Cat no.: [id number]  Implant name: KIT EXT SPINAL CORD STIMULATOR - [id number] Laterality: N/A Area: Spine Thoracic  Manufacturer: MEDTRONIC NEUROMOD PAIN MGMT Date of Manufacture:    Action: Implanted Number Used: 1   Device Identifier:  Device Identifier Type:          I performed the case in its entirety   Deetta Perla, [place]

## 2020-02-26 NOTE — H&P (View-Only) (Signed)
Walter Salas is an 65 y.o. male.   Chief Complaint: Left leg pain HPI: Walter Salas is here for evaluation of ongoing chronic left leg pain. He additionally does have some back pain but the leg pain is much worse. He states he has had a long history of back surgery with 6 total. The current pain started last fall when he had a incident of bending forward. Left leg pain will travel down the lateral side of the leg into the foot. He did undergo surgery earlier this year for decompression but states the leg pain did not change and is remained the same. He has previously had injections were did not provide relief. He does not endorse any medication provide relief. He denies any right leg pain. However, the left leg pain is very bothersome and interfering his activity. He is here to discuss a possible spinal cord stimulator trial.    Past Medical History:  Diagnosis Date   (HFpEF) heart failure with preserved ejection fraction ([place])    a. 2018 Echo: EF 60-65%, normal wall motion, Gr1DD, mild aortic valve calcification; b. 12/2017 Echo: EF 60-65%, no rwma, Ao root 4.3cm, Asc Ao 4.4 cm, mildly dil LA.   Agent orange exposure    Anginal pain ([place])    Arthritis    CAD (coronary artery disease)    a. s/p PCI/DES to the LCx and RCA in 2014; b. 05/2016 MV: EF 55-65%. Small, mild defect in basal anterolateral location->infarct w/o ischemia.   Cancer ([place])    skin   Chronic cough    Chronic pain    Colon polyp    Depression    Diabetes mellitus without complication (HCC)    Diverticulosis    GERD (gastroesophageal reflux disease)    History of kidney stones    History of stomach ulcers    HOH (hard of hearing)    Hx of laminectomy 1999   Hyperlipidemia    Hypertension    Myocardial infarction [facility])    Sleep apnea    Syncope    a. Event monitor 2018: NSR with first-degree AV block, no significant arrhythmias or pauses; b.  Myoview showed a small in size, mild in severity fixed  basal and anterior lateral defect, no ischemia, LVEF 55 to 65%, low risk study   Thoracic aortic aneurysm ([place])    a. TTE 2018: Mildly dil Ao root- 4.2 cm; b. CTA aorta 6/18: Asc Ao aneurysmal dil w max diam of 4.3 cm; b. 12/2019 CTA Chest: stable 4.3cm thor Ao Aneurysm.   Ulcer     Past Surgical History:  Procedure Laterality Date   [place] Bilateral 1990   CATARACT EXTRACTION W/PHACO Left 03/08/2018   Procedure: CATARACT EXTRACTION PHACO AND INTRAOCULAR LENS PLACEMENT ([place]) LEFT;  Surgeon: Birder Robson, MD;  Location: [facility] ORS;  Service: Ophthalmology;  Laterality: Left;  [place]  00:24.2 CDE 3.25 Fluid pack lot # [id number]   CATARACT EXTRACTION W/PHACO Right 05/24/2018   Procedure: CATARACT EXTRACTION PHACO AND INTRAOCULAR LENS PLACEMENT ([facility]) RIGHT;  Surgeon: Birder Robson, MD;  Location: [place];  Service: Ophthalmology;  Laterality: Right;  Diabetic - oral meds   CERVICAL FUSION  2000. 2003   CHOLECYSTECTOMY  2000   COLONOSCOPY WITH PROPOFOL N/A 02/23/2017   Procedure: COLONOSCOPY WITH PROPOFOL;  Surgeon: Lollie Sails, MD;  Location: [facility] ENDOSCOPY;  Service: Endoscopy;  Laterality: N/A;   CORONARY ANGIOPLASTY WITH STENT PLACEMENT  [facility]  [facility] [place]   ESOPHAGOGASTRODUODENOSCOPY (EGD) WITH PROPOFOL N/A 02/23/2017   Procedure: ESOPHAGOGASTRODUODENOSCOPY (EGD) WITH PROPOFOL;  Surgeon: Lollie Sails, MD;  Location: [facility] ENDOSCOPY;  Service: Endoscopy;  Laterality: N/A;   SHOULDER ARTHROSCOPY Right 2012   TUMOR EXCISION  2007    Family History  Problem Relation Age of Onset   Diabetes Mother    Heart disease Father    Diabetes Father    Diabetes Sister    Heart disease Sister    Diabetes Brother    Heart disease Brother    Heart disease Paternal Grandfather    Cancer Neg Hx    Social History:  reports that he has been smoking cigarettes. He has a  50.00 pack-year smoking history. He has never used smokeless tobacco. He reports that he does not drink alcohol and does not use drugs.  Allergies:  Allergies  Allergen Reactions   Gabapentin Other (See Comments)    "makes me crazy" MENTAL STATUS CHANGES    Isosorbide Mononitrate Er [Isosorbide Dinitrate] Other (See Comments)    Headache   Tylenol [Acetaminophen] Other (See Comments)    GI upset    Medications Prior to Admission  Medication Sig Dispense Refill   amLODipine (NORVASC) 2.5 MG tablet Take 1 tablet (2.5 mg total) by mouth daily. 90 tablet 1   aspirin 81 MG tablet Take 81 mg by mouth daily.     atorvastatin (LIPITOR) 80 MG tablet Take 80 mg by mouth at bedtime.     chlorthalidone (HYGROTON) 50 MG tablet Take 25 mg by mouth daily.     cholecalciferol (VITAMIN D) 1000 UNITS tablet Take 1 tablet (1,000 Units total) by mouth QID. (Patient taking differently: Take 4,000 Units by mouth daily. 4000 IU daily) 90 tablet 0   cyclobenzaprine (FLEXERIL) 10 MG tablet Take 10 mg by mouth 2 (two) times daily.      DULoxetine (CYMBALTA) 20 MG capsule Take 20 mg by mouth daily.     glucose blood test strip Check once daily, E11.9 100 each 12   lidocaine (LIDODERM) 5 % 1 patch daily as needed-Remove & Discard patch within 12 hours or as directed by MD     losartan (COZAAR) 25 MG tablet Take 25 mg by mouth daily.     metFORMIN (GLUCOPHAGE) 1000 MG tablet Take 1,000 mg by mouth 2 (two) times daily with a meal.     methocarbamol (ROBAXIN) 500 MG tablet Take 500 mg by mouth 2 (two) times daily as needed for muscle spasms.      methyl salicylate liquid Apply 1 application topically as needed for muscle pain.     nitroGLYCERIN (NITROSTAT) 0.4 MG SL tablet Place 1 tablet (0.4 mg total) under the tongue every 5 (five) minutes as needed for chest pain. May take up to 3 doses. 30 tablet 0   omeprazole (PRILOSEC) 20 MG capsule Take 20 mg by mouth daily.     OZEMPIC, 1 MG/DOSE, 4  MG/3ML SOPN INJECT 0.75 ML (1 MG) INTO THE SKIN ONCEA WEEK 9 mL 1   polyvinyl alcohol (LIQUIFILM TEARS) 1.4 % ophthalmic solution Place 1 drop into both eyes as needed for dry eyes.      potassium chloride SA (K-DUR,KLOR-CON) 20 MEQ tablet Take 1 tablet (20 mEq total) by mouth daily. 90 tablet 3   pregabalin (LYRICA) 50 MG capsule Take 1 capsule (50 mg total) by mouth 3 (three) times daily. 30 capsule 0   tamsulosin (FLOMAX) 0.4  MG CAPS capsule TAKE 1 CAPSULE BY MOUTH ONCE DAILY 30 capsule 3   traMADol (ULTRAM) 50 MG tablet TAKE 2 TABLETS BY MOUTH EVERY 8 HOURS ASNEEDED FOR MODERATE PAIN 90 tablet 0   VASCEPA 1 g capsule TAKE 2 CAPSULES BY MOUTH TWICE DAILY 120 capsule 3   vitamin B-12 (CYANOCOBALAMIN) 1000 MCG tablet Take 1 tablet (1,000 mcg total) by mouth daily. 30 tablet 0   fenofibrate (TRICOR) 145 MG tablet Take 1 tablet (145 mg total) by mouth daily. 30 tablet 0    Results for orders placed or performed during the hospital encounter of 02/26/20 (from the past 48 hour(s))  Glucose, capillary     Status: Abnormal   Collection Time: 02/26/20  6:15 AM  Result Value Ref Range   Glucose-Capillary 157 (H) 70 - 99 mg/dL    Comment: Glucose reference range applies only to samples taken after fasting for at least 8 hours.   No results found.  Review of Systems General ROS: Negative Psychological ROS: Negative Ophthalmic ROS: Negative ENT ROS: Negative Hematological and Lymphatic ROS: Negative  Endocrine ROS: Negative Respiratory ROS: Negative Cardiovascular ROS: Negative Gastrointestinal ROS: Negative Genito-Urinary ROS: Negative Musculoskeletal ROS: Positive for back pain Neurological ROS: Positive for left leg pain Dermatological ROS: Negative   Blood pressure 129/74, pulse 71, temperature 98.1 F (36.7 C), temperature source Oral, resp. rate 18, height 5\' 10"  (1.778 m), weight 98.9 kg, SpO2 98 %. Physical Exam  General appearance: Alert, cooperative, in no acute  distress Head: Normocephalic, atraumatic Eyes: Normal, EOM intact Oropharynx: Wearing facemask CV: Regular rate and rhythm Pulm: Clear to auscultation Back: Well-healed midline lumbar incision Ext: No edema in LE bilaterally  Neurologic exam:  Mental status: alertness: alert, affect: normal Speech: fluent and clear Motor:strength symmetric 5/5 in bilateral lower extremities Sensory: intact to light touch in bilateral lower extremities Gait: normal   Imaging: MRI lumbar spine: There is evidence of previous decompression L3-5. There is some residual stenosis on the left at L3-4. There is some additional mild granulation tissue at L5-S1. There is no obvious central stenosis. There is facet arthropathy noted throughout  MRI thoracic spine: There is some stenosis noted at T11-12 which is mild to moderate, eccentric to the left. There is no central stenosis noted above this.   Assessment/Plan Proceed with thoracic SCS trial via laminectomy  Deetta Perla, MD 02/26/2020, 6:37 AM

## 2020-02-26 NOTE — Transfer of Care (Signed)
Immediate Anesthesia Transfer of Care Note  Patient: Walter Salas  Procedure(s) Performed: THORACIC LAMINECTOMY FOR SPINAL CORD STIMULATOR PADDLE TRIAL (N/A )  Patient Location: PACU  Anesthesia Type:General  Level of Consciousness: sedated  Airway & Oxygen Therapy: Patient Spontanous Breathing and Patient connected to face mask oxygen  Post-op Assessment: Report given to RN and Post -op Vital signs reviewed and stable  Post vital signs: Reviewed and stable  Last Vitals:  Vitals Value Taken Time  BP 149/73 02/26/20 0932  Temp    Pulse 87 02/26/20 0937  Resp 12 02/26/20 0937  SpO2 100 % 02/26/20 0937  Vitals shown include unvalidated device data.  Last Pain:  Vitals:   02/26/20 0615  TempSrc: Oral  PainSc: 4          Complications: No complications documented.

## 2020-02-29 ENCOUNTER — Other Ambulatory Visit: Payer: Self-pay

## 2020-02-29 ENCOUNTER — Other Ambulatory Visit
Admission: RE | Admit: 2020-02-29 | Discharge: 2020-02-29 | Disposition: A | Discharge status: Home / Self Care | Payer: Medicare Other | Source: Ambulatory Visit | Attending: Neurosurgery | Admitting: Neurosurgery

## 2020-02-29 DIAGNOSIS — Z20822 Contact with and (suspected) exposure to covid-19: Secondary | ICD-10-CM | POA: Diagnosis not present

## 2020-02-29 DIAGNOSIS — Z01812 Encounter for preprocedural laboratory examination: Secondary | ICD-10-CM | POA: Diagnosis not present

## 2020-02-29 LAB — SARS CORONAVIRUS 2 (TAT 6-24 HRS): SARS Coronavirus 2: NEGATIVE

## 2020-03-02 NOTE — Anesthesia Preprocedure Evaluation (Addendum)
Anesthesia Evaluation  Patient identified by MRN, date of birth, ID band Patient awake    Reviewed: Allergy & Precautions, H&P , NPO status , Patient's Chart, lab work & pertinent test results  History of Anesthesia Complications Negative for: history of anesthetic complications  Airway Mallampati: III  TM Distance: >3 FB     Dental  (+) Teeth Intact,    Pulmonary sleep apnea , neg COPD, Current Smoker and Patient abstained from smoking.,    breath sounds clear to auscultation       Cardiovascular hypertension, (-) angina+ CAD, + Past MI and + Cardiac Stents (2014)  (-) dysrhythmias  Rhythm:regular Rate:Normal     Neuro/Psych PSYCHIATRIC DISORDERS Depression Chronic back pain negative neurological ROS     GI/Hepatic Neg liver ROS, GERD  ,  Endo/Other  diabetes  Renal/GU      Musculoskeletal   Abdominal   Peds  Hematology negative hematology ROS (+)   Anesthesia Other Findings Past Medical History: No date: (HFpEF) heart failure with preserved ejection fraction ([place])     Comment:  a. 2018 Echo: EF 60-65%, normal wall motion, Gr1DD, mild              aortic valve calcification; b. 12/2017 Echo: EF 60-65%,               no rwma, Ao root 4.3cm, Asc Ao 4.4 cm, mildly dil LA. No date: Agent orange exposure No date: Anginal pain ([place]) No date: Arthritis No date: CAD (coronary artery disease)     Comment:  a. s/p PCI/DES to the LCx and RCA in 2014; b. 05/2016 MV:              EF 55-65%. Small, mild defect in basal anterolateral               location->infarct w/o ischemia. No date: Cancer [facility])     Comment:  skin No date: Chronic cough No date: Chronic pain No date: Colon polyp No date: Depression No date: Diabetes mellitus without complication (HCC) No date: Diverticulosis No date: GERD (gastroesophageal reflux disease) No date: History of kidney stones No date: History of stomach ulcers No date: HOH (hard of  hearing) 1999: Hx of laminectomy No date: Hyperlipidemia No date: Hypertension No date: Myocardial infarction ([place]) No date: Sleep apnea No date: Syncope     Comment:  a. Event monitor 2018: NSR with first-degree AV block,               no significant arrhythmias or pauses; b.  Myoview showed               a small in size, mild in severity fixed basal and               anterior lateral defect, no ischemia, LVEF 55 to 65%, low              risk study No date: Thoracic aortic aneurysm ([place])     Comment:  a. TTE 2018: Mildly dil Ao root- 4.2 cm; b. CTA aorta               6/18: Asc Ao aneurysmal dil w max diam of 4.3 cm; b.               12/2019 CTA Chest: stable 4.3cm thor Ao Aneurysm. No date: Ulcer  Past Surgical History: 1993, 1994, 1997, 1998: BACK SURGERY 1990: [place]; Bilateral 03/08/2018: CATARACT EXTRACTION W/PHACO; Left  Comment:  Procedure: CATARACT EXTRACTION PHACO AND INTRAOCULAR               LENS PLACEMENT ([place]) LEFT;  Surgeon: Birder Robson,               MD;  Location: [facility] ORS;  Service: Ophthalmology;                Laterality: Left;  [place]  00:24.2 CDE 3.25 Fluid pack lot               # [id number] 05/24/2018: CATARACT EXTRACTION W/PHACO; Right     Comment:  Procedure: CATARACT EXTRACTION PHACO AND INTRAOCULAR               LENS PLACEMENT ([facility]) RIGHT;  Surgeon: Birder Robson,               MD;  Location: [place];  Service:               Ophthalmology;  Laterality: Right;  Diabetic - oral meds 2000. 2003: CERVICAL FUSION 2000: CHOLECYSTECTOMY 02/23/2017: COLONOSCOPY WITH PROPOFOL; N/A     Comment:  Procedure: COLONOSCOPY WITH PROPOFOL;  Surgeon:               Lollie Sails, MD;  Location: [facility] ENDOSCOPY;                Service: Endoscopy;  Laterality: N/A; 2014: CORONARY ANGIOPLASTY WITH STENT PLACEMENT     Comment:  [facility], [facility] 02/23/2017: ESOPHAGOGASTRODUODENOSCOPY (EGD) WITH PROPOFOL;  N/A     Comment:  Procedure: ESOPHAGOGASTRODUODENOSCOPY (EGD) WITH               PROPOFOL;  Surgeon: Lollie Sails, MD;  Location:               [facility] ENDOSCOPY;  Service: Endoscopy;  Laterality: N/A; 2012: SHOULDER ARTHROSCOPY; Right 02/26/2020: THORACIC LAMINECTOMY FOR SPINAL CORD STIMULATOR; N/A     Comment:  Procedure: THORACIC LAMINECTOMY FOR SPINAL CORD               STIMULATOR PADDLE TRIAL;  Surgeon: Deetta Perla, MD;                Location: [facility] ORS;  Service: Neurosurgery;  Laterality:               N/A;  1ST CASE 2007: TUMOR EXCISION  BMI    Body Mass Index: 31.28 kg/m      Reproductive/Obstetrics negative OB ROS                            Anesthesia Physical Anesthesia Plan  ASA: III  Anesthesia Plan: General ETT   Post-op Pain Management:    Induction:   PONV Risk Score and Plan: Ondansetron, Dexamethasone, Midazolam and Treatment may vary due to age or medical condition  Airway Management Planned:   Additional Equipment:   Intra-op Plan:   Post-operative Plan:   Informed Consent: I have reviewed the patients History and Physical, chart, labs and discussed the procedure including the risks, benefits and alternatives for the proposed anesthesia with the patient or authorized representative who has indicated his/her understanding and acceptance.     Dental Advisory Given  Plan Discussed with: Anesthesiologist, CRNA and Surgeon  Anesthesia Plan Comments:         Anesthesia Quick Evaluation

## 2020-03-03 MED ORDER — ORAL CARE MOUTH RINSE: 15.0000 mL | Freq: Once | OROMUCOSAL | Status: AC

## 2020-03-03 MED ORDER — CHLORHEXIDINE GLUCONATE 0.12 % MT SOLN: 15.0000 mL | Freq: Once | OROMUCOSAL | Status: AC

## 2020-03-03 MED ORDER — SODIUM CHLORIDE 0.9 % IV SOLN: INTRAVENOUS | Status: DC

## 2020-03-03 MED ORDER — CEFAZOLIN SODIUM-DEXTROSE 2-4 GM/100ML-% IV SOLN
2.0000 g | INTRAVENOUS | Status: AC
  Administered 2020-03-04: 2 g via INTRAVENOUS

## 2020-03-03 NOTE — Progress Notes (Signed)
Pharmacy Antibiotic Note  Walter Salas is a 65 y.o. male admitted on (Not on file) with surgical prophylaxis.  Pharmacy has been consulted for Cefazolin  dosing.  Plan: TBW = 98.9 kg   Cefazolin 2 gm IV X 1 60 min pre-op ordered for 2/21.      No data recorded.  No results for input(s): WBC, CREATININE, LATICACIDVEN, VANCOTROUGH, VANCOPEAK, VANCORANDOM, GENTTROUGH, GENTPEAK, GENTRANDOM, TOBRATROUGH, TOBRAPEAK, TOBRARND, AMIKACINPEAK, AMIKACINTROU, AMIKACIN in the last 168 hours.  Estimated Creatinine Clearance: 107.1 mL/min (by C-G formula based on SCr of 0.73 mg/dL).    Allergies  Allergen Reactions  . Gabapentin Other (See Comments)    "makes me crazy" MENTAL STATUS CHANGES   . Isosorbide Mononitrate Er [Isosorbide Dinitrate] Other (See Comments)    Headache  . Tylenol [Acetaminophen] Other (See Comments)    GI upset    Antimicrobials this admission:   >>    >>   Dose adjustments this admission:   Microbiology results:  BCx:  UCx:    Sputum:    MRSA PCR:   Thank you for allowing pharmacy to be a part of this patient's care.  Lemoyne Nestor D 03/03/2020 11:37 PM

## 2020-03-04 ENCOUNTER — Ambulatory Visit: Payer: Medicare Other | Admitting: Urgent Care

## 2020-03-04 ENCOUNTER — Encounter: Payer: Self-pay | Admitting: Neurosurgery

## 2020-03-04 ENCOUNTER — Other Ambulatory Visit: Payer: Self-pay

## 2020-03-04 ENCOUNTER — Other Ambulatory Visit: Payer: Self-pay | Admitting: Family Medicine

## 2020-03-04 ENCOUNTER — Ambulatory Visit
Admission: RE | Admit: 2020-03-04 | Discharge: 2020-03-04 | Disposition: A | Discharge status: Home / Self Care | Payer: Medicare Other | Attending: Neurosurgery | Admitting: Neurosurgery

## 2020-03-04 ENCOUNTER — Encounter
Admission: RE | Disposition: A | Discharge status: Home / Self Care | Payer: Self-pay | Source: Home / Self Care | Attending: Neurosurgery

## 2020-03-04 DIAGNOSIS — Z886 Allergy status to analgesic agent status: Secondary | ICD-10-CM | POA: Insufficient documentation

## 2020-03-04 DIAGNOSIS — F17210 Nicotine dependence, cigarettes, uncomplicated: Secondary | ICD-10-CM | POA: Insufficient documentation

## 2020-03-04 DIAGNOSIS — G894 Chronic pain syndrome: Secondary | ICD-10-CM | POA: Insufficient documentation

## 2020-03-04 DIAGNOSIS — Z7982 Long term (current) use of aspirin: Secondary | ICD-10-CM | POA: Diagnosis not present

## 2020-03-04 DIAGNOSIS — I10 Essential (primary) hypertension: Secondary | ICD-10-CM | POA: Diagnosis not present

## 2020-03-04 DIAGNOSIS — I2510 Atherosclerotic heart disease of native coronary artery without angina pectoris: Secondary | ICD-10-CM | POA: Diagnosis not present

## 2020-03-04 DIAGNOSIS — Z7984 Long term (current) use of oral hypoglycemic drugs: Secondary | ICD-10-CM | POA: Insufficient documentation

## 2020-03-04 DIAGNOSIS — E119 Type 2 diabetes mellitus without complications: Secondary | ICD-10-CM | POA: Diagnosis not present

## 2020-03-04 DIAGNOSIS — M961 Postlaminectomy syndrome, not elsewhere classified: Secondary | ICD-10-CM | POA: Diagnosis not present

## 2020-03-04 DIAGNOSIS — E782 Mixed hyperlipidemia: Secondary | ICD-10-CM | POA: Diagnosis not present

## 2020-03-04 DIAGNOSIS — Z888 Allergy status to other drugs, medicaments and biological substances status: Secondary | ICD-10-CM | POA: Diagnosis not present

## 2020-03-04 DIAGNOSIS — K219 Gastro-esophageal reflux disease without esophagitis: Secondary | ICD-10-CM | POA: Diagnosis not present

## 2020-03-04 DIAGNOSIS — Z79899 Other long term (current) drug therapy: Secondary | ICD-10-CM | POA: Diagnosis not present

## 2020-03-04 DIAGNOSIS — Z955 Presence of coronary angioplasty implant and graft: Secondary | ICD-10-CM | POA: Diagnosis not present

## 2020-03-04 HISTORY — PX: PULSE GENERATOR IMPLANT: SHX5370

## 2020-03-04 LAB — GLUCOSE, CAPILLARY
Glucose-Capillary: 143 mg/dL — ABNORMAL HIGH (ref 70–99)
Glucose-Capillary: 168 mg/dL — ABNORMAL HIGH (ref 70–99)

## 2020-03-04 SURGERY — UNILATERAL PULSE GENERATOR IMPLANT
Anesthesia: General

## 2020-03-04 MED ORDER — KETAMINE HCL 50 MG/5ML IJ SOSY
PREFILLED_SYRINGE | INTRAMUSCULAR | Status: AC
  Filled 2020-03-04: qty 5

## 2020-03-04 MED ORDER — TRAMADOL HCL 50 MG PO TABS
50.0000 mg | ORAL_TABLET | Freq: Once | ORAL | Status: AC
  Administered 2020-03-04: 50 mg via ORAL

## 2020-03-04 MED ORDER — CHLORHEXIDINE GLUCONATE 0.12 % MT SOLN
OROMUCOSAL | Status: AC
  Administered 2020-03-04: 15 mL via OROMUCOSAL
  Filled 2020-03-04: qty 15

## 2020-03-04 MED ORDER — PROPOFOL 500 MG/50ML IV EMUL
INTRAVENOUS | Status: AC
  Filled 2020-03-04: qty 50

## 2020-03-04 MED ORDER — PROPOFOL 10 MG/ML IV BOLUS
INTRAVENOUS | Status: DC | PRN
  Administered 2020-03-04: 40 mg via INTRAVENOUS

## 2020-03-04 MED ORDER — VANCOMYCIN HCL 1 G IV SOLR
INTRAVENOUS | Status: DC | PRN
  Administered 2020-03-04: 1000 mg via TOPICAL

## 2020-03-04 MED ORDER — MIDAZOLAM HCL 2 MG/2ML IJ SOLN
INTRAMUSCULAR | Status: DC | PRN
  Administered 2020-03-04: 2 mg via INTRAVENOUS

## 2020-03-04 MED ORDER — FENTANYL CITRATE (PF) 100 MCG/2ML IJ SOLN
INTRAMUSCULAR | Status: AC
  Administered 2020-03-04: 25 ug via INTRAVENOUS
  Filled 2020-03-04: qty 2

## 2020-03-04 MED ORDER — OXYCODONE HCL 5 MG PO TABS: 5.0000 mg | ORAL_TABLET | Freq: Once | ORAL | Status: DC | PRN

## 2020-03-04 MED ORDER — CEFAZOLIN SODIUM-DEXTROSE 2-4 GM/100ML-% IV SOLN
INTRAVENOUS | Status: AC
  Filled 2020-03-04: qty 100

## 2020-03-04 MED ORDER — KETAMINE HCL 10 MG/ML IJ SOLN
INTRAMUSCULAR | Status: DC | PRN
  Administered 2020-03-04: 20 mg via INTRAVENOUS

## 2020-03-04 MED ORDER — SODIUM CHLORIDE (PF) 0.9 % IJ SOLN
INTRAMUSCULAR | Status: AC
  Filled 2020-03-04: qty 20

## 2020-03-04 MED ORDER — BUPIVACAINE HCL (PF) 0.5 % IJ SOLN
INTRAMUSCULAR | Status: AC
  Filled 2020-03-04: qty 30

## 2020-03-04 MED ORDER — REMIFENTANIL HCL 1 MG IV SOLR
INTRAVENOUS | Status: AC
  Filled 2020-03-04: qty 1000

## 2020-03-04 MED ORDER — PROPOFOL 500 MG/50ML IV EMUL
INTRAVENOUS | Status: DC | PRN
  Administered 2020-03-04: 100 ug/kg/min via INTRAVENOUS

## 2020-03-04 MED ORDER — VANCOMYCIN HCL 1000 MG IV SOLR
INTRAVENOUS | Status: AC
  Filled 2020-03-04: qty 1000

## 2020-03-04 MED ORDER — ONDANSETRON HCL 4 MG/2ML IJ SOLN
INTRAMUSCULAR | Status: DC | PRN
  Administered 2020-03-04: 4 mg via INTRAVENOUS

## 2020-03-04 MED ORDER — OXYCODONE HCL 5 MG/5ML PO SOLN
5.0000 mg | Freq: Once | ORAL | Status: DC | PRN
Start: 2020-03-04 — End: 2020-03-04

## 2020-03-04 MED ORDER — GLYCOPYRROLATE 0.2 MG/ML IJ SOLN
INTRAMUSCULAR | Status: DC | PRN
  Administered 2020-03-04: .2 mg via INTRAVENOUS

## 2020-03-04 MED ORDER — PROPOFOL 10 MG/ML IV BOLUS
INTRAVENOUS | Status: AC
  Filled 2020-03-04: qty 40

## 2020-03-04 MED ORDER — MIDAZOLAM HCL 2 MG/2ML IJ SOLN
INTRAMUSCULAR | Status: AC
  Filled 2020-03-04: qty 2

## 2020-03-04 MED ORDER — FENTANYL CITRATE (PF) 100 MCG/2ML IJ SOLN
25.0000 ug | INTRAMUSCULAR | Status: DC | PRN
Start: 2020-03-04 — End: 2020-03-04
  Administered 2020-03-04 (×3): 25 ug via INTRAVENOUS

## 2020-03-04 MED ORDER — FENTANYL CITRATE (PF) 100 MCG/2ML IJ SOLN
INTRAMUSCULAR | Status: AC
  Filled 2020-03-04: qty 2

## 2020-03-04 MED ORDER — ONDANSETRON HCL 4 MG/2ML IJ SOLN: 4.0000 mg | Freq: Once | INTRAMUSCULAR | Status: DC | PRN

## 2020-03-04 MED ORDER — FENTANYL CITRATE (PF) 100 MCG/2ML IJ SOLN
INTRAMUSCULAR | Status: DC | PRN
  Administered 2020-03-04: 25 ug via INTRAVENOUS

## 2020-03-04 MED ORDER — BUPIVACAINE-EPINEPHRINE (PF) 0.5% -1:200000 IJ SOLN
INTRAMUSCULAR | Status: DC | PRN
  Administered 2020-03-04: 9 mL

## 2020-03-04 MED ORDER — PROPOFOL 10 MG/ML IV BOLUS
INTRAVENOUS | Status: AC
  Filled 2020-03-04: qty 60

## 2020-03-04 MED ORDER — TRAMADOL HCL 50 MG PO TABS
ORAL_TABLET | ORAL | Status: AC
  Filled 2020-03-04: qty 1

## 2020-03-04 MED ORDER — DEXAMETHASONE SODIUM PHOSPHATE 10 MG/ML IJ SOLN
INTRAMUSCULAR | Status: DC | PRN
  Administered 2020-03-04: 10 mg via INTRAVENOUS

## 2020-03-04 MED ORDER — EPINEPHRINE PF 1 MG/ML IJ SOLN
INTRAMUSCULAR | Status: AC
  Filled 2020-03-04: qty 1

## 2020-03-04 SURGICAL SUPPLY — 57 items
ADH SKN CLS APL DERMABOND .7 (GAUZE/BANDAGES/DRESSINGS)
APL PRP STRL LF DISP 70% ISPRP (MISCELLANEOUS) ×2
BUR NEURO DRILL SOFT 3.0X3.8M (BURR) ×2 IMPLANT
CANISTER SUCT 1200ML W/VALVE (MISCELLANEOUS) ×2 IMPLANT
CHLORAPREP W/TINT 26 (MISCELLANEOUS) ×4 IMPLANT
COUNTER NEEDLE 20/40 LG (NEEDLE) ×2 IMPLANT
COVER LIGHT HANDLE STERIS (MISCELLANEOUS) ×4 IMPLANT
COVER WAND RF STERILE (DRAPES) ×2 IMPLANT
CUP MEDICINE 2OZ PLAST GRAD ST (MISCELLANEOUS) ×2 IMPLANT
DERMABOND ADVANCED (GAUZE/BANDAGES/DRESSINGS)
DERMABOND ADVANCED .7 DNX12 (GAUZE/BANDAGES/DRESSINGS) IMPLANT
DEVICE IMPLANT NEUROSTIMULATOR (Neuro Prosthesis/Implant) ×1 IMPLANT
DRAPE C-ARMOR (DRAPES) IMPLANT
DRAPE INCISE IOBAN 66X45 STRL (DRAPES) ×2 IMPLANT
DRAPE LAPAROTOMY 77X122 PED (DRAPES) ×2 IMPLANT
DRAPE SURG 17X11 SM STRL (DRAPES) ×2 IMPLANT
DRSG TEGADERM 4X4.75 (GAUZE/BANDAGES/DRESSINGS) IMPLANT
DRSG TELFA 4X3 1S NADH ST (GAUZE/BANDAGES/DRESSINGS) IMPLANT
ELECT CAUTERY BLADE TIP 2.5 (TIP) ×2
ELECT REM PT RETURN 9FT ADLT (ELECTROSURGICAL) ×2
ELECTRODE CAUTERY BLDE TIP 2.5 (TIP) ×1 IMPLANT
ELECTRODE REM PT RTRN 9FT ADLT (ELECTROSURGICAL) ×1 IMPLANT
ENVELOPE ABSORB ANTIBACTERIAL (Mesh General) ×2 IMPLANT
FEE INTRAOP MONITOR IMPULS NCS (MISCELLANEOUS) IMPLANT
GAUZE SPONGE 4X4 12PLY STRL (GAUZE/BANDAGES/DRESSINGS) ×2 IMPLANT
GLOVE SRG 8 PF TXTR STRL LF DI (GLOVE) ×1 IMPLANT
GLOVE SURG SYN 7.0 (GLOVE) ×4 IMPLANT
GLOVE SURG SYN 7.0 PF PI (GLOVE) ×2 IMPLANT
GLOVE SURG SYN 8.0 (GLOVE) ×4 IMPLANT
GLOVE SURG SYN 8.0 PF PI (GLOVE) ×2 IMPLANT
GLOVE SURG UNDER POLY LF SZ7 (GLOVE) ×2 IMPLANT
GLOVE SURG UNDER POLY LF SZ8 (GLOVE) ×2
GOWN STRL REUS W/ TWL LRG LVL3 (GOWN DISPOSABLE) ×1 IMPLANT
GOWN STRL REUS W/ TWL XL LVL3 (GOWN DISPOSABLE) ×2 IMPLANT
GOWN STRL REUS W/TWL LRG LVL3 (GOWN DISPOSABLE) ×2
GOWN STRL REUS W/TWL XL LVL3 (GOWN DISPOSABLE) ×4
GRADUATE 1200CC STRL 31836 (MISCELLANEOUS) ×2 IMPLANT
INTRAOP MONITOR FEE IMPULS NCS (MISCELLANEOUS)
INTRAOP MONITOR FEE IMPULSE (MISCELLANEOUS)
KIT TURNOVER KIT A (KITS) ×2 IMPLANT
KIT WILSON FRAME (KITS) ×1 IMPLANT
MANIFOLD NEPTUNE II (INSTRUMENTS) ×2 IMPLANT
MARKER SKIN DUAL TIP RULER LAB (MISCELLANEOUS) ×2 IMPLANT
NEEDLE HYPO 22GX1.5 SAFETY (NEEDLE) ×2 IMPLANT
NS IRRIG 1000ML POUR BTL (IV SOLUTION) ×2 IMPLANT
PACK LAMINECTOMY NEURO (CUSTOM PROCEDURE TRAY) ×2 IMPLANT
PAD ARMBOARD 7.5X6 YLW CONV (MISCELLANEOUS) ×2 IMPLANT
POUCH TYRX ANTIBAC NEURO MED (Mesh General) IMPLANT
RECHARGER INTELLIS (NEUROSURGERY SUPPLIES) ×1 IMPLANT
REPROGRAMMER INTELLIS (NEUROSURGERY SUPPLIES) ×2 IMPLANT
SUT ETHILON 3-0 FS-10 30 BLK (SUTURE) ×4
SUT POLYSORB 2-0 5X18 GS-10 (SUTURE) ×6 IMPLANT
SUT SILK 2 0 PERMA HAND 18 BK (SUTURE) IMPLANT
SUT SILK 2 0 SH (SUTURE) ×2 IMPLANT
SUTURE EHLN 3-0 FS-10 30 BLK (SUTURE) IMPLANT
SYR BULB IRRIG 60ML STRL (SYRINGE) ×2 IMPLANT
TOWEL OR 17X26 4PK STRL BLUE (TOWEL DISPOSABLE) ×4 IMPLANT

## 2020-03-04 NOTE — Interval H&P Note (Signed)
Patient had SCS trial placed last week and has done well, will proceed with pulse generator placement today  CV: Regular rate and rhythm Pulm: Clear to auscultation   History and Physical Interval Note:  03/04/2020 6:34 AM  Walter Salas  has presented today for surgery, with the diagnosis of G89.4 CHRONIC PAIN SYNDROME M96.1 FAILED BACK SURGICAL SYNDROME.  The various methods of treatment have been discussed with the patient and family. After consideration of risks, benefits and other options for treatment, the patient has consented to  Procedure(s) with comments: [place] (N/A) - 1ST CASE as a surgical intervention.  The patient's history has been reviewed, patient examined, no change in status, stable for surgery.  I have reviewed the patient's chart and labs.  Questions were answered to the patient's satisfaction.     Deetta Perla

## 2020-03-04 NOTE — Anesthesia Postprocedure Evaluation (Signed)
Anesthesia Post Note  Patient: Walter Salas  Procedure(s) Performed: PULSE GENERATOR PLACEMENT (N/A )  Patient location during evaluation: PACU Anesthesia Type: General Level of consciousness: awake and alert Pain management: pain level controlled Vital Signs Assessment: post-procedure vital signs reviewed and stable Respiratory status: spontaneous breathing, nonlabored ventilation and respiratory function stable Cardiovascular status: blood pressure returned to baseline and stable Postop Assessment: no apparent nausea or vomiting Anesthetic complications: no   No complications documented.   Last Vitals:  Vitals:   03/04/20 0844 03/04/20 0858  BP: (!) 107/58 124/68  Pulse: 73 68  Resp: 12 12  Temp: 36.6 C (!) 36.3 C  SpO2: 99% 100%    Last Pain:  Vitals:   03/04/20 0858  TempSrc: Temporal  PainSc: [place]

## 2020-03-04 NOTE — Discharge Instructions (Signed)
NEUROSURGERY DISCHARGE INSTRUCTIONS  Admission diagnosis: G89.4 CHRONIC PAIN SYNDROME M96.1 FAILED BACK SURGICAL SYNDROME  Operative procedure: Placement of stimulator battery  What to do after you leave the hospital:  Recommended diet: regular diet. Increase protein intake to promote wound healing.  Recommended activity: no heavy lifting, pushing, pulling for 6 weeks. You should walk multiple times per day  Special Instructions  No straining, no heavy lifting > 10lbs x 6 weeks.  Keep incision area clean and dry. May shower in 2 days. No baths or pools for 6 weeks.  Please remove all dressings tomorrow, no need to apply a bandage afterwards  You have sutures that will be removed in clinic. Please take pain medications as directed. Take a stool softener if on pain medications   Please Report any of the following: Nausea or Vomiting, Temperature is greater than 101.64F (38.1C) degrees, Dizziness, Abdominal Pain, Difficulty Breathing or Shortness of Breath, Inability to Eat, drink Fluids, or Take medications, Bleeding, swelling, or drainage from surgical incision sites, New numbness or weakness, and Bowel or bladder dysfunction to the neurosurgeon on call at [phone]  Additional Follow up appointments Please follow up with Dr Lacinda Axon in [place] clinic as scheduled in 2-3 weeks   Please see below for scheduled appointments:  Future Appointments  Date Time Provider [place]  04/03/2020 11:30 AM [facility] None  04/10/2020 10:00 AM Earlie Server, MD [facility] None  07/04/2020  8:00 AM End, Harrell Gave, MD [facility]  07/17/2020  8:00 AM Leone Haven, MD [facility]  01/22/2021  9:30 AM O'Brien-Blaney, Denisa L, LPN [facility]

## 2020-03-04 NOTE — Op Note (Signed)
Operative Note  SURGERY DATE:03/04/2020  PRE-OP DIAGNOSIS: Chronic Pain Syndrome(G89.4)  POST-OP DIAGNOSIS:Chronic Pain Syndrome(G89.4)  Procedure(s) with comments: Placement Right Flank Pulse Generator  SURGEON:  * Malen Gauze, MD   ANESTHESIA:MAC  OPERATIVE FINDINGS:Successful Spinal Cord Stimulator Trial  Indications: Walter Salas had a SCS trial placed via laminectomy on 02/26/2019 with externalized leads. He went home for a week and had great coverage of his pain. He returns today for removal of extensions and placement of pulse genrator. The risks including hematoma, infection, damage to spinal cord stimulator wires, failure of relief of pain, and need for revision surgery were discussed. The patient elected to proceed with the battery placement  Procedure:  The patient was brought to the operating room and placed prone on gel rolls on a standard OR table. The anesthesia service had vascular access and provided sedation medications to the patient.  The right flank incision and left flank exit site were prepped and draped in a sterile fashion and the previous incision marked.  A hard timeout was performed.  Local anesthetic was instilled into the incision. Sutures were removed  Incision was opened.  The wires were identified and disconnected from the extension leads.  These were then cut and removed in a sterile fashion.  Hemostasis was obtained and the battery pocket was irrigated with antibiotic saline.  Electrodes were then attached to the new battery and interrogated. The impedences were found to be normal.  Electrodes were secured and tightened.  The battery was placed into the preformed pocket with an antibiotic pouch and secured with a 2-0 silk suture.  Vancomycin powder was placed into the pocket.  The battery was then checked and found to be working appropriately.  The deep tissue was closed with 2-0 Vicryl and subcutaneous with 2-0 Vicryl.  The skin  was closed with 3-0 nylon.  A dressing was applied.    The exit site in the left flank was also closed with a 3-0 nylon patient was turned to the supine position and sedation medications were stopped and the patient was awoken from anesthesia.  Patient was taken to the PACU for further recovery and seen to be at neurologic baseline.  I discussed the results with the family        ESTIMATED BLOOD LOSS: 20cc   IMPLANT DEVICE Lucillie Garfinkel [id number] H  Inventory Item: DEVICE IMPLANT NEUROSTIMINATOR Serial no.: [id number] Model/Cat no.: [id number]  Implant name: DEVICE Lucillie Garfinkel [id number] H Laterality: N/A Area: Back   Manufacturer: [employer] NEUROMOD PAIN MGMT Date of Manufacture:    Action: Implanted Number Used: 1   Device Identifier:  Device Identifier Type:     ENVELOPE ABSORB ANTIBACTERIAL - [id number]  Inventory Item: ENVELOPE ABSORB ANTIBACTERIAL Serial no.:  Model/Cat no.: [id number]  Implant name: ENVELOPE ABSORB ANTIBACTERIAL - [id number] Laterality: N/A Area: Back   Manufacturer: [employer] [place] Date of Manufacture:    Action: Implanted Number Used: 1   Device Identifier:  Device Identifier Type:        I performed the case in its entirety  Deetta Perla, [place]

## 2020-03-04 NOTE — Transfer of Care (Signed)
Immediate Anesthesia Transfer of Care Note  Patient: Walter Salas  Procedure(s) Performed: PULSE GENERATOR PLACEMENT (N/A )  Patient Location: PACU  Anesthesia Type:General  Level of Consciousness: awake, drowsy and patient cooperative  Airway & Oxygen Therapy: Patient Spontanous Breathing and Patient connected to face mask oxygen  Post-op Assessment: Report given to RN and Post -op Vital signs reviewed and stable  Post vital signs: Reviewed and stable  Last Vitals:  Vitals Value Taken Time  BP 126/71 03/04/20 0804  Temp 36.6 C 03/04/20 0804  Pulse 84 03/04/20 0808  Resp 13 03/04/20 0808  SpO2 100 % 03/04/20 0808  Vitals shown include unvalidated device data.  Last Pain:  Vitals:   03/04/20 0636  TempSrc: Oral  PainSc: 3          Complications: No complications documented.

## 2020-03-05 ENCOUNTER — Encounter: Payer: Self-pay | Admitting: Neurosurgery

## 2020-03-14 ENCOUNTER — Other Ambulatory Visit: Payer: Self-pay | Admitting: Family Medicine

## 2020-03-14 DIAGNOSIS — M5442 Lumbago with sciatica, left side: Secondary | ICD-10-CM

## 2020-04-03 ENCOUNTER — Inpatient Hospital Stay: Payer: Medicare Other | Attending: Oncology

## 2020-04-03 ENCOUNTER — Other Ambulatory Visit: Payer: Self-pay

## 2020-04-03 DIAGNOSIS — G4730 Sleep apnea, unspecified: Secondary | ICD-10-CM | POA: Insufficient documentation

## 2020-04-03 DIAGNOSIS — I5032 Chronic diastolic (congestive) heart failure: Secondary | ICD-10-CM | POA: Diagnosis not present

## 2020-04-03 DIAGNOSIS — F17210 Nicotine dependence, cigarettes, uncomplicated: Secondary | ICD-10-CM | POA: Diagnosis not present

## 2020-04-03 DIAGNOSIS — I110 Hypertensive heart disease with heart failure: Secondary | ICD-10-CM | POA: Insufficient documentation

## 2020-04-03 DIAGNOSIS — Z8719 Personal history of other diseases of the digestive system: Secondary | ICD-10-CM | POA: Insufficient documentation

## 2020-04-03 DIAGNOSIS — M1990 Unspecified osteoarthritis, unspecified site: Secondary | ICD-10-CM | POA: Diagnosis not present

## 2020-04-03 DIAGNOSIS — Z7982 Long term (current) use of aspirin: Secondary | ICD-10-CM | POA: Diagnosis not present

## 2020-04-03 DIAGNOSIS — Z79899 Other long term (current) drug therapy: Secondary | ICD-10-CM | POA: Diagnosis not present

## 2020-04-03 DIAGNOSIS — E1142 Type 2 diabetes mellitus with diabetic polyneuropathy: Secondary | ICD-10-CM | POA: Diagnosis not present

## 2020-04-03 DIAGNOSIS — G629 Polyneuropathy, unspecified: Secondary | ICD-10-CM | POA: Insufficient documentation

## 2020-04-03 DIAGNOSIS — E785 Hyperlipidemia, unspecified: Secondary | ICD-10-CM | POA: Insufficient documentation

## 2020-04-03 DIAGNOSIS — Z7984 Long term (current) use of oral hypoglycemic drugs: Secondary | ICD-10-CM | POA: Insufficient documentation

## 2020-04-03 DIAGNOSIS — D472 Monoclonal gammopathy: Secondary | ICD-10-CM | POA: Insufficient documentation

## 2020-04-03 DIAGNOSIS — I252 Old myocardial infarction: Secondary | ICD-10-CM | POA: Diagnosis not present

## 2020-04-03 DIAGNOSIS — R778 Other specified abnormalities of plasma proteins: Secondary | ICD-10-CM

## 2020-04-03 DIAGNOSIS — Z87442 Personal history of urinary calculi: Secondary | ICD-10-CM | POA: Diagnosis not present

## 2020-04-03 DIAGNOSIS — K219 Gastro-esophageal reflux disease without esophagitis: Secondary | ICD-10-CM | POA: Insufficient documentation

## 2020-04-03 DIAGNOSIS — I2510 Atherosclerotic heart disease of native coronary artery without angina pectoris: Secondary | ICD-10-CM | POA: Insufficient documentation

## 2020-04-03 LAB — COMPREHENSIVE METABOLIC PANEL
ALT: 20 U/L (ref 0–44)
AST: 20 U/L (ref 15–41)
Albumin: 4.3 g/dL (ref 3.5–5.0)
Alkaline Phosphatase: 68 U/L (ref 38–126)
Anion gap: 10 (ref 5–15)
BUN: 18 mg/dL (ref 8–23)
CO2: 26 mmol/L (ref 22–32)
Calcium: 8.9 mg/dL (ref 8.9–10.3)
Chloride: 98 mmol/L (ref 98–111)
Creatinine, Ser: 0.87 mg/dL (ref 0.61–1.24)
GFR, Estimated: >60 mL/min (ref >60)
Glucose, Bld: 278 mg/dL — ABNORMAL HIGH (ref 70–99)
Potassium: 3.6 mmol/L (ref 3.5–5.1)
Sodium: 134 mmol/L — ABNORMAL LOW (ref 135–145)
Total Bilirubin: 0.9 mg/dL (ref 0.3–1.2)
Total Protein: 7.1 g/dL (ref 6.5–8.1)

## 2020-04-03 LAB — CBC WITH DIFFERENTIAL/PLATELET
Abs Immature Granulocytes: 0.01 10*3/uL (ref 0.00–0.07)
Basophils Absolute: 0.1 10*3/uL (ref 0.0–0.1)
Basophils Relative: 1 %
Eosinophils Absolute: 0.1 10*3/uL (ref 0.0–0.5)
Eosinophils Relative: 2 %
HCT: 42.1 % (ref 39.0–52.0)
Hemoglobin: 14.2 g/dL (ref 13.0–17.0)
Immature Granulocytes: 0 %
Lymphocytes Relative: 20 %
Lymphs Abs: 1.2 10*3/uL (ref 0.7–4.0)
MCH: 28.1 pg (ref 26.0–34.0)
MCHC: 33.7 g/dL (ref 30.0–36.0)
MCV: 83.2 fL (ref 80.0–100.0)
Monocytes Absolute: 0.4 10*3/uL (ref 0.1–1.0)
Monocytes Relative: 7 %
Neutro Abs: 4.2 10*3/uL (ref 1.7–7.7)
Neutrophils Relative %: 70 %
Platelets: 189 10*3/uL (ref 150–400)
RBC: 5.06 MIL/uL (ref 4.22–5.81)
RDW: 14.5 % (ref 11.5–15.5)
WBC: 6.1 10*3/uL (ref 4.0–10.5)
nRBC: 0 % (ref 0.0–0.2)

## 2020-04-04 LAB — KAPPA/LAMBDA LIGHT CHAINS
Kappa free light chain: 21 mg/L — ABNORMAL HIGH (ref 3.3–19.4)
Kappa, lambda light chain ratio: 1.63 (ref 0.26–1.65)
Lambda free light chains: 12.9 mg/L (ref 5.7–26.3)

## 2020-04-05 LAB — MULTIPLE MYELOMA PANEL, SERUM
Albumin SerPl Elph-Mcnc: 3.8 g/dL (ref 2.9–4.4)
Albumin/Glob SerPl: 1.4 (ref 0.7–1.7)
Alpha 1: 0.2 g/dL (ref 0.0–0.4)
Alpha2 Glob SerPl Elph-Mcnc: 0.8 g/dL (ref 0.4–1.0)
B-Globulin SerPl Elph-Mcnc: 1.1 g/dL (ref 0.7–1.3)
Gamma Glob SerPl Elph-Mcnc: 0.8 g/dL (ref 0.4–1.8)
Globulin, Total: 2.8 g/dL (ref 2.2–3.9)
IgA: 82 mg/dL (ref 61–437)
IgG (Immunoglobin G), Serum: 793 mg/dL (ref 603–1613)
IgM (Immunoglobulin M), Srm: 33 mg/dL (ref 20–172)
Total Protein ELP: 6.6 g/dL (ref 6.0–8.5)

## 2020-04-10 ENCOUNTER — Ambulatory Visit: Payer: Medicare Other | Admitting: Oncology

## 2020-04-11 ENCOUNTER — Encounter: Payer: Self-pay | Admitting: Oncology

## 2020-04-11 ENCOUNTER — Inpatient Hospital Stay: Payer: Medicare Other | Admitting: Oncology

## 2020-04-11 ENCOUNTER — Other Ambulatory Visit: Payer: Self-pay

## 2020-04-11 ENCOUNTER — Telehealth: Payer: Self-pay | Admitting: Family Medicine

## 2020-04-11 VITALS — BP 121/68 | HR 93 | Temp 98.2°F | Resp 18 | Wt 219.6 lb

## 2020-04-11 DIAGNOSIS — D472 Monoclonal gammopathy: Secondary | ICD-10-CM | POA: Diagnosis not present

## 2020-04-11 DIAGNOSIS — Z7982 Long term (current) use of aspirin: Secondary | ICD-10-CM | POA: Diagnosis not present

## 2020-04-11 DIAGNOSIS — F17210 Nicotine dependence, cigarettes, uncomplicated: Secondary | ICD-10-CM | POA: Diagnosis not present

## 2020-04-11 DIAGNOSIS — G629 Polyneuropathy, unspecified: Secondary | ICD-10-CM

## 2020-04-11 DIAGNOSIS — E785 Hyperlipidemia, unspecified: Secondary | ICD-10-CM | POA: Diagnosis not present

## 2020-04-11 DIAGNOSIS — M1990 Unspecified osteoarthritis, unspecified site: Secondary | ICD-10-CM | POA: Diagnosis not present

## 2020-04-11 DIAGNOSIS — I110 Hypertensive heart disease with heart failure: Secondary | ICD-10-CM | POA: Diagnosis not present

## 2020-04-11 DIAGNOSIS — I2510 Atherosclerotic heart disease of native coronary artery without angina pectoris: Secondary | ICD-10-CM | POA: Diagnosis not present

## 2020-04-11 DIAGNOSIS — G4730 Sleep apnea, unspecified: Secondary | ICD-10-CM | POA: Diagnosis not present

## 2020-04-11 DIAGNOSIS — K219 Gastro-esophageal reflux disease without esophagitis: Secondary | ICD-10-CM | POA: Diagnosis not present

## 2020-04-11 DIAGNOSIS — Z87442 Personal history of urinary calculi: Secondary | ICD-10-CM | POA: Diagnosis not present

## 2020-04-11 DIAGNOSIS — Z79899 Other long term (current) drug therapy: Secondary | ICD-10-CM | POA: Diagnosis not present

## 2020-04-11 DIAGNOSIS — R778 Other specified abnormalities of plasma proteins: Secondary | ICD-10-CM | POA: Diagnosis not present

## 2020-04-11 DIAGNOSIS — I252 Old myocardial infarction: Secondary | ICD-10-CM | POA: Diagnosis not present

## 2020-04-11 DIAGNOSIS — E1142 Type 2 diabetes mellitus with diabetic polyneuropathy: Secondary | ICD-10-CM | POA: Diagnosis not present

## 2020-04-11 DIAGNOSIS — I5032 Chronic diastolic (congestive) heart failure: Secondary | ICD-10-CM | POA: Diagnosis not present

## 2020-04-11 DIAGNOSIS — Z7984 Long term (current) use of oral hypoglycemic drugs: Secondary | ICD-10-CM | POA: Diagnosis not present

## 2020-04-11 DIAGNOSIS — Z8719 Personal history of other diseases of the digestive system: Secondary | ICD-10-CM | POA: Diagnosis not present

## 2020-04-11 NOTE — Progress Notes (Signed)
Hematology/Oncology Consult note [facility] Telephone:([phone] [phone]   Patient Care Team: Leone Haven, MD as PCP - General (Family Medicine) End, Harrell Gave, MD as PCP - Cardiology (Cardiology) Jackolyn Confer, MD (Internal Medicine) Bary Castilla Forest Gleason, MD (General Surgery)  REFERRING PROVIDER: Leone Haven, MD  CHIEF COMPLAINTS/REASON FOR VISIT:  Evaluation of abnormal SPEP  HISTORY OF PRESENTING ILLNESS:  Walter Salas is a 65 y.o. male who was seen in consultation at the request of Leone Haven, MD for evaluation of abnormal SPEP results.   Patient recently had work up done at Neurologist's office for peripheral neuropathy. Labs reviewed,  09/11/2019 SPEP showed no M spike,   , and IFE showed IgG Kappa monoclonal protein.  No aggravating or alleviated factors.  Associated signs or symptoms:  Neuropathy:  Denies weight loss, fever chills, bone pain,  Patient has history of chronic neuropathy. Patient has hypertension, diabetes.   INTERVAL HISTORY Walter Salas is a 65 y.o. male who has above history reviewed by me today presents for follow up visit for MGUS Problems and complaints are listed below: No new complaints.   Review of Systems  Constitutional: Positive for fatigue. Negative for appetite change, chills, fever and unexpected weight change.  HENT:   Negative for hearing loss and voice change.   Eyes: Negative for eye problems and icterus.  Respiratory: Negative for chest tightness, cough and shortness of breath.   Cardiovascular: Negative for chest pain and leg swelling.  Gastrointestinal: Negative for abdominal distention and abdominal pain.  Endocrine: Negative for hot flashes.  Genitourinary: Negative for difficulty urinating, dysuria and frequency.   Musculoskeletal: Negative for arthralgias.  Skin: Negative for itching and rash.  Neurological: Negative for light-headedness and numbness.   Hematological: Negative for adenopathy. Does not bruise/bleed easily.  Psychiatric/Behavioral: Negative for confusion.     MEDICAL HISTORY:  Past Medical History:  Diagnosis Date  . (HFpEF) heart failure with preserved ejection fraction ([place])    a. 2018 Echo: EF 60-65%, normal wall motion, Gr1DD, mild aortic valve calcification; b. 12/2017 Echo: EF 60-65%, no rwma, Ao root 4.3cm, Asc Ao 4.4 cm, mildly dil LA.  Walter Salas Agent orange exposure   . Anginal pain ([place])   . Arthritis   . CAD (coronary artery disease)    a. s/p PCI/DES to the LCx and RCA in 2014; b. 05/2016 MV: EF 55-65%. Small, mild defect in basal anterolateral location->infarct w/o ischemia.  . Cancer ([place])    skin  . Chronic cough   . Chronic pain   . Colon polyp   . Depression   . Diabetes mellitus without complication ([place])   . Diverticulosis   . GERD (gastroesophageal reflux disease)   . History of kidney stones   . History of stomach ulcers   . HOH (hard of hearing)   . Hx of laminectomy 1999  . Hyperlipidemia   . Hypertension   . Myocardial infarction ([place])   . Sleep apnea   . Syncope    a. Event monitor 2018: NSR with first-degree AV block, no significant arrhythmias or pauses; b.  Myoview showed a small in size, mild in severity fixed basal and anterior lateral defect, no ischemia, LVEF 55 to 65%, low risk study  . Thoracic aortic aneurysm ([place])    a. TTE 2018: Mildly dil Ao root- 4.2 cm; b. CTA aorta 6/18: Asc Ao aneurysmal dil w max diam of 4.3 cm; b. 12/2019 CTA Chest: stable 4.3cm thor Ao  Aneurysm.  Walter Salas Ulcer     SURGICAL HISTORY: Past Surgical History:  Procedure Laterality Date  . [place]  . CARPAL TUNNEL RELEASE Bilateral 1990  . CATARACT EXTRACTION W/PHACO Left 03/08/2018   Procedure: CATARACT EXTRACTION PHACO AND INTRAOCULAR LENS PLACEMENT ([place]) LEFT;  Surgeon: Birder Robson, MD;  Location: [facility];  Service: Ophthalmology;  Laterality: Left;  [place]  00:24.2 CDE  3.25 Fluid pack lot # [id number]  . CATARACT EXTRACTION W/PHACO Right 05/24/2018   Procedure: CATARACT EXTRACTION PHACO AND INTRAOCULAR LENS PLACEMENT ([facility]) RIGHT;  Surgeon: Birder Robson, MD;  Location: [facility];  Service: Ophthalmology;  Laterality: Right;  Diabetic - oral meds  . CERVICAL FUSION  2000. 2003  . CHOLECYSTECTOMY  2000  . COLONOSCOPY WITH PROPOFOL N/A 02/23/2017   Procedure: COLONOSCOPY WITH PROPOFOL;  Surgeon: Lollie Sails, MD;  Location: [facility] ENDOSCOPY;  Service: Endoscopy;  Laterality: N/A;  . CORONARY ANGIOPLASTY WITH STENT PLACEMENT  2014   [facility] [employer], [facility]  . ESOPHAGOGASTRODUODENOSCOPY (EGD) WITH PROPOFOL N/A 02/23/2017   Procedure: ESOPHAGOGASTRODUODENOSCOPY (EGD) WITH PROPOFOL;  Surgeon: Lollie Sails, MD;  Location: [facility] ENDOSCOPY;  Service: Endoscopy;  Laterality: N/A;  . PULSE GENERATOR IMPLANT N/A 03/04/2020   Procedure: PULSE GENERATOR PLACEMENT;  Surgeon: Deetta Perla, MD;  Location: [facility] ORS;  Service: Neurosurgery;  Laterality: N/A;  1ST CASE  . SHOULDER ARTHROSCOPY Right 2012  . THORACIC LAMINECTOMY FOR SPINAL CORD STIMULATOR N/A 02/26/2020   Procedure: THORACIC LAMINECTOMY FOR SPINAL CORD STIMULATOR PADDLE TRIAL;  Surgeon: Deetta Perla, MD;  Location: [facility] ORS;  Service: Neurosurgery;  Laterality: N/A;  1ST CASE  . TUMOR EXCISION  2007    SOCIAL HISTORY: Social History   Socioeconomic History  . Marital status: Married    Spouse name: Not on file  . Number of children: Not on file  . Years of education: Not on file  . Highest education level: Not on file  Occupational History  . Not on file  Tobacco Use  . Smoking status: Current Every Day Smoker    Packs/day: 1.00    Years: 50.00    Pack years: 50.00    Types: Cigarettes    Last attempt to quit: 02/15/2018    Years since quitting: 2.1  . Smokeless tobacco: Never Used  Vaping Use  . Vaping Use: Never used  Substance and Sexual Activity  .  Alcohol use: No  . Drug use: No  . Sexual activity: Never  Other Topics Concern  . Not on file  Social History Narrative   Lives in [place] with wife. Has cat.   Work - Database administrator, retired      Followed at [employer].      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      Diet - Regular   Exercise - starting back at gym   Social Determinants of Health   Financial Resource Strain: Low Risk   . Difficulty of Paying Living Expenses: Not hard at all  Food Insecurity: No Food Insecurity  . Worried About Charity fundraiser in the Last Year: Never true  . Ran Out of Food in the Last Year: Never true  Transportation Needs: No Transportation Needs  . Lack of Transportation (Medical): No  . Lack of Transportation (Non-Medical): No  Physical Activity: Insufficiently Active  . Days of Exercise per Week: 2 days  . Minutes of Exercise per Session: 20 min  Stress: No Stress Concern Present  . Feeling of Stress : Not at all  Social Connections: Unknown  . Frequency of Communication with Friends and Family: Not on file  . Frequency of Social Gatherings with Friends and Family: Not on file  . Attends Religious Services: Not on file  . Active Member of Clubs or Organizations: Not on file  . Attends Archivist Meetings: Not on file  . Marital Status: Married  Human resources officer Violence: Not At Risk  . Fear of Current or Ex-Partner: No  . Emotionally Abused: No  . Physically Abused: No  . Sexually Abused: No    FAMILY HISTORY: Family History  Problem Relation Age of Onset  . Diabetes Mother   . Heart disease Father   . Diabetes Father   . Diabetes Sister   . Heart disease Sister   . Diabetes Brother   . Heart disease Brother   . Heart disease Paternal Grandfather   . Cancer Neg Hx     ALLERGIES:  is allergic to gabapentin, isosorbide mononitrate er [isosorbide dinitrate], lisinopril, and tylenol [acetaminophen].  MEDICATIONS:  Current Outpatient  Medications  Medication Sig Dispense Refill  . amLODipine (NORVASC) 2.5 MG tablet Take 1 tablet (2.5 mg total) by mouth daily. 90 tablet 1  . ASPIRIN 81 PO TAKE 81 MG BY MOUTH ONCE EVERY DAY    . atorvastatin (LIPITOR) 80 MG tablet Take 80 mg by mouth at bedtime.    . chlorthalidone (HYGROTON) 50 MG tablet Take 25 mg by mouth daily.    . cholecalciferol (VITAMIN D) 1000 UNITS tablet Take 1 tablet (1,000 Units total) by mouth QID. (Patient taking differently: Take 4,000 Units by mouth daily. 4000 IU daily) 90 tablet 0  . DULoxetine (CYMBALTA) 20 MG capsule Take 20 mg by mouth daily.    . fenofibrate (TRICOR) 145 MG tablet Take 1 tablet (145 mg total) by mouth daily. 30 tablet 0  . glucose blood test strip Check once daily, E11.9 100 each 12  . lidocaine (LIDODERM) 5 % 1 patch daily as needed-Remove & Discard patch within 12 hours or as directed by MD    . losartan (COZAAR) 25 MG tablet Take 25 mg by mouth daily.    . metFORMIN (GLUCOPHAGE) 1000 MG tablet Take 1,000 mg by mouth 2 (two) times daily with a meal.    . methyl salicylate liquid Apply 1 application topically as needed for muscle pain.    . nitroGLYCERIN (NITROSTAT) 0.4 MG SL tablet Place 1 tablet (0.4 mg total) under the tongue every 5 (five) minutes as needed for chest pain. May take up to 3 doses. 30 tablet 0  . omeprazole (PRILOSEC) 20 MG capsule Take 20 mg by mouth daily.    Walter Salas OZEMPIC, 1 MG/DOSE, 4 MG/3ML SOPN INJECT 0.75 ML (1 MG) INTO THE SKIN ONCEA WEEK 9 mL 1  . polyvinyl alcohol (LIQUIFILM TEARS) 1.4 % ophthalmic solution Place 1 drop into both eyes as needed for dry eyes.     . potassium chloride SA (K-DUR,KLOR-CON) 20 MEQ tablet Take 1 tablet (20 mEq total) by mouth daily. 90 tablet 3  . pregabalin (LYRICA) 50 MG capsule Take 1 capsule (50 mg total) by mouth 3 (three) times daily. 30 capsule 0  . sitaGLIPtin (JANUVIA) 25 MG tablet TAKE FOUR TABLETS BY MOUTH ONCE EVERY DAY    . tamsulosin (FLOMAX) 0.4 MG CAPS capsule TAKE 1  CAPSULE BY MOUTH ONCE DAILY 30 capsule 3  . traMADol (ULTRAM) 50  MG tablet TAKE 2 TABLETS BY MOUTH EVERY 8 HOURS ASNEEDED FOR MODERATE PAIN 90 tablet 0  . VASCEPA 1 g capsule TAKE 2 CAPSULES BY MOUTH TWICE DAILY 120 capsule 3  . vitamin B-12 (CYANOCOBALAMIN) 1000 MCG tablet Take 1 tablet (1,000 mcg total) by mouth daily. 30 tablet 0  . methocarbamol (ROBAXIN) 500 MG tablet Take 500 mg by mouth 2 (two) times daily as needed for muscle spasms.      No current facility-administered medications for this visit.     PHYSICAL EXAMINATION: ECOG PERFORMANCE STATUS: 0 - Asymptomatic Vitals:   04/11/20 0929  BP: 121/68  Pulse: 93  Resp: 18  Temp: 98.2 F (36.8 C)   Filed Weights   04/11/20 0929  Weight: 219 lb 9.6 oz (99.6 kg)    Physical Exam Constitutional:      General: He is not in acute distress. HENT:     Head: Normocephalic and atraumatic.  Eyes:     General: No scleral icterus. Cardiovascular:     Rate and Rhythm: Normal rate and regular rhythm.     Heart sounds: Normal heart sounds.  Pulmonary:     Effort: Pulmonary effort is normal. No respiratory distress.     Breath sounds: No wheezing.  Abdominal:     General: Bowel sounds are normal. There is no distension.     Palpations: Abdomen is soft.  Musculoskeletal:        General: No deformity. Normal range of motion.     Cervical back: Normal range of motion and neck supple.  Skin:    General: Skin is warm and dry.     Findings: No erythema or rash.  Neurological:     Mental Status: He is alert and oriented to person, place, and time. Mental status is at baseline.     Cranial Nerves: No cranial nerve deficit.     Coordination: Coordination normal.  Psychiatric:        Mood and Affect: Mood normal.      LABORATORY DATA:  I have reviewed the data as listed Lab Results  Component Value Date   WBC 6.1 04/03/2020   HGB 14.2 04/03/2020   HCT 42.1 04/03/2020   MCV 83.2 04/03/2020   PLT 189 04/03/2020   Recent  Labs    08/07/19 0831 10/02/19 1207 12/22/19 1227 02/19/20 1005 04/03/20 1123  NA 133* 137 137 136 134*  K 3.7 3.7 3.5 3.4* 3.6  CL 96 98 99 98 98  CO2 _0 GLUCOSE 163* 138* 130* 137* 278*  BUN _1 CREATININE 0.71 0.90 0.80 0.73 0.87  CALCIUM 9.5 8.9 8.9 8.9 8.9  GFRNONAA  --  >60 >60 >60 >60  GFRAA  --  >60  --   --   --   PROT 6.9 7.8  --   --  7.1  ALBUMIN 4.6 4.7  --   --  4.3  AST 13 20  --   --  20  ALT 13 17  --   --  20  ALKPHOS 61 50  --   --  68  BILITOT 0.8 0.8  --   --  0.9   Iron/TIBC/Ferritin/ %Sat No results found for: IRON, TIBC, FERRITIN, IRONPCTSAT    RADIOGRAPHIC STUDIES: I have personally reviewed the radiological images as listed and agreed with the findings in the report.  No results found.   ASSESSMENT & PLAN:  1. Abnormal SPEP  2. Neuropathy     IgG kappa MGUS. Labs are reviewed and discussed with patient. M protein is not detectable. IFE showed normal pattern.  Free light chain ratio is normal. Recommend observation. Peripheral neuropathy, follow-up with neurology.  Likely secondary to diabetes, radiculopathy from multiple spine surgeries.  Follow up in 1 year Orders Placed This Encounter  Procedures  . CBC with Differential/Platelet    Standing Status:   Future    Standing Expiration Date:   04/11/2021  . Comprehensive metabolic panel    Standing Status:   Future    Standing Expiration Date:   04/11/2021  . Multiple Myeloma Panel (SPEP&IFE w/QIG)    Standing Status:   Future    Standing Expiration Date:   04/11/2021  . Kappa/lambda light chains    Standing Status:   Future    Standing Expiration Date:   04/11/2021    All questions were answered. The patient knows to call the clinic with any problems questions or concerns.  Cc Leone Haven, MD  Earlie Server, MD, PhD 04/11/2020

## 2020-04-11 NOTE — Telephone Encounter (Signed)
Patient called in stated that the his insurance stated he could get a 90 day supply of OZEMPIC, 1 MG/DOSE, 4 MG/3ML SOPN

## 2020-04-11 NOTE — Progress Notes (Signed)
Patient denies new problems/concerns today.   °

## 2020-04-12 MED ORDER — OZEMPIC (1 MG/DOSE) 4 MG/3ML ~~LOC~~ SOPN: PEN_INJECTOR | SUBCUTANEOUS | 1 refills | Status: DC

## 2020-04-12 NOTE — Telephone Encounter (Signed)
Noted he had 12 weeks worth sent in previously.  That should be 90 days worth of medication.  Please see if they have been giving him 12 weeks worth of Ozempic.  Please also see if he is still taking Januvia.  Given that he is on Ozempic he does not need to take Januvia if he is still taking it.

## 2020-04-13 DIAGNOSIS — R1031 Right lower quadrant pain: Secondary | ICD-10-CM | POA: Diagnosis not present

## 2020-04-13 DIAGNOSIS — R1032 Left lower quadrant pain: Secondary | ICD-10-CM | POA: Diagnosis not present

## 2020-04-16 ENCOUNTER — Telehealth: Payer: Self-pay | Admitting: Family Medicine

## 2020-04-16 NOTE — Telephone Encounter (Signed)
Please confirm with the patient if he is taking Lipitor 80 mg once daily.  It is on his medication list. I received a fax with a question from his local pharmacy regarding this.  I suspect he is getting this from the [place].

## 2020-04-18 ENCOUNTER — Other Ambulatory Visit: Payer: Self-pay | Admitting: Internal Medicine

## 2020-04-18 DIAGNOSIS — M5442 Lumbago with sciatica, left side: Secondary | ICD-10-CM

## 2020-04-22 NOTE — Telephone Encounter (Signed)
LVM for the patient to call back. Rochell Mabie,cma  

## 2020-04-30 NOTE — Telephone Encounter (Signed)
Called and LVM for the patient to call back to confirm if he is taking Lipitor and if he is getting it through the [place].  Mutasim Tuckey,cma

## 2020-05-02 NOTE — Telephone Encounter (Signed)
I reached the patient by phone and asked him if he is still taking the Lipitor 80 mg and he stated he is and he did received that medication from the [place].  Bryli Mantey,cma

## 2020-05-02 NOTE — Telephone Encounter (Signed)
Noted. Can you place the form on my desk so I can complete it for his pharmacy?

## 2020-05-02 NOTE — Telephone Encounter (Signed)
Form was placed on providers desk.  Seanna Sisler,cma

## 2020-05-07 ENCOUNTER — Telehealth: Payer: Self-pay | Admitting: Family Medicine

## 2020-05-07 ENCOUNTER — Other Ambulatory Visit: Payer: Self-pay | Admitting: Family Medicine

## 2020-05-07 DIAGNOSIS — M5442 Lumbago with sciatica, left side: Secondary | ICD-10-CM

## 2020-05-07 MED ORDER — TRAMADOL HCL 50 MG PO TABS
ORAL_TABLET | ORAL | 0 refills | Status: DC
Start: 2020-05-07 — End: 2020-06-04

## 2020-05-07 MED ORDER — OZEMPIC (1 MG/DOSE) 4 MG/3ML ~~LOC~~ SOPN: PEN_INJECTOR | SUBCUTANEOUS | 1 refills | Status: DC

## 2020-05-07 NOTE — Telephone Encounter (Signed)
RX Refill:tramadol Last Seen:01-15-20 Last ordered:04-18-20  

## 2020-05-07 NOTE — Telephone Encounter (Signed)
Sent to pharmacy 

## 2020-05-07 NOTE — Telephone Encounter (Signed)
Patient's pharmacy told patient to call office for the following refills; Semaglutide, 1 MG/DOSE, (OZEMPIC, 1 MG/DOSE,) 4 MG/3ML SOPN, theis medication should be a 3 month supply, traMADol (ULTRAM) 50 MG tablet. Pharmacy is [facility].

## 2020-05-07 NOTE — Telephone Encounter (Signed)
RX Refill:tramadol Last Seen:01-15-20 Last ordered:04-18-20

## 2020-06-03 ENCOUNTER — Other Ambulatory Visit: Payer: Self-pay | Admitting: Family Medicine

## 2020-06-03 DIAGNOSIS — M5442 Lumbago with sciatica, left side: Secondary | ICD-10-CM

## 2020-06-03 NOTE — Telephone Encounter (Signed)
RX Refill:tramadol Last Seen:01-15-20 Last ordered:05-07-20

## 2020-06-24 ENCOUNTER — Other Ambulatory Visit: Payer: Self-pay | Admitting: Family Medicine

## 2020-06-24 ENCOUNTER — Other Ambulatory Visit: Payer: Self-pay | Admitting: Internal Medicine

## 2020-06-24 DIAGNOSIS — M5442 Lumbago with sciatica, left side: Secondary | ICD-10-CM

## 2020-06-24 NOTE — Telephone Encounter (Signed)
RX Refill:tramadol Last Seen:01/15/20 Last ordered:06-04-20

## 2020-07-04 ENCOUNTER — Encounter: Payer: Self-pay | Admitting: Internal Medicine

## 2020-07-04 ENCOUNTER — Other Ambulatory Visit: Payer: Self-pay

## 2020-07-04 ENCOUNTER — Ambulatory Visit: Payer: Medicare Other | Admitting: Internal Medicine

## 2020-07-04 VITALS — BP 120/66 | HR 55 | Ht 70.0 in | Wt 221.0 lb

## 2020-07-04 DIAGNOSIS — I10 Essential (primary) hypertension: Secondary | ICD-10-CM

## 2020-07-04 DIAGNOSIS — E781 Pure hyperglyceridemia: Secondary | ICD-10-CM | POA: Insufficient documentation

## 2020-07-04 DIAGNOSIS — I712 Thoracic aortic aneurysm, without rupture, unspecified: Secondary | ICD-10-CM

## 2020-07-04 DIAGNOSIS — E1169 Type 2 diabetes mellitus with other specified complication: Secondary | ICD-10-CM | POA: Diagnosis not present

## 2020-07-04 DIAGNOSIS — E785 Hyperlipidemia, unspecified: Secondary | ICD-10-CM | POA: Diagnosis not present

## 2020-07-04 DIAGNOSIS — I2510 Atherosclerotic heart disease of native coronary artery without angina pectoris: Secondary | ICD-10-CM | POA: Diagnosis not present

## 2020-07-04 NOTE — Patient Instructions (Signed)
Medication Instructions:   Your physician recommends that you continue on your current medications as directed. Please refer to the Current Medication list given to you today.  *If you need a refill on your cardiac medications before your next appointment, please call your pharmacy*   Lab Work:  None ordered  Testing/Procedures:  We will be in contact with you regarding scheduling your Thoracic CTA for December 2022.   Follow-Up: At [facility], you and your health needs are our priority.  As part of our continuing mission to provide you with exceptional heart care, we have created designated Provider Care Teams.  These Care Teams include your primary Cardiologist (physician) and Advanced Practice Providers (APPs -  Physician Assistants and Nurse Practitioners) who all work together to provide you with the care you need, when you need it.  We recommend signing up for the patient portal called "MyChart".  Sign up information is provided on this After Visit Summary.  MyChart is used to connect with patients for Virtual Visits (Telemedicine).  Patients are able to view lab/test results, encounter notes, upcoming appointments, etc.  Non-urgent messages can be sent to your provider as well.   To learn more about what you can do with MyChart, go to [email or web address].    Your next appointment:   6 month(s)  The format for your next appointment:   In Person  Provider:   You may see Nelva Bush, MD or one of the following Advanced Practice Providers on your designated Care Team:   Murray Hodgkins, NP Christell Faith, PA-C Marrianne Mood, PA-C Cadence [place], [place] Laurann Montana, NP

## 2020-07-04 NOTE — Progress Notes (Signed)
Follow-up Outpatient Visit Date: 07/04/2020  Primary Care Provider: Leone Haven, MD [address] [place] [address]  Chief Complaint: Follow-up coronary artery disease and thoracic aortic aneurysm  HPI:  Walter Salas is a 65 y.o. male with history of coronary artery disease status post PCI's to the LCx and RCA in 2014, acsending aortic aneurysm, HTN, HLD, type II DM, depression, renal stones, and fall with intracranial hemorrhage, who presents for follow-up of coronary artery disease and thoracic aortic aneurysm.  He was last seen in our office in 12/2019 by Ignacia Bayley, NP, at which time he was doing well.  Smoking cessation was encouraged; no medication changes or additional testing were pursued.  Today, Walter Salas reports that he has been feeling relatively well.  His biggest concern remains low back and left leg pain, though this has improved following spinal nerve stimulator implantation.  He denies chest pain, shortness of breath, palpitations, lightheadedness, and edema.  Home blood pressures are typically well controlled.  He is scheduled for annual follow-up with Dr. Caryl Bis next month, including a lipid panel.  --------------------------------------------------------------------------------------------------  Past Medical History:  Diagnosis Date   (HFpEF) heart failure with preserved ejection fraction ([place])    a. 2018 Echo: EF 60-65%, normal wall motion, Gr1DD, mild aortic valve calcification; b. 12/2017 Echo: EF 60-65%, no rwma, Ao root 4.3cm, Asc Ao 4.4 cm, mildly dil LA.   Agent orange exposure    Anginal pain ([place])    Arthritis    CAD (coronary artery disease)    a. s/p PCI/DES to the LCx and RCA in 2014; b. 05/2016 MV: EF 55-65%. Small, mild defect in basal anterolateral location->infarct w/o ischemia.   Cancer ([place])    skin   Chronic cough    Chronic pain    Colon polyp    Depression    Diabetes mellitus without complication (HCC)     Diverticulosis    GERD (gastroesophageal reflux disease)    History of kidney stones    History of stomach ulcers    HOH (hard of hearing)    Hx of laminectomy 1999   Hyperlipidemia    Hypertension    Myocardial infarction [facility])    Sleep apnea    Syncope    a. Event monitor 2018: NSR with first-degree AV block, no significant arrhythmias or pauses; b.  Myoview showed a small in size, mild in severity fixed basal and anterior lateral defect, no ischemia, LVEF 55 to 65%, low risk study   Thoracic aortic aneurysm ([place])    a. TTE 2018: Mildly dil Ao root- 4.2 cm; b. CTA aorta 6/18: Asc Ao aneurysmal dil w max diam of 4.3 cm; b. 12/2019 CTA Chest: stable 4.3cm thor Ao Aneurysm.   Ulcer    Past Surgical History:  Procedure Laterality Date   [place] Bilateral 1990   CATARACT EXTRACTION W/PHACO Left 03/08/2018   Procedure: CATARACT EXTRACTION PHACO AND INTRAOCULAR LENS PLACEMENT ([place]) LEFT;  Surgeon: Birder Robson, MD;  Location: [facility] ORS;  Service: Ophthalmology;  Laterality: Left;  [place]  00:24.2 CDE 3.25 Fluid pack lot # [id number]   CATARACT EXTRACTION W/PHACO Right 05/24/2018   Procedure: CATARACT EXTRACTION PHACO AND INTRAOCULAR LENS PLACEMENT ([facility]) RIGHT;  Surgeon: Birder Robson, MD;  Location: [facility];  Service: Ophthalmology;  Laterality: Right;  Diabetic - oral meds   CERVICAL FUSION  2000. 2003   CHOLECYSTECTOMY  2000   COLONOSCOPY WITH  PROPOFOL N/A 02/23/2017   Procedure: COLONOSCOPY WITH PROPOFOL;  Surgeon: Lollie Sails, MD;  Location: [facility] ENDOSCOPY;  Service: Endoscopy;  Laterality: N/A;   CORONARY ANGIOPLASTY WITH STENT PLACEMENT  2014   [facility]   ESOPHAGOGASTRODUODENOSCOPY (EGD) WITH PROPOFOL N/A 02/23/2017   Procedure: ESOPHAGOGASTRODUODENOSCOPY (EGD) WITH PROPOFOL;  Surgeon: Lollie Sails, MD;  Location: [facility] ENDOSCOPY;  Service: Endoscopy;  Laterality: N/A;    PULSE GENERATOR IMPLANT N/A 03/04/2020   Procedure: PULSE GENERATOR PLACEMENT;  Surgeon: Deetta Perla, MD;  Location: [facility] ORS;  Service: Neurosurgery;  Laterality: N/A;  1ST CASE   SHOULDER ARTHROSCOPY Right 2012   THORACIC LAMINECTOMY FOR SPINAL CORD STIMULATOR N/A 02/26/2020   Procedure: THORACIC LAMINECTOMY FOR SPINAL CORD STIMULATOR PADDLE TRIAL;  Surgeon: Deetta Perla, MD;  Location: [facility] ORS;  Service: Neurosurgery;  Laterality: N/A;  1ST CASE   TUMOR EXCISION  2007     Current Meds  Medication Sig   amLODipine (NORVASC) 2.5 MG tablet TAKE ONE TABLET BY MOUTH EVERY DAY   ASPIRIN 81 PO TAKE 81 MG BY MOUTH ONCE EVERY DAY   atorvastatin (LIPITOR) 80 MG tablet Take 80 mg by mouth at bedtime.   chlorthalidone (HYGROTON) 50 MG tablet Take 25 mg by mouth daily.   cholecalciferol (VITAMIN D) 1000 UNITS tablet Take 1 tablet (1,000 Units total) by mouth QID.   CHOLECALCIFEROL PO Take 4,000 Int'l Units/1.39m2 by mouth daily.   DULoxetine (CYMBALTA) 20 MG capsule Take 20 mg by mouth daily.   fenofibrate (TRICOR) 145 MG tablet Take 1 tablet (145 mg total) by mouth daily.   glucose blood test strip Check once daily, E11.9   lidocaine (LIDODERM) 5 % 1 patch daily as needed-Remove & Discard patch within 12 hours or as directed by MD   losartan (COZAAR) 25 MG tablet Take 25 mg by mouth daily.   metFORMIN (GLUCOPHAGE) 1000 MG tablet Take 1,000 mg by mouth 2 (two) times daily with a meal.   methyl salicylate liquid Apply 1 application topically as needed for muscle pain.   nitroGLYCERIN (NITROSTAT) 0.4 MG SL tablet Place 1 tablet (0.4 mg total) under the tongue every 5 (five) minutes as needed for chest pain. May take up to 3 doses.   omeprazole (PRILOSEC) 20 MG capsule Take 20 mg by mouth daily.   polyvinyl alcohol (LIQUIFILM TEARS) 1.4 % ophthalmic solution Place 1 drop into both eyes as needed for dry eyes.    potassium chloride SA (K-DUR,KLOR-CON) 20 MEQ tablet Take 1 tablet (20 mEq total) by mouth  daily.   pregabalin (LYRICA) 50 MG capsule Take 1 capsule (50 mg total) by mouth 3 (three) times daily.   Semaglutide, 1 MG/DOSE, (OZEMPIC, 1 MG/DOSE,) 4 MG/3ML SOPN INJECT 0.75 ML (1 MG) INTO THE SKIN ONCEA WEEK   tamsulosin (FLOMAX) 0.4 MG CAPS capsule TAKE 1 CAPSULE BY MOUTH ONCE DAILY   traMADol (ULTRAM) 50 MG tablet TAKE TWO TABLETS BY MOUTH EVERY 8 HOURS AS NEEDED FOR MODERATE PAIN   VASCEPA 1 g capsule TAKE 2 CAPSULES BY MOUTH TWICE DAILY   vitamin B-12 (CYANOCOBALAMIN) 1000 MCG tablet Take 1 tablet (1,000 mcg total) by mouth daily.    Allergies: Gabapentin, Isosorbide mononitrate er [isosorbide dinitrate], Lisinopril, and Tylenol [acetaminophen]  Social History   Tobacco Use   Smoking status: Every Day    Packs/day: 1.00    Years: 50.00    Pack years: 50.00    Types: Cigarettes    Last attempt  to quit: 02/15/2018    Years since quitting: 2.3   Smokeless tobacco: Never  Vaping Use   Vaping Use: Never used  Substance Use Topics   Alcohol use: No   Drug use: No    Family History  Problem Relation Age of Onset   Diabetes Mother    Heart disease Father    Diabetes Father    Diabetes Sister    Heart disease Sister    Diabetes Brother    Heart disease Brother    Heart disease Paternal Grandfather    Cancer Neg Hx     Review of Systems: A 12-system review of systems was performed and was negative except as noted in the HPI.  --------------------------------------------------------------------------------------------------  Physical Exam: BP 120/66 (BP Location: Left Arm, Patient Position: Sitting, Cuff Size: Large)   Pulse (!) 55   Ht 5\' 10"  (1.778 m)   Wt 221 lb (100.2 kg)   SpO2 96%   BMI 31.71 kg/m   General:  NAD. Neck: No JVD or HJR. Lungs: Clear to auscultation bilaterally without wheezes or crackles. Heart: Regular rate and rhythm with 2/6 systolic murmur Abdomen: Soft, nontender, nondistended. Extremities: No lower extremity edema.  2+ pedal pulses  bilaterally.  EKG:  Normal sinus rhythm with 1st degree AV block (PR interval 242 ms) and low voltage.  No significant change from prior tracing on 12/29/2019.  Lab Results  Component Value Date   WBC 6.1 04/03/2020   HGB 14.2 04/03/2020   HCT 42.1 04/03/2020   MCV 83.2 04/03/2020   PLT 189 04/03/2020    Lab Results  Component Value Date   NA 134 (L) 04/03/2020   K 3.6 04/03/2020   CL 98 04/03/2020   CO2 26 04/03/2020   BUN 18 04/03/2020   CREATININE 0.87 04/03/2020   GLUCOSE 278 (H) 04/03/2020   ALT 20 04/03/2020    Lab Results  Component Value Date   CHOL 159 08/07/2019   HDL 25.90 (L) 08/07/2019   LDLCALC 73 10/18/2017   LDLDIRECT 62.0 08/07/2019   TRIG (H) 08/07/2019    525.0 Triglyceride is over 400; calculations on Lipids are invalid.   CHOLHDL 6 08/07/2019    --------------------------------------------------------------------------------------------------  ASSESSMENT AND PLAN: Coronary artery disease: No angina reported.  Continue current medications for secondary prevention and tobacco cessation.  Thoracic aortic aneurysm: No new symptoms reported.  Mildly dilated ascending aorta stable on last CTA in 12/2019.  We will plan to repeat CTA in 12/2020 and follow-up in the office shortly thereafter.  Mixed hyperlipidemia associated with type 2 diabetes mellitus: Most recent lipid panel in 07/2019 showed reasonable LDL control but continued significant hypertriglyceridemia.  We will plan to continue current regimen of atorvastatin, fenofibrate, and Vascepa.  Walter Salas is scheduled for follow-up labs next month with Dr. Caryl Bis.  Hypertension: Blood pressure well controlled today.  No medication changes at this time.  Follow-up: Return to clinic in 6 months after follow-up CTA chest.  Nelva Bush, MD 07/04/2020 8:21 AM

## 2020-07-17 ENCOUNTER — Other Ambulatory Visit: Payer: Self-pay | Admitting: Family Medicine

## 2020-07-17 ENCOUNTER — Ambulatory Visit: Payer: Medicare Other | Admitting: Family Medicine

## 2020-07-17 DIAGNOSIS — M5442 Lumbago with sciatica, left side: Secondary | ICD-10-CM

## 2020-07-18 NOTE — Telephone Encounter (Signed)
RX Refill:tramadol Last Seen:01-15-20 Last ordered:06-24-20

## 2020-07-19 ENCOUNTER — Ambulatory Visit: Payer: Medicare Other | Admitting: Family Medicine

## 2020-07-22 ENCOUNTER — Telehealth: Payer: Self-pay | Admitting: Family Medicine

## 2020-07-22 ENCOUNTER — Encounter: Payer: Self-pay | Admitting: Family Medicine

## 2020-07-22 ENCOUNTER — Other Ambulatory Visit: Payer: Self-pay

## 2020-07-22 ENCOUNTER — Ambulatory Visit (INDEPENDENT_AMBULATORY_CARE_PROVIDER_SITE_OTHER): Payer: Medicare Other | Admitting: Family Medicine

## 2020-07-22 DIAGNOSIS — R198 Other specified symptoms and signs involving the digestive system and abdomen: Secondary | ICD-10-CM

## 2020-07-22 DIAGNOSIS — F17210 Nicotine dependence, cigarettes, uncomplicated: Secondary | ICD-10-CM

## 2020-07-22 DIAGNOSIS — I10 Essential (primary) hypertension: Secondary | ICD-10-CM

## 2020-07-22 DIAGNOSIS — I2510 Atherosclerotic heart disease of native coronary artery without angina pectoris: Secondary | ICD-10-CM | POA: Diagnosis not present

## 2020-07-22 DIAGNOSIS — I700 Atherosclerosis of aorta: Secondary | ICD-10-CM | POA: Diagnosis not present

## 2020-07-22 DIAGNOSIS — E119 Type 2 diabetes mellitus without complications: Secondary | ICD-10-CM

## 2020-07-22 DIAGNOSIS — M545 Low back pain, unspecified: Secondary | ICD-10-CM

## 2020-07-22 DIAGNOSIS — A048 Other specified bacterial intestinal infections: Secondary | ICD-10-CM

## 2020-07-22 DIAGNOSIS — G8929 Other chronic pain: Secondary | ICD-10-CM

## 2020-07-22 LAB — LIPID PANEL
Cholesterol: 187 mg/dL (ref 0–200)
HDL: 30.7 mg/dL — ABNORMAL LOW (ref >39.00)
NonHDL: 156.02
Total CHOL/HDL Ratio: 6
Triglycerides: 381 mg/dL — ABNORMAL HIGH (ref 0.0–149.0)
VLDL: 76.2 mg/dL — ABNORMAL HIGH (ref 0.0–40.0)

## 2020-07-22 LAB — BASIC METABOLIC PANEL
BUN: 17 mg/dL (ref 6–23)
CO2: 26 mEq/L (ref 19–32)
Calcium: 9.8 mg/dL (ref 8.4–10.5)
Chloride: 95 mEq/L — ABNORMAL LOW (ref 96–112)
Creatinine, Ser: 0.67 mg/dL (ref 0.40–1.50)
GFR: 97.16 mL/min (ref >60.00)
Glucose, Bld: 152 mg/dL — ABNORMAL HIGH (ref 70–99)
Potassium: 4.1 mEq/L (ref 3.5–5.1)
Sodium: 132 mEq/L — ABNORMAL LOW (ref 135–145)

## 2020-07-22 LAB — LDL CHOLESTEROL, DIRECT: Direct LDL: 97 mg/dL

## 2020-07-22 LAB — HEMOGLOBIN A1C: Hgb A1c MFr Bld: 7.1 % — ABNORMAL HIGH (ref 4.6–6.5)

## 2020-07-22 NOTE — Telephone Encounter (Signed)
Please call the patient.  Please let him know I was reviewing his chart after he left and noted that he needed to have a follow-up endoscopy due to findings on his endoscopy from 2019 that included Barrett's esophagus.  Has he had this completed through [facility] this year?  If he has not I would like to refer him back to them.  Thanks.

## 2020-07-22 NOTE — Assessment & Plan Note (Signed)
Continue risk factor management. 

## 2020-07-22 NOTE — Assessment & Plan Note (Signed)
It was noted after the patient left that he was due for follow-up EGD.  I will have the CMA contact the patient to see if this has been completed.  If it has not yet been completed this year we will refer back to GI.

## 2020-07-22 NOTE — Assessment & Plan Note (Signed)
Chronic issue.  Much improved with the spinal cord stimulator in place.  He can continue on his as needed tramadol.  Advised monitor for drowsiness and not drive taking this medication.  He will also continue on Lyrica and Cymbalta.

## 2020-07-22 NOTE — Assessment & Plan Note (Signed)
Well-controlled.  He will continue losartan 25 mg daily, chlorthalidone 25 mg daily, and amlodipine 2.5 mg daily.  Check BMP.

## 2020-07-22 NOTE — Patient Instructions (Signed)
Nice to see you. We will get lab work today and contact you with the results. 

## 2020-07-22 NOTE — Assessment & Plan Note (Addendum)
I encourage smoking cessation.  The patient declines wanting to do this at this time.  I advised if he ever wanted to quit he could let us know if he wanted help.  Patient has a yearly CT angio chest for his aorta that can evaluate for lung nodules as well.  We will continue to monitor that imaging with cardiology.

## 2020-07-22 NOTE — Progress Notes (Signed)
Tommi Rumps, MD Phone: [phone]  Walter Salas is a 65 y.o. male who presents today for f/u.  HYPERTENSION/CAD/aortic atherosclerosis Disease Monitoring Home BP Monitoring 120/60 Chest pain- no    Dyspnea- no Medications Compliance-  taking losartan, chlorthalidone, amlodipine, vascepa, lipitor (gets through the [place]). Lightheadedness-  no  Edema- no  DIABETES Disease Monitoring: Blood Sugar ranges-110-138 Polyuria/phagia/dipsia- no      Optho- UTD Medications: Compliance- taking ozempic, metformin Hypoglycemic symptoms- no  Chronic pain (back and hips): Patient notes these things are well controlled at this time.  He had a spinal cord stimulator placed and this helped significantly with his hip pain.  This has let him sleep much better than he was previously.  He notes now his pain is 5/10.  Previously his pain was significant.  He takes tramadol twice daily.  He notes minimal drowsiness with this though he does not drive with it.  No alcohol intake.  He continues on Cymbalta and Lyrica.  Social History   Tobacco Use  Smoking Status Every Day   Packs/day: 1.00   Years: 50.00   Pack years: 50.00   Types: Cigarettes   Last attempt to quit: 02/15/2018   Years since quitting: 2.4  Smokeless Tobacco Never    Current Outpatient Medications on File Prior to Visit  Medication Sig Dispense Refill   amLODipine (NORVASC) 2.5 MG tablet TAKE ONE TABLET BY MOUTH EVERY DAY 90 tablet 0   ASPIRIN 81 PO TAKE 81 MG BY MOUTH ONCE EVERY DAY     atorvastatin (LIPITOR) 80 MG tablet Take 80 mg by mouth at bedtime.     chlorthalidone (HYGROTON) 50 MG tablet Take 25 mg by mouth daily.     cholecalciferol (VITAMIN D) 1000 UNITS tablet Take 1 tablet (1,000 Units total) by mouth QID. 90 tablet 0   CHOLECALCIFEROL PO Take 4,000 Int'l Units/1.5m2 by mouth daily.     DULoxetine (CYMBALTA) 20 MG capsule Take 20 mg by mouth daily.     fenofibrate (TRICOR) 145 MG tablet Take 1 tablet (145 mg total) by  mouth daily. 30 tablet 0   glucose blood test strip Check once daily, E11.9 100 each 12   lidocaine (LIDODERM) 5 % 1 patch daily as needed-Remove & Discard patch within 12 hours or as directed by MD     losartan (COZAAR) 25 MG tablet Take 25 mg by mouth daily.     metFORMIN (GLUCOPHAGE) 1000 MG tablet Take 1,000 mg by mouth 2 (two) times daily with a meal.     methyl salicylate liquid Apply 1 application topically as needed for muscle pain.     nitroGLYCERIN (NITROSTAT) 0.4 MG SL tablet Place 1 tablet (0.4 mg total) under the tongue every 5 (five) minutes as needed for chest pain. May take up to 3 doses. 30 tablet 0   omeprazole (PRILOSEC) 20 MG capsule Take 20 mg by mouth daily.     polyvinyl alcohol (LIQUIFILM TEARS) 1.4 % ophthalmic solution Place 1 drop into both eyes as needed for dry eyes.      potassium chloride SA (K-DUR,KLOR-CON) 20 MEQ tablet Take 1 tablet (20 mEq total) by mouth daily. 90 tablet 3   pregabalin (LYRICA) 50 MG capsule Take 1 capsule (50 mg total) by mouth 3 (three) times daily. 30 capsule 0   Semaglutide, 1 MG/DOSE, (OZEMPIC, 1 MG/DOSE,) 4 MG/3ML SOPN INJECT 0.75 ML (1 MG) INTO THE SKIN ONCEA WEEK 15 mL 1   tamsulosin (FLOMAX) 0.4 MG CAPS capsule TAKE  1 CAPSULE BY MOUTH ONCE DAILY 30 capsule 3   traMADol (ULTRAM) 50 MG tablet TAKE TWO TABLETS BY MOUTH EVERY 8 HOURS AS NEEDED FOR MODERATE PAIN 90 tablet 0   VASCEPA 1 g capsule TAKE 2 CAPSULES BY MOUTH TWICE DAILY 120 capsule 3   vitamin B-12 (CYANOCOBALAMIN) 1000 MCG tablet Take 1 tablet (1,000 mcg total) by mouth daily. 30 tablet 0   No current facility-administered medications on file prior to visit.     ROS see history of present illness  Objective  Physical Exam Vitals:   07/22/20 0759  BP: 124/60  Pulse: 95  Temp: (!) 96.7 F (35.9 C)  SpO2: 96%    BP Readings from Last 3 Encounters:  07/22/20 124/60  07/04/20 120/66  04/11/20 121/68   Wt Readings from Last 3 Encounters:  07/22/20 218 lb 9.6 oz  (99.2 kg)  07/04/20 221 lb (100.2 kg)  04/11/20 219 lb 9.6 oz (99.6 kg)    Physical Exam Constitutional:      General: He is not in acute distress.    Appearance: He is not diaphoretic.  Cardiovascular:     Rate and Rhythm: Normal rate and regular rhythm.     Heart sounds: Normal heart sounds.  Pulmonary:     Effort: Pulmonary effort is normal.     Breath sounds: Normal breath sounds.  Musculoskeletal:     Right lower leg: No edema.     Left lower leg: No edema.  Skin:    General: Skin is warm and dry.  Neurological:     Mental Status: He is alert.     Assessment/Plan: Please see individual problem list.  Problem List Items Addressed This Visit     Chronic back pain (Chronic)    Chronic issue.  Much improved with the spinal cord stimulator in place.  He can continue on his as needed tramadol.  Advised monitor for drowsiness and not drive taking this medication.  He will also continue on Lyrica and Cymbalta.       Coronary artery disease (Chronic)    Asymptomatic.  He will continue Vascepa 2 g twice daily, Lipitor 80 mg once daily, aspirin 81 mg daily, losartan, and amlodipine.       Relevant Orders   Lipid panel   Diabetes mellitus, type 2 ([place]) (Chronic)    Seems to be well controlled.  We will check an A1c.  He will continue Ozempic 1 mg once weekly and metformin 1000 mg twice daily.       Relevant Orders   HgB A1c   Essential hypertension (Chronic)    Well-controlled.  He will continue losartan 25 mg daily, chlorthalidone 25 mg daily, and amlodipine 2.5 mg daily.  Check BMP.       Relevant Orders   Basic Metabolic Panel (BMET)   Aortic atherosclerosis ([place])    Continue risk factor management.       Relevant Orders   Lipid panel   H. pylori infection    It was noted after the patient left that he was due for follow-up EGD.  I will have the CMA contact the patient to see if this has been completed.  If it has not yet been completed this year we will  refer back to GI.       Nicotine dependence, cigarettes, uncomplicated    I encourage smoking cessation.  The patient declines wanting to do this at this time.  I advised if he ever wanted to quit he  could let us know if he wanted help.  Patient has a yearly CT angio chest for his aorta that can evaluate for lung nodules as well.  We will continue to monitor that imaging with cardiology.        Return in about 6 months (around 01/22/2021) for Diabetes, hypertension.  This visit occurred during the SARS-CoV-2 public health emergency.  Safety protocols were in place, including screening questions prior to the visit, additional usage of staff PPE, and extensive cleaning of exam room while observing appropriate contact time as indicated for disinfecting solutions.    Tommi Rumps, MD [place]

## 2020-07-22 NOTE — Assessment & Plan Note (Signed)
Seems to be well controlled.  We will check an A1c.  He will continue Ozempic 1 mg once weekly and metformin 1000 mg twice daily.

## 2020-07-22 NOTE — Assessment & Plan Note (Signed)
Asymptomatic.  He will continue Vascepa 2 g twice daily, Lipitor 80 mg once daily, aspirin 81 mg daily, losartan, and amlodipine.

## 2020-07-24 NOTE — Telephone Encounter (Signed)
Noted. He really should have this completed as the area they were monitoring does have the potential to develop cancer which is why they like to monitor it periodically. If he understands that and still does not want to complete this now then that would be up to him. Thanks.

## 2020-07-25 NOTE — Telephone Encounter (Signed)
Called and LM with patients wife for patient to call back.  Xiomara Sevillano,cma

## 2020-07-25 NOTE — Telephone Encounter (Signed)
Referral placed.

## 2020-07-25 NOTE — Addendum Note (Signed)
Addended by: Caryl Bis Pradyun Ishman G on: 07/25/2020 11:48 AM   Modules accepted: Orders

## 2020-08-04 ENCOUNTER — Other Ambulatory Visit: Payer: Self-pay | Admitting: Family Medicine

## 2020-08-04 DIAGNOSIS — E871 Hypo-osmolality and hyponatremia: Secondary | ICD-10-CM

## 2020-08-07 ENCOUNTER — Telehealth: Payer: Self-pay

## 2020-08-07 NOTE — Telephone Encounter (Signed)
Pt returned your call about labs 

## 2020-08-08 ENCOUNTER — Other Ambulatory Visit: Payer: Self-pay | Admitting: Family Medicine

## 2020-08-08 DIAGNOSIS — M5442 Lumbago with sciatica, left side: Secondary | ICD-10-CM

## 2020-08-08 NOTE — Telephone Encounter (Signed)
RX Refill:tramadol Last Seen:07-22-20 Last ordered:07-19-20

## 2020-08-09 NOTE — Telephone Encounter (Signed)
See result note.  

## 2020-08-12 ENCOUNTER — Other Ambulatory Visit (INDEPENDENT_AMBULATORY_CARE_PROVIDER_SITE_OTHER): Payer: Medicare Other

## 2020-08-12 ENCOUNTER — Other Ambulatory Visit: Payer: Self-pay

## 2020-08-12 DIAGNOSIS — E871 Hypo-osmolality and hyponatremia: Secondary | ICD-10-CM

## 2020-08-12 LAB — BASIC METABOLIC PANEL
BUN: 17 mg/dL (ref 6–23)
CO2: 29 mEq/L (ref 19–32)
Calcium: 9.2 mg/dL (ref 8.4–10.5)
Chloride: 97 mEq/L (ref 96–112)
Creatinine, Ser: 0.76 mg/dL (ref 0.40–1.50)
GFR: 93.5 mL/min (ref >60.00)
Glucose, Bld: 162 mg/dL — ABNORMAL HIGH (ref 70–99)
Potassium: 3.5 mEq/L (ref 3.5–5.1)
Sodium: 136 mEq/L (ref 135–145)

## 2020-09-02 ENCOUNTER — Other Ambulatory Visit: Payer: Self-pay | Admitting: Family Medicine

## 2020-09-02 DIAGNOSIS — M5442 Lumbago with sciatica, left side: Secondary | ICD-10-CM

## 2020-09-23 ENCOUNTER — Other Ambulatory Visit: Payer: Self-pay | Admitting: Family Medicine

## 2020-09-23 ENCOUNTER — Encounter: Payer: Self-pay | Admitting: Family Medicine

## 2020-09-23 DIAGNOSIS — M5442 Lumbago with sciatica, left side: Secondary | ICD-10-CM

## 2020-09-24 NOTE — Telephone Encounter (Signed)
Last OV 07/22/20 last fill 09/03/20 next appt scheduled 02/07/21

## 2020-09-25 ENCOUNTER — Other Ambulatory Visit: Payer: Self-pay | Admitting: Family Medicine

## 2020-10-10 DIAGNOSIS — A048 Other specified bacterial intestinal infections: Secondary | ICD-10-CM | POA: Diagnosis not present

## 2020-10-10 DIAGNOSIS — K2270 Barrett's esophagus without dysplasia: Secondary | ICD-10-CM | POA: Diagnosis not present

## 2020-10-17 ENCOUNTER — Other Ambulatory Visit: Payer: Self-pay | Admitting: Family Medicine

## 2020-10-17 DIAGNOSIS — M5442 Lumbago with sciatica, left side: Secondary | ICD-10-CM

## 2020-11-02 ENCOUNTER — Other Ambulatory Visit: Payer: Self-pay | Admitting: Internal Medicine

## 2020-11-02 ENCOUNTER — Other Ambulatory Visit: Payer: Self-pay | Admitting: Family Medicine

## 2020-11-05 ENCOUNTER — Other Ambulatory Visit: Payer: Self-pay | Admitting: Family Medicine

## 2020-11-05 DIAGNOSIS — M5442 Lumbago with sciatica, left side: Secondary | ICD-10-CM

## 2020-12-04 ENCOUNTER — Other Ambulatory Visit: Payer: Self-pay | Admitting: Family Medicine

## 2020-12-04 DIAGNOSIS — M5442 Lumbago with sciatica, left side: Secondary | ICD-10-CM

## 2020-12-04 NOTE — Telephone Encounter (Signed)
RX Refill: tramadol Last Seen: 07-22-20 Last Ordered: 11-07-20 Next Appt: 02-07-21

## 2020-12-19 DIAGNOSIS — K31A11 Gastric intestinal metaplasia without dysplasia, involving the antrum: Secondary | ICD-10-CM | POA: Diagnosis not present

## 2020-12-19 DIAGNOSIS — K2289 Other specified disease of esophagus: Secondary | ICD-10-CM | POA: Diagnosis not present

## 2020-12-19 DIAGNOSIS — K2270 Barrett's esophagus without dysplasia: Secondary | ICD-10-CM | POA: Diagnosis not present

## 2020-12-19 DIAGNOSIS — K31A19 Gastric intestinal metaplasia without dysplasia, unspecified site: Secondary | ICD-10-CM | POA: Diagnosis not present

## 2020-12-19 DIAGNOSIS — K2970 Gastritis, unspecified, without bleeding: Secondary | ICD-10-CM | POA: Diagnosis not present

## 2020-12-24 ENCOUNTER — Other Ambulatory Visit: Payer: Self-pay | Admitting: Family Medicine

## 2020-12-24 DIAGNOSIS — M5442 Lumbago with sciatica, left side: Secondary | ICD-10-CM

## 2020-12-24 NOTE — Telephone Encounter (Signed)
RX Refill: tramadol Last Seen: 07-22-20 Last Ordered: 12-04-20 Next Appt: 02-07-21

## 2020-12-26 ENCOUNTER — Ambulatory Visit
Admission: RE | Admit: 2020-12-26 | Discharge: 2020-12-26 | Disposition: A | Discharge status: Home / Self Care | Payer: Medicare Other | Source: Ambulatory Visit | Attending: Internal Medicine | Admitting: Internal Medicine

## 2020-12-26 ENCOUNTER — Other Ambulatory Visit: Payer: Self-pay

## 2020-12-26 DIAGNOSIS — I712 Thoracic aortic aneurysm, without rupture, unspecified: Secondary | ICD-10-CM | POA: Insufficient documentation

## 2020-12-26 DIAGNOSIS — J8410 Pulmonary fibrosis, unspecified: Secondary | ICD-10-CM | POA: Diagnosis not present

## 2020-12-26 DIAGNOSIS — I700 Atherosclerosis of aorta: Secondary | ICD-10-CM | POA: Diagnosis not present

## 2020-12-26 DIAGNOSIS — N281 Cyst of kidney, acquired: Secondary | ICD-10-CM | POA: Diagnosis not present

## 2020-12-26 DIAGNOSIS — E041 Nontoxic single thyroid nodule: Secondary | ICD-10-CM | POA: Diagnosis not present

## 2020-12-26 DIAGNOSIS — I7121 Aneurysm of the ascending aorta, without rupture: Secondary | ICD-10-CM | POA: Diagnosis not present

## 2020-12-26 LAB — POCT I-STAT CREATININE: Creatinine, Ser: 0.9 mg/dL (ref 0.61–1.24)

## 2020-12-26 MED ORDER — IOHEXOL 350 MG/ML SOLN
75.0000 mL | Freq: Once | INTRAVENOUS | Status: AC | PRN
  Administered 2020-12-26: 75 mL via INTRAVENOUS

## 2021-01-17 ENCOUNTER — Other Ambulatory Visit: Payer: Self-pay | Admitting: Family Medicine

## 2021-01-17 DIAGNOSIS — M5442 Lumbago with sciatica, left side: Secondary | ICD-10-CM

## 2021-01-22 ENCOUNTER — Ambulatory Visit: Payer: Medicare Other | Admitting: Family Medicine

## 2021-01-22 ENCOUNTER — Other Ambulatory Visit: Payer: Self-pay

## 2021-01-22 ENCOUNTER — Ambulatory Visit: Payer: Medicare Other

## 2021-01-22 ENCOUNTER — Ambulatory Visit (INDEPENDENT_AMBULATORY_CARE_PROVIDER_SITE_OTHER): Payer: Medicare Other

## 2021-01-22 VITALS — BP 127/73 | HR 73 | Temp 96.1°F | Resp 16 | Ht 70.0 in | Wt 221.6 lb

## 2021-01-22 DIAGNOSIS — Z0000 Encounter for general adult medical examination without abnormal findings: Secondary | ICD-10-CM | POA: Diagnosis not present

## 2021-01-22 NOTE — Progress Notes (Signed)
Subjective:   Walter Salas is a 66 y.o. male who presents for Medicare Annual/Subsequent preventive examination.  Review of Systems    No ROS.  Medicare Wellness Virtual Visit.    Cardiac Risk Factors include: advanced age (>76men, >79 women);male gender;diabetes mellitus;hypertension     Objective:    Today's Vitals   01/22/21 0951  BP: 127/73  Pulse: 73  Resp: 16  Temp: (!) 96.1 F (35.6 C)  SpO2: 95%  Weight: 221 lb 9.6 oz (100.5 kg)  Height: 5\' 10"  (1.778 m)   Body mass index is 31.8 kg/m.  Advanced Directives 01/22/2021 04/11/2020 03/04/2020 02/26/2020 02/19/2020 01/22/2020 10/02/2019  Does Patient Have a Medical Advance Directive? Yes Yes No Yes Yes Yes Yes  Type of Paramedic of [place];Living will [place];Living will - [place];Living will [place];Living will [place];Living will Living will;Healthcare Power of Attorney  Does patient want to make changes to medical advance directive? No - Patient declined - - No - Patient declined No - Patient declined No - Patient declined -  Copy of [place] in Chart? No - copy requested - - No - copy requested No - copy requested No - copy requested -  Would patient like information on creating a medical advance directive? - - No - Patient declined - - - -    Current Medications (verified) Outpatient Encounter Medications as of 01/22/2021  Medication Sig   amLODipine (NORVASC) 2.5 MG tablet TAKE ONE TABLET BY MOUTH EVERY DAY   ASPIRIN 81 PO TAKE 81 MG BY MOUTH ONCE EVERY DAY   atorvastatin (LIPITOR) 80 MG tablet Take 80 mg by mouth at bedtime.   chlorthalidone (HYGROTON) 50 MG tablet Take 25 mg by mouth daily.   cholecalciferol (VITAMIN D) 1000 UNITS tablet Take 1 tablet (1,000 Units total) by mouth QID.   CHOLECALCIFEROL PO Take 4,000 Int'l Units/1.66m2 by mouth daily.   DULoxetine (CYMBALTA) 20 MG capsule  Take 20 mg by mouth daily.   fenofibrate (TRICOR) 145 MG tablet Take 1 tablet (145 mg total) by mouth daily.   glucose blood test strip Check once daily, E11.9   lidocaine (LIDODERM) 5 % 1 patch daily as needed-Remove & Discard patch within 12 hours or as directed by MD   losartan (COZAAR) 25 MG tablet Take 25 mg by mouth daily.   metFORMIN (GLUCOPHAGE) 1000 MG tablet Take 1,000 mg by mouth 2 (two) times daily with a meal.   methyl salicylate liquid Apply 1 application topically as needed for muscle pain.   nitroGLYCERIN (NITROSTAT) 0.4 MG SL tablet Place 1 tablet (0.4 mg total) under the tongue every 5 (five) minutes as needed for chest pain. May take up to 3 doses.   omeprazole (PRILOSEC) 20 MG capsule Take 20 mg by mouth daily.   polyvinyl alcohol (LIQUIFILM TEARS) 1.4 % ophthalmic solution Place 1 drop into both eyes as needed for dry eyes.    potassium chloride SA (K-DUR,KLOR-CON) 20 MEQ tablet Take 1 tablet (20 mEq total) by mouth daily.   pregabalin (LYRICA) 50 MG capsule Take 1 capsule (50 mg total) by mouth 3 (three) times daily.   Semaglutide, 1 MG/DOSE, (OZEMPIC, 1 MG/DOSE,) 4 MG/3ML SOPN INJECT 0.75 ML (1 MG) INTO THE SKIN ONCEA WEEK   tamsulosin (FLOMAX) 0.4 MG CAPS capsule TAKE 1 CAPSULE BY MOUTH ONCE DAILY   traMADol (ULTRAM) 50 MG tablet TAKE TWO TABLETS BY MOUTH EVERY  8 HOURS AS NEEDED FOR MODERATE PAIN   VASCEPA 1 g capsule TAKE 2 CAPSULES BY MOUTH TWICE DAILY   vitamin B-12 (CYANOCOBALAMIN) 1000 MCG tablet Take 1 tablet (1,000 mcg total) by mouth daily.   No facility-administered encounter medications on file as of 01/22/2021.    Allergies (verified) Gabapentin, Isosorbide mononitrate er [isosorbide dinitrate], Lisinopril, and Tylenol [acetaminophen]   History: Past Medical History:  Diagnosis Date   (HFpEF) heart failure with preserved ejection fraction ([place])    a. 2018 Echo: EF 60-65%, normal wall motion, Gr1DD, mild aortic valve calcification; b. 12/2017 Echo: EF  60-65%, no rwma, Ao root 4.3cm, Asc Ao 4.4 cm, mildly dil LA.   Agent orange exposure    Anginal pain ([place])    Arthritis    CAD (coronary artery disease)    a. s/p PCI/DES to the LCx and RCA in 2014; b. 05/2016 MV: EF 55-65%. Small, mild defect in basal anterolateral location->infarct w/o ischemia.   Cancer ([place])    skin   Chronic cough    Chronic pain    Colon polyp    Depression    Diabetes mellitus without complication (HCC)    Diverticulosis    GERD (gastroesophageal reflux disease)    History of kidney stones    History of stomach ulcers    History of subdural hematoma 12/14/2012   HOH (hard of hearing)    Hx of laminectomy 1999   Hyperlipidemia    Hypertension    Myocardial infarction [facility])    Sleep apnea    Syncope    a. Event monitor 2018: NSR with first-degree AV block, no significant arrhythmias or pauses; b.  Myoview showed a small in size, mild in severity fixed basal and anterior lateral defect, no ischemia, LVEF 55 to 65%, low risk study   Thoracic aortic aneurysm    a. TTE 2018: Mildly dil Ao root- 4.2 cm; b. CTA aorta 6/18: Asc Ao aneurysmal dil w max diam of 4.3 cm; b. 12/2019 CTA Chest: stable 4.3cm thor Ao Aneurysm.   Ulcer    Past Surgical History:  Procedure Laterality Date   [place] Bilateral 1990   CATARACT EXTRACTION W/PHACO Left 03/08/2018   Procedure: CATARACT EXTRACTION PHACO AND INTRAOCULAR LENS PLACEMENT ([place]) LEFT;  Surgeon: Birder Robson, MD;  Location: [facility] ORS;  Service: Ophthalmology;  Laterality: Left;  [place]  00:24.2 CDE 3.25 Fluid pack lot # [id number]   CATARACT EXTRACTION W/PHACO Right 05/24/2018   Procedure: CATARACT EXTRACTION PHACO AND INTRAOCULAR LENS PLACEMENT ([facility]) RIGHT;  Surgeon: Birder Robson, MD;  Location: [place];  Service: Ophthalmology;  Laterality: Right;  Diabetic - oral meds   CERVICAL FUSION  2000. 2003   CHOLECYSTECTOMY  2000   COLONOSCOPY WITH PROPOFOL  N/A 02/23/2017   Procedure: COLONOSCOPY WITH PROPOFOL;  Surgeon: Lollie Sails, MD;  Location: [facility] ENDOSCOPY;  Service: Endoscopy;  Laterality: N/A;   CORONARY ANGIOPLASTY WITH STENT PLACEMENT  [facility]   [facility]   ESOPHAGOGASTRODUODENOSCOPY (EGD) WITH PROPOFOL N/A 02/23/2017   Procedure: ESOPHAGOGASTRODUODENOSCOPY (EGD) WITH PROPOFOL;  Surgeon: Lollie Sails, MD;  Location: [facility] ENDOSCOPY;  Service: Endoscopy;  Laterality: N/A;   PULSE GENERATOR IMPLANT N/A 03/04/2020   Procedure: PULSE GENERATOR PLACEMENT;  Surgeon: Deetta Perla, MD;  Location: [facility] ORS;  Service: Neurosurgery;  Laterality: N/A;  1ST CASE   SHOULDER ARTHROSCOPY Right 2012   THORACIC LAMINECTOMY FOR SPINAL CORD  STIMULATOR N/A 02/26/2020   Procedure: THORACIC LAMINECTOMY FOR SPINAL CORD STIMULATOR PADDLE TRIAL;  Surgeon: Deetta Perla, MD;  Location: [facility] ORS;  Service: Neurosurgery;  Laterality: N/A;  1ST CASE   TUMOR EXCISION  2007   Family History  Problem Relation Age of Onset   Diabetes Mother    Heart disease Father    Diabetes Father    Diabetes Sister    Heart disease Sister    Diabetes Brother    Heart disease Brother    Heart disease Paternal Grandfather    Cancer Neg Hx    Social History   Socioeconomic History   Marital status: Married    Spouse name: Not on file   Number of children: Not on file   Years of education: Not on file   Highest education level: Not on file  Occupational History   Not on file  Tobacco Use   Smoking status: Every Day    Packs/day: 1.00    Years: 50.00    Pack years: 50.00    Types: Cigarettes    Last attempt to quit: 02/15/2018    Years since quitting: 2.9   Smokeless tobacco: Never  Vaping Use   Vaping Use: Never used  Substance and Sexual Activity   Alcohol use: No   Drug use: No   Sexual activity: Never  Other Topics Concern   Not on file  Social History Narrative   Lives in [place] with wife. Has cat.   Work -  Database administrator, retired      Followed at [employer].      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      Diet - Regular   Exercise - starting back at gym   Social Determinants of Health   Financial Resource Strain: Low Risk    Difficulty of Paying Living Expenses: Not hard at all  Food Insecurity: No Food Insecurity   Worried About Charity fundraiser in the Last Year: Never true   Ran Out of Food in the Last Year: Never true  Transportation Needs: No Transportation Needs   Lack of Transportation (Medical): No   Lack of Transportation (Non-Medical): No  Physical Activity: Not on file  Stress: No Stress Concern Present   Feeling of Stress : Not at all  Social Connections: Unknown   Frequency of Communication with Friends and Family: Not on file   Frequency of Social Gatherings with Friends and Family: Not on file   Attends Religious Services: Not on Electrical engineer or Organizations: Not on file   Attends Archivist Meetings: Not on file   Marital Status: Married    Tobacco Counseling Ready to quit: Not Answered Counseling given: Not Answered   Clinical Intake:  Pre-visit preparation completed: Yes        Diabetes: Yes (Followed by PCP)  How often do you need to have someone help you when you read instructions, pamphlets, or other written materials from your doctor or pharmacy?: 1 - Never  Nutrition Risk Assessment: Has the patient had any N/V/D within the last 2 months?  No  Does the patient have any non-healing wounds?  No  Has the patient had any unintentional weight loss or weight gain?  No   Financial Strains and Diabetes Management: Are you having any financial strains with the device, your supplies or your medication? No .  Does the patient want to be seen by Chronic Care Management  for management of their diabetes?  No  Would the patient like to be referred to a Nutritionist or for Diabetic Management?  No   Diabetic  Exams: Diabetic Foot Exam: Overdue, Pt has been advised about the importance in completing this exam. Pt is scheduled for diabetic foot exam on 02/07/21. PCP office appointment.  Interpreter Needed?: No      Activities of Daily Living In your present state of health, do you have any difficulty performing the following activities: 01/22/2021 02/19/2020  Hearing? Y -  Comment Hearing aids, although not worn often -  Vision? N -  Difficulty concentrating or making decisions? N -  Walking or climbing stairs? Y N  Comment Chronic back pain. Leg weakness. Cane in use at times. -  Dressing or bathing? N -  Doing errands, shopping? N N  Preparing Food and eating ? N -  Using the Toilet? N -  In the past six months, have you accidently leaked urine? N -  Do you have problems with loss of bowel control? N -  Managing your Medications? N -  Managing your Finances? N -  Housekeeping or managing your Housekeeping? Y -  Comment Maid assist -  Some recent data might be hidden    Patient Care Team: Leone Haven, MD as PCP - General (Family Medicine) End, Harrell Gave, MD as PCP - Cardiology (Cardiology) Jackolyn Confer, MD (Internal Medicine) Bary Castilla Forest Gleason, MD (General Surgery)  Indicate any recent Medical Services you may have received from other than [facility] providers in the past year (date may be approximate).     Assessment:   This is a routine wellness examination for Walter Salas.  Hearing/Vision screen Hearing Screening - Comments:: Hearing aids He does not wear them often Vision Screening - Comments:: Followed by [facility]  Wears corrective lenses when reading  Diabetic eye exam; no retinopathy  They have regular follow up with the ophthalmologist  Dietary issues and exercise activities discussed: Current Exercise Habits: Home exercise routine, Intensity: Mild Regular diet   Goals Addressed               This Visit's Progress     Patient Stated      Follow up with Primary Care Provider (pt-stated)        Lower A1c Take all medications as directed Healthy diet Walk more for exercise         Depression Screen PHQ 2/9 Scores 01/22/2021 07/22/2020 01/22/2020 10/10/2019 03/07/2019 11/02/2018 07/04/2018  PHQ - 2 Score 0 2 0 0 0 0 0  PHQ- 9 Score - - - - - - -    Fall Risk Fall Risk  01/22/2021 07/22/2020 01/22/2020 01/15/2020 03/07/2019  Falls in the past year? 1 1 1 1  0  Number falls in past yr: 0 1 1 1  0  Injury with Fall? 0 0 0 0 -  Risk Factor Category  - - - - -  Risk for fall due to : - - Impaired balance/gait History of fall(s) -  Follow up Falls evaluation completed Falls evaluation completed Falls evaluation completed Falls evaluation completed Falls evaluation completed  Comment - - - - -    FALL RISK PREVENTION PERTAINING TO THE HOME: Home free of loose throw rugs in walkways, pet beds, electrical cords, etc? Yes  Adequate lighting in your home to reduce risk of falls? Yes   ASSISTIVE DEVICES UTILIZED TO PREVENT FALLS: Life alert? No  Use of a cane, walker or  w/c? Yes , sometimes cane in use  TIMED UP AND GO: Was the test performed? Yes .  Length of time to ambulate 10 feet: 10 sec.   Gait steady and fast without use of assistive device  Cognitive Function: MMSE - Mini Mental State Exam 04/21/2017 04/20/2016  Orientation to time 5 5  Orientation to Place 5 5  Registration 3 3  Attention/ Calculation 5 5  Recall 2 3  Language- name 2 objects 2 2  Language- repeat 1 1  Language- follow 3 step command 3 3  Language- read & follow direction 1 1  Write a sentence 1 1  Copy design 1 1  Total score 29 30     6CIT Screen 01/22/2021 01/22/2020 07/04/2018  What Year? 0 points 0 points 0 points  What month? 0 points 0 points 0 points  What time? 0 points 0 points 0 points  Count back from 20 - - 0 points  Months in reverse 0 points 0 points 0 points  Repeat phrase - - 2 points  Total Score - - 2     Immunizations Immunization History  Administered Date(s) Administered   Fluad Quad(high Dose 65+) 10/10/2019   Influenza,inj,Quad PF,6+ Mos 11/14/2013, 11/15/2015, 10/18/2017, 09/12/2018   Influenza-Unspecified 10/28/2012, 10/19/2014   Pneumococcal Conjugate-13 10/10/2019   Pneumococcal Polysaccharide-23 04/28/2013   Tdap 04/28/2013   Zoster Recombinat (Shingrix) 08/28/2018, 11/14/2018, 12/23/2018   Flu Vaccine status: Declined, Education has been provided regarding the importance of this vaccine but patient still declined. Advised may receive this vaccine at local pharmacy or Health Dept. Aware to provide a copy of the vaccination record if obtained from local pharmacy or Health Dept. Verbalized acceptance and understanding.  Pneumococcal vaccine status: Declined,  Education has been provided regarding the importance of this vaccine but patient still declined. Advised may receive this vaccine at local pharmacy or Health Dept. Aware to provide a copy of the vaccination record if obtained from local pharmacy or Health Dept. Verbalized acceptance and understanding.   Covid-19 vaccine status: Declined, Education has been provided regarding the importance of this vaccine but patient still declined. Advised may receive this vaccine at local pharmacy or Health Dept.or vaccine clinic. Aware to provide a copy of the vaccination record if obtained from local pharmacy or Health Dept. Verbalized acceptance and understanding.  Screening Tests Health Maintenance  Topic Date Due   HEMOGLOBIN A1C  01/22/2021   INFLUENZA VACCINE  02/07/2021 (Originally 08/12/2020)   FOOT EXAM  02/07/2021 (Originally 10/09/2020)   COVID-19 Vaccine (1) 02/07/2021 (Originally 05/09/1956)   Pneumonia Vaccine 48+ Years old (3 - PPSV23 if available, else PCV20) 01/22/2022 (Originally 10/09/2020)   OPHTHALMOLOGY EXAM  01/26/2021   COLONOSCOPY (Pts 45-32yrs Insurance coverage will need to be confirmed)  02/23/2022   TETANUS/TDAP   04/29/2023   Hepatitis C Screening  Completed   Zoster Vaccines- Shingrix  Completed   HPV VACCINES  Aged Out   Health Maintenance Health Maintenance Due  Topic Date Due   HEMOGLOBIN A1C  01/22/2021   Vision Screening: Recommended annual ophthalmology exams for early detection of glaucoma and other disorders of the eye.  Dental Screening: Recommended annual dental exams for proper oral hygiene  Community Resource Referral / Chronic Care Management: CRR required this visit?  No   CCM required this visit?  No      Plan:   Keep all routine maintenance appointments.   I have personally reviewed and noted the following in the patients chart:  Medical and social history Use of alcohol, tobacco or illicit drugs  Current medications and supplements including opioid prescriptions. Patient is currently taking opioid prescriptions. Information provided to patient regarding non-opioid alternatives. Patient advised to discuss non-opioid treatment plan with their provider. Followed by pcp.  Functional ability and status Nutritional status Physical activity Advanced directives List of other physicians Hospitalizations, surgeries, and ER visits in previous 12 months Vitals Screenings to include cognitive, depression, and falls Referrals and appointments  In addition, I have reviewed and discussed with patient certain preventive protocols, quality metrics, and best practice recommendations. A written personalized care plan for preventive services as well as general preventive health recommendations were provided to patient.     Varney Biles, LPN   [id number]

## 2021-01-22 NOTE — Patient Instructions (Addendum)
[name] , Thank you for taking time to come for your Medicare Wellness Visit. I appreciate your ongoing commitment to your health goals. Please review the following plan we discussed and let me know if I can assist you in the future.   These are the goals we discussed:  Goals       Patient Stated     Follow up with Primary Care Provider (pt-stated)      Lower A1c Take all medications as directed Healthy diet Walk more for exercise          This is a list of the screening recommended for you and due dates:  Health Maintenance  Topic Date Due   Hemoglobin A1C  01/22/2021   Flu Shot  02/07/2021*   Complete foot exam   02/07/2021*   COVID-19 Vaccine (1) 02/07/2021*   Pneumonia Vaccine (3 - PPSV23 if available, else PCV20) 01/22/2022*   Eye exam for diabetics  01/26/2021   Colon Cancer Screening  02/23/2022   Tetanus Vaccine  04/29/2023   Hepatitis C Screening: USPSTF Recommendation to screen - Ages 18-79 yo.  Completed   Zoster (Shingles) Vaccine  Completed   HPV Vaccine  Aged Out  *Topic was postponed. The date shown is not the original due date.   Advanced directives: End of life planning; Advance aging; Advanced directives discussed.  Copy of current HCPOA/Living Will requested.    Conditions/risks identified: none new  Follow up in one year for your annual wellness visit.   Preventive Care 68 Years and Older, Male Preventive care refers to lifestyle choices and visits with your health care provider that can promote health and wellness. What does preventive care include? A yearly physical exam. This is also called an annual well check. Dental exams once or twice a year. Routine eye exams. Ask your health care provider how often you should have your eyes checked. Personal lifestyle choices, including: Daily care of your teeth and gums. Regular physical activity. Eating a healthy diet. Avoiding tobacco and drug use. Limiting alcohol use. Practicing safe  sex. Taking low doses of aspirin every day. Taking vitamin and mineral supplements as recommended by your health care provider. What happens during an annual well check? The services and screenings done by your health care provider during your annual well check will depend on your age, overall health, lifestyle risk factors, and family history of disease. Counseling  Your health care provider may ask you questions about your: Alcohol use. Tobacco use. Drug use. Emotional well-being. Home and relationship well-being. Sexual activity. Eating habits. History of falls. Memory and ability to understand (cognition). Work and work Statistician. Screening  You may have the following tests or measurements: Height, weight, and BMI. Blood pressure. Lipid and cholesterol levels. These may be checked every 5 years, or more frequently if you are over 49 years old. Skin check. Lung cancer screening. You may have this screening every year starting at age 68 if you have a 30-pack-year history of smoking and currently smoke or have quit within the past 15 years. Fecal occult blood test (FOBT) of the stool. You may have this test every year starting at age 68. Flexible sigmoidoscopy or colonoscopy. You may have a sigmoidoscopy every 5 years or a colonoscopy every 10 years starting at age 68. Prostate cancer screening. Recommendations will vary depending on your family history and other risks. Hepatitis C blood test. Hepatitis B blood test. Sexually transmitted disease (STD) testing. Diabetes screening. This is done by checking  your blood sugar (glucose) after you have not eaten for a while (fasting). You may have this done every 1-3 years. Abdominal aortic aneurysm (AAA) screening. You may need this if you are a current or former smoker. Osteoporosis. You may be screened starting at age 68 if you are at high risk. Talk with your health care provider about your test results, treatment options, and if  necessary, the need for more tests. Vaccines  Your health care provider may recommend certain vaccines, such as: Influenza vaccine. This is recommended every year. Tetanus, diphtheria, and acellular pertussis (Tdap, Td) vaccine. You may need a Td booster every 10 years. Zoster vaccine. You may need this after age 68. Pneumococcal 13-valent conjugate (PCV13) vaccine. One dose is recommended after age 68. Pneumococcal polysaccharide (PPSV23) vaccine. One dose is recommended after age 68. Talk to your health care provider about which screenings and vaccines you need and how often you need them. This information is not intended to replace advice given to you by your health care provider. Make sure you discuss any questions you have with your health care provider. Document Released: 01/25/2015 Document Revised: 09/18/2015 Document Reviewed: 10/30/2014 Elsevier Interactive Patient Education  2017 [place] Prevention in the Home Falls can cause injuries. They can happen to people of all ages. There are many things you can do to make your home safe and to help prevent falls. What can I do on the outside of my home? Regularly fix the edges of walkways and driveways and fix any cracks. Remove anything that might make you trip as you walk through a door, such as a raised step or threshold. Trim any bushes or trees on the path to your home. Use bright outdoor lighting. Clear any walking paths of anything that might make someone trip, such as rocks or tools. Regularly check to see if handrails are loose or broken. Make sure that both sides of any steps have handrails. Any raised decks and porches should have guardrails on the edges. Have any leaves, snow, or ice cleared regularly. Use sand or salt on walking paths during winter. Clean up any spills in your garage right away. This includes oil or grease spills. What can I do in the bathroom? Use night lights. Install grab bars by the toilet  and in the tub and shower. Do not use towel bars as grab bars. Use non-skid mats or decals in the tub or shower. If you need to sit down in the shower, use a plastic, non-slip stool. Keep the floor dry. Clean up any water that spills on the floor as soon as it happens. Remove soap buildup in the tub or shower regularly. Attach bath mats securely with double-sided non-slip rug tape. Do not have throw rugs and other things on the floor that can make you trip. What can I do in the bedroom? Use night lights. Make sure that you have a light by your bed that is easy to reach. Do not use any sheets or blankets that are too big for your bed. They should not hang down onto the floor. Have a firm chair that has side arms. You can use this for support while you get dressed. Do not have throw rugs and other things on the floor that can make you trip. What can I do in the kitchen? Clean up any spills right away. Avoid walking on wet floors. Keep items that you use a lot in easy-to-reach places. If you need to reach something  above you, use a strong step stool that has a grab bar. Keep electrical cords out of the way. Do not use floor polish or wax that makes floors slippery. If you must use wax, use non-skid floor wax. Do not have throw rugs and other things on the floor that can make you trip. What can I do with my stairs? Do not leave any items on the stairs. Make sure that there are handrails on both sides of the stairs and use them. Fix handrails that are broken or loose. Make sure that handrails are as long as the stairways. Check any carpeting to make sure that it is firmly attached to the stairs. Fix any carpet that is loose or worn. Avoid having throw rugs at the top or bottom of the stairs. If you do have throw rugs, attach them to the floor with carpet tape. Make sure that you have a light switch at the top of the stairs and the bottom of the stairs. If you do not have them, ask someone to add  them for you. What else can I do to help prevent falls? Wear shoes that: Do not have high heels. Have rubber bottoms. Are comfortable and fit you well. Are closed at the toe. Do not wear sandals. If you use a stepladder: Make sure that it is fully opened. Do not climb a closed stepladder. Make sure that both sides of the stepladder are locked into place. Ask someone to hold it for you, if possible. Clearly mark and make sure that you can see: Any grab bars or handrails. First and last steps. Where the edge of each step is. Use tools that help you move around (mobility aids) if they are needed. These include: Canes. Walkers. Scooters. Crutches. Turn on the lights when you go into a dark area. Replace any light bulbs as soon as they burn out. Set up your furniture so you have a clear path. Avoid moving your furniture around. If any of your floors are uneven, fix them. If there are any pets around you, be aware of where they are. Review your medicines with your doctor. Some medicines can make you feel dizzy. This can increase your chance of falling. Ask your doctor what other things that you can do to help prevent falls. This information is not intended to replace advice given to you by your health care provider. Make sure you discuss any questions you have with your health care provider. Document Released: 10/25/2008 Document Revised: 06/06/2015 Document Reviewed: 02/02/2014 Elsevier Interactive Patient Education  2017 [place].  Opioid Pain Medicine Management Opioids are powerful medicines that are used to treat moderate to severe pain. When used for short periods of time, they can help you to: Sleep better. Do better in physical or occupational therapy. Feel better in the first few days after an injury. Recover from surgery. Opioids should be taken with the supervision of a trained health care provider. They should be taken for the shortest period of time possible. This is  because opioids can be addictive, and the longer you take opioids, the greater your risk of addiction. This addiction can also be called opioid use disorder. What are the risks? Using opioid pain medicines for longer than 3 days increases your risk of side effects. Side effects include: Constipation. Nausea and vomiting. Breathing difficulties (respiratory depression). Drowsiness. Confusion. Opioid use disorder. Itching. Taking opioid pain medicine for a long period of time can affect your ability to do daily tasks. It  also puts you at risk for: Motor vehicle crashes. Depression. Suicide. Heart attack. Overdose, which can be life-threatening. What is a pain treatment plan? A pain treatment plan is an agreement between you and your health care provider. Pain is unique to each person, and treatments vary depending on your condition. To manage your pain, you and your health care provider need to work together. To help you do this: Discuss the goals of your treatment, including how much pain you might expect to have and how you will manage the pain. Review the risks and benefits of taking opioid medicines. Remember that a good treatment plan uses more than one approach and minimizes the chance of side effects. Be honest about the amount of medicines you take and about any drug or alcohol use. Get pain medicine prescriptions from only one health care provider. Pain can be managed with many types of alternative treatments. Ask your health care provider to refer you to one or more specialists who can help you manage pain through: Physical or occupational therapy. Counseling (cognitive behavioral therapy). Good nutrition. Biofeedback. Massage. Meditation. Non-opioid medicine. Following a gentle exercise program. How to use opioid pain medicine Taking medicine Take your pain medicine exactly as told by your health care provider. Take it only when you need it. If your pain gets less severe,  you may take less than your prescribed dose if your health care provider approves. If you are not having pain, do nottake pain medicine unless your health care provider tells you to take it. If your pain is severe, do nottry to treat it yourself by taking more pills than instructed on your prescription. Contact your health care provider for help. Write down the times when you take your pain medicine. It is easy to become confused while on pain medicine. Writing the time can help you avoid overdose. Take other over-the-counter or prescription medicines only as told by your health care provider. Keeping yourself and others safe  While you are taking opioid pain medicine: Do not drive, use machinery, or power tools. Do not sign legal documents. Do not drink alcohol. Do not take sleeping pills. Do not supervise children by yourself. Do not do activities that require climbing or being in high places. Do not go to a lake, river, ocean, spa, or swimming pool. Do not share your pain medicine with anyone. Keep pain medicine in a locked cabinet or in a secure area where pets and children cannot reach it. Stopping your use of opioids If you have been taking opioid medicine for more than a few weeks, you may need to slowly decrease (taper) how much you take until you stop completely. Tapering your use of opioids can decrease your risk of symptoms of withdrawal, such as: Pain and cramping in the abdomen. Nausea. Sweating. Sleepiness. Restlessness. Uncontrollable shaking (tremors). Cravings for the medicine. Do not attempt to taper your use of opioids on your own. Talk with your health care provider about how to do this. Your health care provider may prescribe a step-down schedule based on how much medicine you are taking and how long you have been taking it. Getting rid of leftover pills Do not save any leftover pills. Get rid of leftover pills safely by: Taking the medicine to a prescription  take-back program. This is usually offered by the county or law enforcement. Bringing them to a pharmacy that has a drug disposal container. Flushing them down the toilet. Check the label or package insert of your medicine to  see whether this is safe to do. Throwing them out in the trash. Check the label or package insert of your medicine to see whether this is safe to do. If it is safe to throw it out, remove the medicine from the original container, put it into a sealable bag or container, and mix it with used coffee grounds, food scraps, dirt, or cat litter before putting it in the trash. Follow these instructions at home: Activity Do exercises as told by your health care provider. Avoid activities that make your pain worse. Return to your normal activities as told by your health care provider. Ask your health care provider what activities are safe for you. General instructions You may need to take these actions to prevent or treat constipation: Drink enough fluid to keep your urine pale yellow. Take over-the-counter or prescription medicines. Eat foods that are high in fiber, such as beans, whole grains, and fresh fruits and vegetables. Limit foods that are high in fat and processed sugars, such as fried or sweet foods. Keep all follow-up visits. This is important. Where to find support If you have been taking opioids for a long time, you may benefit from receiving support for quitting from a local support group or counselor. Ask your health care provider for a referral to these resources in your area. Where to find more information Centers for Disease Control and Prevention (CDC): http://www.wolf.info/ [place] Food and Drug Administration (FDA): [place] Get help right away if: You may have taken too much of an opioid (overdosed). Common symptoms of an overdose: Your breathing is slower or more shallow than normal. You have a very slow heartbeat (pulse). You have slurred speech. You have nausea  and vomiting. Your pupils become very small. You have other potential symptoms: You are very confused. You faint or feel like you will faint. You have cold, clammy skin. You have blue lips or fingernails. You have thoughts of harming yourself or harming others. These symptoms may represent a serious problem that is an emergency. Do not wait to see if the symptoms will go away. Get medical help right away. Call your local emergency services (911 in the [place] Do not drive yourself to the hospital.  If you ever feel like you may hurt yourself or others, or have thoughts about taking your own life, get help right away. Go to your nearest emergency department or: Call your local emergency services (911 in the [place] Call the [facility] 416-659-5086 in the [place] Call a suicide crisis helpline, such as the [facility] at 402-729-3361 or 988 in the [place]. This is open 24 hours a day in the [place] Text the Crisis Text Line at (938)318-2916 (in the [place].). Summary Opioid medicines can help you manage moderate to severe pain for a short period of time. A pain treatment plan is an agreement between you and your health care provider. Discuss the goals of your treatment, including how much pain you might expect to have and how you will manage the pain. If you think that you or someone else may have taken too much of an opioid, get medical help right away. This information is not intended to replace advice given to you by your health care provider. Make sure you discuss any questions you have with your health care provider. Document Revised: 07/24/2020 Document Reviewed: 04/10/2020 Elsevier Patient Education  [place].

## 2021-02-07 ENCOUNTER — Other Ambulatory Visit: Payer: Self-pay

## 2021-02-07 ENCOUNTER — Ambulatory Visit (INDEPENDENT_AMBULATORY_CARE_PROVIDER_SITE_OTHER): Payer: Medicare Other | Admitting: Family Medicine

## 2021-02-07 ENCOUNTER — Encounter: Payer: Self-pay | Admitting: Family Medicine

## 2021-02-07 VITALS — BP 125/70 | HR 82 | Temp 98.2°F | Ht 70.0 in | Wt 227.4 lb

## 2021-02-07 DIAGNOSIS — M5442 Lumbago with sciatica, left side: Secondary | ICD-10-CM

## 2021-02-07 DIAGNOSIS — B351 Tinea unguium: Secondary | ICD-10-CM

## 2021-02-07 DIAGNOSIS — I7121 Aneurysm of the ascending aorta, without rupture: Secondary | ICD-10-CM | POA: Diagnosis not present

## 2021-02-07 DIAGNOSIS — F17210 Nicotine dependence, cigarettes, uncomplicated: Secondary | ICD-10-CM

## 2021-02-07 DIAGNOSIS — M545 Low back pain, unspecified: Secondary | ICD-10-CM

## 2021-02-07 DIAGNOSIS — E119 Type 2 diabetes mellitus without complications: Secondary | ICD-10-CM

## 2021-02-07 DIAGNOSIS — G8929 Other chronic pain: Secondary | ICD-10-CM | POA: Diagnosis not present

## 2021-02-07 DIAGNOSIS — Z23 Encounter for immunization: Secondary | ICD-10-CM

## 2021-02-07 DIAGNOSIS — G609 Hereditary and idiopathic neuropathy, unspecified: Secondary | ICD-10-CM

## 2021-02-07 DIAGNOSIS — E041 Nontoxic single thyroid nodule: Secondary | ICD-10-CM | POA: Diagnosis not present

## 2021-02-07 LAB — BASIC METABOLIC PANEL
BUN: 17 mg/dL (ref 6–23)
CO2: 31 mEq/L (ref 19–32)
Calcium: 9.5 mg/dL (ref 8.4–10.5)
Chloride: 97 mEq/L (ref 96–112)
Creatinine, Ser: 0.87 mg/dL (ref 0.40–1.50)
GFR: 89.45 mL/min (ref >60.00)
Glucose, Bld: 185 mg/dL — ABNORMAL HIGH (ref 70–99)
Potassium: 4.1 mEq/L (ref 3.5–5.1)
Sodium: 135 mEq/L (ref 135–145)

## 2021-02-07 MED ORDER — TRAMADOL HCL 50 MG PO TABS: ORAL_TABLET | ORAL | 0 refills | Status: DC

## 2021-02-07 NOTE — Patient Instructions (Signed)
Nice to see you. We will check lab work today and contact you with the results. I have refilled your tramadol.  Please do not drive while taking this given that it makes you mildly drowsy. Given that you have the aneurysm in your aorta you need to seek medical attention if you ever develop chest pain.

## 2021-02-07 NOTE — Assessment & Plan Note (Addendum)
Check A1c and BMP.  He will continue Ozempic 1 mg weekly and metformin 1000 mg twice daily.  Foot exam completed today.

## 2021-02-07 NOTE — Assessment & Plan Note (Signed)
Generally stable.  He can continue Lyrica 50 mg 3 times daily.

## 2021-02-07 NOTE — Assessment & Plan Note (Signed)
Stable on most recent imaging.  No chest pain.  I counseled that if he ever develops chest pain he needs to be evaluated immediately.

## 2021-02-07 NOTE — Assessment & Plan Note (Signed)
Has been stable on several CT scans over the last several years.  Continue to monitor.

## 2021-02-07 NOTE — Progress Notes (Signed)
Tommi Rumps, MD Phone: [phone]  Walter Salas is a 66 y.o. male who presents today for follow-up.  Chronic back pain: This is a chronic ongoing issue.  He has seen several specialist in the past.  He notes his symptoms are stable.  He has chronic left leg numbness down his buttocks and posterior leg.  He notes chronic left foot numbness.  He notes chronic bilateral leg weakness.  He notes these things are stable.  He has pain that radiates down both of his legs at times.  No incontinence.  He does have a spinal cord stimulator which has been helpful.  He takes tramadol 2 pills twice daily with some mild drowsiness.  He does not drive when he takes this.  No alcohol intake.  He also takes Cymbalta and Lyrica.  Thoracic aortic aneurysm: This is stable on recent CT chest in December 2022.  He is followed by cardiology.  No chest pain.  DIABETES Disease Monitoring: Blood Sugar ranges-not checking polyuria/phagia/dipsia- no      Medications: Compliance- taking metformin, ozempic Hypoglycemic symptoms- no Denies nausea, abdominal pain, and GI issues.  Nicotine dependence, cigarettes: Patient continues to smoke.  He is not ready to quit smoking.  Social History   Tobacco Use  Smoking Status Every Day   Packs/day: 1.00   Years: 50.00   Pack years: 50.00   Types: Cigarettes   Last attempt to quit: 02/15/2018   Years since quitting: 2.9  Smokeless Tobacco Never    Current Outpatient Medications on File Prior to Visit  Medication Sig Dispense Refill   amLODipine (NORVASC) 2.5 MG tablet TAKE ONE TABLET BY MOUTH EVERY DAY 90 tablet 0   ASPIRIN 81 PO TAKE 81 MG BY MOUTH ONCE EVERY DAY     atorvastatin (LIPITOR) 80 MG tablet Take 80 mg by mouth at bedtime.     chlorthalidone (HYGROTON) 50 MG tablet Take 25 mg by mouth daily.     cholecalciferol (VITAMIN D) 1000 UNITS tablet Take 1 tablet (1,000 Units total) by mouth QID. 90 tablet 0   CHOLECALCIFEROL PO Take 4,000 Int'l Units/1.41m2  by mouth daily.     DULoxetine (CYMBALTA) 20 MG capsule Take 20 mg by mouth daily.     fenofibrate (TRICOR) 145 MG tablet Take 1 tablet (145 mg total) by mouth daily. 30 tablet 0   glucose blood test strip Check once daily, E11.9 100 each 12   lidocaine (LIDODERM) 5 % 1 patch daily as needed-Remove & Discard patch within 12 hours or as directed by MD     losartan (COZAAR) 25 MG tablet Take 25 mg by mouth daily.     metFORMIN (GLUCOPHAGE) 1000 MG tablet Take 1,000 mg by mouth 2 (two) times daily with a meal.     methyl salicylate liquid Apply 1 application topically as needed for muscle pain.     nitroGLYCERIN (NITROSTAT) 0.4 MG SL tablet Place 1 tablet (0.4 mg total) under the tongue every 5 (five) minutes as needed for chest pain. May take up to 3 doses. 30 tablet 0   omeprazole (PRILOSEC) 20 MG capsule Take 20 mg by mouth daily.     polyvinyl alcohol (LIQUIFILM TEARS) 1.4 % ophthalmic solution Place 1 drop into both eyes as needed for dry eyes.      potassium chloride SA (K-DUR,KLOR-CON) 20 MEQ tablet Take 1 tablet (20 mEq total) by mouth daily. 90 tablet 3   pregabalin (LYRICA) 50 MG capsule Take 1 capsule (50 mg total) by  mouth 3 (three) times daily. 30 capsule 0   Semaglutide, 1 MG/DOSE, (OZEMPIC, 1 MG/DOSE,) 4 MG/3ML SOPN INJECT 0.75 ML (1 MG) INTO THE SKIN ONCEA WEEK 15 mL 1   tamsulosin (FLOMAX) 0.4 MG CAPS capsule TAKE 1 CAPSULE BY MOUTH ONCE DAILY 30 capsule 3   VASCEPA 1 g capsule TAKE 2 CAPSULES BY MOUTH TWICE DAILY 120 capsule 3   vitamin B-12 (CYANOCOBALAMIN) 1000 MCG tablet Take 1 tablet (1,000 mcg total) by mouth daily. 30 tablet 0   No current facility-administered medications on file prior to visit.     ROS see history of present illness  Objective  Physical Exam Vitals:   02/07/21 0840  BP: 125/70  Pulse: 82  Temp: 98.2 F (36.8 C)  SpO2: 97%    BP Readings from Last 3 Encounters:  02/07/21 125/70  01/22/21 127/73  07/22/20 124/60   Wt Readings from Last 3  Encounters:  02/07/21 227 lb 6.4 oz (103.1 kg)  01/22/21 221 lb 9.6 oz (100.5 kg)  07/22/20 218 lb 9.6 oz (99.2 kg)    Physical Exam Constitutional:      General: He is not in acute distress.    Appearance: He is not diaphoretic.  Cardiovascular:     Rate and Rhythm: Normal rate and regular rhythm.     Heart sounds: Normal heart sounds.  Pulmonary:     Effort: Pulmonary effort is normal.     Breath sounds: Normal breath sounds.  Musculoskeletal:     Right lower leg: No edema.     Left lower leg: No edema.  Skin:    General: Skin is warm and dry.  Neurological:     Mental Status: He is alert.   Diabetic Foot Exam - Simple   Simple Foot Form Diabetic Foot exam was performed with the following findings: Yes 02/07/2021  8:48 AM  Visual Inspection See comments: Yes Sensation Testing See comments: Yes Pulse Check Posterior Tibialis and Dorsalis pulse intact bilaterally: Yes Comments Onychomycosis all toes bilateral feet, otherwise no deformities, ulcerations, or skin breakdown, sensation to light touch and monofilament testing decreased throughout his left foot (patient reports this is chronic and stable), intact right foot     Assessment/Plan: Please see individual problem list.  Problem List Items Addressed This Visit     Chronic back pain (Chronic)    Chronic issue.  Stable.  Discussed at this point the objective is to control his discomfort so that he can maintain some level of activity.  He can continue tramadol 100 mg twice daily, Lyrica 50 mg 3 times daily, Cymbalta 20 mg daily.  He was advised not to drive while taking his tramadol.  Controlled substance database reviewed.  Refill provided.      Relevant Medications   traMADol (ULTRAM) 50 MG tablet   Diabetes mellitus, type 2 (HCC) (Chronic)    Check A1c and BMP.  He will continue Ozempic 1 mg weekly and metformin 1000 mg twice daily.  Foot exam completed today.        Relevant Orders   HgB O7F   Basic Metabolic  Panel (BMET)   Hereditary and idiopathic peripheral neuropathy    Generally stable.  He can continue Lyrica 50 mg 3 times daily.      Nicotine dependence, cigarettes, uncomplicated    Smoking cessation counseling was provided.  Approximately 3 minutes were spent discussing the rationale for tobacco cessation and strategies for doing so.  Adjuncts, including nicotine patches, nicotine lozenges, varenicline and  buproprion were recommended.  Patient declines any treatment for this.  He will let us know in the future if he is ready to quit smoking.  Stable lung findings on recent CT angiogram aorta.  Follow-up in 6 months.       Onychomycosis    Reports he tried multiple treatments for this in the past.  No benefit from any treatment.  He will monitor.      Thoracic aortic aneurysm without rupture - Primary    Stable on most recent imaging.  No chest pain.  I counseled that if he ever develops chest pain he needs to be evaluated immediately.      Thyroid nodule    Has been stable on several CT scans over the last several years.  Continue to monitor.      Other Visit Diagnoses     Need for immunization against influenza       Relevant Orders   Flu Vaccine QUAD High Dose(Fluad) (Completed)   Acute left-sided low back pain with left-sided sciatica       Relevant Medications   traMADol (ULTRAM) 50 MG tablet       Return in about 6 months (around 08/07/2021) for Diabetes/hypertension.  This visit occurred during the SARS-CoV-2 public health emergency.  Safety protocols were in place, including screening questions prior to the visit, additional usage of staff PPE, and extensive cleaning of exam room while observing appropriate contact time as indicated for disinfecting solutions.    Tommi Rumps, MD [place]

## 2021-02-07 NOTE — Assessment & Plan Note (Signed)
Reports he tried multiple treatments for this in the past.  No benefit from any treatment.  He will monitor.

## 2021-02-07 NOTE — Assessment & Plan Note (Signed)
Smoking cessation counseling was provided.  Approximately 3 minutes were spent discussing the rationale for tobacco cessation and strategies for doing so.  Adjuncts, including nicotine patches, nicotine lozenges, varenicline and buproprion were recommended.  Patient declines any treatment for this.  He will let us know in the future if he is ready to quit smoking.  Stable lung findings on recent CT angiogram aorta.  Follow-up in 6 months.

## 2021-02-07 NOTE — Assessment & Plan Note (Signed)
Chronic issue.  Stable.  Discussed at this point the objective is to control his discomfort so that he can maintain some level of activity.  He can continue tramadol 100 mg twice daily, Lyrica 50 mg 3 times daily, Cymbalta 20 mg daily.  He was advised not to drive while taking his tramadol.  Controlled substance database reviewed.  Refill provided.

## 2021-02-10 LAB — HEMOGLOBIN A1C: Hgb A1c MFr Bld: 7 % — ABNORMAL HIGH (ref 4.6–6.5)

## 2021-02-17 ENCOUNTER — Ambulatory Visit
Admission: RE | Admit: 2021-02-17 | Discharge: 2021-02-17 | Disposition: A | Discharge status: Home / Self Care | Payer: Medicare Other | Source: Ambulatory Visit | Attending: Adult Health | Admitting: Adult Health

## 2021-02-17 ENCOUNTER — Ambulatory Visit (INDEPENDENT_AMBULATORY_CARE_PROVIDER_SITE_OTHER): Payer: Medicare Other

## 2021-02-17 ENCOUNTER — Encounter: Payer: Self-pay | Admitting: Adult Health

## 2021-02-17 ENCOUNTER — Other Ambulatory Visit: Payer: Self-pay

## 2021-02-17 ENCOUNTER — Ambulatory Visit (INDEPENDENT_AMBULATORY_CARE_PROVIDER_SITE_OTHER): Payer: Medicare Other | Admitting: Adult Health

## 2021-02-17 VITALS — BP 130/78 | HR 78 | Temp 98.0°F | Resp 14 | Ht 70.0 in | Wt 228.0 lb

## 2021-02-17 DIAGNOSIS — R202 Paresthesia of skin: Secondary | ICD-10-CM | POA: Insufficient documentation

## 2021-02-17 DIAGNOSIS — M25511 Pain in right shoulder: Secondary | ICD-10-CM

## 2021-02-17 DIAGNOSIS — M19011 Primary osteoarthritis, right shoulder: Secondary | ICD-10-CM

## 2021-02-17 DIAGNOSIS — Z9889 Other specified postprocedural states: Secondary | ICD-10-CM | POA: Insufficient documentation

## 2021-02-17 DIAGNOSIS — M778 Other enthesopathies, not elsewhere classified: Secondary | ICD-10-CM | POA: Diagnosis not present

## 2021-02-17 LAB — CBC WITH DIFFERENTIAL/PLATELET
Basophils Absolute: 0.1 10*3/uL (ref 0.0–0.1)
Basophils Relative: 0.8 % (ref 0.0–3.0)
Eosinophils Absolute: 0.1 10*3/uL (ref 0.0–0.7)
Eosinophils Relative: 2.4 % (ref 0.0–5.0)
HCT: 44.3 % (ref 39.0–52.0)
Hemoglobin: 14.9 g/dL (ref 13.0–17.0)
Lymphocytes Relative: 23.3 % (ref 12.0–46.0)
Lymphs Abs: 1.4 10*3/uL (ref 0.7–4.0)
MCHC: 33.7 g/dL (ref 30.0–36.0)
MCV: 81.4 fl (ref 78.0–100.0)
Monocytes Absolute: 0.6 10*3/uL (ref 0.1–1.0)
Monocytes Relative: 9 % (ref 3.0–12.0)
Neutro Abs: 4 10*3/uL (ref 1.4–7.7)
Neutrophils Relative %: 64.5 % (ref 43.0–77.0)
Platelets: 207 10*3/uL (ref 150.0–400.0)
RBC: 5.44 Mil/uL (ref 4.22–5.81)
RDW: 14.8 % (ref 11.5–15.5)
WBC: 6.1 10*3/uL (ref 4.0–10.5)

## 2021-02-17 LAB — COMPREHENSIVE METABOLIC PANEL
ALT: 16 U/L (ref 0–53)
AST: 12 U/L (ref 0–37)
Albumin: 4.6 g/dL (ref 3.5–5.2)
Alkaline Phosphatase: 60 U/L (ref 39–117)
BUN: 18 mg/dL (ref 6–23)
CO2: 28 mEq/L (ref 19–32)
Calcium: 9.3 mg/dL (ref 8.4–10.5)
Chloride: 100 mEq/L (ref 96–112)
Creatinine, Ser: 0.74 mg/dL (ref 0.40–1.50)
GFR: 93.91 mL/min (ref >60.00)
Glucose, Bld: 158 mg/dL — ABNORMAL HIGH (ref 70–99)
Potassium: 3.7 mEq/L (ref 3.5–5.1)
Sodium: 136 mEq/L (ref 135–145)
Total Bilirubin: 0.6 mg/dL (ref 0.2–1.2)
Total Protein: 7.3 g/dL (ref 6.0–8.3)

## 2021-02-17 LAB — B12 AND FOLATE PANEL
Folate: 18.3 ng/mL (ref >5.9)
Vitamin B-12: 234 pg/mL (ref 211–911)

## 2021-02-17 LAB — SEDIMENTATION RATE: Sed Rate: 7 mm/hr (ref 0–20)

## 2021-02-17 LAB — TSH: TSH: 1.86 u[IU]/mL (ref 0.35–5.50)

## 2021-02-17 LAB — TROPONIN I (HIGH SENSITIVITY): High Sens Troponin I: 13 ng/L (ref 2–17)

## 2021-02-17 NOTE — Progress Notes (Signed)
EKG with 1st degree heart block, known history PRI interval increased was reviewed by Murray Hodgkins NP in cardiology and sent to Dr. Saunders Revel patients cardiologist. No significant change since last EKG in system. No cardiac symptoms seen for right shoulder pain musculoskeletal, felt to be orthopedic and x ray confirmed. No sooner follow up with cardiology advised by cardiology at this time is advised.

## 2021-02-17 NOTE — Progress Notes (Signed)
NO evidence of blood clot in right upper extremity.  Please follow up to see how his orthopedic appointment went ?

## 2021-02-17 NOTE — Patient Instructions (Addendum)
ER if any symptoms worsen is advised.  Go to the medical center in [place] at pt can arrive at 3:45 pm at the med center [facility] for ultrasound of right arm. X ray of right shoulder now in office.   Address: [address] [place], [place] [address] Hours:  Phone: [phone]  Shoulder Pain Many things can cause shoulder pain, including: An injury to the shoulder. Overuse of the shoulder. Arthritis. The source of the pain can be: Inflammation. An injury to the shoulder joint. An injury to a tendon, ligament, or bone. Follow these instructions at home: Pay attention to changes in your symptoms. Let your health care provider know about them. Follow these instructions to relieve your pain. If you have a sling: Wear the sling as told by your health care provider. Remove it only as told by your health care provider. Loosen the sling if your fingers tingle, become numb, or turn cold and blue. Keep the sling clean. If the sling is not waterproof: Do not let it get wet. Remove it to shower or bathe. Move your arm as little as possible, but keep your hand moving to prevent swelling. Managing pain, stiffness, and swelling  If directed, put ice on the painful area: Put ice in a plastic bag. Place a towel between your skin and the bag. Leave the ice on for 20 minutes, 2-3 times per day. Stop applying ice if it does not help with the pain. Squeeze a soft ball or a foam pad as much as possible. This helps to keep the shoulder from swelling. It also helps to strengthen the arm. General instructions Take over-the-counter and prescription medicines only as told by your health care provider. Keep all follow-up visits as told by your health care provider. This is important. Contact a health care provider if: Your pain gets worse. Your pain is not relieved with medicines. New pain develops in your arm, hand, or fingers. Get help right away if: Your arm, hand, or fingers: Tingle. Become  numb. Become swollen. Become painful. Turn white or blue. Summary Shoulder pain can be caused by an injury, overuse, or arthritis. Pay attention to changes in your symptoms. Let your health care provider know about them. This condition may be treated with a sling, ice, and pain medicines. Contact your health care provider if the pain gets worse or new pain develops. Get help right away if your arm, hand, or fingers tingle or become numb, swollen, or painful. Keep all follow-up visits as told by your health care provider. This is important. This information is not intended to replace advice given to you by your health care provider. Make sure you discuss any questions you have with your health care provider. Document Revised: 07/13/2017 Document Reviewed: 07/13/2017 Elsevier Patient Education  2022 [place].   Paresthesia Paresthesia is a burning or prickling feeling. This feeling can happen in any part of the body. It often happens in the hands, arms, legs, or feet. Usually, it is not painful. In most cases, the feeling goes away in a short time and is not a sign of a serious problem. If you have paresthesia that lasts a long time, you need to see your doctor. Follow these instructions at home: Nutrition Eat a healthy diet. This includes: Eating foods that are high in fiber. These include beans, whole grains, and fresh fruits and vegetables. Limiting foods that are high in fat and sugar. These include fried or sweet foods.  Alcohol use  Do not  drink alcohol if: Your doctor tells you not to drink. You are pregnant, may be pregnant, or are planning to become pregnant. If you drink alcohol: Limit how much you have to: 0-1 drink a day for women. 0-2 drinks a day for men. Know how much alcohol is in your drink. In the [place] one drink equals one 12 oz bottle of beer (355 mL), one 5 oz glass of wine (148 mL), or one 1 oz glass of hard liquor (44 mL). General instructions Take  over-the-counter and prescription medicines only as told by your doctor. Do not smoke or use any products that contain nicotine or tobacco. If you need help quitting, ask your doctor. If you have diabetes, work with your doctor to make sure your blood sugar stays in a healthy range. If your feet feel numb: Check for redness, warmth, and swelling every day. Wear padded socks and comfortable shoes. These help protect your feet. Keep all follow-up visits. Contact a doctor if: You have paresthesia that gets worse or does not go away. You lose feeling (have numbness) after an injury. Your burning or prickling feeling gets worse when you walk. You have pain or cramps. You feel dizzy or you faint. You have a rash. Get help right away if: You feel weak or have new weakness in an arm or leg. You have trouble walking or moving. You have problems speaking, understanding, or seeing. You feel confused. You cannot control when you pee (urinate) or poop (have a bowel movement). These symptoms may be an emergency. Get help right away. Call 911. Do not wait to see if the symptoms will go away. Do not drive yourself to the hospital. Summary Paresthesia is a burning or prickling feeling. It often happens in the hands, arms, legs, or feet. In most cases, the feeling goes away in a short time and is not a sign of a serious problem. If you have paresthesia that lasts a long time, you need to be seen by your doctor. This information is not intended to replace advice given to you by your health care provider. Make sure you discuss any questions you have with your health care provider. Document Revised: 09/09/2020 Document Reviewed: 09/09/2020 Elsevier Patient Education  [place].

## 2021-02-17 NOTE — Progress Notes (Addendum)
Acute Office Visit  Subjective:    Patient ID: Walter Salas, male    DOB: Oct 26, 1955, 66 y.o.   MRN: [id number]  Chief Complaint  Patient presents with   Acute Visit    R shoulder numbness radiates all the way down to his fingers, onset Saturday denies any injury or prior hx.    Shoulder pain anterior right, points to area of tenderness, right arm has numbness on right shoulder to pinky finger only. Onset 1 week ago.  He has no other associated symptoms.  Right rotator cuff surgery on the right shoulder 20 years ago.  Denies any injury or trauma.  Denies any other pain other than his normal pain, he has spinal stimulator.  Denies any injury, head trauma. Denies any prior stroke. Denies any weakness.  Denies any neck pain.   Denies any recent illness.  Patient  denies any fever, body aches,chills, rash, chest pain, neclk pain, jaw pain, shortness of breath, nausea, vomiting, or diarrhea.  Denies dizziness, lightheadedness, pre syncopal or syncopal episodes.  .       Past Medical History:  Diagnosis Date   (HFpEF) heart failure with preserved ejection fraction ([place])    a. 2018 Echo: EF 60-65%, normal wall motion, Gr1DD, mild aortic valve calcification; b. 12/2017 Echo: EF 60-65%, no rwma, Ao root 4.3cm, Asc Ao 4.4 cm, mildly dil LA.   Agent orange exposure    Anginal pain ([place])    Arthritis    CAD (coronary artery disease)    a. s/p PCI/DES to the LCx and RCA in 2014; b. 05/2016 MV: EF 55-65%. Small, mild defect in basal anterolateral location->infarct w/o ischemia.   Cancer ([place])    skin   Chronic cough    Chronic pain    Colon polyp    Depression    Diabetes mellitus without complication (HCC)    Diverticulosis    GERD (gastroesophageal reflux disease)    History of kidney stones    History of stomach ulcers    History of subdural hematoma 12/14/2012   HOH (hard of hearing)    Hx of laminectomy 1999   Hyperlipidemia    Hypertension    Myocardial infarction  [facility])    Sleep apnea    Syncope    a. Event monitor 2018: NSR with first-degree AV block, no significant arrhythmias or pauses; b.  Myoview showed a small in size, mild in severity fixed basal and anterior lateral defect, no ischemia, LVEF 55 to 65%, low risk study   Thoracic aortic aneurysm    a. TTE 2018: Mildly dil Ao root- 4.2 cm; b. CTA aorta 6/18: Asc Ao aneurysmal dil w max diam of 4.3 cm; b. 12/2019 CTA Chest: stable 4.3cm thor Ao Aneurysm.   Ulcer     Past Surgical History:  Procedure Laterality Date   [place] Bilateral 1990   CATARACT EXTRACTION W/PHACO Left 03/08/2018   Procedure: CATARACT EXTRACTION PHACO AND INTRAOCULAR LENS PLACEMENT ([place]) LEFT;  Surgeon: Birder Robson, MD;  Location: [facility] OR[facility];  Service: Ophthalmology;  Laterality: Left;  [place]  00:24.2 CDE 3.25 Fluid pack lot # [id number]   CATARACT EXTRACTION W/PHACO Right 05/24/2018   Procedure: CATARACT EXTRACTION PHACO AND INTRAOCULAR LENS PLACEMENT ([facility]) RIGHT;  Surgeon: Birder Robson, MD;  Location: [place];  Service: Ophthalmology;  Laterality: Right;  Diabetic - oral meds   CERVICAL FUSION  2000. 2003   CHOLECYSTECTOMY  2000  COLONOSCOPY WITH PROPOFOL N/A 02/23/2017   Procedure: COLONOSCOPY WITH PROPOFOL;  Surgeon: Lollie Sails, MD;  Location: [facility] ENDOSCOPY;  Service: Endoscopy;  Laterality: N/A;   CORONARY ANGIOPLASTY WITH STENT PLACEMENT  2014   [facility], [facility]   ESOPHAGOGASTRODUODENOSCOPY (EGD) WITH PROPOFOL N/A 02/23/2017   Procedure: ESOPHAGOGASTRODUODENOSCOPY (EGD) WITH PROPOFOL;  Surgeon: Lollie Sails, MD;  Location: [facility] ENDOSCOPY;  Service: Endoscopy;  Laterality: N/A;   PULSE GENERATOR IMPLANT N/A 03/04/2020   Procedure: PULSE GENERATOR PLACEMENT;  Surgeon: Deetta Perla, MD;  Location: [facility] OR[facility];  Service: Neurosurgery;  Laterality: N/A;  1ST CASE   SHOULDER ARTHROSCOPY Right 2012   THORACIC  LAMINECTOMY FOR SPINAL CORD STIMULATOR N/A 02/26/2020   Procedure: THORACIC LAMINECTOMY FOR SPINAL CORD STIMULATOR PADDLE TRIAL;  Surgeon: Deetta Perla, MD;  Location: [facility] ORS;  Service: Neurosurgery;  Laterality: N/A;  1ST CASE   TUMOR EXCISION  2007    Family History  Problem Relation Age of Onset   Diabetes Mother    Heart disease Father    Diabetes Father    Diabetes Sister    Heart disease Sister    Diabetes Brother    Heart disease Brother    Heart disease Paternal Grandfather    Cancer Neg Hx     Social History   Socioeconomic History   Marital status: Married    Spouse name: Not on file   Number of children: Not on file   Years of education: Not on file   Highest education level: Not on file  Occupational History   Not on file  Tobacco Use   Smoking status: Every Day    Packs/day: 1.00    Years: 50.00    Pack years: 50.00    Types: Cigarettes    Last attempt to quit: 02/15/2018    Years since quitting: 3.0   Smokeless tobacco: Never  Vaping Use   Vaping Use: Never used  Substance and Sexual Activity   Alcohol use: No   Drug use: No   Sexual activity: Never  Other Topics Concern   Not on file  Social History Narrative   Lives in [place] with wife. Has cat.   Work - Database administrator, retired      Followed at [employer].      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      Diet - Regular   Exercise - starting back at gym   Social Determinants of Health   Financial Resource Strain: Low Risk    Difficulty of Paying Living Expenses: Not hard at all  Food Insecurity: No Food Insecurity   Worried About Charity fundraiser in the Last Year: Never true   Ran Out of Food in the Last Year: Never true  Transportation Needs: No Transportation Needs   Lack of Transportation (Medical): No   Lack of Transportation (Non-Medical): No  Physical Activity: Not on file  Stress: No Stress Concern Present   Feeling of Stress : Not at all  Social  Connections: Unknown   Frequency of Communication with Friends and Family: Not on file   Frequency of Social Gatherings with Friends and Family: Not on file   Attends Religious Services: Not on file   Active Member of Clubs or Organizations: Not on file   Attends Archivist Meetings: Not on file   Marital Status: Married  Intimate Partner Violence: Not At Risk   Fear of Current or Ex-Partner: No  Emotionally Abused: No   Physically Abused: No   Sexually Abused: No    Outpatient Medications Prior to Visit  Medication Sig Dispense Refill   amLODipine (NORVASC) 2.5 MG tablet TAKE ONE TABLET BY MOUTH EVERY DAY 90 tablet 0   ASPIRIN 81 PO TAKE 81 MG BY MOUTH ONCE EVERY DAY     atorvastatin (LIPITOR) 80 MG tablet Take 80 mg by mouth at bedtime.     chlorthalidone (HYGROTON) 50 MG tablet Take 25 mg by mouth daily.     cholecalciferol (VITAMIN D) 1000 UNITS tablet Take 1 tablet (1,000 Units total) by mouth QID. 90 tablet 0   CHOLECALCIFEROL PO Take 4,000 Int'l Units/1.54m2 by mouth daily.     DULoxetine (CYMBALTA) 20 MG capsule Take 20 mg by mouth daily.     fenofibrate (TRICOR) 145 MG tablet Take 1 tablet (145 mg total) by mouth daily. 30 tablet 0   glucose blood test strip Check once daily, E11.9 100 each 12   lidocaine (LIDODERM) 5 % 1 patch daily as needed-Remove & Discard patch within 12 hours or as directed by MD     losartan (COZAAR) 25 MG tablet Take 25 mg by mouth daily.     metFORMIN (GLUCOPHAGE) 1000 MG tablet Take 1,000 mg by mouth 2 (two) times daily with a meal.     methyl salicylate liquid Apply 1 application topically as needed for muscle pain.     nitroGLYCERIN (NITROSTAT) 0.4 MG SL tablet Place 1 tablet (0.4 mg total) under the tongue every 5 (five) minutes as needed for chest pain. May take up to 3 doses. 30 tablet 0   omeprazole (PRILOSEC) 20 MG capsule Take 20 mg by mouth daily.     polyvinyl alcohol (LIQUIFILM TEARS) 1.4 % ophthalmic solution Place 1 drop into  both eyes as needed for dry eyes.      potassium chloride SA (K-DUR,KLOR-CON) 20 MEQ tablet Take 1 tablet (20 mEq total) by mouth daily. 90 tablet 3   pregabalin (LYRICA) 50 MG capsule Take 1 capsule (50 mg total) by mouth 3 (three) times daily. 30 capsule 0   Semaglutide, 1 MG/DOSE, (OZEMPIC, 1 MG/DOSE,) 4 MG/3ML SOPN INJECT 0.75 ML (1 MG) INTO THE SKIN ONCEA WEEK 15 mL 1   tamsulosin (FLOMAX) 0.4 MG CAPS capsule TAKE 1 CAPSULE BY MOUTH ONCE DAILY 30 capsule 3   traMADol (ULTRAM) 50 MG tablet TAKE TWO TABLETS BY MOUTH EVERY 8 HOURS AS NEEDED FOR MODERATE PAIN 90 tablet 0   VASCEPA 1 g capsule TAKE 2 CAPSULES BY MOUTH TWICE DAILY 120 capsule 3   vitamin B-12 (CYANOCOBALAMIN) 1000 MCG tablet Take 1 tablet (1,000 mcg total) by mouth daily. 30 tablet 0   No facility-administered medications prior to visit.    Allergies  Allergen Reactions   Gabapentin Other (See Comments)    "makes me crazy" MENTAL STATUS CHANGES    Isosorbide Mononitrate Er [Isosorbide Dinitrate] Other (See Comments)    Headache   Lisinopril Cough   Tylenol [Acetaminophen] Other (See Comments)    GI upset    Review of Systems  Constitutional: Negative.   HENT: Negative.    Respiratory: Negative.    Cardiovascular: Negative.   Gastrointestinal: Negative.   Genitourinary: Negative.   Musculoskeletal:  Positive for arthralgias, back pain (chronic has stimulator) and myalgias. Negative for gait problem, joint swelling, neck pain and neck stiffness.  Skin: Negative.   Neurological:  Positive for numbness (right arm/ shoulder).  Hematological:  Negative for adenopathy.  Does not bruise/bleed easily.  Psychiatric/Behavioral: Negative.        Objective:   Blood pressure 130/78, pulse 78, temperature 98 F (36.7 C), temperature source Oral, resp. rate 14, height 5\' 10"  (1.778 m), weight 228 lb (103.4 kg), SpO2 97 %.  Physical Exam Constitutional:      General: He is not in acute distress.    Appearance: Normal  appearance. He is not ill-appearing, toxic-appearing or diaphoretic.  HENT:     Head: Normocephalic and atraumatic.     Right Ear: Tympanic membrane normal.     Left Ear: Tympanic membrane normal.     Mouth/Throat:     Mouth: Mucous membranes are moist.  Eyes:     General: No visual field deficit or scleral icterus.       Right eye: No discharge.        Left eye: No discharge.     Conjunctiva/sclera: Conjunctivae normal.     Pupils: Pupils are equal, round, and reactive to light.  Neck:     Vascular: No carotid bruit.  Cardiovascular:     Rate and Rhythm: Normal rate and regular rhythm.     Pulses: Normal pulses.     Heart sounds: Normal heart sounds. No murmur heard.   No friction rub. No gallop.  Pulmonary:     Effort: Pulmonary effort is normal. No respiratory distress.     Breath sounds: Normal breath sounds. No stridor. No wheezing, rhonchi or rales.  Chest:     Chest wall: No tenderness.  Abdominal:     General: There is no distension.     Palpations: Abdomen is soft.     Tenderness: There is no abdominal tenderness.  Musculoskeletal:        General: Tenderness present. No swelling, deformity or signs of injury.     Right shoulder: Tenderness present.     Left shoulder: Normal.     Right upper arm: Normal.     Right elbow: Normal.     Right forearm: Normal.     Right wrist: Normal. Normal pulse (2 + bilateral pulse radial and brachial.).     Left wrist: Normal pulse.       Arms:     Cervical back: Normal range of motion.     Right lower leg: No edema.     Left lower leg: No edema.     Comments: Sensation of left and right arm are within normal.  Bilateral grip strength is 5/5.  No erythema, rash, zoster, or wounds.    Skin:    General: Skin is warm.     Findings: No erythema or rash.  Neurological:     General: No focal deficit present.     Mental Status: He is alert and oriented to person, place, and time.     GCS: GCS eye subscore is 4. GCS verbal subscore  is 5. GCS motor subscore is 6.     Cranial Nerves: Cranial nerves 2-12 are intact. No cranial nerve deficit, dysarthria or facial asymmetry.     Sensory: Sensation is intact. No sensory deficit.     Motor: Motor function is intact. No weakness.     Coordination: Coordination is intact. Romberg sign negative. Coordination normal.     Gait: Gait is intact. Gait and tandem walk normal.     Deep Tendon Reflexes: Reflexes normal.     Reflex Scores:      Tricep reflexes are 2+ on the right side and 2+ on  the left side.      Bicep reflexes are 2+ on the right side and 2+ on the left side.      Patellar reflexes are 2+ on the right side and 2+ on the left side.    Comments: Patient appers well, not sickly. Speaking in complete sentences. Patient moves on and off of exam table and in room without difficulty. Gait is normal in hall and in room. Patient is oriented to person place time and situation. Patient answers questions appropriately and engages eye contact and verbal dialect with provider.    Psychiatric:        Mood and Affect: Mood normal.        Behavior: Behavior normal.        Thought Content: Thought content normal.        Judgment: Judgment normal.    BP 130/78 (BP Location: Left Arm, Patient Position: Sitting, Cuff Size: Small)    Pulse 78    Temp 98 F (36.7 C) (Oral)    Resp 14    Ht 5\' 10"  (1.778 m)    Wt 228 lb (103.4 kg)    SpO2 97%    BMI 32.71 kg/m  Wt Readings from Last 3 Encounters:  02/17/21 228 lb (103.4 kg)  02/07/21 227 lb 6.4 oz (103.1 kg)  01/22/21 221 lb 9.6 oz (100.5 kg)    Health Maintenance Due  Topic Date Due   COVID-19 Vaccine (1) Never done   OPHTHALMOLOGY EXAM  01/26/2021    There are no preventive care reminders to display for this patient.   Lab Results  Component Value Date   TSH 1.86 02/17/2021   Lab Results  Component Value Date   WBC 6.1 02/17/2021   HGB 14.9 02/17/2021   HCT 44.3 02/17/2021   MCV 81.4 02/17/2021   PLT 207.0 02/17/2021    Lab Results  Component Value Date   NA 136 02/17/2021   K 3.7 02/17/2021   CO2 28 02/17/2021   GLUCOSE 158 (H) 02/17/2021   BUN 18 02/17/2021   CREATININE 0.74 02/17/2021   BILITOT 0.6 02/17/2021   ALKPHOS 60 02/17/2021   AST 12 02/17/2021   ALT 16 02/17/2021   PROT 7.3 02/17/2021   ALBUMIN 4.6 02/17/2021   CALCIUM 9.3 02/17/2021   ANIONGAP 10 04/03/2020   GFR 93.91 02/17/2021   Lab Results  Component Value Date   CHOL 187 07/22/2020   Lab Results  Component Value Date   HDL 30.70 (L) 07/22/2020   Lab Results  Component Value Date   LDLCALC 73 10/18/2017   Lab Results  Component Value Date   TRIG 381.0 (H) 07/22/2020   Lab Results  Component Value Date   CHOLHDL 6 07/22/2020   Lab Results  Component Value Date   HGBA1C 7.0 (H) 02/07/2021       Assessment & Plan:   Problem List Items Addressed This Visit       Musculoskeletal and Integument   Degenerative joint disease of right acromioclavicular joint   Relevant Orders   Ambulatory referral to Orthopedic Surgery   Right shoulder tendonitis   Relevant Orders   Ambulatory referral to Orthopedic Surgery     Other   Paresthesia of right arm   Relevant Orders   US Venous Img Upper Uni Right(DVT) (Completed)   B12 and Folate Panel (Completed)   TSH (Completed)   Ambulatory referral to Orthopedic Surgery   Acute pain of right shoulder - Primary   Relevant  Orders   DG Shoulder Right (Completed)   Comprehensive metabolic panel (Completed)   Sedimentation rate (Completed)   EKG 12-Lead (Completed)   CK total and CKMB (cardiac)not at [facility] (Completed)   Troponin I (High Sensitivity) (Completed)   CBC with Differential/Platelet (Completed)   Ambulatory referral to Orthopedic Surgery   History of rotator cuff surgery- right shoulder 2003   Orders Placed This Encounter  Procedures   DG Shoulder Right    Order Specific Question:   Reason for Exam (SYMPTOM  OR DIAGNOSIS REQUIRED)    Answer:   right  arm, anterior shoulder pain.    Order Specific Question:   Preferred imaging location?    Answer:   [place]   US Venous Img Upper Uni Right(DVT)    Order Specific Question:   Reason for Exam (SYMPTOM  OR DIAGNOSIS REQUIRED)    Answer:   rule out upper extremity DVT, pain im right shoulder,    Order Specific Question:   Preferred imaging location?    Answer:   [facility]    Order Specific Question:   Release to patient    Answer:   Immediate    Order Specific Question:   Call Results- Best Contact Number?    Answer:   [phone]   Comprehensive metabolic panel   Sedimentation rate   B12 and Folate Panel   CK total and CKMB (cardiac)not at [facility]   CBC with Differential/Platelet   TSH   Ambulatory referral to Orthopedic Surgery    Referral Priority:   Urgent    Referral Type:   Surgical    Referral Reason:   Specialty Services Required    Requested Specialty:   Orthopedic Surgery    Number of Visits Requested:   1   EKG 12-Lead  Suspect musculoskeletal origin/orthopedics patient does have significant tenderness as documented on exam on his shoulder.  Pain is reproducible. Do not expect this to be cardiac in origin as he has no other symptoms no shortness of breath no cardiac symptoms.  EKG was reviewed and patient is in first-degree heart block, this is chronic for him he did have elevated PRI level with EKG.  Discussed with Harrell Gave and an NP at cardiology and both in agreement this seems to be musculoskeletal and as long as reproducible then he can keep his normal scheduled appointment cardiology.  Red Flags discussed. The patient was given clear instructions to go to ER or return to medical center if any red flags develop, symptoms do not improve, worsen or new problems develop. They verbalized understanding.    Return in about 2 days (around 02/19/2021), or if symptoms worsen or fail to improve, for at any time for any worsening symptoms, Go to Emergency  room/ urgent care if worse.  No orders of the defined types were placed in this encounter.    Marcille Buffy, FNP

## 2021-02-17 NOTE — Progress Notes (Signed)
Right shoulder x rays does show changes consistent with Tendonitis calcified of distal rotator cuff of shoulder is seen, AC joint changes seen.  Advise him to go to [facility] for further evaluation of shoulder, he may need MRI and they may can do an injection to get him some relief. Follow up back herein one week.  He can walk in at [facility] at [place] today.

## 2021-02-18 LAB — CK TOTAL AND CKMB (NOT AT ARMC)
CK, MB: 1.1 ng/mL (ref 0–5.0)
Relative Index: 1.3 (ref 0–4.0)
Total CK: 82 U/L (ref 44–196)

## 2021-02-18 NOTE — Progress Notes (Signed)
Cardiac enzyme CKMB is negative.

## 2021-02-19 ENCOUNTER — Telehealth: Payer: Self-pay | Admitting: Family Medicine

## 2021-02-19 ENCOUNTER — Ambulatory Visit: Payer: Medicare Other | Admitting: Adult Health

## 2021-02-19 DIAGNOSIS — G5621 Lesion of ulnar nerve, right upper limb: Secondary | ICD-10-CM | POA: Diagnosis not present

## 2021-02-19 NOTE — Telephone Encounter (Signed)
Patient called and was suppose to go to [facility] for a  injection in his shoulder. [facility] never received order because fax is down. Please FAX order to this number [phone]. Per patient, Laverna Peace ordered injection.

## 2021-02-20 ENCOUNTER — Encounter: Payer: Self-pay | Admitting: Adult Health

## 2021-02-20 DIAGNOSIS — G5621 Lesion of ulnar nerve, right upper limb: Secondary | ICD-10-CM | POA: Diagnosis not present

## 2021-02-20 DIAGNOSIS — G5601 Carpal tunnel syndrome, right upper limb: Secondary | ICD-10-CM | POA: Diagnosis not present

## 2021-02-25 ENCOUNTER — Ambulatory Visit: Payer: Medicare Other | Admitting: Adult Health

## 2021-02-26 ENCOUNTER — Observation Stay
Admission: EM | Admit: 2021-02-26 | Discharge: 2021-02-27 | Disposition: A | Discharge status: Home / Self Care | Payer: Medicare Other | Attending: Student in an Organized Health Care Education/Training Program | Admitting: Student in an Organized Health Care Education/Training Program

## 2021-02-26 ENCOUNTER — Emergency Department: Payer: Medicare Other

## 2021-02-26 ENCOUNTER — Other Ambulatory Visit: Payer: Self-pay

## 2021-02-26 ENCOUNTER — Observation Stay: Payer: Medicare Other

## 2021-02-26 ENCOUNTER — Encounter: Payer: Self-pay | Admitting: Emergency Medicine

## 2021-02-26 DIAGNOSIS — I639 Cerebral infarction, unspecified: Secondary | ICD-10-CM | POA: Diagnosis not present

## 2021-02-26 DIAGNOSIS — E119 Type 2 diabetes mellitus without complications: Secondary | ICD-10-CM | POA: Insufficient documentation

## 2021-02-26 DIAGNOSIS — I63532 Cerebral infarction due to unspecified occlusion or stenosis of left posterior cerebral artery: Secondary | ICD-10-CM | POA: Diagnosis not present

## 2021-02-26 DIAGNOSIS — J341 Cyst and mucocele of nose and nasal sinus: Secondary | ICD-10-CM | POA: Diagnosis not present

## 2021-02-26 DIAGNOSIS — I63233 Cerebral infarction due to unspecified occlusion or stenosis of bilateral carotid arteries: Secondary | ICD-10-CM | POA: Diagnosis not present

## 2021-02-26 DIAGNOSIS — I110 Hypertensive heart disease with heart failure: Secondary | ICD-10-CM | POA: Insufficient documentation

## 2021-02-26 DIAGNOSIS — M4322 Fusion of spine, cervical region: Secondary | ICD-10-CM | POA: Diagnosis not present

## 2021-02-26 DIAGNOSIS — Z20822 Contact with and (suspected) exposure to covid-19: Secondary | ICD-10-CM | POA: Insufficient documentation

## 2021-02-26 DIAGNOSIS — G4733 Obstructive sleep apnea (adult) (pediatric): Secondary | ICD-10-CM | POA: Diagnosis not present

## 2021-02-26 DIAGNOSIS — I712 Thoracic aortic aneurysm, without rupture, unspecified: Secondary | ICD-10-CM | POA: Diagnosis not present

## 2021-02-26 DIAGNOSIS — Z794 Long term (current) use of insulin: Secondary | ICD-10-CM | POA: Insufficient documentation

## 2021-02-26 DIAGNOSIS — R200 Anesthesia of skin: Secondary | ICD-10-CM | POA: Diagnosis not present

## 2021-02-26 DIAGNOSIS — E785 Hyperlipidemia, unspecified: Secondary | ICD-10-CM | POA: Diagnosis not present

## 2021-02-26 DIAGNOSIS — R202 Paresthesia of skin: Secondary | ICD-10-CM | POA: Diagnosis not present

## 2021-02-26 DIAGNOSIS — F17210 Nicotine dependence, cigarettes, uncomplicated: Secondary | ICD-10-CM | POA: Diagnosis not present

## 2021-02-26 DIAGNOSIS — J3489 Other specified disorders of nose and nasal sinuses: Secondary | ICD-10-CM | POA: Diagnosis not present

## 2021-02-26 DIAGNOSIS — I2510 Atherosclerotic heart disease of native coronary artery without angina pectoris: Secondary | ICD-10-CM | POA: Diagnosis not present

## 2021-02-26 DIAGNOSIS — I6381 Other cerebral infarction due to occlusion or stenosis of small artery: Principal | ICD-10-CM | POA: Insufficient documentation

## 2021-02-26 DIAGNOSIS — I719 Aortic aneurysm of unspecified site, without rupture: Secondary | ICD-10-CM | POA: Diagnosis not present

## 2021-02-26 DIAGNOSIS — Q283 Other malformations of cerebral vessels: Secondary | ICD-10-CM | POA: Diagnosis not present

## 2021-02-26 DIAGNOSIS — G8921 Chronic pain due to trauma: Secondary | ICD-10-CM | POA: Diagnosis not present

## 2021-02-26 DIAGNOSIS — Z85828 Personal history of other malignant neoplasm of skin: Secondary | ICD-10-CM | POA: Diagnosis not present

## 2021-02-26 DIAGNOSIS — I63312 Cerebral infarction due to thrombosis of left middle cerebral artery: Secondary | ICD-10-CM | POA: Diagnosis not present

## 2021-02-26 DIAGNOSIS — I5032 Chronic diastolic (congestive) heart failure: Secondary | ICD-10-CM | POA: Diagnosis not present

## 2021-02-26 DIAGNOSIS — Z7984 Long term (current) use of oral hypoglycemic drugs: Secondary | ICD-10-CM | POA: Insufficient documentation

## 2021-02-26 DIAGNOSIS — Z79899 Other long term (current) drug therapy: Secondary | ICD-10-CM | POA: Diagnosis not present

## 2021-02-26 DIAGNOSIS — Z7982 Long term (current) use of aspirin: Secondary | ICD-10-CM | POA: Diagnosis not present

## 2021-02-26 DIAGNOSIS — Z8673 Personal history of transient ischemic attack (TIA), and cerebral infarction without residual deficits: Secondary | ICD-10-CM | POA: Diagnosis present

## 2021-02-26 DIAGNOSIS — I63432 Cerebral infarction due to embolism of left posterior cerebral artery: Secondary | ICD-10-CM | POA: Diagnosis not present

## 2021-02-26 DIAGNOSIS — E782 Mixed hyperlipidemia: Secondary | ICD-10-CM | POA: Diagnosis not present

## 2021-02-26 DIAGNOSIS — K219 Gastro-esophageal reflux disease without esophagitis: Secondary | ICD-10-CM | POA: Diagnosis not present

## 2021-02-26 DIAGNOSIS — F17200 Nicotine dependence, unspecified, uncomplicated: Secondary | ICD-10-CM

## 2021-02-26 DIAGNOSIS — I10 Essential (primary) hypertension: Secondary | ICD-10-CM

## 2021-02-26 DIAGNOSIS — Z955 Presence of coronary angioplasty implant and graft: Secondary | ICD-10-CM | POA: Diagnosis not present

## 2021-02-26 DIAGNOSIS — G936 Cerebral edema: Secondary | ICD-10-CM | POA: Diagnosis not present

## 2021-02-26 DIAGNOSIS — E871 Hypo-osmolality and hyponatremia: Secondary | ICD-10-CM | POA: Insufficient documentation

## 2021-02-26 DIAGNOSIS — Z981 Arthrodesis status: Secondary | ICD-10-CM | POA: Diagnosis not present

## 2021-02-26 DIAGNOSIS — M5136 Other intervertebral disc degeneration, lumbar region: Secondary | ICD-10-CM | POA: Diagnosis not present

## 2021-02-26 DIAGNOSIS — E041 Nontoxic single thyroid nodule: Secondary | ICD-10-CM | POA: Diagnosis not present

## 2021-02-26 LAB — LIPID PANEL
Cholesterol: 193 mg/dL (ref 0–200)
HDL: 32 mg/dL — ABNORMAL LOW (ref >40)
LDL Cholesterol: UNDETERMINED mg/dL (ref 0–99)
Total CHOL/HDL Ratio: 6 RATIO
Triglycerides: 552 mg/dL — ABNORMAL HIGH (ref <150)
VLDL: UNDETERMINED mg/dL (ref 0–40)

## 2021-02-26 LAB — CBC
HCT: 47.2 % (ref 39.0–52.0)
Hemoglobin: 15.8 g/dL (ref 13.0–17.0)
MCH: 27.6 pg (ref 26.0–34.0)
MCHC: 33.5 g/dL (ref 30.0–36.0)
MCV: 82.4 fL (ref 80.0–100.0)
Platelets: 252 10*3/uL (ref 150–400)
RBC: 5.73 MIL/uL (ref 4.22–5.81)
RDW: 14.3 % (ref 11.5–15.5)
WBC: 6.8 10*3/uL (ref 4.0–10.5)
nRBC: 0 % (ref 0.0–0.2)

## 2021-02-26 LAB — DIFFERENTIAL
Abs Immature Granulocytes: 0.03 10*3/uL (ref 0.00–0.07)
Basophils Absolute: 0.1 10*3/uL (ref 0.0–0.1)
Basophils Relative: 1 %
Eosinophils Absolute: 0.2 10*3/uL (ref 0.0–0.5)
Eosinophils Relative: 3 %
Immature Granulocytes: 0 %
Lymphocytes Relative: 18 %
Lymphs Abs: 1.2 10*3/uL (ref 0.7–4.0)
Monocytes Absolute: 0.5 10*3/uL (ref 0.1–1.0)
Monocytes Relative: 7 %
Neutro Abs: 4.8 10*3/uL (ref 1.7–7.7)
Neutrophils Relative %: 71 %

## 2021-02-26 LAB — COMPREHENSIVE METABOLIC PANEL
ALT: 21 U/L (ref 0–44)
AST: 21 U/L (ref 15–41)
Albumin: 4.8 g/dL (ref 3.5–5.0)
Alkaline Phosphatase: 71 U/L (ref 38–126)
Anion gap: 10 (ref 5–15)
BUN: 23 mg/dL (ref 8–23)
CO2: 27 mmol/L (ref 22–32)
Calcium: 9.4 mg/dL (ref 8.9–10.3)
Chloride: 95 mmol/L — ABNORMAL LOW (ref 98–111)
Creatinine, Ser: 0.86 mg/dL (ref 0.61–1.24)
GFR, Estimated: >60 mL/min (ref >60)
Glucose, Bld: 238 mg/dL — ABNORMAL HIGH (ref 70–99)
Potassium: 3.7 mmol/L (ref 3.5–5.1)
Sodium: 132 mmol/L — ABNORMAL LOW (ref 135–145)
Total Bilirubin: 1 mg/dL (ref 0.3–1.2)
Total Protein: 8 g/dL (ref 6.5–8.1)

## 2021-02-26 LAB — HEMOGLOBIN A1C
Hgb A1c MFr Bld: 6.5 % — ABNORMAL HIGH (ref 4.8–5.6)
Mean Plasma Glucose: 139.85 mg/dL

## 2021-02-26 LAB — CBG MONITORING, ED: Glucose-Capillary: 165 mg/dL — ABNORMAL HIGH (ref 70–99)

## 2021-02-26 LAB — RESP PANEL BY RT-PCR (FLU A&B, COVID) ARPGX2
Influenza A by PCR: NEGATIVE
Influenza B by PCR: NEGATIVE
SARS Coronavirus 2 by RT PCR: NEGATIVE

## 2021-02-26 LAB — LDL CHOLESTEROL, DIRECT: Direct LDL: 94.2 mg/dL (ref 0–99)

## 2021-02-26 LAB — APTT: aPTT: 31 seconds (ref 24–36)

## 2021-02-26 LAB — PROTIME-INR
INR: 1 (ref 0.8–1.2)
Prothrombin Time: 12.8 seconds (ref 11.4–15.2)

## 2021-02-26 LAB — GLUCOSE, CAPILLARY: Glucose-Capillary: 193 mg/dL — ABNORMAL HIGH (ref 70–99)

## 2021-02-26 MED ORDER — ICOSAPENT ETHYL 1 G PO CAPS
2.0000 g | ORAL_CAPSULE | Freq: Two times a day (BID) | ORAL | Status: DC
  Administered 2021-02-26 – 2021-02-27 (×2): 2 g via ORAL
  Filled 2021-02-26 (×4): qty 2

## 2021-02-26 MED ORDER — ASPIRIN 81 MG PO CHEW
81.0000 mg | CHEWABLE_TABLET | Freq: Every day | ORAL | Status: DC
  Administered 2021-02-26 – 2021-02-27 (×2): 81 mg via ORAL
  Filled 2021-02-26 (×2): qty 1

## 2021-02-26 MED ORDER — ACETAMINOPHEN 650 MG RE SUPP: 650.0000 mg | RECTAL | Status: DC | PRN

## 2021-02-26 MED ORDER — CHLORTHALIDONE 25 MG PO TABS
25.0000 mg | ORAL_TABLET | Freq: Every day | ORAL | Status: DC
  Administered 2021-02-27: 25 mg via ORAL
  Filled 2021-02-26: qty 1

## 2021-02-26 MED ORDER — PREGABALIN 50 MG PO CAPS
50.0000 mg | ORAL_CAPSULE | Freq: Three times a day (TID) | ORAL | Status: DC
  Administered 2021-02-26 – 2021-02-27 (×3): 50 mg via ORAL
  Filled 2021-02-26 (×3): qty 1

## 2021-02-26 MED ORDER — ACETAMINOPHEN 160 MG/5ML PO SOLN
650.0000 mg | ORAL | Status: DC | PRN
  Filled 2021-02-26: qty 20.3

## 2021-02-26 MED ORDER — DULOXETINE HCL 20 MG PO CPEP
20.0000 mg | ORAL_CAPSULE | Freq: Every day | ORAL | Status: DC
Start: 2021-02-27 — End: 2021-02-27
  Administered 2021-02-27: 20 mg via ORAL
  Filled 2021-02-26: qty 1

## 2021-02-26 MED ORDER — LOSARTAN POTASSIUM 25 MG PO TABS
25.0000 mg | ORAL_TABLET | Freq: Every day | ORAL | Status: DC
  Administered 2021-02-27: 25 mg via ORAL
  Filled 2021-02-26: qty 1

## 2021-02-26 MED ORDER — PANTOPRAZOLE SODIUM 40 MG PO TBEC
40.0000 mg | DELAYED_RELEASE_TABLET | Freq: Every day | ORAL | Status: DC
  Administered 2021-02-27: 09:00:00 40 mg via ORAL
  Filled 2021-02-26: qty 1

## 2021-02-26 MED ORDER — CLOPIDOGREL BISULFATE 75 MG PO TABS
75.0000 mg | ORAL_TABLET | Freq: Every day | ORAL | Status: DC
  Administered 2021-02-26 – 2021-02-27 (×2): 75 mg via ORAL
  Filled 2021-02-26 (×2): qty 1

## 2021-02-26 MED ORDER — ACETAMINOPHEN 325 MG PO TABS
650.0000 mg | ORAL_TABLET | ORAL | Status: DC | PRN
  Filled 2021-02-26: qty 2

## 2021-02-26 MED ORDER — SENNOSIDES-DOCUSATE SODIUM 8.6-50 MG PO TABS: 1.0000 | ORAL_TABLET | Freq: Every evening | ORAL | Status: DC | PRN

## 2021-02-26 MED ORDER — POLYVINYL ALCOHOL 1.4 % OP SOLN
1.0000 [drp] | OPHTHALMIC | Status: DC | PRN
  Filled 2021-02-26: qty 15

## 2021-02-26 MED ORDER — AMLODIPINE BESYLATE 5 MG PO TABS
2.5000 mg | ORAL_TABLET | Freq: Every day | ORAL | Status: DC
  Administered 2021-02-27: 09:00:00 2.5 mg via ORAL
  Filled 2021-02-26: qty 1

## 2021-02-26 MED ORDER — TAMSULOSIN HCL 0.4 MG PO CAPS
0.4000 mg | ORAL_CAPSULE | Freq: Every day | ORAL | Status: DC
  Administered 2021-02-27: 0.4 mg via ORAL
  Filled 2021-02-26: qty 1

## 2021-02-26 MED ORDER — VITAMIN B-12 1000 MCG PO TABS
1000.0000 ug | ORAL_TABLET | Freq: Every day | ORAL | Status: DC
  Administered 2021-02-27: 1000 ug via ORAL
  Filled 2021-02-26: qty 1

## 2021-02-26 MED ORDER — ATORVASTATIN CALCIUM 20 MG PO TABS
80.0000 mg | ORAL_TABLET | Freq: Every day | ORAL | Status: DC
  Administered 2021-02-26: 22:00:00 80 mg via ORAL
  Filled 2021-02-26: qty 4

## 2021-02-26 MED ORDER — IOHEXOL 350 MG/ML SOLN
75.0000 mL | Freq: Once | INTRAVENOUS | Status: AC | PRN
  Administered 2021-02-26: 75 mL via INTRAVENOUS

## 2021-02-26 MED ORDER — INSULIN ASPART 100 UNIT/ML IJ SOLN
0.0000 [IU] | Freq: Three times a day (TID) | INTRAMUSCULAR | Status: DC
  Administered 2021-02-27: 09:00:00 3 [IU] via SUBCUTANEOUS
  Filled 2021-02-26: qty 1

## 2021-02-26 MED ORDER — LIDOCAINE 5 % EX PTCH
1.0000 | MEDICATED_PATCH | CUTANEOUS | Status: DC
  Administered 2021-02-26: 1 via TRANSDERMAL
  Filled 2021-02-26 (×2): qty 1

## 2021-02-26 MED ORDER — TRAMADOL HCL 50 MG PO TABS
50.0000 mg | ORAL_TABLET | Freq: Four times a day (QID) | ORAL | Status: DC | PRN
  Administered 2021-02-26 – 2021-02-27 (×2): 50 mg via ORAL
  Filled 2021-02-26 (×2): qty 1

## 2021-02-26 MED ORDER — FENOFIBRATE 54 MG PO TABS
54.0000 mg | ORAL_TABLET | Freq: Every day | ORAL | Status: DC
  Administered 2021-02-27: 09:00:00 54 mg via ORAL
  Filled 2021-02-26: qty 1

## 2021-02-26 MED ORDER — ENOXAPARIN SODIUM 60 MG/0.6ML IJ SOSY
0.5000 mg/kg | PREFILLED_SYRINGE | INTRAMUSCULAR | Status: DC
  Administered 2021-02-26: 22:00:00 52.5 mg via SUBCUTANEOUS
  Filled 2021-02-26: qty 0.6

## 2021-02-26 MED ORDER — STROKE: EARLY STAGES OF RECOVERY BOOK: Freq: Once | Status: DC

## 2021-02-26 NOTE — Consult Note (Signed)
Neurology Consult H&P  JUNO BOZARD MR# [id number] 02/26/2021   CC: right sided numbness last week.  History is obtained from: patient and chart.  HPI: Walter Salas is a 66 y.o. male PMHx as reviewed below right-sided facial numbness, and right arm and leg numbness which began last week in his arm. By Monday 03/24/2021 numbness was noted in his right face and the entire right side of his body.    Denies slurred speech, vision/hearing changes.  LKW: unclear tNK given: No OSW IR Thrombectomy No, not candidate Modified Rankin Scale: 0-Completely asymptomatic and back to baseline post- stroke NIHSS: 1  ROS: A complete ROS was performed and is negative except as noted in the HPI.   Past Medical History:  Diagnosis Date   (HFpEF) heart failure with preserved ejection fraction ([place])    a. 2018 Echo: EF 60-65%, normal wall motion, Gr1DD, mild aortic valve calcification; b. 12/2017 Echo: EF 60-65%, no rwma, Ao root 4.3cm, Asc Ao 4.4 cm, mildly dil LA.   Agent orange exposure    Anginal pain ([place])    Arthritis    CAD (coronary artery disease)    a. s/p PCI/DES to the LCx and RCA in 2014; b. 05/2016 MV: EF 55-65%. Small, mild defect in basal anterolateral location->infarct w/o ischemia.   Cancer ([place])    skin   Chronic cough    Chronic pain    Colon polyp    Depression    Diabetes mellitus without complication (HCC)    Diverticulosis    GERD (gastroesophageal reflux disease)    History of kidney stones    History of stomach ulcers    History of subdural hematoma 12/14/2012   HOH (hard of hearing)    Hx of laminectomy 1999   Hyperlipidemia    Hypertension    Myocardial infarction [facility])    Sleep apnea    Syncope    a. Event monitor 2018: NSR with first-degree AV block, no significant arrhythmias or pauses; b.  Myoview showed a small in size, mild in severity fixed basal and anterior lateral defect, no ischemia, LVEF 55 to 65%, low risk study   Thoracic aortic aneurysm    a. TTE  2018: Mildly dil Ao root- 4.2 cm; b. CTA aorta 6/18: Asc Ao aneurysmal dil w max diam of 4.3 cm; b. 12/2019 CTA Chest: stable 4.3cm thor Ao Aneurysm.   Ulcer    Family History  Problem Relation Age of Onset   Diabetes Mother    Heart disease Father    Diabetes Father    Diabetes Sister    Heart disease Sister    Diabetes Brother    Heart disease Brother    Heart disease Paternal Grandfather    Cancer Neg Hx    Social History:  reports that he has been smoking cigarettes. He has a 50.00 pack-year smoking history. He has never used smokeless tobacco. He reports that he does not drink alcohol and does not use drugs.  Prior to Admission medications   Medication Sig Start Date End Date Taking? Authorizing Provider  amLODipine (NORVASC) 2.5 MG tablet TAKE ONE TABLET BY MOUTH EVERY DAY 11/04/20   End, Harrell Gave, MD  ASPIRIN 81 PO TAKE 81 MG BY MOUTH ONCE EVERY DAY 09/26/12   [provider]  atorvastatin (LIPITOR) 80 MG tablet Take 80 mg by mouth at bedtime.    [provider]  chlorthalidone (HYGROTON) 50 MG tablet Take 25 mg by mouth daily.    [provider]  cholecalciferol (VITAMIN D) 1000 UNITS tablet Take 1 tablet (1,000 Units total) by mouth QID. 11/15/14   Jackolyn Confer, MD  CHOLECALCIFEROL PO Take 4,000 Int'l Units/1.41m2 by mouth daily.    [provider]  DULoxetine (CYMBALTA) 20 MG capsule Take 20 mg by mouth daily.    [provider]  fenofibrate (TRICOR) 145 MG tablet Take 1 tablet (145 mg total) by mouth daily. 11/15/14   Jackolyn Confer, MD  glucose blood test strip Check once daily, E11.9 02/22/18   Leone Haven, MD  lidocaine (LIDODERM) 5 % 1 patch daily as needed-Remove & Discard patch within 12 hours or as directed by MD    [provider]  losartan (COZAAR) 25 MG tablet Take 25 mg by mouth daily.    [provider]  metFORMIN (GLUCOPHAGE) 1000 MG tablet Take 1,000 mg by mouth 2 (two) times daily  with a meal.    [provider]  methyl salicylate liquid Apply 1 application topically as needed for muscle pain.    [provider]  nitroGLYCERIN (NITROSTAT) 0.4 MG SL tablet Place 1 tablet (0.4 mg total) under the tongue every 5 (five) minutes as needed for chest pain. May take up to 3 doses. 01/15/20   Leone Haven, MD  omeprazole (PRILOSEC) 20 MG capsule Take 20 mg by mouth daily.    [provider]  polyvinyl alcohol (LIQUIFILM TEARS) 1.4 % ophthalmic solution Place 1 drop into both eyes as needed for dry eyes.     [provider]  potassium chloride SA (K-DUR,KLOR-CON) 20 MEQ tablet Take 1 tablet (20 mEq total) by mouth daily. 06/25/16   End, Harrell Gave, MD  pregabalin (LYRICA) 50 MG capsule Take 1 capsule (50 mg total) by mouth 3 (three) times daily. 12/04/18   McLean-Scocuzza, Nino Glow, MD  Semaglutide, 1 MG/DOSE, (OZEMPIC, 1 MG/DOSE,) 4 MG/3ML SOPN INJECT 0.75 ML (1 MG) INTO THE SKIN ONCEA WEEK 05/07/20   Leone Haven, MD  tamsulosin (FLOMAX) 0.4 MG CAPS capsule TAKE 1 CAPSULE BY MOUTH ONCE DAILY 11/04/20   Leone Haven, MD  traMADol (ULTRAM) 50 MG tablet TAKE TWO TABLETS BY MOUTH EVERY 8 HOURS AS NEEDED FOR MODERATE PAIN 02/07/21   Leone Haven, MD  VASCEPA 1 g capsule TAKE 2 CAPSULES BY MOUTH TWICE DAILY 09/25/20   Leone Haven, MD  vitamin B-12 (CYANOCOBALAMIN) 1000 MCG tablet Take 1 tablet (1,000 mcg total) by mouth daily. 11/15/14   Jackolyn Confer, MD    Exam: Current vital signs: BP (!) 142/75    Pulse 79    Temp 98.4 F (36.9 C) (Oral)    Resp 14    Ht 5\' 10"  (1.778 m)    Wt 103.4 kg    SpO2 94%    BMI 32.71 kg/m   Physical Exam  Constitutional: Appears well-developed and well-nourished.  Psych: Affect appropriate to situation Eyes: No scleral injection HENT: No OP obstruction. Head: Normocephalic.  Cardiovascular: Normal rate and regular rhythm.  Respiratory: Effort normal, symmetric excursions bilaterally,  no audible wheezing. GI: Soft.  No distension. There is no tenderness.  Skin: WDI  Neuro: Mental Status: Patient is awake, alert, oriented to person, place, month, year, and situation. Patient is able to give a clear and coherent history. Speech fluent, intact comprehension and repetition. No signs of aphasia or neglect. Visual Fields are full. Pupils are equal, round, and reactive to light. EOMI without ptosis or diplopia.  Facial  sensation is decreased on the right to temperature Facial movement is symmetric.  Hearing is intact to voice. Uvula midline and palate elevates symmetrically. Shoulder shrug is symmetric. Tongue is midline without atrophy or fasciculations.  Tone is normal. Bulk is normal. 5/5 strength was present in all four extremities. Sensation is decrease in right arm and leg to temperature in the arms and legs. Deep Tendon Reflexes: 2+ and symmetric in the biceps and patellae. Toes are downgoing bilaterally. FNF and HKS are intact bilaterally. Gait - Deferred  I have reviewed labs in epic and the pertinent results are: Glu 238  I have reviewed the images obtained: NCT head showed age indeterminate lacunar infarct in the left thalamus CTA head and neck showed mild-to-moderate atherosclerosis in the head and neck without large vessel occlusion, significant proximal stenosis, or aneurysm. Aortic Atherosclerosis (ICD10-I70.0).  Assessment: CANDIDO FLOTT is a 66 y.o. male PMHx as noted above with significant vascular risk factors now with late acute embolic lacunar stroke to the left thalamus.   Impression:  Acute embolic stroke left thalamus HTN CAD HLD Thoracic aortic aneurysm Aortic Atherosclerosis (ICD10-I70.0) Current smoker Remote SDH   Plan: - MRI brain without contrast - Pending. - Recommend TTE. - Recommend labs: HbA1c, lipid panel - pending. - Recommend Statin if LDL > 70 - Continue aspirin 81mg  daily. - Clopidogrel 75mg  daily for 3 weeks. -  SBP goal <140. - Telemetry monitoring for arrhythmia. - Recommend bedside Swallow screen. - Recommend Stroke education. - Recommend PT/OT/SLP consult.  This patient is critically ill and at significant risk of neurological worsening, death and care requires constant monitoring of vital signs, hemodynamics,respiratory and cardiac monitoring, neurological assessment, discussion with family, other specialists and medical decision making of high complexity. I spent 73 minutes of neurocritical care time  in the care of  this patient. This was time spent independent of any time provided by nurse practitioner or PA.  Electronically signed by:  Lynnae Sandhoff, MD Page: [phone] 02/26/2021, 1:22 PM  If 7pm- 7am, please page neurology on call as listed in [place].

## 2021-02-26 NOTE — ED Notes (Signed)
Pt alert, NAD, calm, interactive, resps e/u, speaking in clear complete sentences, c/o painful numbness to R side head, arm, trunk and leg, onset 1 week ago, denies other sx. Family at [facility].

## 2021-02-26 NOTE — Progress Notes (Signed)
PHARMACIST - PHYSICIAN COMMUNICATION  CONCERNING:  Enoxaparin (Lovenox) for DVT Prophylaxis   RECOMMENDATION: Patient was prescribed enoxaparin 40mg  q24 hours for VTE prophylaxis.   Filed Weights   02/26/21 1049  Weight: 103.4 kg (228 lb)    Body mass index is 32.71 kg/m.  Estimated Creatinine Clearance: 100.4 mL/min (by C-G formula based on SCr of 0.86 mg/dL).   Based on [place] patient is candidate for enoxaparin 0.5mg /kg TBW SQ every 24 hours based on BMI being >30.  DESCRIPTION: Pharmacy has adjusted enoxaparin dose per [facility] policy.  Patient is now receiving enoxaparin 52.5 mg every 24 hours   Benita Gutter 02/26/2021 3:11 PM

## 2021-02-26 NOTE — ED Provider Notes (Signed)
[facility] Provider Note    Event Date/Time   First MD Initiated Contact with Patient 02/26/21 1204     (approximate)   History   Numbness   HPI  Walter Salas is a 66 y.o. male with history of diabetes, hypertension, coronary artery disease, thoracic aneurysm presents emergency department with right-sided facial numbness, and right arm and leg numbness.  Patient states symptoms started last week with his arm and on Monday he was numb in the face and on the entire right side.  He denies any slurred speech at this time.  No change in his vision.  States he was seen at [facility] then [facility], [facility] explained to him that makes be something more serious and he should come the emergency department.  He denies any chest pain or shortness of breath.  Patient does take a baby aspirin per day.      Physical Exam   Triage Vital Signs: ED Triage Vitals  Enc Vitals Group     BP 02/26/21 1048 136/72     Pulse Rate 02/26/21 1048 88     Resp 02/26/21 1048 17     Temp 02/26/21 1048 98.4 F (36.9 C)     Temp Source 02/26/21 1048 Oral     SpO2 02/26/21 1048 92 %     Weight 02/26/21 1049 228 lb (103.4 kg)     Height 02/26/21 1049 5\' 10"  (1.778 m)     Head Circumference --      Peak Flow --      Pain Score 02/26/21 1048 10     Pain Loc --      Pain Edu? --      Excl. in [place]? --     Most recent vital signs: Vitals:   02/26/21 1500 02/26/21 1515  BP: 133/77 138/72  Pulse: 83 81  Resp: 16 16  Temp:    SpO2: 94% 92%     General: Awake, no distress.   CV:  Good peripheral perfusion. regular rate and  rhythm Resp:  Normal effort. Lungs CTA Abd:  No distention.   Other:  Slight facial droop noted on the right side, patient still able to smile, frown, raises eyebrows, grip on the right side is weak when compared to the left, good strength in lower extremities, neurovascular is intact   ED Results / Procedures /  Treatments   Labs (all labs ordered are listed, but only abnormal results are displayed) Labs Reviewed  COMPREHENSIVE METABOLIC PANEL - Abnormal; Notable for the following components:      Result Value   Sodium 132 (*)    Chloride 95 (*)    Glucose, Bld 238 (*)    All other components within normal limits  RESP PANEL BY RT-PCR (FLU A&B, COVID) ARPGX2  PROTIME-INR  APTT  CBC  DIFFERENTIAL  LIPID PANEL  HEMOGLOBIN A1C  HIV ANTIBODY (ROUTINE TESTING W REFLEX)     EKG  Normal sinus rhythm, see physician read   RADIOLOGY CT of the head MRI of the brain     PROCEDURES:   Procedures   MEDICATIONS ORDERED IN ED: Medications  aspirin chewable tablet 81 mg (has no administration in time range)  traMADol (ULTRAM) tablet 50 mg (has no administration in time range)  amLODipine (NORVASC) tablet 2.5 mg (has no administration in time range)  atorvastatin (LIPITOR) tablet 80 mg (has no administration in time range)  chlorthalidone (HYGROTON) tablet 25  mg (has no administration in time range)  fenofibrate tablet 54 mg (has no administration in time range)  losartan (COZAAR) tablet 25 mg (has no administration in time range)  icosapent Ethyl (VASCEPA) 1 g capsule 2 g (has no administration in time range)  DULoxetine (CYMBALTA) DR capsule 20 mg (has no administration in time range)  pantoprazole (PROTONIX) EC tablet 40 mg (has no administration in time range)  tamsulosin (FLOMAX) capsule 0.4 mg (has no administration in time range)  vitamin B-12 (CYANOCOBALAMIN) tablet 1,000 mcg (has no administration in time range)  pregabalin (LYRICA) capsule 50 mg (has no administration in time range)  lidocaine (LIDODERM) 5 % 1 patch (has no administration in time range)  polyvinyl alcohol (LIQUIFILM TEARS) 1.4 % ophthalmic solution 1 drop (has no administration in time range)   stroke: mapping our early stages of recovery book (has no administration in time range)  acetaminophen (TYLENOL)  tablet 650 mg (has no administration in time range)    Or  acetaminophen (TYLENOL) 160 MG/5ML solution 650 mg (has no administration in time range)    Or  acetaminophen (TYLENOL) suppository 650 mg (has no administration in time range)  senna-docusate (Senokot-S) tablet 1 tablet (has no administration in time range)  enoxaparin (LOVENOX) injection 52.5 mg (has no administration in time range)  insulin aspart (novoLOG) injection 0-15 Units (has no administration in time range)  clopidogrel (PLAVIX) tablet 75 mg (has no administration in time range)  iohexol (OMNIPAQUE) 350 MG/ML injection 75 mL (75 mLs Intravenous Contrast Given 02/26/21 1400)     IMPRESSION / MDM / ASSESSMENT AND PLAN / ED COURSE  I reviewed the triage vital signs and the nursing notes.                             Patient with history of hypertension, diabetes, CAD, concerns for recent CVA  Differential diagnosis includes, but is not limited to, CVA, Bell's palsy, SAH, subdural hematoma, B12 deficiency  Labs are reassuring at this time patient's PT PTT are normal, CBC is normal, comprehensive metabolic panel does show elevated glucose but patient is diabetic, level glucose is at 238 today which is higher than his norm.  CT of the head does show a lacunar infarct, independently reviewed by me but was read by radiology.  The radiologist indicates that this may be a recent ischemic event.  The patient has a nerve stimulator.  Model number researched by MRI.  Patient will need to get his remote to turn the nerve stimulator to MRI mode.  He is asking someone from home to get the remote.  Ordered MRI of the brain.  Consult to hospitalist Consult to neurology  Hospitalist notified of patient's condition while we are awaiting MRI.  Secure message sent to neuro to consult on the floor. Patient is aware of being admitted.  He is in stable condition at this time.  Call the MRI and let them know the patient has his remote for MRI  of the brain.  Due to the recent ischemic event, patient will be admitted. Patient is taking his morning aspirin so is not give additional aspirin.       FINAL CLINICAL IMPRESSION(S) / ED DIAGNOSES   Final diagnoses:  Lacunar infarct, acute ([place])  Cerebrovascular accident (CVA), unspecified mechanism ([place])     Rx / DC Orders   ED Discharge Orders     None  Note:  This document was prepared using Dragon voice recognition software and may include unintentional dictation errors.    Versie Starks, PA-C 02/26/21 1557    Naaman Plummer, MD 02/27/21 [phone]

## 2021-02-26 NOTE — ED Notes (Signed)
Admitting MD at BS.  

## 2021-02-26 NOTE — ED Notes (Signed)
Back from CT. No changes. Pt alert, NAD, calm, interactive, eating.

## 2021-02-26 NOTE — H&P (Signed)
History and Physical    [name]:[id number] DOB: August 06, 1955 DOA: 02/26/2021  PCP: Leone Haven, MD (Confirm with patient/family/NH records and if not entered, this has to be entered at [facility] point of entry) Patient coming from: Home  I have personally briefly reviewed patient's old medical records in [place]  Chief Complaint: Right sided numbness   HPI: Walter Salas is a 66 y.o. male with medical history significant of CAD with stenting, HTN, HLD, IIDM, Thoracic aneurysm, chronic diastolic CHF, BPH, chronic sciatica s/p lumbar spinal stimulator, came with strokelike symptoms.  Symptoms started about 1 week ago, with new onset of right-sided numbness and tingling sensation.  Then he was referred to go see orthopedic surgery. Over the whole week, he has had persistent right-sided numbness and tingling.  This morning, he also developed right-sided face numbness and tingling sensation as well as loss of taste on the right half of the tongue.  He went to see orthopedic surgery who recommended he come in to rule out stroke.  He denies any chest pain no shortness of breath no palpitations.  No history of A-fib.  ED Course: Blood pressure within normal limits, no tachycardia no hypoxia.  CT had showed acute lacunar stroke in the left thalamus  Review of Systems: As per HPI otherwise 14 point review of systems negative.    Past Medical History:  Diagnosis Date   (HFpEF) heart failure with preserved ejection fraction ([place])    a. 2018 Echo: EF 60-65%, normal wall motion, Gr1DD, mild aortic valve calcification; b. 12/2017 Echo: EF 60-65%, no rwma, Ao root 4.3cm, Asc Ao 4.4 cm, mildly dil LA.   Agent orange exposure    Anginal pain ([place])    Arthritis    CAD (coronary artery disease)    a. s/p PCI/DES to the LCx and RCA in 2014; b. 05/2016 MV: EF 55-65%. Small, mild defect in basal anterolateral location->infarct w/o ischemia.   Cancer ([place])    skin   Chronic cough     Chronic pain    Colon polyp    Depression    Diabetes mellitus without complication (HCC)    Diverticulosis    GERD (gastroesophageal reflux disease)    History of kidney stones    History of stomach ulcers    History of subdural hematoma 12/14/2012   HOH (hard of hearing)    Hx of laminectomy 1999   Hyperlipidemia    Hypertension    Myocardial infarction [facility])    Sleep apnea    Syncope    a. Event monitor 2018: NSR with first-degree AV block, no significant arrhythmias or pauses; b.  Myoview showed a small in size, mild in severity fixed basal and anterior lateral defect, no ischemia, LVEF 55 to 65%, low risk study   Thoracic aortic aneurysm    a. TTE 2018: Mildly dil Ao root- 4.2 cm; b. CTA aorta 6/18: Asc Ao aneurysmal dil w max diam of 4.3 cm; b. 12/2019 CTA Chest: stable 4.3cm thor Ao Aneurysm.   Ulcer     Past Surgical History:  Procedure Laterality Date   [place] Bilateral 1990   CATARACT EXTRACTION W/PHACO Left 03/08/2018   Procedure: CATARACT EXTRACTION PHACO AND INTRAOCULAR LENS PLACEMENT ([place]) LEFT;  Surgeon: Birder Robson, MD;  Location: [facility] ORS;  Service: Ophthalmology;  Laterality: Left;  [place]  00:24.2 CDE 3.25 Fluid pack lot # [id number]   CATARACT EXTRACTION W/PHACO  Right 05/24/2018   Procedure: CATARACT EXTRACTION PHACO AND INTRAOCULAR LENS PLACEMENT ([facility]) RIGHT;  Surgeon: Birder Robson, MD;  Location: [place];  Service: Ophthalmology;  Laterality: Right;  Diabetic - oral meds   CERVICAL FUSION  2000. 2003   CHOLECYSTECTOMY  2000   COLONOSCOPY WITH PROPOFOL N/A 02/23/2017   Procedure: COLONOSCOPY WITH PROPOFOL;  Surgeon: Lollie Sails, MD;  Location[facility] ENDOSCOPY;  Service: Endoscopy;  Laterality: N/A;   CORONARY ANGIOPLASTY WITH STENT PLACEMENT  2014   [facility]   ESOPHAGOGASTRODUODENOSCOPY (EGD) WITH PROPOFOL N/A 02/23/2017   Procedure:  ESOPHAGOGASTRODUODENOSCOPY (EGD) WITH PROPOFOL;  Surgeon: Lollie Sails, MD;  Location: [facility] ENDOSCOPY;  Service: Endoscopy;  Laterality: N/A;   PULSE GENERATOR IMPLANT N/A 03/04/2020   Procedure: PULSE GENERATOR PLACEMENT;  Surgeon: Deetta Perla, MD;  Location: [facility] ORS;  Service: Neurosurgery;  Laterality: N/A;  1ST CASE   SHOULDER ARTHROSCOPY Right 2012   THORACIC LAMINECTOMY FOR SPINAL CORD STIMULATOR N/A 02/26/2020   Procedure: THORACIC LAMINECTOMY FOR SPINAL CORD STIMULATOR PADDLE TRIAL;  Surgeon: Deetta Perla, MD;  Location: [facility] ORS;  Service: Neurosurgery;  Laterality: N/A;  1ST CASE   TUMOR EXCISION  2007     reports that he has been smoking cigarettes. He has a 50.00 pack-year smoking history. He has never used smokeless tobacco. He reports that he does not drink alcohol and does not use drugs.  Allergies  Allergen Reactions   Gabapentin Other (See Comments)    "makes me crazy" MENTAL STATUS CHANGES    Isosorbide Mononitrate Er [Isosorbide Dinitrate] Other (See Comments)    Headache   Lisinopril Cough   Tylenol [Acetaminophen] Other (See Comments)    GI upset    Family History  Problem Relation Age of Onset   Diabetes Mother    Heart disease Father    Diabetes Father    Diabetes Sister    Heart disease Sister    Diabetes Brother    Heart disease Brother    Heart disease Paternal Grandfather    Cancer Neg Hx      Prior to Admission medications   Medication Sig Start Date End Date Taking? Authorizing Provider  amLODipine (NORVASC) 2.5 MG tablet TAKE ONE TABLET BY MOUTH EVERY DAY 11/04/20   End, Harrell Gave, MD  ASPIRIN 81 PO TAKE 81 MG BY MOUTH ONCE EVERY DAY 09/26/12   [provider]  atorvastatin (LIPITOR) 80 MG tablet Take 80 mg by mouth at bedtime.    [provider]  chlorthalidone (HYGROTON) 50 MG tablet Take 25 mg by mouth daily.    [provider]  cholecalciferol (VITAMIN D) 1000 UNITS tablet Take 1 tablet (1,000 Units total)  by mouth QID. 11/15/14   Jackolyn Confer, MD  CHOLECALCIFEROL PO Take 4,000 Int'l Units/1.61m2 by mouth daily.    [provider]  DULoxetine (CYMBALTA) 20 MG capsule Take 20 mg by mouth daily.    [provider]  fenofibrate (TRICOR) 145 MG tablet Take 1 tablet (145 mg total) by mouth daily. 11/15/14   Jackolyn Confer, MD  glucose blood test strip Check once daily, E11.9 02/22/18   Leone Haven, MD  lidocaine (LIDODERM) 5 % 1 patch daily as needed-Remove & Discard patch within 12 hours or as directed by MD    [provider]  losartan (COZAAR) 25 MG tablet Take 25 mg by mouth daily.    [provider]  metFORMIN (GLUCOPHAGE) 1000 MG tablet Take  1,000 mg by mouth 2 (two) times daily with a meal.    [provider]  methyl salicylate liquid Apply 1 application topically as needed for muscle pain.    [provider]  nitroGLYCERIN (NITROSTAT) 0.4 MG SL tablet Place 1 tablet (0.4 mg total) under the tongue every 5 (five) minutes as needed for chest pain. May take up to 3 doses. 01/15/20   Leone Haven, MD  omeprazole (PRILOSEC) 20 MG capsule Take 20 mg by mouth daily.    [provider]  polyvinyl alcohol (LIQUIFILM TEARS) 1.4 % ophthalmic solution Place 1 drop into both eyes as needed for dry eyes.     [provider]  potassium chloride SA (K-DUR,KLOR-CON) 20 MEQ tablet Take 1 tablet (20 mEq total) by mouth daily. 06/25/16   End, Harrell Gave, MD  pregabalin (LYRICA) 50 MG capsule Take 1 capsule (50 mg total) by mouth 3 (three) times daily. 12/04/18   McLean-Scocuzza, Nino Glow, MD  Semaglutide, 1 MG/DOSE, (OZEMPIC, 1 MG/DOSE,) 4 MG/3ML SOPN INJECT 0.75 ML (1 MG) INTO THE SKIN ONCEA WEEK 05/07/20   Leone Haven, MD  tamsulosin (FLOMAX) 0.4 MG CAPS capsule TAKE 1 CAPSULE BY MOUTH ONCE DAILY 11/04/20   Leone Haven, MD  traMADol (ULTRAM) 50 MG tablet TAKE TWO TABLETS BY MOUTH EVERY 8 HOURS AS NEEDED FOR MODERATE  PAIN 02/07/21   Leone Haven, MD  VASCEPA 1 g capsule TAKE 2 CAPSULES BY MOUTH TWICE DAILY 09/25/20   Leone Haven, MD  vitamin B-12 (CYANOCOBALAMIN) 1000 MCG tablet Take 1 tablet (1,000 mcg total) by mouth daily. 11/15/14   Jackolyn Confer, MD    Physical Exam: Vitals:   02/26/21 1245 02/26/21 1300 02/26/21 1315 02/26/21 1330  BP: (!) 141/78 (!) 142/75 135/72 137/78  Pulse: 77 79 77 77  Resp: 18 14 20 11   Temp:      TempSrc:      SpO2: 95% 94% 93% 90%  Weight:      Height:        Constitutional: NAD, calm, comfortable Vitals:   02/26/21 1245 02/26/21 1300 02/26/21 1315 02/26/21 1330  BP: (!) 141/78 (!) 142/75 135/72 137/78  Pulse: 77 79 77 77  Resp: 18 14 20 11   Temp:      TempSrc:      SpO2: 95% 94% 93% 90%  Weight:      Height:       Eyes: PERRL, lids and conjunctivae normal ENMT: Mucous membranes are moist. Posterior pharynx clear of any exudate or lesions.Normal dentition.  Neck: normal, supple, no masses, no thyromegaly Respiratory: clear to auscultation bilaterally, no wheezing, no crackles. Normal respiratory effort. No accessory muscle use.  Cardiovascular: Regular rate and rhythm, no murmurs / rubs / gallops. No extremity edema. 2+ pedal pulses. No carotid bruits.  Abdomen: no tenderness, no masses palpated. No hepatosplenomegaly. Bowel sounds positive.  Musculoskeletal: no clubbing / cyanosis. No joint deformity upper and lower extremities. Good ROM, no contractures. Normal muscle tone.  Skin: no rashes, lesions, ulcers. No induration Neurologic: CN 2-12 grossly intact.  Decreased light touch sensation on right face, right arm and fingers in the right leg and foot compared to the left side, DTR normal. Strength 5/5 in all 4.  Psychiatric: Normal judgment and insight. Alert and oriented x 3. Normal mood.     Labs on Admission: I have personally reviewed following labs and imaging studies  CBC: Recent Labs  Lab 02/26/21 1051  WBC 6.8  NEUTROABS  4.8  HGB 15.8  HCT 47.2  MCV 82.4  PLT 811   Basic Metabolic Panel: Recent Labs  Lab 02/26/21 1051  NA 132*  K 3.7  CL 95*  CO2 27  GLUCOSE 238*  BUN 23  CREATININE 0.86  CALCIUM 9.4   GFR: Estimated Creatinine Clearance: 100.4 mL/min (by C-G formula based on SCr of 0.86 mg/dL). Liver Function Tests: Recent Labs  Lab 02/26/21 1051  AST 21  ALT 21  ALKPHOS 71  BILITOT 1.0  PROT 8.0  ALBUMIN 4.8   No results for input(s): LIPASE, AMYLASE in the last 168 hours. No results for input(s): AMMONIA in the last 168 hours. Coagulation Profile: Recent Labs  Lab 02/26/21 1051  INR 1.0   Cardiac Enzymes: No results for input(s): CKTOTAL, CKMB, CKMBINDEX, TROPONINI in the last 168 hours. BNP (last 3 results) No results for input(s): PROBNP in the last 8760 hours. HbA1C: No results for input(s): HGBA1C in the last 72 hours. CBG: No results for input(s): GLUCAP in the last 168 hours. Lipid Profile: No results for input(s): CHOL, HDL, LDLCALC, TRIG, CHOLHDL, LDLDIRECT in the last 72 hours. Thyroid Function Tests: No results for input(s): TSH, T4TOTAL, FREET4, T3FREE, THYROIDAB in the last 72 hours. Anemia Panel: No results for input(s): VITAMINB12, FOLATE, FERRITIN, TIBC, IRON, RETICCTPCT in the last 72 hours. Urine analysis:    Component Value Date/Time   COLORURINE YELLOW (A) 02/19/2020 1005   APPEARANCEUR CLEAR (A) 02/19/2020 1005   LABSPEC 1.019 02/19/2020 1005   PHURINE 5.0 02/19/2020 1005   GLUCOSEU 50 (A) 02/19/2020 1005   HGBUR NEGATIVE 02/19/2020 1005   BILIRUBINUR NEGATIVE 02/19/2020 1005   BILIRUBINUR negative 10/10/2019 1530   KETONESUR NEGATIVE 02/19/2020 1005   PROTEINUR NEGATIVE 02/19/2020 1005   UROBILINOGEN 0.2 10/10/2019 1530   NITRITE NEGATIVE 02/19/2020 1005   LEUKOCYTESUR NEGATIVE 02/19/2020 1005    Radiological Exams on Admission: CT HEAD WO CONTRAST  Result Date: 02/26/2021 CLINICAL DATA:  66 year old male with right side numbness  beginning last week but progressive. EXAM: CT HEAD WITHOUT CONTRAST TECHNIQUE: Contiguous axial images were obtained from the base of the skull through the vertex without intravenous contrast. RADIATION DOSE REDUCTION: This exam was performed according to the departmental dose-optimization program which includes automated exposure control, adjustment of the mA and/or kV according to patient size and/or use of iterative reconstruction technique. COMPARISON:  Head CT 07/31/2006. FINDINGS: Brain: Cerebral volume is within normal limits for age. Hypodense lacunar infarct lateral left thalamus near the posterior limb left internal capsule is new since 2008 and age indeterminate. Elsewhere gray-white matter differentiation is within normal limits. No superimposed midline shift, ventriculomegaly, mass effect, evidence of mass lesion, intracranial hemorrhage or evidence of cortically based acute infarction. Vascular: Extensive Calcified atherosclerosis at the skull base. No suspicious intracranial vascular hyperdensity. Skull: No acute osseous abnormality identified. Sinuses/Orbits: Occasional mild paranasal sinus mucosal thickening and/or retention cysts. Sinus aeration appears improved compared to 2008. Tympanic cavities and mastoids are clear. Other: Postoperative changes to both globes. No acute orbit or scalp soft tissue finding. IMPRESSION: 1. Lacunar infarct in the Left Thalamus, age indeterminate by CT but suspicious for recent small vessel ischemia in this setting. 2. Otherwise negative for age noncontrast CT appearance of the brain. Electronically Signed   By: Genevie Ann M.D.   On: 02/26/2021 11:27    EKG: Independently reviewed.  Sinus, no acute ST changes, chronic low voltage QRS.  Assessment/Plan Principal Problem:   Stroke (cerebrum) (HCC)  (  please populate well all problems here in Problem List. (For example, if patient is on BP meds at home and you resume or decide to hold them, it is a problem that  needs to be her. Same for CAD, COPD, HLD and so on)  Right-sided paresthesia acute/subacute -Secondary to acute left thalamus lacunar stroke -Escalate antiplatelet regimen to aspirin plus Plavix x3 weeks then aspirin alone. -Given his symptoms started more than 24 hours ago, out of permissive hypertension window.  We will resume oral home BP meds. -Risk factor modification, check A1c and lipid panel.  HTN -Resume chlorthalidone, losartan, and amlodipine.  Mild hyponatremia -Euvolemic, likely secondary to chlorthalidone.  Given rather asymptomatic, recommend outpatient follow-up with PCP.  IIDM -Patient is to get CTA, will hold off metformin.  Sliding scale for now.  Anxiety/depression -Continue SSRI  Chronic leg pain -On pregabalin  HLD - Continue Statin  DVT prophylaxis: Lovenox Code Status: Full code Family Communication: Wife at bedside Disposition Plan: Stroke symptoms mild, expect less than 2 midnight hospital stay. Consults called: Neurology Admission status: Telemetry observation   Lequita Halt MD [facility] Pager [phone]  02/26/2021, 1:54 PM

## 2021-02-26 NOTE — ED Notes (Signed)
MRI screening in progress. Pending arrival of pt's NCS remote.

## 2021-02-26 NOTE — ED Notes (Signed)
Back from MRI, alert, NAD, calm, interactive. No changes. Continues to report R sided painful numbness.

## 2021-02-26 NOTE — ED Triage Notes (Signed)
Pt comes into the ED via POV c/o right side numbness from the face all the way down to his leg.  PT states the numbness started last week with his arm and by Monday this week it was in his face and the rest of the way down.  Pt ambulatory to triage at this time and is in NAD.  Pt is unsure if there is any blood thinner medications he takes.  Pt denies any change in vision.

## 2021-02-26 NOTE — Progress Notes (Signed)
SLP Cancellation Note  Patient Details Name: Walter Salas MRN: [id number] DOB: [birth year]   Cancelled treatment:       Reason Eval/Treat Not Completed: SLP screened, no needs identified, will sign off (chart reviewed; met w/ pt in room during dinner meal) Pt denied any difficulty swallowing and is currently on a regular diet; tolerates swallowing pills w/ water per NSG. Pt conversed in conversation w/out expressive/receptive deficits noted; pt denied any speech-language deficits. Speech clear. Pt stated "I don't have any problems talking or eating". Requested assistance sitting the bed up to eat his meal, and pull the tray table to his bedside. No further skilled ST services indicated as pt appears at his baseline. Pt agreed. NSG to reconsult if any change in status while admitted.        Orinda Kenner, MS, CCC-SLP Speech Language Pathologist Rehab Services; [place] [phone] (ascom) Kasra Melvin 02/26/2021, 5:38 PM

## 2021-02-26 NOTE — ED Notes (Signed)
No changes, pt to CT.

## 2021-02-27 ENCOUNTER — Observation Stay
Admit: 2021-02-27 | Discharge: 2021-02-27 | Disposition: A | Discharge status: Home / Self Care | Payer: Medicare Other | Attending: Internal Medicine | Admitting: Internal Medicine

## 2021-02-27 DIAGNOSIS — F17200 Nicotine dependence, unspecified, uncomplicated: Secondary | ICD-10-CM | POA: Diagnosis not present

## 2021-02-27 DIAGNOSIS — G4733 Obstructive sleep apnea (adult) (pediatric): Secondary | ICD-10-CM | POA: Diagnosis not present

## 2021-02-27 DIAGNOSIS — I6359 Cerebral infarction due to unspecified occlusion or stenosis of other cerebral artery: Secondary | ICD-10-CM | POA: Diagnosis not present

## 2021-02-27 DIAGNOSIS — I719 Aortic aneurysm of unspecified site, without rupture: Secondary | ICD-10-CM

## 2021-02-27 DIAGNOSIS — I639 Cerebral infarction, unspecified: Secondary | ICD-10-CM | POA: Diagnosis present

## 2021-02-27 DIAGNOSIS — I63432 Cerebral infarction due to embolism of left posterior cerebral artery: Secondary | ICD-10-CM

## 2021-02-27 DIAGNOSIS — I10 Essential (primary) hypertension: Secondary | ICD-10-CM

## 2021-02-27 DIAGNOSIS — Z8673 Personal history of transient ischemic attack (TIA), and cerebral infarction without residual deficits: Secondary | ICD-10-CM | POA: Diagnosis present

## 2021-02-27 DIAGNOSIS — E782 Mixed hyperlipidemia: Secondary | ICD-10-CM

## 2021-02-27 DIAGNOSIS — G8921 Chronic pain due to trauma: Secondary | ICD-10-CM

## 2021-02-27 DIAGNOSIS — I6381 Other cerebral infarction due to occlusion or stenosis of small artery: Secondary | ICD-10-CM | POA: Diagnosis not present

## 2021-02-27 LAB — ECHOCARDIOGRAM COMPLETE
Height: 70 in
S' Lateral: 2.9 cm
Weight: 3648 oz

## 2021-02-27 LAB — LIPID PANEL
Cholesterol: 191 mg/dL (ref 0–200)
HDL: 27 mg/dL — ABNORMAL LOW (ref >40)
LDL Cholesterol: UNDETERMINED mg/dL (ref 0–99)
Total CHOL/HDL Ratio: 7.1 RATIO
Triglycerides: 513 mg/dL — ABNORMAL HIGH (ref <150)
VLDL: UNDETERMINED mg/dL (ref 0–40)

## 2021-02-27 LAB — HEMOGLOBIN A1C
Hgb A1c MFr Bld: 6.6 % — ABNORMAL HIGH (ref 4.8–5.6)
Mean Plasma Glucose: 142.72 mg/dL

## 2021-02-27 LAB — GLUCOSE, CAPILLARY: Glucose-Capillary: 169 mg/dL — ABNORMAL HIGH (ref 70–99)

## 2021-02-27 LAB — LDL CHOLESTEROL, DIRECT: Direct LDL: 80 mg/dL (ref 0–99)

## 2021-02-27 LAB — HIV ANTIBODY (ROUTINE TESTING W REFLEX): HIV Screen 4th Generation wRfx: NONREACTIVE

## 2021-02-27 MED ORDER — CLOPIDOGREL BISULFATE 75 MG PO TABS: 75.0000 mg | ORAL_TABLET | Freq: Every day | ORAL | 0 refills | Status: DC

## 2021-02-27 MED ORDER — DULOXETINE HCL 40 MG PO CPEP: 20.0000 mg | ORAL_CAPSULE | Freq: Every day | ORAL | 0 refills | Status: DC

## 2021-02-27 NOTE — Progress Notes (Signed)
Pt was admitted in the floor with no signs of distress. Pt alert x 4. VSS. Pt was educated about safety and ascom within pt reach. Will continue to monitor.

## 2021-02-27 NOTE — Progress Notes (Signed)
Pt discharged via wheelchair by a volunteer to the [facility] entrance 

## 2021-02-27 NOTE — Discharge Summary (Signed)
Physician Discharge Summary  KASHMERE STAFFA TWS:[id number] DOB: [birth year] DOA: 02/26/2021  PCP: Leone Haven, MD  Admit date: 02/26/2021 Discharge date: 02/27/2021  Admitted From: Home Disposition: Home  Recommendations for Outpatient Follow-up:  Follow up with PCP within 1-2 weeks Consider holter monitor vs cardiology referral for further stroke etiology evaluation CPAP evaluation Tobacco cessation discussion Follow up with neurology in 1-2 weeks  No home health recommended Equipment/Devices:none  Discharge Condition:stable, improved CODE STATUS:  Code Status: Full Code  Regular healthy diet  Brief/Interim Summary: Walter Salas is a 66 y.o. male with medical history significant of CAD with stenting, HTN, HLD, IIDM, Thoracic aneurysm, chronic diastolic CHF, BPH, chronic sciatica s/p lumbar spinal stimulator, came with strokelike symptoms.   Symptoms started about 1 week ago, with new onset of right-sided numbness and tingling sensation.  Then he was referred to go see orthopedic surgery. Over the whole week, he has had persistent right-sided numbness and tingling.  This morning, he also developed right-sided face numbness and tingling sensation as well as loss of taste on the right half of the tongue.  He went to see orthopedic surgery who recommended he come in to rule out stroke.  He denies any chest pain no shortness of breath no palpitations.  No history of A-fib.  He was evaluated by neurology and received a stroke workup that revealed a new embolic lacunar stroke to left thalamus. His symptoms resolved spontaneously. He was treated with initiation of preventative medications including aspirin, plavix, statin continuation and received smoking cessation counseling.   Discharge Diagnoses:  Principal Problem:   Stroke (cerebrum) [facility]) Active Problems:   CVA (cerebral vascular accident) ([place])   Primary hypertension    Allergies as of 02/27/2021       Reactions    Gabapentin Other (See Comments)   "makes me crazy" MENTAL STATUS CHANGES   Isosorbide Mononitrate Er [isosorbide Dinitrate] Other (See Comments)   Headache   Lisinopril Cough   Tylenol [acetaminophen] Other (See Comments)   GI upset        Medication List     STOP taking these medications    cholecalciferol 1000 units tablet Commonly known as: VITAMIN D   CHOLECALCIFEROL PO       TAKE these medications    amLODipine 2.5 MG tablet Commonly known as: NORVASC TAKE ONE TABLET BY MOUTH EVERY DAY   aspirin 81 MG EC tablet Take 81 mg by mouth daily at 12 noon.   atorvastatin 80 MG tablet Commonly known as: LIPITOR Take 80 mg by mouth daily.   chlorthalidone 50 MG tablet Commonly known as: HYGROTON Take 25 mg by mouth daily.   clopidogrel 75 MG tablet Commonly known as: PLAVIX Take 1 tablet (75 mg total) by mouth daily for 21 days. Start taking on: February 28, 2021   DULoxetine 20 MG capsule Commonly known as: CYMBALTA Take 20 mg by mouth daily.   fenofibrate 145 MG tablet Commonly known as: TRICOR Take 1 tablet (145 mg total) by mouth daily.   glucose blood test strip Check once daily, E11.9   lidocaine 5 % Commonly known as: LIDODERM 1 patch daily as needed-Remove & Discard patch within 12 hours or as directed by MD   losartan 25 MG tablet Commonly known as: COZAAR Take 25 mg by mouth daily.   metFORMIN 1000 MG tablet Commonly known as: GLUCOPHAGE Take 1,000 mg by mouth 2 (two) times daily with a meal.   methyl salicylate liquid Apply 1 application  topically as needed for muscle pain.   nitroGLYCERIN 0.4 MG SL tablet Commonly known as: NITROSTAT Place 1 tablet (0.4 mg total) under the tongue every 5 (five) minutes as needed for chest pain. May take up to 3 doses.   omeprazole 20 MG capsule Commonly known as: PRILOSEC Take 20 mg by mouth daily.   Ozempic (1 MG/DOSE) 4 MG/3ML Sopn Generic drug: Semaglutide (1 MG/DOSE) INJECT 0.75 ML (1 MG)  INTO THE SKIN ONCEA WEEK   polyvinyl alcohol 1.4 % ophthalmic solution Commonly known as: LIQUIFILM TEARS Place 1 drop into both eyes as needed for dry eyes.   potassium chloride SA 20 MEQ tablet Commonly known as: KLOR-CON M Take 1 tablet (20 mEq total) by mouth daily.   pregabalin 50 MG capsule Commonly known as: LYRICA Take 1 capsule (50 mg total) by mouth 3 (three) times daily.   tamsulosin 0.4 MG Caps capsule Commonly known as: FLOMAX TAKE 1 CAPSULE BY MOUTH ONCE DAILY   traMADol 50 MG tablet Commonly known as: ULTRAM TAKE TWO TABLETS BY MOUTH EVERY 8 HOURS AS NEEDED FOR MODERATE PAIN   Vascepa 1 g capsule Generic drug: icosapent Ethyl TAKE 2 CAPSULES BY MOUTH TWICE DAILY   vitamin B-12 1000 MCG tablet Commonly known as: CYANOCOBALAMIN Take 1 tablet (1,000 mcg total) by mouth daily.        Allergies  Allergen Reactions   Gabapentin Other (See Comments)    "makes me crazy" MENTAL STATUS CHANGES    Isosorbide Mononitrate Er [Isosorbide Dinitrate] Other (See Comments)    Headache   Lisinopril Cough   Tylenol [Acetaminophen] Other (See Comments)    GI upset    Consultations: Neurology   Procedures/Studies: CT ANGIO HEAD NECK W WO CM  Result Date: 02/26/2021 CLINICAL DATA:  Stroke, follow up. Neuro deficit, acute, stroke suspected. Left thalamic lacunar infraction. EXAM: CT ANGIOGRAPHY HEAD AND NECK TECHNIQUE: Multidetector CT imaging of the head and neck was performed using the standard protocol during bolus administration of intravenous contrast. Multiplanar CT image reconstructions and MIPs were obtained to evaluate the vascular anatomy. Carotid stenosis measurements (when applicable) are obtained utilizing NASCET criteria, using the distal internal carotid diameter as the denominator. RADIATION DOSE REDUCTION: This exam was performed according to the departmental dose-optimization program which includes automated exposure control, adjustment of the mA and/or  kV according to patient size and/or use of iterative reconstruction technique. CONTRAST:  56mL OMNIPAQUE IOHEXOL 350 MG/ML SOLN COMPARISON:  Soft tissue neck CT 03/03/2017 FINDINGS: CTA NECK FINDINGS Aortic arch: Standard 3 vessel aortic arch with mild atherosclerotic plaque. No arch vessel origin stenosis. Right carotid system: Patent with a small amount of calcified plaque at the carotid bifurcation and moderate amount of predominantly calcified plaque in the mid and distal cervical ICA. No evidence of a significant stenosis or dissection. Left carotid system: Patent with a small amount of calcified and soft plaque in the distal common carotid artery and at the carotid bifurcation and a small to moderate amount of calcified plaque in the distal cervical ICA. No evidence of a significant stenosis or dissection. Vertebral arteries: Patent without evidence a significant stenosis or dissection. Dominant right vertebral artery which is partially duplicated proximally. The duplicated segments both arise from the right subclavian artery and join at the C4-5 level. Skeleton: C5-6 fusion.  Right C7 laminectomy. Other neck: Subcentimeter left thyroid nodule for which no follow-up imaging is recommended. Upper chest: Small calcified nodule in the right upper lobe. Review of the MIP images  confirms the above findings CTA HEAD FINDINGS Anterior circulation: The internal carotid arteries are patent from skull base to carotid termini with a mild to moderate amount of calcified plaque bilaterally not resulting in significant stenosis. ACAs and MCAs are patent without evidence of a proximal branch occlusion or significant proximal stenosis. No aneurysm is identified. Posterior circulation: Intracranial vertebral arteries are widely patent to the basilar. Patent PICA and SCA origins are seen bilaterally. The basilar artery is widely patent. There is a large right posterior communicating artery with absent right P1 segment. Both  PCAs are patent without evidence of a significant proximal stenosis. No aneurysm is identified. Venous sinuses: Patent. Anatomic variants: Absent right A1 segment.  Fetal right PCA. Review of the MIP images confirms the above findings IMPRESSION: 1. Mild-to-moderate atherosclerosis in the head and neck without large vessel occlusion, significant proximal stenosis, or aneurysm. 2. Aortic Atherosclerosis (ICD10-I70.0). Electronically Signed   By: Logan Bores M.D.   On: 02/26/2021 14:47   DG Shoulder Right  Result Date: 02/17/2021 CLINICAL DATA:  Right shoulder pain. EXAM: RIGHT SHOULDER - 2+ VIEW COMPARISON:  None. FINDINGS: There is no evidence of fracture or dislocation. Mild-to-moderate acromioclavicular degenerative changes are seen. Mild soft tissue calcification is also seen in the region of the distal rotator cuff tendon. IMPRESSION: No acute findings. Acromioclavicular degenerative changes. Distal rotator cuff tendon calcification, consistent with calcific tendinitis. Electronically Signed   By: Marlaine Hind M.D.   On: 02/17/2021 15:22   CT HEAD WO CONTRAST  Result Date: 02/26/2021 CLINICAL DATA:  66 year old male with right side numbness beginning last week but progressive. EXAM: CT HEAD WITHOUT CONTRAST TECHNIQUE: Contiguous axial images were obtained from the base of the skull through the vertex without intravenous contrast. RADIATION DOSE REDUCTION: This exam was performed according to the departmental dose-optimization program which includes automated exposure control, adjustment of the mA and/or kV according to patient size and/or use of iterative reconstruction technique. COMPARISON:  Head CT 07/31/2006. FINDINGS: Brain: Cerebral volume is within normal limits for age. Hypodense lacunar infarct lateral left thalamus near the posterior limb left internal capsule is new since 2008 and age indeterminate. Elsewhere gray-white matter differentiation is within normal limits. No superimposed midline  shift, ventriculomegaly, mass effect, evidence of mass lesion, intracranial hemorrhage or evidence of cortically based acute infarction. Vascular: Extensive Calcified atherosclerosis at the skull base. No suspicious intracranial vascular hyperdensity. Skull: No acute osseous abnormality identified. Sinuses/Orbits: Occasional mild paranasal sinus mucosal thickening and/or retention cysts. Sinus aeration appears improved compared to 2008. Tympanic cavities and mastoids are clear. Other: Postoperative changes to both globes. No acute orbit or scalp soft tissue finding. IMPRESSION: 1. Lacunar infarct in the Left Thalamus, age indeterminate by CT but suspicious for recent small vessel ischemia in this setting. 2. Otherwise negative for age noncontrast CT appearance of the brain. Electronically Signed   By: Genevie Ann M.D.   On: 02/26/2021 11:27   MR BRAIN WO CONTRAST  Result Date: 02/26/2021 CLINICAL DATA:  Neuro deficit, acute, stroke suspected EXAM: MRI HEAD WITHOUT CONTRAST TECHNIQUE: Multiplanar, multiecho pulse sequences of the brain and surrounding structures were obtained without intravenous contrast. COMPARISON:  Same day CT/CTA. FINDINGS: Brain: Acute left thalamocapsular infarct. Mild associated edema without mass effect. No evidence of acute hemorrhage. Small focus of susceptibility artifact in the right frontal periventricular lobe, probably chronic microhemorrhage. No mass lesion, midline shift, hydrocephalus, or extra-axial fluid collection. There are a few additional scattered punctate T2/FLAIR hyperintensities the white matter, nonspecific but  compatible with chronic microvascular ischemic disease that is very mild for patient age. Vascular: Major arterial flow voids are maintained skull base. Better evaluated on same day CTA. Skull and upper cervical spine: Normal marrow signal. Sinuses/Orbits: Mild paranasal sinus mucosal thickening with small retention cyst in the right frontal sinus. Unremarkable  orbits. Other: No mastoid effusions. IMPRESSION: Acute left thalamocapsular infarct.  Mild edema without mass effect. Electronically Signed   By: Margaretha Sheffield M.D.   On: 02/26/2021 16:31   US Venous Img Upper Uni Right(DVT)  Result Date: 02/17/2021 CLINICAL DATA:  Right shoulder pain. Evaluate for right upper extremity deep venous thrombosis. EXAM: RIGHT UPPER EXTREMITY VENOUS DOPPLER ULTRASOUND TECHNIQUE: Gray-scale sonography with graded compression, as well as color Doppler and duplex ultrasound were performed to evaluate the upper extremity deep venous system from the level of the subclavian vein and including the jugular, axillary, basilic, radial, ulnar and upper cephalic vein. Spectral Doppler was utilized to evaluate flow at rest and with distal augmentation maneuvers. COMPARISON:  Right shoulder radiographs 02/17/2021 FINDINGS: Contralateral Subclavian Vein: Respiratory phasicity is normal and symmetric with the symptomatic side. No evidence of thrombus. Normal compressibility. Internal Jugular Vein: No evidence of thrombus. Normal compressibility, respiratory phasicity and response to augmentation. Subclavian Vein: No evidence of thrombus. Normal compressibility, respiratory phasicity and response to augmentation. Axillary Vein: No evidence of thrombus. Normal compressibility, respiratory phasicity and response to augmentation. Cephalic Vein: No evidence of thrombus. Normal compressibility, respiratory phasicity and response to augmentation. Basilic Vein: No evidence of thrombus. Normal compressibility, respiratory phasicity and response to augmentation. Brachial Veins: No evidence of thrombus. Normal compressibility, respiratory phasicity and response to augmentation. Radial Veins: No evidence of thrombus. Normal compressibility, respiratory phasicity and response to augmentation. Ulnar Veins: No evidence of thrombus. Normal compressibility, respiratory phasicity and response to augmentation.  Venous Reflux:  None visualized. Other Findings:  None visualized. IMPRESSION: No evidence of DVT within the right upper extremity. Electronically Signed   By: Yvonne Kendall M.D.   On: 02/17/2021 17:11    Subjective: Patient feels well. Expresses resolution of symptoms.   Discharge Exam: Vitals:   02/27/21 0520 02/27/21 0819  BP: 134/79 129/77  Pulse: 72 73  Resp: 18 18  Temp: 98.2 F (36.8 C) 98.1 F (36.7 C)  SpO2: 95% 96%    General: Pt is alert, awake, not in acute distress Cardiovascular: RRR, S1/S2 +, no rubs, no gallops Respiratory: CTA bilaterally, no wheezing, no rhonchi Abdominal: Soft, NT, ND, bowel sounds + Extremities: no edema, no cyanosis  Labs: Basic Metabolic Panel: Recent Labs  Lab 02/26/21 1051  NA 132*  K 3.7  CL 95*  CO2 27  GLUCOSE 238*  BUN 23  CREATININE 0.86  CALCIUM 9.4   CBC: Recent Labs  Lab 02/26/21 1051  WBC 6.8  NEUTROABS 4.8  HGB 15.8  HCT 47.2  MCV 82.4  PLT 252    Microbiology Recent Results (from the past 240 hour(s))  Resp Panel by RT-PCR (Flu A&B, Covid) Nasopharyngeal Swab     Status: None   Collection Time: 02/26/21 12:35 PM   Specimen: Nasopharyngeal Swab; Nasopharyngeal(NP) swabs in vial transport medium  Result Value Ref Range Status   SARS Coronavirus 2 by RT PCR NEGATIVE NEGATIVE Final    Comment: (NOTE) SARS-CoV-2 target nucleic acids are NOT DETECTED.  The SARS-CoV-2 RNA is generally detectable in upper respiratory specimens during the acute phase of infection. The lowest concentration of SARS-CoV-2 viral copies this assay can detect is 138  copies/mL. A negative result does not preclude SARS-Cov-2 infection and should not be used as the sole basis for treatment or other patient management decisions. A negative result may occur with  improper specimen collection/handling, submission of specimen other than nasopharyngeal swab, presence of viral mutation(s) within the areas targeted by this assay, and  inadequate number of viral copies(<138 copies/mL). A negative result must be combined with clinical observations, patient history, and epidemiological information. The expected result is Negative.  Fact Sheet for Patients:  EntrepreneurPulse.com.au  Fact Sheet for Healthcare Providers:  IncredibleEmployment.be  This test is no t yet approved or cleared by the [place] FDA and  has been authorized for detection and/or diagnosis of SARS-CoV-2 by FDA under an Emergency Use Authorization (EUA). This EUA will remain  in effect (meaning this test can be used) for the duration of the COVID-19 declaration under Section 564(b)(1) of the Act, 21 U.S.C.section 360bbb-3(b)(1), unless the authorization is terminated  or revoked sooner.       Influenza A by PCR NEGATIVE NEGATIVE Final   Influenza B by PCR NEGATIVE NEGATIVE Final    Comment: (NOTE) The Xpert Xpress SARS-CoV-2/FLU/RSV plus assay is intended as an aid in the diagnosis of influenza from Nasopharyngeal swab specimens and should not be used as a sole basis for treatment. Nasal washings and aspirates are unacceptable for Xpert Xpress SARS-CoV-2/FLU/RSV testing.  Fact Sheet for Patients: EntrepreneurPulse.com.au  Fact Sheet for Healthcare Providers: IncredibleEmployment.be  This test is not yet approved or cleared by the [place] FDA and has been authorized for detection and/or diagnosis of SARS-CoV-2 by FDA under an Emergency Use Authorization (EUA). This EUA will remain in effect (meaning this test can be used) for the duration of the COVID-19 declaration under Section 564(b)(1) of the Act, 21 U.S.C. section 360bbb-3(b)(1), unless the authorization is terminated or revoked.  Performed at [facility], [address], [place], [place] [address]     Time coordinating discharge: Over 30 minutes  Richarda Osmond, MD  [facility] 02/27/2021, 11:18 AM

## 2021-02-27 NOTE — Plan of Care (Signed)
°  Problem: Health Behavior/Discharge Planning: Goal: Ability to manage health-related needs will improve 02/27/2021 0142 by Liliane Channel, RN Outcome: Progressing 02/27/2021 0131 by Liliane Channel, RN Outcome: Progressing   Problem: Clinical Measurements: Goal: Respiratory complications will improve 02/27/2021 0142 by Liliane Channel, RN Outcome: Progressing 02/27/2021 0131 by Liliane Channel, RN Outcome: Progressing   Problem: Clinical Measurements: Goal: Cardiovascular complication will be avoided 02/27/2021 0142 by Liliane Channel, RN Outcome: Progressing 02/27/2021 0131 by Liliane Channel, RN Outcome: Progressing   Problem: Safety: Goal: Ability to remain free from injury will improve 02/27/2021 0142 by Liliane Channel, RN Outcome: Progressing 02/27/2021 0131 by Liliane Channel, RN Outcome: Progressing   Problem: Pain Managment: Goal: General experience of comfort will improve 02/27/2021 0142 by Liliane Channel, RN Outcome: Progressing 02/27/2021 0131 by Liliane Channel, RN Outcome: Progressing   Problem: Safety: Goal: Ability to remain free from injury will improve 02/27/2021 0142 by Liliane Channel, RN Outcome: Progressing 02/27/2021 0131 by Liliane Channel, RN Outcome: Progressing   Problem: Education: Goal: Knowledge of secondary prevention will improve (SELECT ALL) Outcome: Progressing

## 2021-02-27 NOTE — Progress Notes (Signed)
*  PRELIMINARY RESULTS* Echocardiogram 2D Echocardiogram has been performed.  Sherrie Sport 02/27/2021, 8:24 AM

## 2021-02-27 NOTE — Progress Notes (Incomplete)
PROGRESS NOTE  Walter Salas    DOB: 1955/08/01, 66 y.o.  XVQ:[id number]    Code Status: Full Code   DOA: 02/26/2021   LOS: 0   Brief hospital course  Walter Salas is a 66 y.o. male with a PMH significant for ***. They presented from *** to the ED on 02/26/2021 with *** x *** days. *** In the ED, it was found that they had ***.  They were treated with ***.  Patient was admitted to medicine service for further workup and management of *** as outlined in detail below.  02/27/21 -***  Assessment & Plan  Principal Problem:   Stroke (cerebrum) (HCC)  *** -   *** -   *** -   *** -   *** -  Body mass index is 32.71 kg/m.  VTE ppx:    Diet:     Diet   Diet heart healthy/carb modified Room service appropriate? Yes; Fluid consistency: Thin   Subjective 02/27/21    Pt reports ***   Objective   Vitals:   02/26/21 2030 02/26/21 2135 02/27/21 0023 02/27/21 0520  BP: 140/74 136/75 136/79 134/79  Pulse: 74  77 72  Resp: 19 18 17 18   Temp: 98.2 F (36.8 C) 98.2 F (36.8 C) 98.1 F (36.7 C) 98.2 F (36.8 C)  TempSrc:      SpO2: 95% 97% 96% 95%  Weight:      Height:        Intake/Output Summary (Last 24 hours) at 02/27/2021 0721 Last data filed at 02/27/2021 0716 Gross per 24 hour  Intake 360 ml  Output --  Net 360 ml   Filed Weights   02/26/21 1049  Weight: 103.4 kg     Physical Exam: *** General: awake, alert, NAD HEENT: atraumatic, clear conjunctiva, anicteric sclera, MMM, hearing grossly normal Respiratory: normal respiratory effort. Cardiovascular: normal S1/S2, RRR, no JVD, murmurs, quick capillary refill  Gastrointestinal: soft, NT, ND Nervous: A&O x3. no gross focal neurologic deficits, normal speech Extremities: moves all equally, no edema, normal tone Skin: dry, intact, normal temperature, normal color. No rashes, lesions or ulcers on exposed skin Psychiatry: normal mood, congruent affect  Labs   I have personally reviewed the  following labs and imaging studies CBC    Component Value Date/Time   WBC 6.8 02/26/2021 1051   RBC 5.73 02/26/2021 1051   HGB 15.8 02/26/2021 1051   HCT 47.2 02/26/2021 1051   PLT 252 02/26/2021 1051   MCV 82.4 02/26/2021 1051   MCH 27.6 02/26/2021 1051   MCHC 33.5 02/26/2021 1051   RDW 14.3 02/26/2021 1051   LYMPHSABS 1.2 02/26/2021 1051   MONOABS 0.5 02/26/2021 1051   EOSABS 0.2 02/26/2021 1051   BASOSABS 0.1 02/26/2021 1051   BMP Latest Ref Rng & Units 02/26/2021 02/17/2021 02/07/2021  Glucose 70 - 99 mg/dL 238(H) 158(H) 185(H)  BUN 8 - 23 mg/dL 23 18 17   Creatinine 0.61 - 1.24 mg/dL 0.86 0.74 0.87  BUN/Creat Ratio 10 - 24 - - -  Sodium 135 - 145 mmol/L 132(L) 136 135  Potassium 3.5 - 5.1 mmol/L 3.7 3.7 4.1  Chloride 98 - 111 mmol/L 95(L) 100 97  CO2 22 - 32 mmol/L 27 28 31   Calcium 8.9 - 10.3 mg/dL 9.4 9.3 9.5    CT ANGIO HEAD NECK W WO CM  Result Date: 02/26/2021 CLINICAL DATA:  Stroke, follow up. Neuro deficit, acute, stroke suspected. Left thalamic lacunar infraction. EXAM: CT ANGIOGRAPHY HEAD AND  NECK TECHNIQUE: Multidetector CT imaging of the head and neck was performed using the standard protocol during bolus administration of intravenous contrast. Multiplanar CT image reconstructions and MIPs were obtained to evaluate the vascular anatomy. Carotid stenosis measurements (when applicable) are obtained utilizing NASCET criteria, using the distal internal carotid diameter as the denominator. RADIATION DOSE REDUCTION: This exam was performed according to the departmental dose-optimization program which includes automated exposure control, adjustment of the mA and/or kV according to patient size and/or use of iterative reconstruction technique. CONTRAST:  72mL OMNIPAQUE IOHEXOL 350 MG/ML SOLN COMPARISON:  Soft tissue neck CT 03/03/2017 FINDINGS: CTA NECK FINDINGS Aortic arch: Standard 3 vessel aortic arch with mild atherosclerotic plaque. No arch vessel origin stenosis. Right  carotid system: Patent with a small amount of calcified plaque at the carotid bifurcation and moderate amount of predominantly calcified plaque in the mid and distal cervical ICA. No evidence of a significant stenosis or dissection. Left carotid system: Patent with a small amount of calcified and soft plaque in the distal common carotid artery and at the carotid bifurcation and a small to moderate amount of calcified plaque in the distal cervical ICA. No evidence of a significant stenosis or dissection. Vertebral arteries: Patent without evidence a significant stenosis or dissection. Dominant right vertebral artery which is partially duplicated proximally. The duplicated segments both arise from the right subclavian artery and join at the C4-5 level. Skeleton: C5-6 fusion.  Right C7 laminectomy. Other neck: Subcentimeter left thyroid nodule for which no follow-up imaging is recommended. Upper chest: Small calcified nodule in the right upper lobe. Review of the MIP images confirms the above findings CTA HEAD FINDINGS Anterior circulation: The internal carotid arteries are patent from skull base to carotid termini with a mild to moderate amount of calcified plaque bilaterally not resulting in significant stenosis. ACAs and MCAs are patent without evidence of a proximal branch occlusion or significant proximal stenosis. No aneurysm is identified. Posterior circulation: Intracranial vertebral arteries are widely patent to the basilar. Patent PICA and SCA origins are seen bilaterally. The basilar artery is widely patent. There is a large right posterior communicating artery with absent right P1 segment. Both PCAs are patent without evidence of a significant proximal stenosis. No aneurysm is identified. Venous sinuses: Patent. Anatomic variants: Absent right A1 segment.  Fetal right PCA. Review of the MIP images confirms the above findings IMPRESSION: 1. Mild-to-moderate atherosclerosis in the head and neck without large  vessel occlusion, significant proximal stenosis, or aneurysm. 2. Aortic Atherosclerosis (ICD10-I70.0). Electronically Signed   By: Logan Bores M.D.   On: 02/26/2021 14:47   CT HEAD WO CONTRAST  Result Date: 02/26/2021 CLINICAL DATA:  66 year old male with right side numbness beginning last week but progressive. EXAM: CT HEAD WITHOUT CONTRAST TECHNIQUE: Contiguous axial images were obtained from the base of the skull through the vertex without intravenous contrast. RADIATION DOSE REDUCTION: This exam was performed according to the departmental dose-optimization program which includes automated exposure control, adjustment of the mA and/or kV according to patient size and/or use of iterative reconstruction technique. COMPARISON:  Head CT 07/31/2006. FINDINGS: Brain: Cerebral volume is within normal limits for age. Hypodense lacunar infarct lateral left thalamus near the posterior limb left internal capsule is new since 2008 and age indeterminate. Elsewhere gray-white matter differentiation is within normal limits. No superimposed midline shift, ventriculomegaly, mass effect, evidence of mass lesion, intracranial hemorrhage or evidence of cortically based acute infarction. Vascular: Extensive Calcified atherosclerosis at the skull base. No suspicious intracranial  vascular hyperdensity. Skull: No acute osseous abnormality identified. Sinuses/Orbits: Occasional mild paranasal sinus mucosal thickening and/or retention cysts. Sinus aeration appears improved compared to 2008. Tympanic cavities and mastoids are clear. Other: Postoperative changes to both globes. No acute orbit or scalp soft tissue finding. IMPRESSION: 1. Lacunar infarct in the Left Thalamus, age indeterminate by CT but suspicious for recent small vessel ischemia in this setting. 2. Otherwise negative for age noncontrast CT appearance of the brain. Electronically Signed   By: Genevie Ann M.D.   On: 02/26/2021 11:27   MR BRAIN WO CONTRAST  Result Date:  02/26/2021 CLINICAL DATA:  Neuro deficit, acute, stroke suspected EXAM: MRI HEAD WITHOUT CONTRAST TECHNIQUE: Multiplanar, multiecho pulse sequences of the brain and surrounding structures were obtained without intravenous contrast. COMPARISON:  Same day CT/CTA. FINDINGS: Brain: Acute left thalamocapsular infarct. Mild associated edema without mass effect. No evidence of acute hemorrhage. Small focus of susceptibility artifact in the right frontal periventricular lobe, probably chronic microhemorrhage. No mass lesion, midline shift, hydrocephalus, or extra-axial fluid collection. There are a few additional scattered punctate T2/FLAIR hyperintensities the white matter, nonspecific but compatible with chronic microvascular ischemic disease that is very mild for patient age. Vascular: Major arterial flow voids are maintained skull base. Better evaluated on same day CTA. Skull and upper cervical spine: Normal marrow signal. Sinuses/Orbits: Mild paranasal sinus mucosal thickening with small retention cyst in the right frontal sinus. Unremarkable orbits. Other: No mastoid effusions. IMPRESSION: Acute left thalamocapsular infarct.  Mild edema without mass effect. Electronically Signed   By: Margaretha Sheffield M.D.   On: 02/26/2021 16:31    Disposition Plan & Communication  Patient status: Observation  Admitted From: {From:23814} Planned disposition location: {PLAN; DISPOSITION:26386} Anticipated discharge date: *** pending ***  Family Communication: ***    Author: Richarda Osmond, DO [facility] 02/27/2021, 7:21 AM   Available by Epic secure chat 7AM-7PM. If 7PM-7AM, please contact night-coverage.  [facility] contact information found on [email or web address].

## 2021-02-27 NOTE — Plan of Care (Deleted)
  Problem: Health Behavior/Discharge Planning: Goal: Ability to manage health-related needs will improve Outcome: Progressing   Problem: Clinical Measurements: Goal: Respiratory complications will improve Outcome: Progressing   Problem: Clinical Measurements: Goal: Cardiovascular complication will be avoided Outcome: Progressing   Problem: Pain Managment: Goal: General experience of comfort will improve Outcome: Progressing   Problem: Safety: Goal: Ability to remain free from injury will improve Outcome: Progressing   

## 2021-02-27 NOTE — Progress Notes (Signed)
IV/saline lock and telemetry monitor discontinued by [facility] SN

## 2021-02-27 NOTE — Plan of Care (Signed)
°  Problem: Education: Goal: Knowledge of General Education information will improve Description: Including pain rating scale, medication(s)/side effects and non-pharmacologic comfort measures Outcome: Adequate for Discharge   Problem: Health Behavior/Discharge Planning: Goal: Ability to manage health-related needs will improve Outcome: Adequate for Discharge   Problem: Clinical Measurements: Goal: Ability to maintain clinical measurements within normal limits will improve Outcome: Adequate for Discharge Goal: Will remain free from infection Outcome: Adequate for Discharge Goal: Diagnostic test results will improve Outcome: Adequate for Discharge Goal: Respiratory complications will improve Outcome: Adequate for Discharge Goal: Cardiovascular complication will be avoided Outcome: Adequate for Discharge   Problem: Activity: Goal: Risk for activity intolerance will decrease Outcome: Adequate for Discharge   Problem: Nutrition: Goal: Adequate nutrition will be maintained Outcome: Adequate for Discharge   Problem: Coping: Goal: Level of anxiety will decrease Outcome: Adequate for Discharge   Problem: Elimination: Goal: Will not experience complications related to bowel motility Outcome: Adequate for Discharge Goal: Will not experience complications related to urinary retention Outcome: Adequate for Discharge   Problem: Pain Managment: Goal: General experience of comfort will improve Outcome: Adequate for Discharge   Problem: Safety: Goal: Ability to remain free from injury will improve Outcome: Adequate for Discharge   Problem: Skin Integrity: Goal: Risk for impaired skin integrity will decrease Outcome: Adequate for Discharge   Problem: Education: Goal: Knowledge of secondary prevention will improve (SELECT ALL) Outcome: Adequate for Discharge

## 2021-02-27 NOTE — Evaluation (Signed)
Occupational Therapy Evaluation Patient Details Name: Walter Salas MRN: [id number] DOB: 07/26/1955 Today's Date: 02/27/2021   History of Present Illness Patient is a 66 y.o. male with medical history significant of CAD with stenting, HTN, HLD, IIDM, Thoracic aneurysm, chronic diastolic CHF, BPH, chronic sciatica s/p lumbar spinal stimulator, came with strokelike symptoms that started about a week prior including right side numbness and tingling. MRI of brain reported Acute left thalamocapsular infarct and mild edema without mass effect.   Clinical Impression   Upon entering the room, pt supine in bed with family present in room. Pt reports being independent at baseline without use of AD. He does report prior numbness in R UE from nerve impingement at elbow. However, pt reporting increased numbness throughout R UE and LE as well as decreased sensation to R UE. Pt displayed independence in self care tasks and functional mobility during session. Although pt continues to have increased numbness he remains WFLs for all tasks. When asking family if they have concerns they reported he lives in a basement apartment and has several steps to manage. Pt demonstrates ambulation up/down flight of steps with L rail ( similar to home) at independent level and no physical assistance needed. OT encouraged pt to utilize R UE in dominant way but to be careful secondary to safety concerns with numbness and sensation loss. OT also directing pt and family to stroke education handbook and educated pt on secondary stroke risk. Pt with no further acute OT needs at this time and OT to SIGN OFF.      Recommendations for follow up therapy are one component of a multi-disciplinary discharge planning process, led by the attending physician.  Recommendations may be updated based on patient status, additional functional criteria and insurance authorization.   Follow Up Recommendations  No OT follow up    Assistance Recommended  at Discharge None     Functional Status Assessment  Patient has not had a recent decline in their functional status  Equipment Recommendations  None recommended by OT       Precautions / Restrictions Precautions Precautions: Fall Restrictions Weight Bearing Restrictions: No      Mobility Bed Mobility Overal bed mobility: Modified Independent                  Transfers Overall transfer level: Modified independent                        Balance Overall balance assessment: Modified Independent                                         ADL either performed or assessed with clinical judgement   ADL Overall ADL's : At baseline;Independent                                             Vision Patient Visual Report: No change from baseline              Pertinent Vitals/Pain Pain Assessment Pain Assessment: Faces Faces Pain Scale: Hurts a little bit Pain Location: right arm Pain Descriptors / Indicators: Numbness Pain Intervention(s): Limited activity within patient's tolerance     Hand Dominance Right   Extremity/Trunk Assessment Upper Extremity Assessment Upper Extremity Assessment: RUE  deficits/detail RUE Deficits / Details: Pt with nerve impingement before CVA at elbow but with new sensation loss and pt reporting increased numbness. coordination and strength is functional. RUE Sensation: decreased light touch RUE Coordination: decreased fine motor LUE Deficits / Details: WFL LUE Sensation: WNL LUE Coordination: WNL   Lower Extremity Assessment Lower Extremity Assessment: Defer to PT evaluation RLE Deficits / Details: 5/5 hip flexion, hip add/abd, knee extension, ankle dorsiflexion RLE Sensation: WNL RLE Coordination: WNL LLE Deficits / Details: 5/5 hip flexion, hip add/abd, knee extension, ankle dorsiflexion LLE Sensation:  (patient reports mild numbness but is able to detect light touch) LLE Coordination:  WNL (with foot tapping and functional ambulation)       Communication Communication Communication: No difficulties   Cognition Arousal/Alertness: Awake/alert Behavior During Therapy: WFL for tasks assessed/performed Overall Cognitive Status: Within Functional Limits for tasks assessed                                 General Comments: patient is alert and able to follow all commands without difficulty                Home Living Family/patient expects to be discharged to:: Private residence Living Arrangements: Spouse/significant other Available Help at Discharge: Family Type of Home: House Home Access: Stairs to enter     Home Layout: Other (Comment) (stays in basement suite with bathroom)         Bathroom Toilet: Standard     Home Equipment: None          Prior Functioning/Environment Prior Level of Function : Independent/Modified Independent             Mobility Comments: independent ADLs Comments: independent        OT Problem List:        OT Treatment/Interventions:      OT Goals(Current goals can be found in the care plan section) Acute Rehab OT Goals Patient Stated Goal: to go home OT Goal Formulation: With patient/family Time For Goal Achievement: 02/27/21 Potential to Achieve Goals: Good  OT Frequency:         AM-PAC OT "6 Clicks" Daily Activity     Outcome Measure Help from another person eating meals?: None Help from another person taking care of personal grooming?: None Help from another person toileting, which includes using toliet, bedpan, or urinal?: None Help from another person bathing (including washing, rinsing, drying)?: None Help from another person to put on and taking off regular upper body clothing?: None Help from another person to put on and taking off regular lower body clothing?: None 6 Click Score: 24   End of Session Nurse Communication: Mobility status  Activity Tolerance: Patient tolerated  treatment well Patient left: in bed;with call bell/phone within reach;with family/visitor present  OT Visit Diagnosis: Hemiplegia and hemiparesis;Muscle weakness (generalized) (M62.81) Hemiplegia - Right/Left: Right Hemiplegia - dominant/non-dominant: Dominant Hemiplegia - caused by: Unspecified                Time: 0865-7846 OT Time Calculation (min): 13 min Charges:  OT General Charges $OT Visit: 1 Visit OT Evaluation $OT Eval Low Complexity: 1 Low  Darleen Crocker, MS, OTR/L , CBIS ascom [phone]  02/27/21, 3:14 PM

## 2021-02-27 NOTE — Progress Notes (Signed)
Neurology Progress Note CARLY APPLEGATE MR# [id number] 02/27/2021  S: no overnight events; no new complaints.  O: Current vital signs: BP 134/79 (BP Location: Left Arm)    Pulse 72    Temp 98.2 F (36.8 C)    Resp 18    Ht 5\' 10"  (1.778 m)    Wt 103.4 kg    SpO2 95%    BMI 32.71 kg/m  Vital signs in last 24 hours: Temp:  [98 F (36.7 C)-98.9 F (37.2 C)] 98.2 F (36.8 C) (02/16 0520) Pulse Rate:  [71-88] 72 (02/16 0520) Resp:  [11-21] 18 (02/16 0520) BP: (131-145)/(69-85) 134/79 (02/16 0520) SpO2:  [90 %-98 %] 95 % (02/16 0520) Weight:  [103.4 kg] 103.4 kg (02/15 1049) GENERAL: Awake, alert in NAD HEENT: Normocephalic and atraumatic, moist mm, no LN++, no thyromegaly LUNGS: symmetric excursions bilaterally with no audible wheezes. CV: RR, equal pulses bilaterally. ABDOMEN: Soft, nontender, nondistended with normoactive BS Ext: warm, well perfused, intact peripheral pulses  NEURO:  Mental Status: AA&Ox3  Language: speech is fluent.  Intact naming, repetition, and comprehension. PERR. EOMI, visual fields full, no facial asymmetry, facial sensation intact, hearing intact. No evidence of tongue atrophy or fibrillations, tongue/uvula/soft palate midline elevates symmetrically  Normal sternocleidomastoid and trapezius muscle strength. Motor: good strength in all extremities. Tone: Tone and bulk is normal. Sensation: Mild numbness in right face, arm and leg. Coordination: FTN intact bilaterally, no ataxia in BLE. Gait - Normal  NIHSS 1  Labs Lab Results  Component Value Date   HGBA1C 6.5 (H) 02/26/2021   and  Lab Results  Component Value Date   LDLCALC UNABLE TO CALCULATE IF TRIGLYCERIDE OVER 400 mg/dL 02/27/2021   Direct LDL 94.2  Imaging I have reviewed images in epic and the results pertinent to this consultation are: MRI Brain showed acute left thalamocapsular infarct. Mild associated edema without mass effect. No evidence of acute hemorrhage. Small focus of  susceptibility artifact in the right frontal periventricular lobe likely chronic microhemorrhage.  Echocardiogram read as left ventricular EF 60 to 65%. The left ventricle has no regional wall motion abnormalities, no shunt or mention of LVT  Assessment: JAYESH MARBACH is a 66 y.o. male PMHx significant vascular risk factors (CAD, HLD, HTN, OSA, current smoker) now with late acute embolic lacunar stroke to the left thalamus. We discussed the possibility thalamic pain syndrome which usually resolves in several months.  We had a long talk about smoking which can cause his thoracic aortic aneurysms to grow, and he said he has quit in the past but restarted due to very significant life stressors. He does have chronic back arm and leg pain with neuropathy. In the setting of chronic pain and psychosocial stressors we agreed to increase duloxetine to 60mg  daily. He does have sleep apnea and he has not used it in years because it was not comfortable and it kept growing mold despite daily cleaning. Recommended he discuss a nasal adapter or a new apparatus with his PCP.  Recommendations: - He will need evaluation for Holter monitor to further evaluate for possible cardiac etiology. - Continue statin for goal LDL <70. - Continue aspirin 81mg  daily. - Continue clopidogrel 75mg  daily for 3 weeks. - BP goal <140/90. - Increase duloxetine to 60mg  daily. - Discuss with PCP about restarting CPAP.  - Try to quit smoking.   Electronically signed by:  Lynnae Sandhoff, MD Page: [phone] 02/27/2021, 8:44 AM  If 7pm- 7am, please page neurology on call as listed  in [place].

## 2021-02-27 NOTE — Discharge Instructions (Signed)
Please follow up with neurology within 1-2 weeks.  To decrease stroke risk, maintain good control of your cholesterol, blood sugars, and a healthy weight.  I recommend you do not smoke at all and get plenty of exercise.

## 2021-02-27 NOTE — Progress Notes (Signed)
MD order received in [facility] to discharge pt home today; verbally reviewed AVS with pt including medications, Rx escribed to [facility]; follow up appointment to call his PCP to get a referral to a neurologist for follow up in 1-2 weeks; no questions voiced at this time; pt's discharge pending arrival of a volunteer with a wheelchair for discharge

## 2021-02-27 NOTE — Evaluation (Signed)
Physical Therapy Evaluation Patient Details Name: Walter Salas MRN: [id number] DOB: May 24, 1955 Today's Date: 02/27/2021  History of Present Illness  Patient is a 66 y.o. male with medical history significant of CAD with stenting, HTN, HLD, IIDM, Thoracic aneurysm, chronic diastolic CHF, BPH, chronic sciatica s/p lumbar spinal stimulator, came with strokelike symptoms that started about a week prior including right side numbness and tingling. MRI of brain reported Acute left thalamocapsular infarct and mild edema without mass effect.   Clinical Impression  Patient is agreeable to PT. He reports he has right UE numbness but also reports he has nerve impingement in right elbow and neck and was having outpatient ortho consult soon. AROM of RUE grossly WFL for functional activity and numbness does not appear to impact functional independence during this assessment. Currently the patient is likely at or nearing baseline level of functional mobility. No focal weakness noted in BLE and patient is Mod I with ambulation close to 268ft in hallway without assistive device. No further acute PT needs at this time with no PT follow up recommended at discharge.     Recommendations for follow up therapy are one component of a multi-disciplinary discharge planning process, led by the attending physician.  Recommendations may be updated based on patient status, additional functional criteria and insurance authorization.  Follow Up Recommendations No PT follow up    Assistance Recommended at Discharge None  Patient can return home with the following   (anticipate need for assist with transportation)    Equipment Recommendations None recommended by PT  Recommendations for Other Services       Functional Status Assessment Patient has not had a recent decline in their functional status     Precautions / Restrictions Precautions Precautions: Fall Restrictions Weight Bearing Restrictions: No       Mobility  Bed Mobility Overal bed mobility: Modified Independent                  Transfers Overall transfer level: Modified independent                      Ambulation/Gait Ambulation/Gait assistance: Modified independent (Device/Increase time) Gait Distance (Feet): 190 Feet Assistive device: None Gait Pattern/deviations: Step-through pattern Gait velocity: normal walking speed     General Gait Details: no loss of balance with ambulation. no shortness of breath noted with activity  Stairs            Wheelchair Mobility    Modified Rankin (Stroke Patients Only)       Balance Overall balance assessment: Modified Independent                                           Pertinent Vitals/Pain Pain Assessment Pain Assessment: Faces Faces Pain Scale: Hurts a little bit Pain Location: right arm Pain Descriptors / Indicators: Numbness Pain Intervention(s): Limited activity within patient's tolerance    Home Living Family/patient expects to be discharged to:: Private residence Living Arrangements: Spouse/significant other Available Help at Discharge: Family Type of Home: House Home Access: Stairs to enter       Home Layout:  (stays in the basement with bed room and bath)        Prior Function Prior Level of Function : Independent/Modified Independent             Mobility Comments: independent ADLs Comments: independent  Hand Dominance   Dominant Hand: Right    Extremity/Trunk Assessment   Upper Extremity Assessment Upper Extremity Assessment: RUE deficits/detail;LUE deficits/detail RUE Deficits / Details: fair grip strength. AROM grossly WFL. see OT note for more details RUE Sensation: decreased light touch (patient reports he has nerve impingement in his neck and at the elbow with ortho consult pending as outpatient) RUE Coordination:  (finger to nose with mild dysmetria. patient reports having difficulty  using a fork earlier) LUE Deficits / Details: WFL LUE Sensation: WNL LUE Coordination: WNL    Lower Extremity Assessment Lower Extremity Assessment: RLE deficits/detail;LLE deficits/detail RLE Deficits / Details: 5/5 hip flexion, hip add/abd, knee extension, ankle dorsiflexion RLE Sensation: WNL RLE Coordination: WNL LLE Deficits / Details: 5/5 hip flexion, hip add/abd, knee extension, ankle dorsiflexion LLE Sensation:  (patient reports mild numbness but is able to detect light touch) LLE Coordination: WNL (with foot tapping and functional ambulation)       Communication   Communication: No difficulties  Cognition Arousal/Alertness: Awake/alert Behavior During Therapy: WFL for tasks assessed/performed Overall Cognitive Status: Within Functional Limits for tasks assessed                                 General Comments: patient is alert and able to follow all commands without difficulty        General Comments      Exercises     Assessment/Plan    PT Assessment Patient does not need any further PT services  PT Problem List         PT Treatment Interventions      PT Goals (Current goals can be found in the Care Plan section)  Acute Rehab PT Goals Patient Stated Goal: to return home PT Goal Formulation: All assessment and education complete, DC therapy    Frequency       Co-evaluation               AM-PAC PT "6 Clicks" Mobility  Outcome Measure Help needed turning from your back to your side while in a flat bed without using bedrails?: None Help needed moving from lying on your back to sitting on the side of a flat bed without using bedrails?: None Help needed moving to and from a bed to a chair (including a wheelchair)?: None Help needed standing up from a chair using your arms (e.g., wheelchair or bedside chair)?: None Help needed to walk in hospital room?: None Help needed climbing 3-5 steps with a railing? : None 6 Click Score: 24     End of Session   Activity Tolerance: Patient tolerated treatment well Patient left: in bed;with call bell/phone within reach Nurse Communication: Mobility status PT Visit Diagnosis: Other symptoms and signs involving the nervous system (F62.130)    Time: 8657-8469 PT Time Calculation (min) (ACUTE ONLY): 12 min   Charges:   PT Evaluation $PT Eval Low Complexity: 1 Low         Minna Merritts, PT, MPT   Percell Locus 02/27/2021, 11:06 AM

## 2021-02-28 ENCOUNTER — Telehealth: Payer: Self-pay | Admitting: Family Medicine

## 2021-02-28 ENCOUNTER — Other Ambulatory Visit: Payer: Self-pay | Admitting: Student in an Organized Health Care Education/Training Program

## 2021-02-28 DIAGNOSIS — I639 Cerebral infarction, unspecified: Secondary | ICD-10-CM

## 2021-02-28 NOTE — Telephone Encounter (Signed)
The Plavix is only supposed to be taken for 21 days after his stroke.  He will then go back to aspirin 81 mg once daily.  I am not sure if the patient requested this or if the pharmacy requested this refill.  Please inform him that he should only take the Plavix for 21 days and he will take an 81 mg aspirin at the same time.  At the end of 21 days he will discontinue the Plavix and continue on the aspirin.

## 2021-02-28 NOTE — Telephone Encounter (Signed)
Referral placed.  Has he been scheduled for hospital follow-up with me?  If he has not he should be scheduled as long as he is willing to come in.

## 2021-02-28 NOTE — Telephone Encounter (Signed)
Pt called in requesting a referral for neurology. Pt stated that he recently had a stroke. Pt requesting callback

## 2021-03-03 ENCOUNTER — Other Ambulatory Visit: Payer: Self-pay | Admitting: Family Medicine

## 2021-03-03 ENCOUNTER — Other Ambulatory Visit: Payer: Self-pay | Admitting: Internal Medicine

## 2021-03-03 DIAGNOSIS — M5442 Lumbago with sciatica, left side: Secondary | ICD-10-CM

## 2021-03-04 NOTE — Telephone Encounter (Signed)
I called the patient and scheduled a hospital follow up with the provider.  Walter Salas,cma

## 2021-03-12 DIAGNOSIS — R202 Paresthesia of skin: Secondary | ICD-10-CM | POA: Diagnosis not present

## 2021-03-12 DIAGNOSIS — E785 Hyperlipidemia, unspecified: Secondary | ICD-10-CM | POA: Diagnosis not present

## 2021-03-12 DIAGNOSIS — M5416 Radiculopathy, lumbar region: Secondary | ICD-10-CM | POA: Diagnosis not present

## 2021-03-12 DIAGNOSIS — I6381 Other cerebral infarction due to occlusion or stenosis of small artery: Secondary | ICD-10-CM | POA: Diagnosis not present

## 2021-03-12 DIAGNOSIS — F17210 Nicotine dependence, cigarettes, uncomplicated: Secondary | ICD-10-CM | POA: Diagnosis not present

## 2021-03-12 DIAGNOSIS — G4733 Obstructive sleep apnea (adult) (pediatric): Secondary | ICD-10-CM | POA: Diagnosis not present

## 2021-03-12 DIAGNOSIS — E1169 Type 2 diabetes mellitus with other specified complication: Secondary | ICD-10-CM | POA: Diagnosis not present

## 2021-03-14 ENCOUNTER — Inpatient Hospital Stay: Payer: Medicare Other | Admitting: Family Medicine

## 2021-03-18 DIAGNOSIS — G5631 Lesion of radial nerve, right upper limb: Secondary | ICD-10-CM | POA: Diagnosis not present

## 2021-03-18 DIAGNOSIS — G5601 Carpal tunnel syndrome, right upper limb: Secondary | ICD-10-CM | POA: Diagnosis not present

## 2021-03-19 ENCOUNTER — Telehealth: Payer: Self-pay | Admitting: Internal Medicine

## 2021-03-19 NOTE — Telephone Encounter (Signed)
Pt has been scheduled to see Christell Faith, PA-c, 04/14/2021, clearance will be addressed at that time. ? ?Will route back to the requesting surgeon's office to make them aware.  ?

## 2021-03-19 NOTE — Telephone Encounter (Signed)
? ?  Pre-operative Risk Assessment  ?  ?Patient Name: Walter Salas  ?DOB: Jul 18, 1955 ?MRN: [id number]  ? ?  ? ?Request for Surgical Clearance   ? ?Procedure:  Right Carpal and cubital release ? ?Date of Surgery:  Clearance 04/22/21                              ?   ?Surgeon:  Dr. Peggye Ley ?Surgeon's Group or Practice Name:  [facility]  ?Phone number:  [phone] ?Fax number:  [phone] ?  ?Type of Clearance Requested:   ?- Medical  ?  ?Type of Anesthesia:  Regional Block ?  ?Additional requests/questions:   ? ?Signed, ?Pilar A Ham   ?03/19/2021, 3:42 PM   ?

## 2021-03-19 NOTE — Telephone Encounter (Signed)
Primary Cardiologist:Christopher End, MD ? ?Chart reviewed as part of pre-operative protocol coverage. Because of Walter Salas's past medical history and time since last visit, he/she will require a follow-up visit in order to better assess preoperative cardiovascular risk. ? ?He is overdue for 6 month follow-up. We can attempt a virtual visit for upcoming surgery but he will also need to schedule with Dr. Saunders Revel or APP for office visit for regular follow-up.  ? ?Pre-op covering staff: ?- Please schedule appointment and call patient to inform them. ?- Please contact requesting surgeon's office via preferred method (i.e, phone, fax) to inform them of need for appointment prior to surgery. ? ?If applicable, this message will also be routed to pharmacy pool and/or primary cardiologist for input on holding anticoagulant/antiplatelet agent as requested below so that this information is available at time of patient's appointment.  ?Emmaline Life, NP-C ? ?  ?03/19/2021, 4:01 PM ?[place] ?[address], [address] ?Office [phone] Fax [phone] ? ?

## 2021-03-24 ENCOUNTER — Other Ambulatory Visit: Payer: Self-pay

## 2021-03-24 ENCOUNTER — Ambulatory Visit (INDEPENDENT_AMBULATORY_CARE_PROVIDER_SITE_OTHER): Payer: Medicare Other | Admitting: Family Medicine

## 2021-03-24 ENCOUNTER — Encounter: Payer: Self-pay | Admitting: Family Medicine

## 2021-03-24 VITALS — BP 130/70 | HR 85 | Temp 97.6°F | Ht 70.0 in | Wt 226.2 lb

## 2021-03-24 DIAGNOSIS — G8929 Other chronic pain: Secondary | ICD-10-CM

## 2021-03-24 DIAGNOSIS — G4733 Obstructive sleep apnea (adult) (pediatric): Secondary | ICD-10-CM | POA: Diagnosis not present

## 2021-03-24 DIAGNOSIS — E782 Mixed hyperlipidemia: Secondary | ICD-10-CM

## 2021-03-24 DIAGNOSIS — E785 Hyperlipidemia, unspecified: Secondary | ICD-10-CM

## 2021-03-24 DIAGNOSIS — M5442 Lumbago with sciatica, left side: Secondary | ICD-10-CM | POA: Diagnosis not present

## 2021-03-24 DIAGNOSIS — I6340 Cerebral infarction due to embolism of unspecified cerebral artery: Secondary | ICD-10-CM | POA: Diagnosis not present

## 2021-03-24 DIAGNOSIS — G5601 Carpal tunnel syndrome, right upper limb: Secondary | ICD-10-CM

## 2021-03-24 DIAGNOSIS — M545 Low back pain, unspecified: Secondary | ICD-10-CM | POA: Diagnosis not present

## 2021-03-24 DIAGNOSIS — G5600 Carpal tunnel syndrome, unspecified upper limb: Secondary | ICD-10-CM | POA: Insufficient documentation

## 2021-03-24 MED ORDER — TRAMADOL HCL 50 MG PO TABS
ORAL_TABLET | ORAL | 0 refills | Status: DC
Start: 2021-03-24 — End: 2021-04-14

## 2021-03-24 MED ORDER — EZETIMIBE 10 MG PO TABS: 10.0000 mg | ORAL_TABLET | Freq: Every day | ORAL | 3 refills | Status: DC

## 2021-03-24 NOTE — Assessment & Plan Note (Signed)
The patient was recently diagnosed with carpal tunnel syndrome and cubital tunnel syndrome on the right side.  He is scheduled for surgery.  I will have to confer with his neurologist regarding the duration of his Plavix prior to him having surgery.  He will continue to follow with his orthopedist. ?

## 2021-03-24 NOTE — Progress Notes (Signed)
Tommi Rumps, MD Phone: [phone]  Walter Salas is a 66 y.o. male who presents today for follow-up.  Stroke: Patient was hospitalized for a left lacunar infarct.  He initially developed right arm numbness and tingling and was sent for orthopedic evaluation.  Subsequently he developed right-sided arm and leg numbness and facial numbness and tingling.  He lost sense of taste in his right tongue.  He was found to have a left lacunar infarct.  He was started on Plavix and notes he was advised to take the Plavix for 30 days and then switch back to aspirin.  His statin was continued.  He followed up with neurology and it looks like they advised aspirin and Plavix in combination for 3 weeks and then discontinuing aspirin.  The patient's LDL was found to be above goal.  He continues to smoke though has cut down to 1 pack/day.  He had some right side pain that was attributed to his stroke.  It does not occur specifically with movement.  It has improved quite a bit.  He notes generally his symptoms have improved though he does still have some mild numbness in his right face.  His MRI from 02/26/2021 revealed an acute left thalamic capsular infarct.  There is mild associated edema without mass effect.  There is no evidence of acute hemorrhage.  There were no other acute changes.  His CT angio head and neck did not reveal any evidence of significant stenosis or dissection in the right carotid or left carotid system.  His vertebral arteries were patent without evidence of significant stenosis or dissection.  He had a subcentimeter left thyroid nodule which was not recommended for further follow-up.  His CTA head did not reveal any significant stenosis or occlusion.  His echo from 02/27/2021 revealed an EF of 60-65%.  There were no left ventricular wall motion abnormalities.  They could not evaluate diastolic function.  There is trivial mitral valve regurgitation.  He had trivial tricuspid valve  regurgitation.  OSA: He has been intolerant of a CPAP previously.  He does not have a dentist.  Neurology discussed a mandibular device with him.  Chronic low back pain: Patient notes the tramadol is beneficial for this.  It does not produce drowsiness.  Right carpal tunnel/cubital tunnel syndrome: The patient had an EMG that revealed right carpal tunnel and ulnar nerve entrapment.  He is scheduled for surgery on 4/7 with orthopedics.  Social History   Tobacco Use  Smoking Status Every Day   Packs/day: 1.00   Years: 50.00   Pack years: 50.00   Types: Cigarettes   Last attempt to quit: 02/15/2018   Years since quitting: 3.1  Smokeless Tobacco Never    Current Outpatient Medications on File Prior to Visit  Medication Sig Dispense Refill   amLODipine (NORVASC) 2.5 MG tablet TAKE ONE TABLET BY MOUTH EVERY DAY 90 tablet 0   aspirin 81 MG EC tablet Take 81 mg by mouth daily at 12 noon.     atorvastatin (LIPITOR) 80 MG tablet Take 80 mg by mouth daily.     chlorthalidone (HYGROTON) 50 MG tablet Take 25 mg by mouth daily.     DULoxetine 40 MG CPEP Take 20 mg by mouth daily. 30 capsule 0   fenofibrate (TRICOR) 145 MG tablet Take 1 tablet (145 mg total) by mouth daily. 30 tablet 0   glucose blood test strip Check once daily, E11.9 100 each 12   lidocaine (LIDODERM) 5 % 1 patch daily  as needed-Remove & Discard patch within 12 hours or as directed by MD     losartan (COZAAR) 25 MG tablet Take 25 mg by mouth daily.     metFORMIN (GLUCOPHAGE) 1000 MG tablet Take 1,000 mg by mouth 2 (two) times daily with a meal.     methyl salicylate liquid Apply 1 application topically as needed for muscle pain.     nitroGLYCERIN (NITROSTAT) 0.4 MG SL tablet Place 1 tablet (0.4 mg total) under the tongue every 5 (five) minutes as needed for chest pain. May take up to 3 doses. 30 tablet 0   omeprazole (PRILOSEC) 20 MG capsule Take 20 mg by mouth daily.     polyvinyl alcohol (LIQUIFILM TEARS) 1.4 % ophthalmic  solution Place 1 drop into both eyes as needed for dry eyes.      potassium chloride SA (K-DUR,KLOR-CON) 20 MEQ tablet Take 1 tablet (20 mEq total) by mouth daily. 90 tablet 3   pregabalin (LYRICA) 50 MG capsule Take 1 capsule (50 mg total) by mouth 3 (three) times daily. 30 capsule 0   Semaglutide, 1 MG/DOSE, (OZEMPIC, 1 MG/DOSE,) 4 MG/3ML SOPN INJECT 0.75 ML (1 MG) INTO THE SKIN ONCEA WEEK 15 mL 1   tamsulosin (FLOMAX) 0.4 MG CAPS capsule TAKE 1 CAPSULE BY MOUTH ONCE DAILY 30 capsule 3   VASCEPA 1 g capsule TAKE 2 CAPSULES BY MOUTH TWICE DAILY 120 capsule 3   vitamin B-12 (CYANOCOBALAMIN) 1000 MCG tablet Take 1 tablet (1,000 mcg total) by mouth daily. 30 tablet 0   DULoxetine (CYMBALTA) 20 MG capsule duloxetine 20 mg capsule,delayed release     metroNIDAZOLE (FLAGYL) 250 MG tablet metronidazole 250 mg tablet     pregabalin (LYRICA) 75 MG capsule Take 75 mg by mouth 3 (three) times daily.     No current facility-administered medications on file prior to visit.     ROS see history of present illness  Objective  Physical Exam Vitals:   03/24/21 1048  BP: 130/70  Pulse: 85  Temp: 97.6 F (36.4 C)  SpO2: 96%    BP Readings from Last 3 Encounters:  03/24/21 130/70  02/27/21 129/77  02/17/21 130/78   Wt Readings from Last 3 Encounters:  03/24/21 226 lb 3.2 oz (102.6 kg)  02/26/21 228 lb (103.4 kg)  02/17/21 228 lb (103.4 kg)    Physical Exam Constitutional:      General: He is not in acute distress.    Appearance: He is not diaphoretic.  Cardiovascular:     Rate and Rhythm: Normal rate and regular rhythm.     Heart sounds: Normal heart sounds.  Pulmonary:     Effort: Pulmonary effort is normal.     Breath sounds: Normal breath sounds.  Musculoskeletal:     Comments: No midline spine tenderness, no midline spine step-off, no muscular back tenderness, no right flank tenderness  Skin:    General: Skin is warm and dry.  Neurological:     Mental Status: He is alert.      Comments: Light touch sensation mildly decreased in right V2 and V3 otherwise CN 3-12 intact, 4/5 strength right grip, otherwise 5/5 strength in bilateral biceps, triceps, left grip, bilateral quads, hamstrings, plantar and dorsiflexion, sensation to light touch mildly decreased in his right upper and lower extremities, intact in left UE and LE, normal gait      Assessment/Plan: Please see individual problem list.  Problem List Items Addressed This Visit     Chronic back pain (Chronic)  Generally stable.  He can continue tramadol as prescribed.  Refill provided.  Controlled substance database reviewed.      Relevant Medications   DULoxetine (CYMBALTA) 20 MG capsule   pregabalin (LYRICA) 75 MG capsule   traMADol (ULTRAM) 50 MG tablet   Carpal tunnel syndrome    The patient was recently diagnosed with carpal tunnel syndrome and cubital tunnel syndrome on the right side.  He is scheduled for surgery.  I will have to confer with his neurologist regarding the duration of his Plavix prior to him having surgery.  He will continue to follow with his orthopedist.      Relevant Medications   DULoxetine (CYMBALTA) 20 MG capsule   pregabalin (LYRICA) 75 MG capsule   CVA (cerebral vascular accident) [facility])    The patient is progressively recovering from a recent lacunar infarct.  Discussed that the right side pain may have been related to a stroke.  That has been progressively improving and he will monitor that.  Discussed risk factor management.  His A1c is well controlled.  His LDL is not at goal and thus we will add Zetia to see if we can lower that to at least less than 70.  I discussed smoking cessation with the patient.  He has been working on cutting back though is not interested in quitting.  I discussed the risk of other cardiovascular disease with continued tobacco use.  I am going to contact his neurologist regarding his Plavix.  The patient is scheduled for surgery in April and I do not know  if he is able to come off of his Plavix at that time.      Relevant Medications   ezetimibe (ZETIA) 10 MG tablet   Mixed hyperlipidemia    LDL is above goal given recent stroke.  We will start him on Zetia.  He will continue Lipitor 80 mg once daily, Vascepa 2 capsules twice daily, and fenofibrate 1 tablet daily.  Recheck labs in 6 weeks.      Relevant Medications   ezetimibe (ZETIA) 10 MG tablet   OSA (obstructive sleep apnea)    The patient will check with his wife's dentist to see if they do mandibular devices and if they do he will set up an appointment.  If they do not he will let us know and we can get him referred to a dentist locally.      Other Visit Diagnoses     Hyperlipidemia, unspecified hyperlipidemia type    -  Primary   Relevant Medications   ezetimibe (ZETIA) 10 MG tablet   Other Relevant Orders   Hepatic function panel   LDL cholesterol, direct   Acute left-sided low back pain with left-sided sciatica       Relevant Medications   DULoxetine (CYMBALTA) 20 MG capsule   pregabalin (LYRICA) 75 MG capsule   traMADol (ULTRAM) 50 MG tablet       Health Maintenance: Ophthalmology records requested.  Return in about 3 months (around 06/24/2021) for History of stroke, hyperlipidemia, hypertension.  This visit occurred during the SARS-CoV-2 public health emergency.  Safety protocols were in place, including screening questions prior to the visit, additional usage of staff PPE, and extensive cleaning of exam room while observing appropriate contact time as indicated for disinfecting solutions.   I have spent 42 minutes in the care of this patient regarding history taking, documentation, chart review, discussion of plan, placing orders, completion of exam.   Tommi Rumps, MD [facility]  [place]

## 2021-03-24 NOTE — Assessment & Plan Note (Signed)
The patient will check with his wife's dentist to see if they do mandibular devices and if they do he will set up an appointment.  If they do not he will let us know and we can get him referred to a dentist locally. ?

## 2021-03-24 NOTE — Assessment & Plan Note (Addendum)
The patient is progressively recovering from a recent lacunar infarct.  Discussed that the right side pain may have been related to a stroke.  That has been progressively improving and he will monitor that.  Discussed risk factor management.  His A1c is well controlled.  His LDL is not at goal and thus we will add Zetia to see if we can lower that to at least less than 70.  I discussed smoking cessation with the patient.  He has been working on cutting back though is not interested in quitting.  I discussed the risk of other cardiovascular disease with continued tobacco use.  I am going to contact his neurologist regarding his Plavix.  The patient is scheduled for surgery in April and I do not know if he is able to come off of his Plavix at that time. ?

## 2021-03-24 NOTE — Assessment & Plan Note (Signed)
Generally stable.  He can continue tramadol as prescribed.  Refill provided.  Controlled substance database reviewed. ?

## 2021-03-24 NOTE — Assessment & Plan Note (Signed)
LDL is above goal given recent stroke.  We will start him on Zetia.  He will continue Lipitor 80 mg once daily, Vascepa 2 capsules twice daily, and fenofibrate 1 tablet daily.  Recheck labs in 6 weeks. ?

## 2021-03-24 NOTE — Patient Instructions (Signed)
Nice to see you. ?I will check with your neurologist regarding the Plavix. ?Please check with your dentist regarding the mandibular oral device for sleep apnea.  If they do not do this please let me know. ?We will start you on Zetia and recheck your labs in 6 weeks. ?If you have any recurrent stroke symptoms please go to the emergency room. ?

## 2021-03-28 ENCOUNTER — Telehealth: Payer: Self-pay | Admitting: Family Medicine

## 2021-03-28 DIAGNOSIS — I6340 Cerebral infarction due to embolism of unspecified cerebral artery: Secondary | ICD-10-CM

## 2021-03-28 NOTE — Telephone Encounter (Signed)
I attempted to call Dr Trena Platt office. They are closed after noon on Fridays. Can you try to call on Monday? These questions can be relayed to the nurse and they can call back with the answers.  ? ?I saw Mr Walter Salas for follow-up. He was confused on how long he was supposed to be on plavix after his stroke.  ? ?Is he supposed to take this in combination with aspirin for 3 weeks and then just go on plavix on an ongoing basis? ? ?He is scheduled for surgery in early April for his carpal tunnel syndrome and cubital tunnel syndrome that has caused right hand and arm weakness. How long does he need to be on the plavix prior to it being ok for this to be interrupted for surgery?  ? ?Thanks.  ?

## 2021-04-01 NOTE — Telephone Encounter (Signed)
I called and spoke with the nurse for Dr. Manuella Ghazi and I gave her the three questions and informed her to call back with the answers, she wrote tham down and stated she would call back.  Chin Wachter,cma  ?

## 2021-04-03 MED ORDER — CLOPIDOGREL BISULFATE 75 MG PO TABS: 75.0000 mg | ORAL_TABLET | Freq: Every day | ORAL | 1 refills | Status: DC

## 2021-04-03 NOTE — Addendum Note (Signed)
Addended by: Fulton Mole D on: 04/03/2021 03:02 PM ? ? Modules accepted: Orders ? ?

## 2021-04-03 NOTE — Telephone Encounter (Signed)
I received a call back from the nurse of Dr. Manuella Ghazi , he stated that the patient was to take plavix  for 3 weeks after his stroke and then he is to take it daily and avoid going off the plavix for at least 6 months, he also stated that if the the surgeon agrees he recommends to continue plavix after surgery but it is up to the surgeon. I called and relayed this information to the patient and he understood, I also sent in a refill for the patient of the plavix.  Janine Reller,cma    ?

## 2021-04-08 ENCOUNTER — Telehealth: Payer: Self-pay | Admitting: *Deleted

## 2021-04-08 NOTE — Telephone Encounter (Signed)
Noted. Can you check with the patient to see who is doing his carpal tunnel surgery? We need to let them know the neurologists recommendations.  ?

## 2021-04-08 NOTE — Telephone Encounter (Signed)
Patient called scheduling line left vm that he would like to cnl his upcoming apts for lab and Dr. Tasia Catchings. He is having a surgical procedure. I called patient back. Offered to r/s his apts. At this time, he will hold of r/s and call back later to r/s once he has recovered from his procedure. ?

## 2021-04-08 NOTE — Telephone Encounter (Signed)
I called the patient and spoke with him and he stated he did not know the providers name but [facility] is who is doing the surgery.  Marjean Imperato,cma  ?

## 2021-04-10 NOTE — Telephone Encounter (Signed)
I created a letter with this information in it. It should be faxed to [facility]. Please fax it and then call them to make sure they received it and gave it to the physician completing the patients surgery. Thanks.  ?

## 2021-04-10 NOTE — Telephone Encounter (Signed)
Letter was faxed to [facility]. Confirmation given, I called to [facility] to make sure it was received and it was received.  Jaycob Mcclenton,cma  ?

## 2021-04-11 ENCOUNTER — Other Ambulatory Visit: Payer: Medicare Other

## 2021-04-11 NOTE — Progress Notes (Signed)
? ?Cardiology Office Note   ? ?Date:  04/14/2021  ? ?ID:  Walter Salas, DOB May 05, 1955, MRN [id number] ? ?PCP:  Leone Haven, MD  ?Cardiologist:  Nelva Bush, MD  ?Electrophysiologist:  None  ? ?Chief Complaint: Follow-up ? ?History of Present Illness:  ? ?Walter Salas is a 66 y.o. male with history of CAD status post PCI to the LCx and RCA in 2014, HFpEF, thoracic ascending aortic aneurysm, recent lacunar infarct in 02/2021 as outlined below, DM2, HTN, HLD, ongoing tobacco use, nephrolithiasis, depression, possible peripheral neuropathy, fall with history of intracranial hemorrhage, and OSA intolerant to CPAP who presents for follow-up of his CAD and thoracic aortic aneurysm. ? ?He was initially evaluated by our office in 04/2016 after suffering several syncopal episodes in the preceding month.  Lexiscan MPI in 05/2016 showed a small defect of mild severity present in the basal anterolateral location that was very small and suggestive of prior infarct without ischemia.  EF 55 to 65%.  Overall, this was a low risk study.  Echo at that time showed an EF of 60 to 65%, no regional wall motion abnormalities, grade 1 diastolic dysfunction, mildly calcified aortic valve leaflets, mildly dilated aortic root measuring 4.2 cm, mildly dilated ascending thoracic aorta measuring 3.8 cm, normal RV systolic function and PASP.  Outpatient cardiac monitoring at that time showed a predominant rhythm of sinus with first-degree AV block with an average rate of 83 bpm (range 47 to 128 bpm, no significant arrhythmias or prolonged pauses, patient triggered events corresponded to sinus rhythm with first-degree AV block and sinus tachycardia.  Echo in 12/2017 showed an EF of 60 to 65%, mild concentric LVH, no regional wall motion abnormalities, aortic root measuring 4.3 cm, ascending thoracic aorta measuring 4.4 cm, mildly dilated left atrium, and a trivial pericardial effusion. ? ?He was last seen in the office in 06/2020 and was  doing well from a cardiac perspective, without symptoms of angina or decompensation.  His biggest concern at that time remained low back pain and left leg pain, though this had improved following spinal nerve stimulator implantation.  No cardiac changes were indicated. ? ?CTA chest/aorta in 12/2020 demonstrated a stable 4.3 cm aneurysm of the ascending thoracic aorta. ? ?He was admitted to the hospital in 02/2021 with a lacunar infarct to the left thalamus.  CTA head and neck showed mild to moderate atherosclerosis in the head and neck without large vessel occlusion, significant proximal stenosis, or aneurysm.  CT head without contrast showed a lacunar infarct in the left thalamus suspicious for recent small vessel ischemia.  MRI of the brain showed an acute left thalamocapsular infarct with mild edema and without mass effect.  Echo was performed and interpreted by outside cardiology group which demonstrated an EF of 60 to 65%, no regional wall motion abnormalities, indeterminate LV diastolic function parameters, normal RV systolic function and ventricular cavity size, and trivial mitral regurgitation.  EKG showed sinus rhythm.  Since his discharge, he has been evaluated by neurology as an outpatient with recommendation to continue aspirin and clopidogrel for 3 weeks, after which he can discontinue aspirin.   ? ?He reports he is scheduled to undergo a right carpal and cubital release on 04/18/2021.  Anesthesia via regional block.  His PCP has reached out to his neurologist, who recommended the patient not interrupt clopidogrel for 6 months given his recent CVA.  The patient indicates he discontinued clopidogrel on 04/12/2021 in preparation for the above procedure.  From  a cardiac perspective, he is doing well and is without symptoms of angina or decompensation.  No palpitations, dizziness, presyncope, or syncope.  He does have some residual right-sided weakness that remains following his CVA.  He remains active at  baseline without cardiac limitation.  He does continue to smoke three quarters a pack of cigarettes per day and is not interested in fully quitting.  No lower extremity swelling, abdominal distention, orthopnea, PND, or early satiety. ? ? ? ?[facility] Activity Status Index: >  4 METs without cardiac limitation ?Revised Cardiac Risk Index: Moderate risk for noncardiac procedure  ? ? ?Labs independently reviewed: ?02/2021 - direct LDL 80, TC 191, TG 513, HDL 27, A1c 6.6, potassium 3.7, BUN 23, serum creatinine 0.86, albumin 4.8, AST/ALT normal, Hgb 15.8, PLT 252, TSH normal ? ?Past Medical History:  ?Diagnosis Date  ? (HFpEF) heart failure with preserved ejection fraction ([place])   ? a. 2018 Echo: EF 60-65%, normal wall motion, Gr1DD, mild aortic valve calcification; b. 12/2017 Echo: EF 60-65%, no rwma, Ao root 4.3cm, Asc Ao 4.4 cm, mildly dil LA.  ? Agent orange exposure   ? Anginal pain ([place])   ? Arthritis   ? CAD (coronary artery disease)   ? a. s/p PCI/DES to the LCx and RCA in 2014; b. 05/2016 MV: EF 55-65%. Small, mild defect in basal anterolateral location->infarct w/o ischemia.  ? [facility])   ? skin  ? Chronic cough   ? Chronic pain   ? Colon polyp   ? Depression   ? Diabetes mellitus without complication ([place])   ? Diverticulosis   ? GERD (gastroesophageal reflux disease)   ? History of kidney stones   ? History of stomach ulcers   ? History of subdural hematoma 12/14/2012  ? HOH (hard of hearing)   ? Hx of laminectomy 1999  ? Hyperlipidemia   ? Hypertension   ? Myocardial infarction [facility])   ? Sleep apnea   ? Syncope   ? a. Event monitor 2018: NSR with first-degree AV block, no significant arrhythmias or pauses; b.  Myoview showed a small in size, mild in severity fixed basal and anterior lateral defect, no ischemia, LVEF 55 to 65%, low risk study  ? Thoracic aortic aneurysm ([name])   ? a. TTE 2018: Mildly dil Ao root- 4.2 cm; b. CTA aorta 6/18: Asc Ao aneurysmal dil w max diam of 4.3 cm; b. 12/2019 CTA Chest: stable  4.3cm thor Ao Aneurysm.  ? Ulcer   ? ? ?Past Surgical History:  ?Procedure Laterality Date  ? [place]  ? CARPAL TUNNEL RELEASE Bilateral 1990  ? CATARACT EXTRACTION W/PHACO Left 03/08/2018  ? Procedure: CATARACT EXTRACTION PHACO AND INTRAOCULAR LENS PLACEMENT ([place]) LEFT;  Surgeon: Birder Robson, MD;  Location: [facility] ORS;  Service: Ophthalmology;  Laterality: Left;  [place]  00:24.2 ?CDE 3.25 ?Fluid pack lot # [id number]  ? CATARACT EXTRACTION W/PHACO Right 05/24/2018  ? Procedure: CATARACT EXTRACTION PHACO AND INTRAOCULAR LENS PLACEMENT ([place]) RIGHT;  Surgeon: Birder Robson, MD;  Location: [place];  Service: Ophthalmology;  Laterality: Right;  Diabetic - oral meds  ? CERVICAL FUSION  2000. 2003  ? CHOLECYSTECTOMY  2000  ? COLONOSCOPY WITH PROPOFOL N/A 02/23/2017  ? Procedure: COLONOSCOPY WITH PROPOFOL;  Surgeon: Lollie Sails, MD;  Location[facility]COPY;  Service: Endoscopy;  Laterality: N/A;  ? CORONARY ANGIOPLASTY WITH STENT PLACEMENT  2014  ? [facility] [employer], [facility]  ?  ESOPHAGOGASTRODUODENOSCOPY (EGD) WITH PROPOFOL N/A 02/23/2017  ? Procedure: ESOPHAGOGASTRODUODENOSCOPY (EGD) WITH PROPOFOL;  Surgeon: Lollie Sails, MD;  Location[facility] ENDOSCOPY;  Service: Endoscopy;  Laterality: N/A;  ? PULSE GENERATOR IMPLANT N/A 03/04/2020  ? Procedure: PULSE GENERATOR PLACEMENT;  Surgeon: Deetta Perla, MD;  Location: [facility] ORS;  Service: Neurosurgery;  Laterality: N/A;  1ST CASE  ? SHOULDER ARTHROSCOPY Right 2012  ? THORACIC LAMINECTOMY FOR SPINAL CORD STIMULATOR N/A 02/26/2020  ? Procedure: THORACIC LAMINECTOMY FOR SPINAL CORD STIMULATOR PADDLE TRIAL;  Surgeon: Deetta Perla, MD;  Location: [facility] ORS;  Service: Neurosurgery;  Laterality: N/A;  1ST CASE  ? TUMOR EXCISION  2007  ? ? ?Current Medications: ?Current Meds  ?Medication Sig  ? amLODipine (NORVASC) 2.5 MG tablet TAKE ONE TABLET BY MOUTH EVERY DAY  ? aspirin 81 MG EC tablet Take 81 mg by mouth daily at  12 noon.  ? atorvastatin (LIPITOR) 80 MG tablet Take 80 mg by mouth daily.  ? chlorthalidone (HYGROTON) 50 MG tablet Take 25 mg by mouth daily.  ? clopidogrel (PLAVIX) 75 MG tablet Take 1 tablet (75 mg total)

## 2021-04-14 ENCOUNTER — Ambulatory Visit: Payer: Medicare Other | Admitting: Physician Assistant

## 2021-04-14 ENCOUNTER — Encounter: Payer: Self-pay | Admitting: Physician Assistant

## 2021-04-14 ENCOUNTER — Ambulatory Visit (INDEPENDENT_AMBULATORY_CARE_PROVIDER_SITE_OTHER): Payer: Medicare Other

## 2021-04-14 ENCOUNTER — Ambulatory Visit: Payer: Self-pay

## 2021-04-14 ENCOUNTER — Other Ambulatory Visit: Payer: Self-pay | Admitting: Family Medicine

## 2021-04-14 VITALS — BP 114/60 | HR 86 | Ht 70.0 in | Wt 223.0 lb

## 2021-04-14 DIAGNOSIS — Z720 Tobacco use: Secondary | ICD-10-CM

## 2021-04-14 DIAGNOSIS — I10 Essential (primary) hypertension: Secondary | ICD-10-CM | POA: Diagnosis not present

## 2021-04-14 DIAGNOSIS — E785 Hyperlipidemia, unspecified: Secondary | ICD-10-CM

## 2021-04-14 DIAGNOSIS — Z01810 Encounter for preprocedural cardiovascular examination: Secondary | ICD-10-CM

## 2021-04-14 DIAGNOSIS — M5442 Lumbago with sciatica, left side: Secondary | ICD-10-CM

## 2021-04-14 DIAGNOSIS — I6381 Other cerebral infarction due to occlusion or stenosis of small artery: Secondary | ICD-10-CM | POA: Diagnosis not present

## 2021-04-14 DIAGNOSIS — I7121 Aneurysm of the ascending aorta, without rupture: Secondary | ICD-10-CM | POA: Diagnosis not present

## 2021-04-14 DIAGNOSIS — I2510 Atherosclerotic heart disease of native coronary artery without angina pectoris: Secondary | ICD-10-CM

## 2021-04-14 DIAGNOSIS — E1140 Type 2 diabetes mellitus with diabetic neuropathy, unspecified: Secondary | ICD-10-CM | POA: Diagnosis not present

## 2021-04-14 DIAGNOSIS — I639 Cerebral infarction, unspecified: Secondary | ICD-10-CM

## 2021-04-14 NOTE — Patient Instructions (Addendum)
Medication Instructions:  ?No changes at this time.  ? ?*If you need a refill on your cardiac medications before your next appointment, please call your pharmacy* ? ? ?Lab Work: ?None ? ?If you have labs (blood work) drawn today and your tests are completely normal, you will receive your results only by: ?MyChart Message (if you have MyChart) OR ?A paper copy in the mail ?If you have any lab test that is abnormal or we need to change your treatment, we will call you to review the results. ? ? ?Testing/Procedures: ? ?CT Chest/Aorta in December 2023 ? ?Go to [facility] Entrance and check in at registration.  ? ?Scheduled for December 15, 2021 with arrival time of 08:30 AM. ? ?Liquids only for 4 hours prior to your test.  ? ?We will need labs prior to that test so please call us the first part of December to get that ordered & scheduled.  ? ?Your provider has ordered 2 heart monitors to wear for 2 weeks each. This will be mailed to your home with instructions on placement. Once you have finished the time frame requested, you will return monitor in box provided. ? ? ? ?Follow-Up: ?At [facility], you and your health needs are our priority.  As part of our continuing mission to provide you with exceptional heart care, we have created designated Provider Care Teams.  These Care Teams include your primary Cardiologist (physician) and Advanced Practice Providers (APPs -  Physician Assistants and Nurse Practitioners) who all work together to provide you with the care you need, when you need it. ? ? ?Your next appointment:   ?9 month(s) ? ?The format for your next appointment:   ?In Person ? ?Provider:   ?Nelva Bush, MD or Christell Faith, PA-C ?

## 2021-04-15 NOTE — Progress Notes (Signed)
Placed recall and printed for scheduling fu  ?

## 2021-04-17 ENCOUNTER — Other Ambulatory Visit: Payer: Self-pay | Admitting: Family Medicine

## 2021-04-18 ENCOUNTER — Ambulatory Visit: Payer: Medicare Other | Admitting: Oncology

## 2021-04-30 NOTE — Telephone Encounter (Signed)
Patient not available per wife .  Ok to call another time.  ?

## 2021-05-06 ENCOUNTER — Other Ambulatory Visit: Payer: Self-pay | Admitting: Family Medicine

## 2021-05-06 DIAGNOSIS — M5442 Lumbago with sciatica, left side: Secondary | ICD-10-CM

## 2021-05-14 ENCOUNTER — Other Ambulatory Visit: Payer: Self-pay | Admitting: Family Medicine

## 2021-05-16 DIAGNOSIS — G5601 Carpal tunnel syndrome, right upper limb: Secondary | ICD-10-CM | POA: Diagnosis not present

## 2021-05-16 DIAGNOSIS — G5621 Lesion of ulnar nerve, right upper limb: Secondary | ICD-10-CM | POA: Diagnosis not present

## 2021-05-16 DIAGNOSIS — Z7982 Long term (current) use of aspirin: Secondary | ICD-10-CM | POA: Diagnosis not present

## 2021-05-16 DIAGNOSIS — Z7984 Long term (current) use of oral hypoglycemic drugs: Secondary | ICD-10-CM | POA: Diagnosis not present

## 2021-05-16 DIAGNOSIS — Z7985 Long-term (current) use of injectable non-insulin antidiabetic drugs: Secondary | ICD-10-CM | POA: Diagnosis not present

## 2021-05-28 DIAGNOSIS — I639 Cerebral infarction, unspecified: Secondary | ICD-10-CM

## 2021-05-29 DIAGNOSIS — I639 Cerebral infarction, unspecified: Secondary | ICD-10-CM | POA: Diagnosis not present

## 2021-05-29 DIAGNOSIS — I472 Ventricular tachycardia, unspecified: Secondary | ICD-10-CM | POA: Diagnosis not present

## 2021-05-29 DIAGNOSIS — I4729 Other ventricular tachycardia: Secondary | ICD-10-CM | POA: Diagnosis not present

## 2021-06-05 ENCOUNTER — Other Ambulatory Visit: Payer: Self-pay | Admitting: Family Medicine

## 2021-06-05 DIAGNOSIS — M5442 Lumbago with sciatica, left side: Secondary | ICD-10-CM

## 2021-06-20 ENCOUNTER — Telehealth: Payer: Self-pay | Admitting: Internal Medicine

## 2021-06-20 MED ORDER — AMLODIPINE BESYLATE 2.5 MG PO TABS: 2.5000 mg | ORAL_TABLET | Freq: Every day | ORAL | 1 refills | Status: DC

## 2021-06-20 NOTE — Telephone Encounter (Signed)
*  STAT* If patient is at the pharmacy, call can be transferred to refill team.   1. Which medications need to be refilled? (please list name of each medication and dose if known) Amlodipine 2.'5mg'$ , 1 tablet daily  2. Which pharmacy/location (including street and city if local pharmacy) is medication to be sent to? [place]  3. Do they need a 30 day or 90 day supply? 90 day

## 2021-06-20 NOTE — Telephone Encounter (Signed)
Requested Prescriptions   Signed Prescriptions Disp Refills   amLODipine (NORVASC) 2.5 MG tablet 90 tablet 1    Sig: Take 1 tablet (2.5 mg total) by mouth daily.    Authorizing Provider: Rise Mu    Ordering User: Raelene Bott, Drue Harr L

## 2021-06-26 ENCOUNTER — Telehealth: Payer: Self-pay | Admitting: *Deleted

## 2021-06-26 NOTE — Telephone Encounter (Signed)
Left voicemail message for patient to call back for review of results  

## 2021-06-26 NOTE — Telephone Encounter (Signed)
-----   Message from Rise Mu, PA-C sent at 06/26/2021  7:18 AM EDT ----- Outpatient monitor showed a predominant rhythm of sinus with an average rate of 79 bpm (range 53 to 129 bpm in sinus), 2 episodes of NSVT lasting up to 18 beats with a maximum rate of 207 bpm, occasional PACs, rare PVCs, and no sustained arrhythmias or prolonged pauses.  Patient triggered events corresponded to sinus rhythm and PACs.  Await second ZIO.

## 2021-06-27 NOTE — Telephone Encounter (Signed)
Reviewed results with patient and advised we would call once second monitor results are available. He verbalized understanding with no further questions at this time.

## 2021-06-30 ENCOUNTER — Ambulatory Visit
Admission: RE | Admit: 2021-06-30 | Discharge: 2021-06-30 | Disposition: A | Discharge status: Home / Self Care | Payer: Medicare Other | Attending: Family Medicine | Admitting: Family Medicine

## 2021-06-30 ENCOUNTER — Encounter: Payer: Self-pay | Admitting: Family Medicine

## 2021-06-30 ENCOUNTER — Ambulatory Visit (INDEPENDENT_AMBULATORY_CARE_PROVIDER_SITE_OTHER): Payer: Medicare Other | Admitting: Family Medicine

## 2021-06-30 ENCOUNTER — Ambulatory Visit
Admission: RE | Admit: 2021-06-30 | Discharge: 2021-06-30 | Disposition: A | Discharge status: Home / Self Care | Payer: Medicare Other | Source: Ambulatory Visit | Attending: Family Medicine | Admitting: Family Medicine

## 2021-06-30 DIAGNOSIS — E785 Hyperlipidemia, unspecified: Secondary | ICD-10-CM

## 2021-06-30 DIAGNOSIS — R062 Wheezing: Secondary | ICD-10-CM | POA: Diagnosis not present

## 2021-06-30 DIAGNOSIS — Z8673 Personal history of transient ischemic attack (TIA), and cerebral infarction without residual deficits: Secondary | ICD-10-CM

## 2021-06-30 DIAGNOSIS — J989 Respiratory disorder, unspecified: Secondary | ICD-10-CM | POA: Diagnosis not present

## 2021-06-30 DIAGNOSIS — G8929 Other chronic pain: Secondary | ICD-10-CM

## 2021-06-30 DIAGNOSIS — M545 Low back pain, unspecified: Secondary | ICD-10-CM | POA: Diagnosis not present

## 2021-06-30 DIAGNOSIS — G5601 Carpal tunnel syndrome, right upper limb: Secondary | ICD-10-CM

## 2021-06-30 DIAGNOSIS — M5442 Lumbago with sciatica, left side: Secondary | ICD-10-CM | POA: Diagnosis not present

## 2021-06-30 DIAGNOSIS — E119 Type 2 diabetes mellitus without complications: Secondary | ICD-10-CM

## 2021-06-30 DIAGNOSIS — R0602 Shortness of breath: Secondary | ICD-10-CM | POA: Diagnosis not present

## 2021-06-30 DIAGNOSIS — I10 Essential (primary) hypertension: Secondary | ICD-10-CM

## 2021-06-30 DIAGNOSIS — R059 Cough, unspecified: Secondary | ICD-10-CM | POA: Diagnosis not present

## 2021-06-30 LAB — HEPATIC FUNCTION PANEL
ALT: 10 U/L (ref 0–53)
AST: 11 U/L (ref 0–37)
Albumin: 4.5 g/dL (ref 3.5–5.2)
Alkaline Phosphatase: 51 U/L (ref 39–117)
Bilirubin, Direct: 0.2 mg/dL (ref 0.0–0.3)
Total Bilirubin: 1 mg/dL (ref 0.2–1.2)
Total Protein: 7.2 g/dL (ref 6.0–8.3)

## 2021-06-30 LAB — LDL CHOLESTEROL, DIRECT: Direct LDL: 90 mg/dL

## 2021-06-30 MED ORDER — PREDNISONE 20 MG PO TABS: 40.0000 mg | ORAL_TABLET | Freq: Every day | ORAL | 0 refills | Status: DC

## 2021-06-30 MED ORDER — AZITHROMYCIN 250 MG PO TABS: ORAL_TABLET | ORAL | 0 refills | Status: AC

## 2021-06-30 MED ORDER — TRAMADOL HCL 50 MG PO TABS
ORAL_TABLET | ORAL | 0 refills | Status: DC
Start: 2021-06-30 — End: 2021-07-22

## 2021-06-30 NOTE — Assessment & Plan Note (Addendum)
A1c has been well controlled. Plan to check at next visit.  He will continue metformin 1000 mg twice daily and Ozempic 1 mg weekly.

## 2021-06-30 NOTE — Assessment & Plan Note (Addendum)
We will treat as bronchitis with azithromycin and prednisone.  He will have a chest x-ray to determine if there is an underlying pneumonia that was not heard on exam.  He will contact us if he is not improving with this regimen.  If he has worsening shortness of breath, develops chest pain, or has cough productive of bloody he will seek medical attention in the emergency department.  If he develops fevers he will let us know.  Patient declined COVID testing.  He was advised to avoid others while ill.

## 2021-06-30 NOTE — Assessment & Plan Note (Signed)
Status post surgical intervention.  I encouraged him to monitor for improvement in his symptoms and if they do not to improve he would need to follow-up with his surgeon.

## 2021-06-30 NOTE — Progress Notes (Signed)
Walter Rumps, MD Phone: [phone]  Walter Salas is a 66 y.o. male who presents today for f/u.  HYPERTENSION Disease Monitoring Home BP Monitoring not checking Chest pain- no    Dyspnea- none other than with his acute respiratory illness Medications Compliance-  taking amlodipine, chlorthalidone, losartan   Edema- no BMET    Component Value Date/Time   NA 132 (L) 02/26/2021 1051   NA 137 12/22/2018 0911   K 3.7 02/26/2021 1051   CL 95 (L) 02/26/2021 1051   CO2 27 02/26/2021 1051   GLUCOSE 238 (H) 02/26/2021 1051   BUN 23 02/26/2021 1051   BUN 9 12/22/2018 0911   CREATININE 0.86 02/26/2021 1051   CALCIUM 9.4 02/26/2021 1051   GFRNONAA >60 02/26/2021 1051   GFRAA >60 10/02/2019 1207   DIABETES Disease Monitoring: Blood Sugar ranges-130-150 Polyuria/phagia/dipsia- no      Optho- due Medications: Compliance- taking ozempic, metformin Hypoglycemic symptoms- no  History of stroke: Patient continues on Plavix, Zetia, fenofibrate, and Lipitor.  He denies any persistent or recurrent stroke symptoms.  He notes no numbness, weakness, vision changes, dizziness.  Cough: Patient notes onset of symptoms 7 days ago.  He has sinus and chest congestion.  He is coughing up yellow mucus.  He is blowing green/yellow mucus out of his nose.  He had some shortness of breath yesterday and today only when laying down.  He notes that is somewhat better today.  He has been wheezing.  Carpal tunnel syndrome: Patient had hand and elbow surgery recently.  He notes there is still some numbness in his right hand.  He notes the surgeon advised it could take several months before that numbness improved.  Chronic back pain: Patient continues on tramadol.  He notes this works well.  No drowsiness with this.  Social History   Tobacco Use  Smoking Status Every Day   Packs/day: 1.00   Years: 50.00   Total pack years: 50.00   Types: Cigarettes   Last attempt to quit: 02/15/2018   Years since quitting:  3.3  Smokeless Tobacco Never    Current Outpatient Medications on File Prior to Visit  Medication Sig Dispense Refill   amLODipine (NORVASC) 2.5 MG tablet Take 1 tablet (2.5 mg total) by mouth daily. 90 tablet 1   aspirin 81 MG EC tablet Take 81 mg by mouth daily at 12 noon.     atorvastatin (LIPITOR) 80 MG tablet Take 80 mg by mouth daily.     chlorthalidone (HYGROTON) 50 MG tablet Take 25 mg by mouth daily.     clopidogrel (PLAVIX) 75 MG tablet Take 1 tablet (75 mg total) by mouth daily. 90 tablet 1   DULoxetine (CYMBALTA) 20 MG capsule duloxetine 20 mg capsule,delayed release     DULoxetine 40 MG CPEP Take 20 mg by mouth daily. 30 capsule 0   ezetimibe (ZETIA) 10 MG tablet Take 1 tablet (10 mg total) by mouth daily. 90 tablet 3   fenofibrate (TRICOR) 145 MG tablet Take 1 tablet (145 mg total) by mouth daily. 30 tablet 0   glucose blood test strip Check once daily, E11.9 100 each 12   losartan (COZAAR) 25 MG tablet Take 25 mg by mouth daily.     metFORMIN (GLUCOPHAGE) 1000 MG tablet Take 1,000 mg by mouth 2 (two) times daily with a meal.     methyl salicylate liquid Apply 1 application topically as needed for muscle pain.     metroNIDAZOLE (FLAGYL) 250 MG tablet metronidazole 250  mg tablet     nitroGLYCERIN (NITROSTAT) 0.4 MG SL tablet Place 1 tablet (0.4 mg total) under the tongue every 5 (five) minutes as needed for chest pain. May take up to 3 doses. 30 tablet 0   omeprazole (PRILOSEC) 20 MG capsule Take 20 mg by mouth daily.     polyvinyl alcohol (LIQUIFILM TEARS) 1.4 % ophthalmic solution Place 1 drop into both eyes as needed for dry eyes.      potassium chloride SA (K-DUR,KLOR-CON) 20 MEQ tablet Take 1 tablet (20 mEq total) by mouth daily. 90 tablet 3   pregabalin (LYRICA) 50 MG capsule Take 1 capsule (50 mg total) by mouth 3 (three) times daily. 30 capsule 0   pregabalin (LYRICA) 75 MG capsule Take 75 mg by mouth 3 (three) times daily.     Semaglutide, 1 MG/DOSE, (OZEMPIC, 1  MG/DOSE,) 4 MG/3ML SOPN INJECT 0.75ML ('1MG'$ ) INTO THE SKIN ONCE AWEEK 15 mL 1   tamsulosin (FLOMAX) 0.4 MG CAPS capsule TAKE 1 CAPSULE BY MOUTH ONCE DAILY 30 capsule 3   VASCEPA 1 g capsule TAKE 2 CAPSULES BY MOUTH TWICE DAILY 120 capsule 3   vitamin B-12 (CYANOCOBALAMIN) 1000 MCG tablet Take 1 tablet (1,000 mcg total) by mouth daily. 30 tablet 0   lidocaine (LIDODERM) 5 % 1 patch daily as needed-Remove & Discard patch within 12 hours or as directed by MD (Patient not taking: Reported on 06/30/2021)     No current facility-administered medications on file prior to visit.     ROS see history of present illness  Objective  Physical Exam Vitals:   06/30/21 0923  BP: 124/70  Pulse: 100  Resp: 14  Temp: 98.4 F (36.9 C)  SpO2: 96%    BP Readings from Last 3 Encounters:  06/30/21 124/70  04/14/21 114/60  03/24/21 130/70   Wt Readings from Last 3 Encounters:  06/30/21 207 lb 3.2 oz (94 kg)  04/14/21 223 lb (101.2 kg)  03/24/21 226 lb 3.2 oz (102.6 kg)    Physical Exam Constitutional:      General: He is not in acute distress.    Appearance: He is not diaphoretic.  Cardiovascular:     Rate and Rhythm: Normal rate and regular rhythm.     Heart sounds: Normal heart sounds.  Pulmonary:     Effort: Pulmonary effort is normal.     Breath sounds: Wheezing (Scattered inspiratory and expiratory wheezes) present. No rhonchi or rales.  Skin:    General: Skin is warm and dry.  Neurological:     Mental Status: He is alert.      Assessment/Plan: Please see individual problem list.  Problem List Items Addressed This Visit     Chronic back pain (Chronic)    Generally stable.  Tramadol refilled.  Controlled substance database reviewed.      Relevant Medications   predniSONE (DELTASONE) 20 MG tablet   traMADol (ULTRAM) 50 MG tablet   Diabetes mellitus, type 2 (HCC) (Chronic)    A1c has been well controlled. Plan to check at next visit.  He will continue metformin 1000 mg twice  daily and Ozempic 1 mg weekly.      Essential hypertension (Chronic)    Adequately controlled.  He will continue losartan 25 mg daily, chlorthalidone 25 mg daily, and amlodipine 2.5 mg daily.      History of CVA (cerebrovascular accident) (Chronic)    Continue risk factor control.  He will continue Vascepa 2 g twice daily, Zetia 10 mg daily,  Lipitor 80 mg daily, and Plavix 75 mg daily.  Check LDL given that he has more recently been started on the Zetia.      Carpal tunnel syndrome    Status post surgical intervention.  I encouraged him to monitor for improvement in his symptoms and if they do not to improve he would need to follow-up with his surgeon.      Respiratory illness    We will treat as bronchitis with azithromycin and prednisone.  He will have a chest x-ray to determine if there is an underlying pneumonia that was not heard on exam.  He will contact us if he is not improving with this regimen.  If he has worsening shortness of breath, develops chest pain, or has cough productive of bloody he will seek medical attention in the emergency department.  If he develops fevers he will let us know.  Patient declined COVID testing.  He was advised to avoid others while ill.      Relevant Medications   azithromycin (ZITHROMAX) 250 MG tablet   predniSONE (DELTASONE) 20 MG tablet   Other Relevant Orders   DG Chest 2 View   Other Visit Diagnoses     Acute left-sided low back pain with left-sided sciatica       Relevant Medications   predniSONE (DELTASONE) 20 MG tablet   traMADol (ULTRAM) 50 MG tablet   Hyperlipidemia, unspecified hyperlipidemia type           Return in about 3 months (around 09/30/2021).   Walter Rumps, MD [place]

## 2021-06-30 NOTE — Assessment & Plan Note (Signed)
Continue risk factor control.  He will continue Vascepa 2 g twice daily, Zetia 10 mg daily, Lipitor 80 mg daily, and Plavix 75 mg daily.  Check LDL given that he has more recently been started on the Zetia.

## 2021-06-30 NOTE — Patient Instructions (Signed)
Nice to see you. We will check lab work today and contact you with the results. Please go to the outpatient imaging center for your chest x-ray. We will start you on azithromycin and prednisone for likely bronchitis.  If you are not improving please let us know. If you develop chest pain, shortness of breath that is worsening, or cough productive of blood please go to the emergency department.  If you develop fevers please let us know.

## 2021-06-30 NOTE — Assessment & Plan Note (Signed)
Adequately controlled.  He will continue losartan 25 mg daily, chlorthalidone 25 mg daily, and amlodipine 2.5 mg daily.

## 2021-06-30 NOTE — Assessment & Plan Note (Signed)
Generally stable.  Tramadol refilled.  Controlled substance database reviewed.

## 2021-07-03 ENCOUNTER — Other Ambulatory Visit: Payer: Self-pay | Admitting: Family Medicine

## 2021-07-03 ENCOUNTER — Other Ambulatory Visit: Payer: Self-pay

## 2021-07-03 DIAGNOSIS — I6340 Cerebral infarction due to embolism of unspecified cerebral artery: Secondary | ICD-10-CM

## 2021-07-03 DIAGNOSIS — E785 Hyperlipidemia, unspecified: Secondary | ICD-10-CM

## 2021-07-03 MED ORDER — REPATHA SURECLICK 140 MG/ML ~~LOC~~ SOAJ: 140.0000 mg | SUBCUTANEOUS | 1 refills | Status: DC

## 2021-07-08 ENCOUNTER — Telehealth: Payer: Self-pay

## 2021-07-10 ENCOUNTER — Other Ambulatory Visit: Payer: Self-pay | Admitting: Family Medicine

## 2021-07-21 ENCOUNTER — Other Ambulatory Visit: Payer: Self-pay | Admitting: Family Medicine

## 2021-07-21 DIAGNOSIS — M5442 Lumbago with sciatica, left side: Secondary | ICD-10-CM

## 2021-08-06 ENCOUNTER — Encounter: Payer: Self-pay | Admitting: Family Medicine

## 2021-08-06 ENCOUNTER — Ambulatory Visit (INDEPENDENT_AMBULATORY_CARE_PROVIDER_SITE_OTHER): Payer: Medicare Other | Admitting: Family Medicine

## 2021-08-06 DIAGNOSIS — E538 Deficiency of other specified B group vitamins: Secondary | ICD-10-CM | POA: Diagnosis not present

## 2021-08-06 DIAGNOSIS — R1111 Vomiting without nausea: Secondary | ICD-10-CM | POA: Diagnosis not present

## 2021-08-06 DIAGNOSIS — M545 Low back pain, unspecified: Secondary | ICD-10-CM | POA: Diagnosis not present

## 2021-08-06 DIAGNOSIS — E1169 Type 2 diabetes mellitus with other specified complication: Secondary | ICD-10-CM

## 2021-08-06 DIAGNOSIS — E785 Hyperlipidemia, unspecified: Secondary | ICD-10-CM | POA: Diagnosis not present

## 2021-08-06 DIAGNOSIS — F17210 Nicotine dependence, cigarettes, uncomplicated: Secondary | ICD-10-CM

## 2021-08-06 DIAGNOSIS — R634 Abnormal weight loss: Secondary | ICD-10-CM | POA: Insufficient documentation

## 2021-08-06 DIAGNOSIS — E119 Type 2 diabetes mellitus without complications: Secondary | ICD-10-CM

## 2021-08-06 DIAGNOSIS — I10 Essential (primary) hypertension: Secondary | ICD-10-CM | POA: Diagnosis not present

## 2021-08-06 DIAGNOSIS — R1110 Vomiting, unspecified: Secondary | ICD-10-CM | POA: Insufficient documentation

## 2021-08-06 DIAGNOSIS — E559 Vitamin D deficiency, unspecified: Secondary | ICD-10-CM

## 2021-08-06 DIAGNOSIS — G8929 Other chronic pain: Secondary | ICD-10-CM

## 2021-08-06 LAB — COMPREHENSIVE METABOLIC PANEL
ALT: 15 U/L (ref 0–53)
AST: 18 U/L (ref 0–37)
Albumin: 4.7 g/dL (ref 3.5–5.2)
Alkaline Phosphatase: 47 U/L (ref 39–117)
BUN: 13 mg/dL (ref 6–23)
CO2: 31 mEq/L (ref 19–32)
Calcium: 9.7 mg/dL (ref 8.4–10.5)
Chloride: 94 mEq/L — ABNORMAL LOW (ref 96–112)
Creatinine, Ser: 0.76 mg/dL (ref 0.40–1.50)
GFR: 92.85 mL/min (ref >60.00)
Glucose, Bld: 111 mg/dL — ABNORMAL HIGH (ref 70–99)
Potassium: 3.6 mEq/L (ref 3.5–5.1)
Sodium: 134 mEq/L — ABNORMAL LOW (ref 135–145)
Total Bilirubin: 0.9 mg/dL (ref 0.2–1.2)
Total Protein: 7.3 g/dL (ref 6.0–8.3)

## 2021-08-06 LAB — HEMOGLOBIN A1C: Hgb A1c MFr Bld: 6.4 % (ref 4.6–6.5)

## 2021-08-06 LAB — LIPID PANEL
Cholesterol: 99 mg/dL (ref 0–200)
HDL: 35.5 mg/dL — ABNORMAL LOW (ref >39.00)
LDL Cholesterol: 41 mg/dL (ref 0–99)
NonHDL: 63.78
Total CHOL/HDL Ratio: 3
Triglycerides: 113 mg/dL (ref 0.0–149.0)
VLDL: 22.6 mg/dL (ref 0.0–40.0)

## 2021-08-06 LAB — VITAMIN D 25 HYDROXY (VIT D DEFICIENCY, FRACTURES): VITD: 43.09 ng/mL (ref 30.00–100.00)

## 2021-08-06 LAB — VITAMIN B12: Vitamin B-12: 239 pg/mL (ref 211–911)

## 2021-08-06 NOTE — Assessment & Plan Note (Signed)
Possibly stress related.  Could be related to medication.  Discussed trying to eat something about 30 minutes after he takes his omeprazole and see if that makes a difference.  If its not making a difference we will need to have him hold his Ozempic for a couple of weeks to see if that is beneficial.

## 2021-08-06 NOTE — Assessment & Plan Note (Signed)
Likely related to a combination of reduce portion sizes as well as being on Ozempic.  We will continue to monitor.

## 2021-08-06 NOTE — Assessment & Plan Note (Signed)
Check vitamin D with lab work.

## 2021-08-06 NOTE — Assessment & Plan Note (Signed)
Stable.  Patient can continue tramadol 100 mg every 8 hours as needed for pain.

## 2021-08-06 NOTE — Assessment & Plan Note (Signed)
Adequately controlled.  The patient will continue losartan 25 mg daily and amlodipine 2.5 mg daily.

## 2021-08-06 NOTE — Assessment & Plan Note (Signed)
I encouraged smoking cessation.  The patient is not ready to quit.

## 2021-08-06 NOTE — Progress Notes (Signed)
Tommi Rumps, MD Phone: [phone]  Walter Salas is a 66 y.o. male who presents today for f/u.  HYPERTENSION Disease Monitoring: Blood pressure range-not checking Chest pain- no      Dyspnea- no Medications: Compliance- taking amlodipine, losartan    Edema- no  DIABETES Disease Monitoring: Blood Sugar ranges-114-145 Polyuria/phagia/dipsia- no      Optho- due Medications: Compliance- taking metformin, ozempic Hypoglycemic symptoms- no  HYPERLIPIDEMIA Disease Monitoring: See symptoms for Hypertension Medications: Compliance- taking fenofibrate, zetia, repatha, lipitor, vascepa Right upper quadrant pain- no  Muscle aches- no  Chronic low back pain: Patient notes this is adequately controlled with taking tramadol once in the morning and once in the afternoon.  He notes minimal drowsiness with this.  Vomiting: Patient notes on 3 occurrences over the last week he has vomited in the morning before he eats anything.  He notes no nausea, diarrhea, or abdominal pain with this.  He notes the vomitus is yellowish in color.  He notes no new medications or supplements.  He does report stress related to his wife having dementia and him being her sole caregiver at the moment while his daughter is out of town.  He notes no anxiety or depression.  Weight loss: Patient notes he has not specifically been trying to lose weight though since he had a stroke he is lost his appetite for food to a certain degree.  His portion sizes have reduced quite a bit.  He feels well overall.  He notes no night sweats or odd itching.  Social History   Tobacco Use  Smoking Status Every Day   Packs/day: 1.00   Years: 50.00   Total pack years: 50.00   Types: Cigarettes   Last attempt to quit: 02/15/2018   Years since quitting: 3.4  Smokeless Tobacco Never    Current Outpatient Medications on File Prior to Visit  Medication Sig Dispense Refill   amLODipine (NORVASC) 2.5 MG tablet Take 1 tablet (2.5 mg  total) by mouth daily. 90 tablet 1   aspirin 81 MG EC tablet Take 81 mg by mouth daily at 12 noon.     atorvastatin (LIPITOR) 80 MG tablet Take 80 mg by mouth daily.     chlorthalidone (HYGROTON) 50 MG tablet Take 25 mg by mouth daily.     clopidogrel (PLAVIX) 75 MG tablet TAKE 1 TABLET BY MOUTH DAILY. 90 tablet 1   DULoxetine (CYMBALTA) 20 MG capsule duloxetine 20 mg capsule,delayed release     DULoxetine 40 MG CPEP Take 20 mg by mouth daily. 30 capsule 0   Evolocumab (REPATHA SURECLICK) 811 MG/ML SOAJ Inject 140 mg into the skin every 14 (fourteen) days. 2 mL 1   ezetimibe (ZETIA) 10 MG tablet Take 1 tablet (10 mg total) by mouth daily. 90 tablet 3   fenofibrate (TRICOR) 145 MG tablet Take 1 tablet (145 mg total) by mouth daily. 30 tablet 0   glucose blood test strip Check once daily, E11.9 100 each 12   lidocaine (LIDODERM) 5 % 1 patch daily as needed-Remove & Discard patch within 12 hours or as directed by MD     losartan (COZAAR) 25 MG tablet Take 25 mg by mouth daily.     metFORMIN (GLUCOPHAGE) 1000 MG tablet Take 1,000 mg by mouth 2 (two) times daily with a meal.     methyl salicylate liquid Apply 1 application topically as needed for muscle pain.     metroNIDAZOLE (FLAGYL) 250 MG tablet metronidazole 250 mg tablet  nitroGLYCERIN (NITROSTAT) 0.4 MG SL tablet Place 1 tablet (0.4 mg total) under the tongue every 5 (five) minutes as needed for chest pain. May take up to 3 doses. 30 tablet 0   omeprazole (PRILOSEC) 20 MG capsule Take 20 mg by mouth daily.     polyvinyl alcohol (LIQUIFILM TEARS) 1.4 % ophthalmic solution Place 1 drop into both eyes as needed for dry eyes.      potassium chloride SA (K-DUR,KLOR-CON) 20 MEQ tablet Take 1 tablet (20 mEq total) by mouth daily. 90 tablet 3   predniSONE (DELTASONE) 20 MG tablet Take 2 tablets (40 mg total) by mouth daily with breakfast. 10 tablet 0   pregabalin (LYRICA) 50 MG capsule Take 1 capsule (50 mg total) by mouth 3 (three) times daily. 30  capsule 0   pregabalin (LYRICA) 75 MG capsule Take 75 mg by mouth 3 (three) times daily.     Semaglutide, 1 MG/DOSE, (OZEMPIC, 1 MG/DOSE,) 4 MG/3ML SOPN INJECT 0.75ML (1MG) INTO THE SKIN ONCE AWEEK 15 mL 1   tamsulosin (FLOMAX) 0.4 MG CAPS capsule TAKE 1 CAPSULE BY MOUTH ONCE DAILY 30 capsule 3   traMADol (ULTRAM) 50 MG tablet TAKE 2 TABLETS BY MOUTH EVERY 8 HOURS ASNEEDED FOR MODERATE PAIN. 90 tablet 0   VASCEPA 1 g capsule TAKE 2 CAPSULES BY MOUTH TWICE DAILY 120 capsule 3   vitamin B-12 (CYANOCOBALAMIN) 1000 MCG tablet Take 1 tablet (1,000 mcg total) by mouth daily. 30 tablet 0   No current facility-administered medications on file prior to visit.     ROS see history of present illness  Objective  Physical Exam Vitals:   08/06/21 0823  BP: 100/60  Pulse: 75  Temp: 97.9 F (36.6 C)  SpO2: 98%    BP Readings from Last 3 Encounters:  08/06/21 100/60  06/30/21 124/70  04/14/21 114/60   Wt Readings from Last 3 Encounters:  08/06/21 195 lb (88.5 kg)  06/30/21 207 lb 3.2 oz (94 kg)  04/14/21 223 lb (101.2 kg)    Physical Exam Constitutional:      General: He is not in acute distress.    Appearance: He is not diaphoretic.  Cardiovascular:     Rate and Rhythm: Normal rate and regular rhythm.     Heart sounds: Normal heart sounds.  Pulmonary:     Effort: Pulmonary effort is normal.     Breath sounds: Normal breath sounds.  Abdominal:     General: Bowel sounds are normal. There is no distension.     Palpations: Abdomen is soft. There is no mass.     Tenderness: There is no abdominal tenderness.     Hernia: No hernia is present.  Musculoskeletal:     Right lower leg: No edema.     Left lower leg: No edema.  Skin:    General: Skin is warm and dry.  Neurological:     Mental Status: He is alert.      Assessment/Plan: Please see individual problem list.  Problem List Items Addressed This Visit     B12 deficiency (Chronic)    Check B12 with lab work.       Relevant Orders   B12   Chronic back pain (Chronic)    Stable.  Patient can continue tramadol 100 mg every 8 hours as needed for pain.      Diabetes mellitus, type 2 (HCC) (Chronic)    Check A1c.  He will continue Ozempic 1 mg weekly and metformin 1000 mg twice daily.  Discussed his vomiting could be related to the Ozempic and we may have to have him hold that for several weeks if it does not improve with interventions as outlined under that problem.      Relevant Orders   HgB A1c   Essential hypertension (Chronic)    Adequately controlled.  The patient will continue losartan 25 mg daily and amlodipine 2.5 mg daily.      Relevant Orders   Comp Met (CMET)   Lipid panel   Vitamin D deficiency (Chronic)    Check vitamin D with lab work.      Relevant Orders   Vitamin D (25 hydroxy)   Hyperlipidemia associated with type 2 diabetes mellitus ([place])    Patient will continue Vascepa 2 g twice daily, fenofibrate 145 mg daily, Zetia 10 mg daily, Repatha 140 mg every 14 days, and Lipitor 80 mg daily.  Check labs.      Relevant Orders   Comp Met (CMET)   Lipid panel   Nicotine dependence, cigarettes, uncomplicated    I encouraged smoking cessation.  The patient is not ready to quit.      Vomiting    Possibly stress related.  Could be related to medication.  Discussed trying to eat something about 30 minutes after he takes his omeprazole and see if that makes a difference.  If its not making a difference we will need to have him hold his Ozempic for a couple of weeks to see if that is beneficial.      Weight loss    Likely related to a combination of reduce portion sizes as well as being on Ozempic.  We will continue to monitor.        Return in about 3 months (around 11/06/2021) for Diabetes.   Tommi Rumps, MD [place]

## 2021-08-06 NOTE — Assessment & Plan Note (Signed)
Check A1c.  He will continue Ozempic 1 mg weekly and metformin 1000 mg twice daily.  Discussed his vomiting could be related to the Ozempic and we may have to have him hold that for several weeks if it does not improve with interventions as outlined under that problem.

## 2021-08-06 NOTE — Patient Instructions (Signed)
Nice to see you. We will check lab work today and contact you with the results. Please try eating something right at 30 minutes after you take your omeprazole.  If that is not beneficial please let me know and we can have you stop your Ozempic for a couple of weeks to see if that makes a difference with your vomiting.

## 2021-08-06 NOTE — Assessment & Plan Note (Signed)
Patient will continue Vascepa 2 g twice daily, fenofibrate 145 mg daily, Zetia 10 mg daily, Repatha 140 mg every 14 days, and Lipitor 80 mg daily.  Check labs.

## 2021-08-06 NOTE — Assessment & Plan Note (Signed)
Check B12 with lab work.

## 2021-08-14 ENCOUNTER — Ambulatory Visit (INDEPENDENT_AMBULATORY_CARE_PROVIDER_SITE_OTHER): Payer: Medicare Other

## 2021-08-14 DIAGNOSIS — E538 Deficiency of other specified B group vitamins: Secondary | ICD-10-CM | POA: Diagnosis not present

## 2021-08-14 MED ORDER — CYANOCOBALAMIN 1000 MCG/ML IJ SOLN
1000.0000 ug | Freq: Once | INTRAMUSCULAR | Status: AC
  Administered 2021-08-14: 1000 ug via INTRAMUSCULAR

## 2021-08-14 NOTE — Progress Notes (Addendum)
Patient presented for B 12 injection to left deltoid, patient voiced no concerns nor showed any signs of distress during injection. 

## 2021-08-18 ENCOUNTER — Other Ambulatory Visit: Payer: Self-pay | Admitting: Family Medicine

## 2021-08-18 DIAGNOSIS — M5442 Lumbago with sciatica, left side: Secondary | ICD-10-CM

## 2021-09-08 ENCOUNTER — Other Ambulatory Visit: Payer: Self-pay | Admitting: Family

## 2021-09-08 DIAGNOSIS — M5442 Lumbago with sciatica, left side: Secondary | ICD-10-CM

## 2021-09-17 ENCOUNTER — Other Ambulatory Visit: Payer: Self-pay | Admitting: Family

## 2021-09-17 ENCOUNTER — Telehealth: Payer: Self-pay | Admitting: Family Medicine

## 2021-09-17 DIAGNOSIS — M5442 Lumbago with sciatica, left side: Secondary | ICD-10-CM

## 2021-09-17 MED ORDER — TRAMADOL HCL 50 MG PO TABS: ORAL_TABLET | ORAL | 0 refills | Status: DC

## 2021-09-17 NOTE — Telephone Encounter (Signed)
Patient is requesting a refill on his traMADol (ULTRAM) 50 MG tablet.

## 2021-09-26 ENCOUNTER — Other Ambulatory Visit: Payer: Self-pay | Admitting: Physician Assistant

## 2021-09-30 ENCOUNTER — Encounter: Payer: Self-pay | Admitting: Family Medicine

## 2021-09-30 ENCOUNTER — Ambulatory Visit (INDEPENDENT_AMBULATORY_CARE_PROVIDER_SITE_OTHER): Payer: Medicare Other | Admitting: Family Medicine

## 2021-09-30 DIAGNOSIS — G8929 Other chronic pain: Secondary | ICD-10-CM

## 2021-09-30 DIAGNOSIS — M545 Low back pain, unspecified: Secondary | ICD-10-CM | POA: Diagnosis not present

## 2021-09-30 DIAGNOSIS — R197 Diarrhea, unspecified: Secondary | ICD-10-CM | POA: Insufficient documentation

## 2021-09-30 DIAGNOSIS — E119 Type 2 diabetes mellitus without complications: Secondary | ICD-10-CM

## 2021-09-30 DIAGNOSIS — I10 Essential (primary) hypertension: Secondary | ICD-10-CM | POA: Diagnosis not present

## 2021-09-30 NOTE — Assessment & Plan Note (Signed)
Likely due to food intolerance.  Discussed avoiding foods that would contribute to this.  He will seek medical attention in person if he develops blood in the stool, worsening abdominal pain, or fevers.  If he is not improving over the next 1 to 2 days he will let us know.

## 2021-09-30 NOTE — Assessment & Plan Note (Signed)
Well-controlled. He will continue Ozempic 1 mg weekly and metformin 1000 mg twice daily.  

## 2021-09-30 NOTE — Assessment & Plan Note (Signed)
Continues to have issues with this.  He will follow-up with orthopedics.  He can continue tramadol 100 mg every 8 hours as needed for pain.

## 2021-09-30 NOTE — Assessment & Plan Note (Signed)
Well-controlled.  He will continue losartan 25 mg daily, chlorthalidone 25 mg daily and amlodipine 2.5 mg daily.

## 2021-09-30 NOTE — Progress Notes (Signed)
Virtual Visit via telephone Note  This visit type was conducted due to national recommendations for restrictions regarding the COVID-19 pandemic (e.g. social distancing).  This format is felt to be most appropriate for this patient at this time.  All issues noted in this document were discussed and addressed.  No physical exam was performed (except for noted visual exam findings with Video Visits).   I connected with Walter Salas today at 12:30 PM EDT by telephone and verified that I am speaking with the correct person using two identifiers. Location patient: home Location provider: work Persons participating in the virtual visit: patient, provider  I discussed the limitations, risks, security and privacy concerns of performing an evaluation and management service by telephone and the availability of in person appointments. I also discussed with the patient that there may be a patient responsible charge related to this service. The patient expressed understanding and agreed to proceed.  Interactive audio and video telecommunications were attempted between this provider and patient, however failed, due to patient having technical difficulties OR patient did not have access to video capability.  We continued and completed visit with audio only.   Reason for visit: f/u.  HPI: HYPERTENSION Disease Monitoring Home BP Monitoring 120s/70s Chest pain- no    Dyspnea- no Medications Compliance-  taking amlodipine, chlorthalidone, losartan.   Edema- no BMET    Component Value Date/Time   NA 134 (L) 08/06/2021 0850   NA 137 12/22/2018 0911   K 3.6 08/06/2021 0850   CL 94 (L) 08/06/2021 0850   CO2 31 08/06/2021 0850   GLUCOSE 111 (H) 08/06/2021 0850   BUN 13 08/06/2021 0850   BUN 9 12/22/2018 0911   CREATININE 0.76 08/06/2021 0850   CALCIUM 9.7 08/06/2021 0850   GFRNONAA >60 02/26/2021 1051   GFRAA >60 10/02/2019 1207   DIABETES Disease Monitoring: Blood Sugar ranges-165 this morning,  though is usually lower Polyuria/phagia/dipsia- no     Medications: Compliance- taking metformin, ozempic Hypoglycemic symptoms- no  Chronic back pain: Patient continues to have issues with this.  He is following with [facility] and notes he needs to schedule follow-up with them.  He does take tramadol and does somewhat make him drowsy though he stays home when he does take this.  Diarrhea: Patient notes this started yesterday.  Typically he will get this when he eats something that does not agree with his GI system.  Yesterday he had apples and notes that has caused this issue in the past.  He has had issues eating popcorn in the past.  No blood in his stool, nausea, vomiting, or fevers.  He has had some lower abdominal cramping that improves after a bowel movement.  He is trying to drink plenty of water and notes his urine is not dark yellow.    ROS: See pertinent positives and negatives per HPI.  Past Medical History:  Diagnosis Date   (HFpEF) heart failure with preserved ejection fraction ([place])    a. 2018 Echo: EF 60-65%, normal wall motion, Gr1DD, mild aortic valve calcification; b. 12/2017 Echo: EF 60-65%, no rwma, Ao root 4.3cm, Asc Ao 4.4 cm, mildly dil LA.   Agent orange exposure    Anginal pain ([place])    Arthritis    CAD (coronary artery disease)    a. s/p PCI/DES to the LCx and RCA in 2014; b. 05/2016 MV: EF 55-65%. Small, mild defect in basal anterolateral location->infarct w/o ischemia.   Cancer ([place])    skin  Chronic cough    Chronic pain    Colon polyp    Depression    Diabetes mellitus without complication (HCC)    Diverticulosis    GERD (gastroesophageal reflux disease)    History of kidney stones    History of stomach ulcers    History of subdural hematoma 12/14/2012   HOH (hard of hearing)    Hx of laminectomy 1999   Hyperlipidemia    Hypertension    Lacunar infarct, acute ([place])    Myocardial infarction ([place])    Sleep apnea    Subdural hematoma ([place])  12/14/2012   Syncope    a. Event monitor 2018: NSR with first-degree AV block, no significant arrhythmias or pauses; b.  Myoview showed a small in size, mild in severity fixed basal and anterior lateral defect, no ischemia, LVEF 55 to 65%, low risk study   Thoracic aortic aneurysm ([place])    a. TTE 2018: Mildly dil Ao root- 4.2 cm; b. CTA aorta 6/18: Asc Ao aneurysmal dil w max diam of 4.3 cm; b. 12/2019 CTA Chest: stable 4.3cm thor Ao Aneurysm.   Ulcer     Past Surgical History:  Procedure Laterality Date   [place] Bilateral 1990   CATARACT EXTRACTION W/PHACO Left 03/08/2018   Procedure: CATARACT EXTRACTION PHACO AND INTRAOCULAR LENS PLACEMENT ([place]) LEFT;  Surgeon: Birder Robson, MD;  Location: [facility];  Service: Ophthalmology;  Laterality: Left;  [place]  00:24.2 CDE 3.25 Fluid pack lot # [id number]   CATARACT EXTRACTION W/PHACO Right 05/24/2018   Procedure: CATARACT EXTRACTION PHACO AND INTRAOCULAR LENS PLACEMENT ([facility]) RIGHT;  Surgeon: Birder Robson, MD;  Location: [place];  Service: Ophthalmology;  Laterality: Right;  Diabetic - oral meds   CERVICAL FUSION  2000. 2003   CHOLECYSTECTOMY  2000   COLONOSCOPY WITH PROPOFOL N/A 02/23/2017   Procedure: COLONOSCOPY WITH PROPOFOL;  Surgeon: Lollie Sails, MD;  Location[facility] ENDOSCOPY;  Service: Endoscopy;  Laterality: N/A;   CORONARY ANGIOPLASTY WITH STENT PLACEMENT  2014   [facility]   ESOPHAGOGASTRODUODENOSCOPY (EGD) WITH PROPOFOL N/A 02/23/2017   Procedure: ESOPHAGOGASTRODUODENOSCOPY (EGD) WITH PROPOFOL;  Surgeon: Lollie Sails, MD;  Location: [facility] ENDOSCOPY;  Service: Endoscopy;  Laterality: N/A;   PULSE GENERATOR IMPLANT N/A 03/04/2020   Procedure: PULSE GENERATOR PLACEMENT;  Surgeon: Deetta Perla, MD;  Location: [facility] ORS;  Service: Neurosurgery;  Laterality: N/A;  1ST CASE   SHOULDER ARTHROSCOPY Right 2012   THORACIC LAMINECTOMY FOR  SPINAL CORD STIMULATOR N/A 02/26/2020   Procedure: THORACIC LAMINECTOMY FOR SPINAL CORD STIMULATOR PADDLE TRIAL;  Surgeon: Deetta Perla, MD;  Location: [facility] OR[facility];  Service: Neurosurgery;  Laterality: N/A;  1ST CASE   TUMOR EXCISION  2007    Family History  Problem Relation Age of Onset   Diabetes Mother    Heart disease Father    Diabetes Father    Diabetes Sister    Heart disease Sister    Diabetes Brother    Heart disease Brother    Heart disease Paternal Grandfather    Cancer Neg Hx     SOCIAL HX: Smoker   Current Outpatient Medications:    amLODipine (NORVASC) 2.5 MG tablet, TAKE ONE TABLET (2.5 MG) BY MOUTH EVERY DAY, Disp: 90 tablet, Rfl: 0   aspirin 81 MG EC tablet, Take 81 mg by mouth daily at 12 noon., Disp: , Rfl:    atorvastatin (LIPITOR) 80 MG  tablet, Take 80 mg by mouth daily., Disp: , Rfl:    chlorthalidone (HYGROTON) 50 MG tablet, Take 25 mg by mouth daily., Disp: , Rfl:    clopidogrel (PLAVIX) 75 MG tablet, TAKE 1 TABLET BY MOUTH DAILY., Disp: 90 tablet, Rfl: 1   DULoxetine (CYMBALTA) 20 MG capsule, duloxetine 20 mg capsule,delayed release, Disp: , Rfl:    DULoxetine 40 MG CPEP, Take 20 mg by mouth daily., Disp: 30 capsule, Rfl: 0   Evolocumab (REPATHA SURECLICK) 102 MG/ML SOAJ, Inject 140 mg into the skin every 14 (fourteen) days., Disp: 2 mL, Rfl: 1   ezetimibe (ZETIA) 10 MG tablet, Take 1 tablet (10 mg total) by mouth daily., Disp: 90 tablet, Rfl: 3   fenofibrate (TRICOR) 145 MG tablet, Take 1 tablet (145 mg total) by mouth daily., Disp: 30 tablet, Rfl: 0   glucose blood test strip, Check once daily, E11.9, Disp: 100 each, Rfl: 12   lidocaine (LIDODERM) 5 %, 1 patch daily as needed-Remove & Discard patch within 12 hours or as directed by MD, Disp: , Rfl:    losartan (COZAAR) 25 MG tablet, Take 25 mg by mouth daily., Disp: , Rfl:    metFORMIN (GLUCOPHAGE) 1000 MG tablet, Take 1,000 mg by mouth 2 (two) times daily with a meal., Disp: , Rfl:    methyl salicylate  liquid, Apply 1 application topically as needed for muscle pain., Disp: , Rfl:    metroNIDAZOLE (FLAGYL) 250 MG tablet, metronidazole 250 mg tablet, Disp: , Rfl:    nitroGLYCERIN (NITROSTAT) 0.4 MG SL tablet, Place 1 tablet (0.4 mg total) under the tongue every 5 (five) minutes as needed for chest pain. May take up to 3 doses., Disp: 30 tablet, Rfl: 0   omeprazole (PRILOSEC) 20 MG capsule, Take 20 mg by mouth daily., Disp: , Rfl:    polyvinyl alcohol (LIQUIFILM TEARS) 1.4 % ophthalmic solution, Place 1 drop into both eyes as needed for dry eyes. , Disp: , Rfl:    potassium chloride SA (K-DUR,KLOR-CON) 20 MEQ tablet, Take 1 tablet (20 mEq total) by mouth daily., Disp: 90 tablet, Rfl: 3   predniSONE (DELTASONE) 20 MG tablet, Take 2 tablets (40 mg total) by mouth daily with breakfast., Disp: 10 tablet, Rfl: 0   pregabalin (LYRICA) 50 MG capsule, Take 1 capsule (50 mg total) by mouth 3 (three) times daily., Disp: 30 capsule, Rfl: 0   pregabalin (LYRICA) 75 MG capsule, Take 75 mg by mouth 3 (three) times daily., Disp: , Rfl:    Semaglutide, 1 MG/DOSE, (OZEMPIC, 1 MG/DOSE,) 4 MG/3ML SOPN, INJECT 0.75ML ('1MG'$ ) INTO THE SKIN ONCE AWEEK, Disp: 15 mL, Rfl: 1   tamsulosin (FLOMAX) 0.4 MG CAPS capsule, TAKE 1 CAPSULE BY MOUTH ONCE DAILY, Disp: 30 capsule, Rfl: 3   traMADol (ULTRAM) 50 MG tablet, 2 tabs every 8 hours prn pain, Disp: 90 tablet, Rfl: 0   VASCEPA 1 g capsule, TAKE 2 CAPSULES BY MOUTH TWICE DAILY, Disp: 120 capsule, Rfl: 3   vitamin B-12 (CYANOCOBALAMIN) 1000 MCG tablet, Take 1 tablet (1,000 mcg total) by mouth daily., Disp: 30 tablet, Rfl: 0  EXAM: This was a telephone visit and thus no exam was completed.  ASSESSMENT AND PLAN:  Discussed the following assessment and plan:  Problem List Items Addressed This Visit     Chronic back pain (Chronic)    Continues to have issues with this.  He will follow-up with orthopedics.  He can continue tramadol 100 mg every 8 hours as needed for pain.  Diabetes mellitus, type 2 (HCC) (Chronic)    Well-controlled.  He will continue Ozempic 1 mg weekly and metformin 1000 mg twice daily.      Essential hypertension (Chronic)    Well-controlled.  He will continue losartan 25 mg daily, chlorthalidone 25 mg daily and amlodipine 2.5 mg daily.      Diarrhea    Likely due to food intolerance.  Discussed avoiding foods that would contribute to this.  He will seek medical attention in person if he develops blood in the stool, worsening abdominal pain, or fevers.  If he is not improving over the next 1 to 2 days he will let us know.       Return in about 1 week (around 10/07/2021).   I discussed the assessment and treatment plan with the patient. The patient was provided an opportunity to ask questions and all were answered. The patient agreed with the plan and demonstrated an understanding of the instructions.   The patient was advised to call back or seek an in-person evaluation if the symptoms worsen or if the condition fails to improve as anticipated.  I provided 7 minutes of non-face-to-face time during this encounter.   Tommi Rumps, MD

## 2021-10-13 ENCOUNTER — Other Ambulatory Visit: Payer: Self-pay | Admitting: Family

## 2021-10-13 DIAGNOSIS — M5442 Lumbago with sciatica, left side: Secondary | ICD-10-CM

## 2021-10-15 ENCOUNTER — Telehealth: Payer: Self-pay | Admitting: Family Medicine

## 2021-10-15 DIAGNOSIS — M5442 Lumbago with sciatica, left side: Secondary | ICD-10-CM

## 2021-10-15 NOTE — Telephone Encounter (Signed)
Patient is requesting a refill on his traMADol (ULTRAM) 50 MG tablet.

## 2021-10-17 MED ORDER — TRAMADOL HCL 50 MG PO TABS: ORAL_TABLET | ORAL | 0 refills | Status: DC

## 2021-10-17 NOTE — Addendum Note (Signed)
Addended by: Caryl Bis, Tyvion Edmondson G on: 10/17/2021 11:30 AM   Modules accepted: Orders

## 2021-10-17 NOTE — Telephone Encounter (Signed)
Sent to pharmacy 

## 2021-11-05 ENCOUNTER — Ambulatory Visit: Payer: Medicare Other | Admitting: Family Medicine

## 2021-11-07 ENCOUNTER — Ambulatory Visit: Payer: Medicare Other | Admitting: Family Medicine

## 2021-11-07 ENCOUNTER — Other Ambulatory Visit: Payer: Self-pay | Admitting: Family Medicine

## 2021-11-07 DIAGNOSIS — M5442 Lumbago with sciatica, left side: Secondary | ICD-10-CM

## 2021-11-24 DIAGNOSIS — M5412 Radiculopathy, cervical region: Secondary | ICD-10-CM | POA: Diagnosis not present

## 2021-11-26 DIAGNOSIS — M5412 Radiculopathy, cervical region: Secondary | ICD-10-CM | POA: Diagnosis not present

## 2021-11-27 DIAGNOSIS — M5412 Radiculopathy, cervical region: Secondary | ICD-10-CM | POA: Diagnosis not present

## 2021-11-28 ENCOUNTER — Ambulatory Visit (INDEPENDENT_AMBULATORY_CARE_PROVIDER_SITE_OTHER): Payer: Medicare Other | Admitting: Family Medicine

## 2021-11-28 ENCOUNTER — Encounter: Payer: Self-pay | Admitting: Family Medicine

## 2021-11-28 VITALS — BP 118/60 | HR 79 | Temp 97.9°F | Ht 70.0 in | Wt 212.8 lb

## 2021-11-28 DIAGNOSIS — I10 Essential (primary) hypertension: Secondary | ICD-10-CM

## 2021-11-28 DIAGNOSIS — G4733 Obstructive sleep apnea (adult) (pediatric): Secondary | ICD-10-CM | POA: Diagnosis not present

## 2021-11-28 DIAGNOSIS — M5137 Other intervertebral disc degeneration, lumbosacral region: Secondary | ICD-10-CM | POA: Insufficient documentation

## 2021-11-28 DIAGNOSIS — M5412 Radiculopathy, cervical region: Secondary | ICD-10-CM | POA: Diagnosis not present

## 2021-11-28 DIAGNOSIS — G8929 Other chronic pain: Secondary | ICD-10-CM

## 2021-11-28 DIAGNOSIS — Z23 Encounter for immunization: Secondary | ICD-10-CM | POA: Diagnosis not present

## 2021-11-28 DIAGNOSIS — D3500 Benign neoplasm of unspecified adrenal gland: Secondary | ICD-10-CM | POA: Insufficient documentation

## 2021-11-28 DIAGNOSIS — M545 Low back pain, unspecified: Secondary | ICD-10-CM | POA: Diagnosis not present

## 2021-11-28 DIAGNOSIS — G5770 Causalgia of unspecified lower limb: Secondary | ICD-10-CM | POA: Insufficient documentation

## 2021-11-28 DIAGNOSIS — F17210 Nicotine dependence, cigarettes, uncomplicated: Secondary | ICD-10-CM | POA: Diagnosis not present

## 2021-11-28 DIAGNOSIS — K219 Gastro-esophageal reflux disease without esophagitis: Secondary | ICD-10-CM | POA: Insufficient documentation

## 2021-11-28 DIAGNOSIS — E119 Type 2 diabetes mellitus without complications: Secondary | ICD-10-CM | POA: Diagnosis not present

## 2021-11-28 DIAGNOSIS — G4710 Hypersomnia, unspecified: Secondary | ICD-10-CM | POA: Insufficient documentation

## 2021-11-28 LAB — POCT GLYCOSYLATED HEMOGLOBIN (HGB A1C): Hemoglobin A1C: 6.6 % — AB (ref 4.0–5.6)

## 2021-11-28 NOTE — Assessment & Plan Note (Signed)
Well-controlled. He will continue Ozempic 1 mg weekly and metformin 1000 mg twice daily.

## 2021-11-28 NOTE — Progress Notes (Signed)
Patient seen along with medical student Zada Zhong.  I personally evaluated this patient along with the student, and verified all aspects of the history, physical exam, and medical decision making as documented by the student.  I agree with the student's documentation and have made all necessary edits.  Aamani Moose, MD  

## 2021-11-28 NOTE — Assessment & Plan Note (Signed)
Continues to have low back pain but well-controlled with Tramadol. Can continue to take Tramadol Q8 PRN for pain. Advised patient to watch for drowsiness and constipation.

## 2021-11-28 NOTE — Assessment & Plan Note (Signed)
Well-controlled. He will continue to take losartan 25 mg daily, chlorthalidone 50 mg daily and amlodipine 2.5 mg daily.

## 2021-11-28 NOTE — Progress Notes (Signed)
Tommi Rumps, MD Phone: [phone]  Walter Salas is a 66 y.o. male who presents today for f/u.  Hypertension: patient reports that he checks his blood pressure at home two to three times a week and they are usually in the 120s/80s. He continues to take amlodipine, chlorthalidone, and losartan daily. He denies chest pain, shortness of breath, and edema in his lower extremities.   Diabetes: patient is currently taking Ozempic and metformin. He denies any side effects from these medications. He reports that his blood sugars at home run around 150-175. He denies polyuria, polydipsia, vision changes, and new numbness/tingling in his extremities. He also denies any hypoglycemic episodes.  Low back pain: patient states that this is chronic and he will likely "have it for the rest of his life." He reports good pain control with tramadol and denies any constipation. Patient reports only taking tramadol at home and does not drive as he gets drowsy on the medication sometimes.   Smoking cessation: patient is currently not ready to quit.  Possible cervical radiculopathy: Patient saw ortho on 11/24/21 for right arm paraesthesias and numbness. He reports that ortho counseled him on trying an injection prior to surgery due to insurance reasons.   Social History   Tobacco Use  Smoking Status Every Day   Packs/day: 1.00   Years: 50.00   Total pack years: 50.00   Types: Cigarettes   Last attempt to quit: 02/15/2018   Years since quitting: 3.7  Smokeless Tobacco Never    Current Outpatient Medications on File Prior to Visit  Medication Sig Dispense Refill   amLODipine (NORVASC) 2.5 MG tablet TAKE ONE TABLET (2.5 MG) BY MOUTH EVERY DAY 90 tablet 0   aspirin 81 MG EC tablet Take 81 mg by mouth daily at 12 noon.     atorvastatin (LIPITOR) 80 MG tablet Take 80 mg by mouth daily.     chlorthalidone (HYGROTON) 50 MG tablet Take 25 mg by mouth daily.     clopidogrel (PLAVIX) 75 MG tablet TAKE 1 TABLET  BY MOUTH DAILY. 90 tablet 1   DULoxetine (CYMBALTA) 20 MG capsule duloxetine 20 mg capsule,delayed release     DULoxetine 40 MG CPEP Take 20 mg by mouth daily. 30 capsule 0   Evolocumab (REPATHA SURECLICK) 937 MG/ML SOAJ Inject 140 mg into the skin every 14 (fourteen) days. 2 mL 1   ezetimibe (ZETIA) 10 MG tablet Take 1 tablet (10 mg total) by mouth daily. 90 tablet 3   fenofibrate (TRICOR) 145 MG tablet Take 1 tablet (145 mg total) by mouth daily. 30 tablet 0   glucose blood test strip Check once daily, E11.9 100 each 12   lidocaine (LIDODERM) 5 % 1 patch daily as needed-Remove & Discard patch within 12 hours or as directed by MD     losartan (COZAAR) 25 MG tablet Take 25 mg by mouth daily.     metFORMIN (GLUCOPHAGE) 1000 MG tablet Take 1,000 mg by mouth 2 (two) times daily with a meal.     methyl salicylate liquid Apply 1 application topically as needed for muscle pain.     metroNIDAZOLE (FLAGYL) 250 MG tablet metronidazole 250 mg tablet     nitroGLYCERIN (NITROSTAT) 0.4 MG SL tablet Place 1 tablet (0.4 mg total) under the tongue every 5 (five) minutes as needed for chest pain. May take up to 3 doses. 30 tablet 0   omeprazole (PRILOSEC) 20 MG capsule Take 20 mg by mouth daily.     polyvinyl alcohol (  LIQUIFILM TEARS) 1.4 % ophthalmic solution Place 1 drop into both eyes as needed for dry eyes.      potassium chloride SA (K-DUR,KLOR-CON) 20 MEQ tablet Take 1 tablet (20 mEq total) by mouth daily. 90 tablet 3   predniSONE (DELTASONE) 20 MG tablet Take 2 tablets (40 mg total) by mouth daily with breakfast. 10 tablet 0   pregabalin (LYRICA) 50 MG capsule Take 1 capsule (50 mg total) by mouth 3 (three) times daily. 30 capsule 0   pregabalin (LYRICA) 75 MG capsule Take 75 mg by mouth 3 (three) times daily.     Semaglutide, 1 MG/DOSE, (OZEMPIC, 1 MG/DOSE,) 4 MG/3ML SOPN INJECT 0.75ML ('1MG'$ ) INTO THE SKIN ONCE AWEEK 15 mL 1   tamsulosin (FLOMAX) 0.4 MG CAPS capsule TAKE 1 CAPSULE BY MOUTH ONCE DAILY 30  capsule 3   traMADol (ULTRAM) 50 MG tablet TAKE TWO TABLETS BY MOUTH EVERY 8 HOURS AS NEEDED FOR PAIN 90 tablet 0   VASCEPA 1 g capsule TAKE 2 CAPSULES BY MOUTH TWICE DAILY 120 capsule 3   vitamin B-12 (CYANOCOBALAMIN) 1000 MCG tablet Take 1 tablet (1,000 mcg total) by mouth daily. 30 tablet 0   No current facility-administered medications on file prior to visit.     ROS see history of present illness  Objective  Vitals:   11/28/21 1120  BP: 118/60  Pulse: 79  Temp: 97.9 F (36.6 C)  SpO2: 96%    BP Readings from Last 3 Encounters:  11/28/21 118/60  08/06/21 100/60  06/30/21 124/70   Wt Readings from Last 3 Encounters:  11/28/21 212 lb 12.8 oz (96.5 kg)  09/30/21 198 lb (89.8 kg)  08/06/21 195 lb (88.5 kg)    Physical Exam Constitutional:      Appearance: Normal appearance.  HENT:     Head: Normocephalic and atraumatic.  Cardiovascular:     Rate and Rhythm: Normal rate and regular rhythm.  Pulmonary:     Effort: Pulmonary effort is normal.     Breath sounds: Normal breath sounds.  Skin:    General: Skin is warm and dry.  Neurological:     Mental Status: He is alert.  Psychiatric:        Mood and Affect: Mood normal.     Assessment/Plan: Please see individual problem list.  Problem List Items Addressed This Visit     Chronic back pain (Chronic)    Continues to have low back pain but well-controlled with Tramadol. Can continue to take Tramadol Q8 PRN for pain. Advised patient to watch for drowsiness and constipation.      Diabetes mellitus, type 2 ([place]) - Primary (Chronic)    Well-controlled. He will continue Ozempic 1 mg weekly and metformin 1000 mg twice daily.       Relevant Orders   POCT HgB A1C (Completed)   Essential hypertension (Chronic)    Well-controlled. He will continue to take losartan 25 mg daily, chlorthalidone 50 mg daily and amlodipine 2.5 mg daily.       Cervical radiculopathy    He is followed by ortho for right arm paresthesias  and numbness. Will try an injection before surgery due to insurance reasons.      Nicotine dependence, cigarettes, uncomplicated    Patient is currently not ready to quit. Encouraged patient to stop smoking.      OSA (obstructive sleep apnea)    Patient refuses to wear CPAP due to discomfort and previous sinus infection thought to be from CPAP machine. Encouraged patient  to use CPAP for better quality sleep.      Other Visit Diagnoses     Need for immunization against influenza       Relevant Orders   Flu Vaccine QUAD High Dose(Fluad) (Completed)         Return in about 3 months (around 02/28/2022).   Tommi Rumps, Medical Student [place]

## 2021-11-28 NOTE — Assessment & Plan Note (Signed)
Patient refuses to wear CPAP due to discomfort and previous sinus infection thought to be from CPAP machine. Encouraged patient to use CPAP for better quality sleep.

## 2021-11-28 NOTE — Assessment & Plan Note (Signed)
Patient is currently not ready to quit. Encouraged patient to stop smoking.

## 2021-11-28 NOTE — Assessment & Plan Note (Signed)
He is followed by ortho for right arm paresthesias and numbness. Will try an injection before surgery due to insurance reasons.

## 2021-12-03 ENCOUNTER — Other Ambulatory Visit: Payer: Self-pay | Admitting: Family Medicine

## 2021-12-03 DIAGNOSIS — M5442 Lumbago with sciatica, left side: Secondary | ICD-10-CM

## 2021-12-03 DIAGNOSIS — W108XXA Fall (on) (from) other stairs and steps, initial encounter: Secondary | ICD-10-CM | POA: Diagnosis not present

## 2021-12-03 DIAGNOSIS — S9032XA Contusion of left foot, initial encounter: Secondary | ICD-10-CM | POA: Diagnosis not present

## 2021-12-03 DIAGNOSIS — M778 Other enthesopathies, not elsewhere classified: Secondary | ICD-10-CM | POA: Diagnosis not present

## 2021-12-03 DIAGNOSIS — S99922A Unspecified injury of left foot, initial encounter: Secondary | ICD-10-CM | POA: Diagnosis not present

## 2021-12-03 DIAGNOSIS — M19072 Primary osteoarthritis, left ankle and foot: Secondary | ICD-10-CM | POA: Diagnosis not present

## 2021-12-05 NOTE — Telephone Encounter (Signed)
Okay to refill?  LOV: 11/28/21 NOV: 03/03/22

## 2021-12-09 ENCOUNTER — Telehealth: Payer: Self-pay | Admitting: Family Medicine

## 2021-12-09 NOTE — Telephone Encounter (Signed)
I received a clearance form to hold Eliquis for this patient.  This patient is not on Eliquis based on our medication list.  Please call the patient and see if he is taking Eliquis.  If he is not taking this please call [facility] in [place] at [phone] extension [phone] and speak with Crystal Crocker to see if they meant a different medication such as Plavix.  Thanks.

## 2021-12-11 DIAGNOSIS — S93602A Unspecified sprain of left foot, initial encounter: Secondary | ICD-10-CM | POA: Diagnosis not present

## 2021-12-11 DIAGNOSIS — E119 Type 2 diabetes mellitus without complications: Secondary | ICD-10-CM | POA: Diagnosis not present

## 2021-12-11 NOTE — Telephone Encounter (Signed)
Walter Salas called from [facility] about the medical Clearance.

## 2021-12-11 NOTE — Telephone Encounter (Signed)
Spoke to [place] and let her know that Dr. Caryl Bis has the Patient on Plavix not Eliquis

## 2021-12-12 ENCOUNTER — Other Ambulatory Visit: Payer: Self-pay

## 2021-12-12 ENCOUNTER — Other Ambulatory Visit: Payer: Self-pay | Admitting: Family Medicine

## 2021-12-12 ENCOUNTER — Telehealth: Payer: Self-pay | Admitting: Family Medicine

## 2021-12-12 DIAGNOSIS — M5442 Lumbago with sciatica, left side: Secondary | ICD-10-CM

## 2021-12-12 MED ORDER — TRAMADOL HCL 50 MG PO TABS
ORAL_TABLET | ORAL | 0 refills | Status: DC
Start: 2021-12-12 — End: 2022-01-30

## 2021-12-12 NOTE — Telephone Encounter (Signed)
Pt need a refill on traMADol [facility] and pt would like the cma to call him. Pt did not say what it was regarding

## 2021-12-12 NOTE — Telephone Encounter (Signed)
Sent to pharmacy 

## 2021-12-12 NOTE — Addendum Note (Signed)
Addended by: Leone Haven on: 12/12/2021 04:39 PM   Modules accepted: Orders

## 2021-12-12 NOTE — Telephone Encounter (Signed)
It looks like this medication was just ordered and sold by the Pharmacy on 12/07/21

## 2021-12-15 ENCOUNTER — Ambulatory Visit
Admission: RE | Admit: 2021-12-15 | Discharge: 2021-12-15 | Disposition: A | Discharge status: Home / Self Care | Payer: Medicare Other | Source: Ambulatory Visit | Attending: Physician Assistant | Admitting: Physician Assistant

## 2021-12-15 DIAGNOSIS — I7121 Aneurysm of the ascending aorta, without rupture: Secondary | ICD-10-CM | POA: Diagnosis not present

## 2021-12-15 DIAGNOSIS — I712 Thoracic aortic aneurysm, without rupture, unspecified: Secondary | ICD-10-CM | POA: Diagnosis not present

## 2021-12-15 LAB — POCT I-STAT CREATININE: Creatinine, Ser: 0.7 mg/dL (ref 0.61–1.24)

## 2021-12-15 MED ORDER — IOHEXOL 350 MG/ML SOLN
75.0000 mL | Freq: Once | INTRAVENOUS | Status: AC | PRN
  Administered 2021-12-15: 75 mL via INTRAVENOUS

## 2021-12-18 ENCOUNTER — Telehealth: Payer: Self-pay | Admitting: *Deleted

## 2021-12-18 DIAGNOSIS — I272 Pulmonary hypertension, unspecified: Secondary | ICD-10-CM

## 2021-12-18 DIAGNOSIS — I3139 Other pericardial effusion (noninflammatory): Secondary | ICD-10-CM

## 2021-12-18 DIAGNOSIS — I7121 Aneurysm of the ascending aorta, without rupture: Secondary | ICD-10-CM

## 2021-12-18 NOTE — Telephone Encounter (Signed)
-----   Message from Rise Mu, PA-C sent at 12/15/2021 11:48 AM EST ----- Please inform the patient the CT of his aorta showed a stable thoracic aortic aneurysm measuring 4.3 cm (aneurysm measured 4.3 cm by CTA in 12/2020), coronary artery calcification, aortic atherosclerosis, and a small amount of fluid around the heart.  Lymph nodes/nodule stable.  Stable nodule within the left adrenal gland.  Recommendations: -Continue current medical therapy with follow-up CTA of the aorta in 12/2022 -Schedule echo to evaluate pericardial effusion and for possible pulmonary hypertension -With regards to nodules and lymph nodes, findings were stable with no recommended follow-up indicated

## 2021-12-18 NOTE — Telephone Encounter (Signed)
Patient has reviewed results via My Chart. Order for echocardiogram has been entered and will have scheduling call to set that up. Order has also been sent in for the CTA of the aorta to be done in 12/2022. I will call patient with this information to see if he should have any further questions.

## 2021-12-18 NOTE — Telephone Encounter (Signed)
Reviewed results with patient and inquired if he had any questions which he replied no at this time. Order placed for Echocardiogram and will forward to schedule to assist with that. I did tell him about repeat CTA aorta in December of next year and he would like to schedule that closer to that time. He verbalized understanding of our conversation with no further questions at this time.

## 2022-01-02 ENCOUNTER — Other Ambulatory Visit: Payer: Self-pay | Admitting: Family Medicine

## 2022-01-02 DIAGNOSIS — E785 Hyperlipidemia, unspecified: Secondary | ICD-10-CM

## 2022-01-06 ENCOUNTER — Other Ambulatory Visit: Payer: Self-pay | Admitting: Family Medicine

## 2022-01-06 DIAGNOSIS — M5442 Lumbago with sciatica, left side: Secondary | ICD-10-CM

## 2022-01-13 ENCOUNTER — Other Ambulatory Visit: Payer: Self-pay | Admitting: Physician Assistant

## 2022-01-13 NOTE — Telephone Encounter (Signed)
Patient is not ready to schedule will call back

## 2022-01-13 NOTE — Telephone Encounter (Signed)
Please contact patient for overdue 9 month f/u with Dr. Saunders Revel, Medication refill pending appt.

## 2022-01-30 ENCOUNTER — Other Ambulatory Visit: Payer: Self-pay | Admitting: Family Medicine

## 2022-01-30 DIAGNOSIS — M5442 Lumbago with sciatica, left side: Secondary | ICD-10-CM

## 2022-02-03 ENCOUNTER — Telehealth: Payer: Self-pay

## 2022-02-03 NOTE — Telephone Encounter (Signed)
His patient's Icosapent cap was not covered by his insurance.  I put the explanation in the review basket.  Nia,cma

## 2022-02-05 ENCOUNTER — Ambulatory Visit (INDEPENDENT_AMBULATORY_CARE_PROVIDER_SITE_OTHER): Payer: Medicare Other

## 2022-02-05 VITALS — Ht 70.0 in | Wt 212.0 lb

## 2022-02-05 DIAGNOSIS — Z0000 Encounter for general adult medical examination without abnormal findings: Secondary | ICD-10-CM

## 2022-02-05 NOTE — Patient Instructions (Addendum)
Walter Salas , Thank you for taking time to come for your Medicare Wellness Visit. I appreciate your ongoing commitment to your health goals. Please review the following plan we discussed and let me know if I can assist you in the future.   These are the goals we discussed:  Goals       Patient Stated     Follow up with Primary Care Provider (pt-stated)      Lower A1c Take all medications as directed Healthy diet Walk more for exercise          This is a list of the screening recommended for you and due dates:  Health Maintenance  Topic Date Due   Yearly kidney health urinalysis for diabetes  06/29/2019   Eye exam for diabetics  01/26/2021   Pneumonia Vaccine (3 - PPSV23 or PCV20) 02/06/2023*   Complete foot exam   02/07/2022   Colon Cancer Screening  02/23/2022   Hemoglobin A1C  05/29/2022   Yearly kidney function blood test for diabetes  08/07/2022   Screening for Lung Cancer  12/16/2022   Medicare Annual Wellness Visit  02/06/2023   DTaP/Tdap/Td vaccine (4 - Td or Tdap) 04/29/2023   Flu Shot  Completed   Hepatitis C Screening: USPSTF Recommendation to screen - Ages 18-79 yo.  Completed   Zoster (Shingles) Vaccine  Completed   HPV Vaccine  Aged Out   COVID-19 Vaccine  Discontinued  *Topic was postponed. The date shown is not the original due date.    Conditions/risks identified: none new  Next appointment: Follow up in one year for your annual wellness visit.   Preventive Care 16 Years and Older, Male  Preventive care refers to lifestyle choices and visits with your health care provider that can promote health and wellness. What does preventive care include? A yearly physical exam. This is also called an annual well check. Dental exams once or twice a year. Routine eye exams. Ask your health care provider how often you should have your eyes checked. Personal lifestyle choices, including: Daily care of your teeth and gums. Regular physical activity. Eating a  healthy diet. Avoiding tobacco and drug use. Limiting alcohol use. Practicing safe sex. Taking low doses of aspirin every day. Taking vitamin and mineral supplements as recommended by your health care provider. What happens during an annual well check? The services and screenings done by your health care provider during your annual well check will depend on your age, overall health, lifestyle risk factors, and family history of disease. Counseling  Your health care provider may ask you questions about your: Alcohol use. Tobacco use. Drug use. Emotional well-being. Home and relationship well-being. Sexual activity. Eating habits. History of falls. Memory and ability to understand (cognition). Work and work Statistician. Screening  You may have the following tests or measurements: Height, weight, and BMI. Blood pressure. Lipid and cholesterol levels. These may be checked every 5 years, or more frequently if you are over 13 years old. Skin check. Lung cancer screening. You may have this screening every year starting at age 40 if you have a 30-pack-year history of smoking and currently smoke or have quit within the past 15 years. Fecal occult blood test (FOBT) of the stool. You may have this test every year starting at age 30. Flexible sigmoidoscopy or colonoscopy. You may have a sigmoidoscopy every 5 years or a colonoscopy every 10 years starting at age 38. Prostate cancer screening. Recommendations will vary depending on your family history and  other risks. Hepatitis C blood test. Hepatitis B blood test. Sexually transmitted disease (STD) testing. Diabetes screening. This is done by checking your blood sugar (glucose) after you have not eaten for a while (fasting). You may have this done every 1-3 years. Abdominal aortic aneurysm (AAA) screening. You may need this if you are a current or former smoker. Osteoporosis. You may be screened starting at age 16 if you are at high risk. Talk  with your health care provider about your test results, treatment options, and if necessary, the need for more tests. Vaccines  Your health care provider may recommend certain vaccines, such as: Influenza vaccine. This is recommended every year. Tetanus, diphtheria, and acellular pertussis (Tdap, Td) vaccine. You may need a Td booster every 10 years. Zoster vaccine. You may need this after age 25. Pneumococcal 13-valent conjugate (PCV13) vaccine. One dose is recommended after age 2. Pneumococcal polysaccharide (PPSV23) vaccine. One dose is recommended after age 37. Talk to your health care provider about which screenings and vaccines you need and how often you need them. This information is not intended to replace advice given to you by your health care provider. Make sure you discuss any questions you have with your health care provider. Document Released: 01/25/2015 Document Revised: 09/18/2015 Document Reviewed: 10/30/2014 Elsevier Interactive Patient Education  2017 [place] Prevention in the Home Falls can cause injuries. They can happen to people of all ages. There are many things you can do to make your home safe and to help prevent falls. What can I do on the outside of my home? Regularly fix the edges of walkways and driveways and fix any cracks. Remove anything that might make you trip as you walk through a door, such as a raised step or threshold. Trim any bushes or trees on the path to your home. Use bright outdoor lighting. Clear any walking paths of anything that might make someone trip, such as rocks or tools. Regularly check to see if handrails are loose or broken. Make sure that both sides of any steps have handrails. Any raised decks and porches should have guardrails on the edges. Have any leaves, snow, or ice cleared regularly. Use sand or salt on walking paths during winter. Clean up any spills in your garage right away. This includes oil or grease  spills. What can I do in the bathroom? Use night lights. Install grab bars by the toilet and in the tub and shower. Do not use towel bars as grab bars. Use non-skid mats or decals in the tub or shower. If you need to sit down in the shower, use a plastic, non-slip stool. Keep the floor dry. Clean up any water that spills on the floor as soon as it happens. Remove soap buildup in the tub or shower regularly. Attach bath mats securely with double-sided non-slip rug tape. Do not have throw rugs and other things on the floor that can make you trip. What can I do in the bedroom? Use night lights. Make sure that you have a light by your bed that is easy to reach. Do not use any sheets or blankets that are too big for your bed. They should not hang down onto the floor. Have a firm chair that has side arms. You can use this for support while you get dressed. Do not have throw rugs and other things on the floor that can make you trip. What can I do in the kitchen? Clean up any spills right  away. Avoid walking on wet floors. Keep items that you use a lot in easy-to-reach places. If you need to reach something above you, use a strong step stool that has a grab bar. Keep electrical cords out of the way. Do not use floor polish or wax that makes floors slippery. If you must use wax, use non-skid floor wax. Do not have throw rugs and other things on the floor that can make you trip. What can I do with my stairs? Do not leave any items on the stairs. Make sure that there are handrails on both sides of the stairs and use them. Fix handrails that are broken or loose. Make sure that handrails are as long as the stairways. Check any carpeting to make sure that it is firmly attached to the stairs. Fix any carpet that is loose or worn. Avoid having throw rugs at the top or bottom of the stairs. If you do have throw rugs, attach them to the floor with carpet tape. Make sure that you have a light switch at the  top of the stairs and the bottom of the stairs. If you do not have them, ask someone to add them for you. What else can I do to help prevent falls? Wear shoes that: Do not have high heels. Have rubber bottoms. Are comfortable and fit you well. Are closed at the toe. Do not wear sandals. If you use a stepladder: Make sure that it is fully opened. Do not climb a closed stepladder. Make sure that both sides of the stepladder are locked into place. Ask someone to hold it for you, if possible. Clearly mark and make sure that you can see: Any grab bars or handrails. First and last steps. Where the edge of each step is. Use tools that help you move around (mobility aids) if they are needed. These include: Canes. Walkers. Scooters. Crutches. Turn on the lights when you go into a dark area. Replace any light bulbs as soon as they burn out. Set up your furniture so you have a clear path. Avoid moving your furniture around. If any of your floors are uneven, fix them. If there are any pets around you, be aware of where they are. Review your medicines with your doctor. Some medicines can make you feel dizzy. This can increase your chance of falling. Ask your doctor what other things that you can do to help prevent falls. This information is not intended to replace advice given to you by your health care provider. Make sure you discuss any questions you have with your health care provider. Document Released: 10/25/2008 Document Revised: 06/06/2015 Document Reviewed: 02/02/2014 Elsevier Interactive Patient Education  2017 [employer].

## 2022-02-05 NOTE — Progress Notes (Addendum)
Subjective:   Walter Salas is a 67 y.o. male who presents for Medicare Annual/Subsequent preventive examination.  Review of Systems    No ROS.  Medicare Wellness Virtual Visit.  Visual/audio telehealth visit, UTA vital signs.   See social history for additional risk factors.   Cardiac Risk Factors include: advanced age (>38mn, >>59women);male gender;diabetes mellitus;hypertension     Objective:    Today's Vitals   02/05/22 1409  Weight: 212 lb (96.2 kg)  Height: '5\' 10"'$  (1.778 m)   Body mass index is 30.42 kg/m.     02/05/2022    2:05 PM 02/26/2021    7:17 PM 02/26/2021   10:50 AM 01/22/2021    9:55 AM 04/11/2020    9:25 AM 03/04/2020    6:33 AM 02/26/2020    6:18 AM  Advanced Directives  Does Patient Have a Medical Advance Directive? Yes Yes Yes Yes Yes No Yes  Type of AParamed[place]of [place]Living will Living will;Healthcare Power of APec[place]Living will HThree Mile BayLiving will [place]CalhounLiving will  HPulask[place]Living will  Does patient want to make changes to medical advance directive? No - Patient declined No - Patient declined  No - Patient declined   No - Patient declined  Copy of [place] Chart? No - copy requested No - copy requested  No - copy requested   No - copy requested  Would patient like information on creating a medical advance directive?      No - Patient declined     Current Medications (verified) Outpatient Encounter Medications as of 02/05/2022  Medication Sig   amLODipine (NORVASC) 2.5 MG tablet TAKE ONE TABLET (2.5 MG) BY MOUTH EVERY DAY   aspirin 81 MG EC tablet Take 81 mg by mouth daily at 12 noon.   atorvastatin (LIPITOR) 80 MG tablet Take 80 mg by mouth daily.   chlorthalidone (HYGROTON) 50 MG tablet Take 25 mg by mouth daily.   clopidogrel (PLAVIX) 75 MG tablet TAKE 1 TABLET BY MOUTH DAILY.   DULoxetine (CYMBALTA) 20  MG capsule duloxetine 20 mg capsule,delayed release   DULoxetine 40 MG CPEP Take 20 mg by mouth daily.   Evolocumab (REPATHA SURECLICK) 1355MG/ML SOAJ Inject 140 mg into the skin every 14 (fourteen) days.   ezetimibe (ZETIA) 10 MG tablet TAKE 1 TABLET BY MOUTH DAILY.   fenofibrate (TRICOR) 145 MG tablet Take 1 tablet (145 mg total) by mouth daily.   glucose blood test strip Check once daily, E11.9   lidocaine (LIDODERM) 5 % 1 patch daily as needed-Remove & Discard patch within 12 hours or as directed by MD   losartan (COZAAR) 25 MG tablet Take 25 mg by mouth daily.   metFORMIN (GLUCOPHAGE) 1000 MG tablet Take 1,000 mg by mouth 2 (two) times daily with a meal.   methyl salicylate liquid Apply 1 application topically as needed for muscle pain.   metroNIDAZOLE (FLAGYL) 250 MG tablet metronidazole 250 mg tablet   nitroGLYCERIN (NITROSTAT) 0.4 MG SL tablet Place 1 tablet (0.4 mg total) under the tongue every 5 (five) minutes as needed for chest pain. May take up to 3 doses.   omeprazole (PRILOSEC) 20 MG capsule Take 20 mg by mouth daily.   polyvinyl alcohol (LIQUIFILM TEARS) 1.4 % ophthalmic solution Place 1 drop into both eyes as needed for dry eyes.    potassium chloride SA (K-DUR,KLOR-CON) 20 MEQ tablet Take 1 tablet (  20 mEq total) by mouth daily.   predniSONE (DELTASONE) 20 MG tablet Take 2 tablets (40 mg total) by mouth daily with breakfast.   pregabalin (LYRICA) 50 MG capsule Take 1 capsule (50 mg total) by mouth 3 (three) times daily.   pregabalin (LYRICA) 75 MG capsule Take 75 mg by mouth 3 (three) times daily.   Semaglutide, 1 MG/DOSE, (OZEMPIC, 1 MG/DOSE,) 4 MG/3ML SOPN INJECT 0.75ML ('1MG'$ ) INTO THE SKIN ONCE AWEEK   tamsulosin (FLOMAX) 0.4 MG CAPS capsule TAKE 1 CAPSULE BY MOUTH ONCE DAILY   traMADol (ULTRAM) 50 MG tablet TAKE 2 TABLETS BY MOUTH EVERY 8 HOURS ASNEEDED FOR PAIN   VASCEPA 1 g capsule TAKE 2 CAPSULES BY MOUTH TWICE DAILY   vitamin B-12 (CYANOCOBALAMIN) 1000 MCG tablet Take 1  tablet (1,000 mcg total) by mouth daily.   No facility-administered encounter medications on file as of 02/05/2022.    Allergies (verified) Gabapentin, Isosorbide mononitrate er [isosorbide dinitrate], Lisinopril, and Tylenol [acetaminophen]   History: Past Medical History:  Diagnosis Date   (HFpEF) heart failure with preserved ejection fraction ([place])    a. 2018 Echo: EF 60-65%, normal wall motion, Gr1DD, mild aortic valve calcification; b. 12/2017 Echo: EF 60-65%, no rwma, Ao root 4.3cm, Asc Ao 4.4 cm, mildly dil LA.   Agent orange exposure    Anginal pain ([place])    Arthritis    CAD (coronary artery disease)    a. s/p PCI/DES to the LCx and RCA in 2014; b. 05/2016 MV: EF 55-65%. Small, mild defect in basal anterolateral location->infarct w/o ischemia.   Cancer [facility])    skin   Chronic cough    Chronic pain    Colon polyp    Depression    Diabetes mellitus without complication (HCC)    Diverticulosis    GERD (gastroesophageal reflux disease)    History of kidney stones    History of stomach ulcers    History of subdural hematoma 12/14/2012   HOH (hard of hearing)    Hx of laminectomy 1999   Hyperlipidemia    Hypertension    Lacunar infarct, acute ([place])    Myocardial infarction ([place])    Sleep apnea    Subdural hematoma ([place]) 12/14/2012   Syncope    a. Event monitor 2018: NSR with first-degree AV block, no significant arrhythmias or pauses; b.  Myoview showed a small in size, mild in severity fixed basal and anterior lateral defect, no ischemia, LVEF 55 to 65%, low risk study   Thoracic aortic aneurysm ([place])    a. TTE 2018: Mildly dil Ao root- 4.2 cm; b. CTA aorta 6/18: Asc Ao aneurysmal dil w max diam of 4.3 cm; b. 12/2019 CTA Chest: stable 4.3cm thor Ao Aneurysm.   Ulcer    Past Surgical History:  Procedure Laterality Date   [place] Bilateral 1990   CATARACT EXTRACTION W/PHACO Left 03/08/2018   Procedure: CATARACT  EXTRACTION PHACO AND INTRAOCULAR LENS PLACEMENT ([place]) LEFT;  Surgeon: Birder Robson, MD;  Location: [facility] ORS;  Service: Ophthalmology;  Laterality: Left;  [place]  00:24.2 CDE 3.25 Fluid pack lot # [id number]   CATARACT EXTRACTION W/PHACO Right 05/24/2018   Procedure: CATARACT EXTRACTION PHACO AND INTRAOCULAR LENS PLACEMENT ([facility]) RIGHT;  Surgeon: Birder Robson, MD;  Location: [place];  Service: Ophthalmology;  Laterality: Right;  Diabetic - oral meds   CERVICAL FUSION  2000. 2003   CHOLECYSTECTOMY  2000   COLONOSCOPY WITH PROPOFOL  N/A 02/23/2017   Procedure: COLONOSCOPY WITH PROPOFOL;  Surgeon: Lollie Sails, MD;  Location: [facility] ENDOSCOPY;  Service: Endoscopy;  Laterality: N/A;   CORONARY ANGIOPLASTY WITH STENT PLACEMENT  [facility]   [facility]   ESOPHAGOGASTRODUODENOSCOPY (EGD) WITH PROPOFOL N/A 02/23/2017   Procedure: ESOPHAGOGASTRODUODENOSCOPY (EGD) WITH PROPOFOL;  Surgeon: Lollie Sails, MD;  Location: [facility] ENDOSCOPY;  Service: Endoscopy;  Laterality: N/A;   PULSE GENERATOR IMPLANT N/A 03/04/2020   Procedure: PULSE GENERATOR PLACEMENT;  Surgeon: Deetta Perla, MD;  Location: [facility] ORS;  Service: Neurosurgery;  Laterality: N/A;  1ST CASE   SHOULDER ARTHROSCOPY Right 2012   THORACIC LAMINECTOMY FOR SPINAL CORD STIMULATOR N/A 02/26/2020   Procedure: THORACIC LAMINECTOMY FOR SPINAL CORD STIMULATOR PADDLE TRIAL;  Surgeon: Deetta Perla, MD;  Location: [facility] ORS;  Service: Neurosurgery;  Laterality: N/A;  1ST CASE   TUMOR EXCISION  2007   Family History  Problem Relation Age of Onset   Diabetes Mother    Heart disease Father    Diabetes Father    Diabetes Sister    Heart disease Sister    Diabetes Brother    Heart disease Brother    Heart disease Paternal Grandfather    Cancer Neg Hx    Social History   Socioeconomic History   Marital status: Married    Spouse name: Not on file   Number of children: Not on file   Years of education: Not  on file   Highest education level: Not on file  Occupational History   Not on file  Tobacco Use   Smoking status: Every Day    Packs/day: 1.00    Years: 50.00    Total pack years: 50.00    Types: Cigarettes    Last attempt to quit: 02/15/2018    Years since quitting: 3.9   Smokeless tobacco: Never  Vaping Use   Vaping Use: Never used  Substance and Sexual Activity   Alcohol use: No   Drug use: No   Sexual activity: Never  Other Topics Concern   Not on file  Social History Narrative   Lives in [place] with wife. Has cat.   Work - Database administrator, retired      Followed at [employer].      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      Diet - Regular   Exercise - starting back at gym   Social Determinants of Health   Financial Resource Strain: Low Risk  (02/05/2022)   Overall Financial Resource Strain (CARDIA)    Difficulty of Paying Living Expenses: Not hard at all  Food Insecurity: No Food Insecurity (02/05/2022)   Hunger Vital Sign    Worried About Running Out of Food in the Last Year: Never true    Ran Out of Food in the Last Year: Never true  Transportation Needs: No Transportation Needs (02/05/2022)   PRAPARE - Hydrologist (Medical): No    Lack of Transportation (Non-Medical): No  Physical Activity: Unknown (02/05/2022)   Exercise Vital Sign    Days of Exercise per Week: 0 days    Minutes of Exercise per Session: Not on file  Stress: No Stress Concern Present (02/05/2022)   [place]    Feeling of Stress : Not at all  Social Connections: Unknown (02/05/2022)   Social Connection and Isolation Panel [NHANES]    Frequency of Communication with  Friends and Family: Not on file    Frequency of Social Gatherings with Friends and Family: Not on file    Attends Religious Services: Not on file    Active Member of Clubs or Organizations: Not on file    Attends English as a second language teacher Meetings: Not on file    Marital Status: Married    Tobacco Counseling Ready to quit: Not Answered Counseling given: Not Answered   Clinical Intake:  Pre-visit preparation completed: Yes        Diabetes: Yes (Followed by PCP)  How often do you need to have someone help you when you read instructions, pamphlets, or other written materials from your doctor or pharmacy?: 1 - Never    Interpreter Needed?: No      Activities of Daily Living    02/05/2022    2:06 PM 02/26/2021    7:17 PM  In your present state of health, do you have any difficulty performing the following activities:  Hearing? 1 0  Vision? 0 0  Difficulty concentrating or making decisions? 0 0  Walking or climbing stairs? 0 1  Dressing or bathing? 0 0  Doing errands, shopping? 0 0  Preparing Food and eating ? N   Using the Toilet? N   In the past six months, have you accidently leaked urine? N   Do you have problems with loss of bowel control? N   Managing your Medications? N   Managing your Finances? N   Housekeeping or managing your Housekeeping? N     Patient Care Team: Leone Haven, MD as PCP - General (Family Medicine) End, Harrell Gave, MD as PCP - Cardiology (Cardiology) Jackolyn Confer, MD (Internal Medicine) Bary Castilla Forest Gleason, MD (General Surgery)  Indicate any recent Medical Services you may have received from other than [facility] providers in the past year (date may be approximate).     Assessment:   This is a routine wellness examination for [place].  I connected with  [name] on 02/05/22 by a audio enabled telemedicine application and verified that I am speaking with the correct person using two identifiers.  Patient Location: Home  Provider Location: Office/Clinic  I discussed the limitations of evaluation and management by telemedicine. The patient expressed understanding and agreed to proceed.   Hearing/Vision screen Hearing Screening - Comments::  Hearing aids He does not wear them often   Vision Screening - Comments:: Followed by [facility]  Wears corrective lenses when reading  Diabetic eye exam; no retinopathy  Cataracts extracted, bilateral They have regular follow up with the ophthalmologist    Dietary issues and exercise activities discussed:   Diabetic    Goals Addressed               This Visit's Progress     Patient Stated     Follow up with Primary Care Provider (pt-stated)   On track     Lower A1c Take all medications as directed Healthy diet Walk more for exercise         Depression Screen    02/05/2022    2:06 PM 09/30/2021   12:12 PM 06/30/2021    9:23 AM 02/17/2021    2:24 PM 01/22/2021   10:15 AM 07/22/2020    8:00 AM 01/22/2020    9:55 AM  PHQ 2/9 Scores  PHQ - 2 Score 0 0 0 0 0 2 0    Fall Risk    02/05/2022    4:51 PM 09/30/2021  12:12 PM 06/30/2021    9:23 AM 02/17/2021    2:23 PM 01/22/2021   10:17 AM  Fall Risk   Falls in the past year? 0 0 '1 1 1  '$ Number falls in past yr: 0 0 1 0 0  Injury with Fall? 0 0 0 0 0  Risk for fall due to :  No Fall Risks History of fall(s) History of fall(s)   Follow up Falls evaluation completed Falls evaluation completed Falls evaluation completed Falls evaluation completed Falls evaluation completed    [place]: Home free of loose throw rugs in walkways, pet beds, electrical cords, etc? Yes  Adequate lighting in your home to reduce risk of falls? Yes   ASSISTIVE DEVICES UTILIZED TO PREVENT FALLS: Life alert? No  Use of a cane, walker or w/c? No   TIMED UP AND GO: Was the test performed? No .   Cognitive Function:    04/21/2017    3:36 PM 04/20/2016    3:45 PM  MMSE - Mini Mental State Exam  Orientation to time 5 5  Orientation to Place 5 5  Registration 3 3  Attention/ Calculation 5 5  Recall 2 3  Language- name 2 objects 2 2  Language- repeat 1 1  Language- follow 3 step command 3 3   Language- read & follow direction 1 1  Write a sentence 1 1  Copy design 1 1  Total score 29 30        02/05/2022    4:52 PM 01/22/2021   10:19 AM 01/22/2020   10:00 AM 07/04/2018    3:16 PM  6CIT Screen  What Year? 0 points 0 points 0 points 0 points  What month? 0 points 0 points 0 points 0 points  What time? 0 points 0 points 0 points 0 points  Count back from 20 0 points   0 points  Months in reverse 0 points 0 points 0 points 0 points  Repeat phrase 0 points   2 points  Total Score 0 points   2 points    Immunizations Immunization History  Administered Date(s) Administered   Fluad Quad(high Dose 65+) 10/10/2019, 02/07/2021, 11/28/2021   Influenza, Seasonal, Injecte, Preservative Fre 02/07/2009, 12/02/2009, 10/14/2012, 10/19/2014   Influenza,inj,Quad PF,6+ Mos 11/14/2013, 11/15/2015, 10/26/2016, 10/18/2017, 09/12/2018   Influenza-Unspecified 11/25/1995, 11/23/1996, 10/29/1998, 11/18/1999, 12/23/2000, 12/01/2004, 01/08/2006, 11/29/2006, 12/02/2009, 12/15/2010, 10/13/2011, 09/26/2012, 10/28/2012, 11/03/2013, 10/19/2014, 09/13/2015, 10/12/2017, 10/13/2018, 02/03/2021   Pneumococcal Conjugate-13 10/10/2019   Pneumococcal Polysaccharide-23 02/07/2009, 04/28/2013   Td 06/20/1997   Td (Adult),unspecified 06/20/1997   Tdap 02/04/2011, 04/28/2013   Zoster Recombinat (Shingrix) 08/28/2018, 11/14/2018, 12/23/2018   Pneumococcal vaccine status: Due, Education has been provided regarding the importance of this vaccine. Advised may receive this vaccine at local pharmacy or Health Dept. Aware to provide a copy of the vaccination record if obtained from local pharmacy or Health Dept. Verbalized acceptance and understanding.  Screening Tests Health Maintenance  Topic Date Due   Diabetic kidney evaluation - Urine ACR  06/29/2019   OPHTHALMOLOGY EXAM  01/26/2021   Pneumonia Vaccine 70+ Years old (3 - PPSV23 or PCV20) 02/06/2023 (Originally 10/09/2020)   FOOT EXAM  02/07/2022   COLONOSCOPY  (Pts 45-36yr Insurance coverage will need to be confirmed)  02/23/2022   HEMOGLOBIN A1C  05/29/2022   Diabetic kidney evaluation - eGFR measurement  08/07/2022   Lung Cancer Screening  12/16/2022   Medicare Annual Wellness (AWV)  02/06/2023   DTaP/Tdap/Td (4 -  Td or Tdap) 04/29/2023   INFLUENZA VACCINE  Completed   Hepatitis C Screening  Completed   Zoster Vaccines- Shingrix  Completed   HPV VACCINES  Aged Out   COVID-19 Vaccine  Discontinued   Health Maintenance Health Maintenance Due  Topic Date Due   Diabetic kidney evaluation - Urine ACR  06/29/2019   OPHTHALMOLOGY EXAM  01/26/2021   DG Chest 2 View: completed 06/30/21.  Hepatitis C Screening: Completed 03/2017.  Vision Screening: Recommended annual ophthalmology exams for early detection of glaucoma and other disorders of the eye.  Dental Screening: Recommended annual dental exams for proper oral hygiene.  Community Resource Referral / Chronic Care Management: CRR required this visit?  No   CCM required this visit?  No      Plan:     I have personally reviewed and noted the following in the patient's chart:   Medical and social history Use of alcohol, tobacco or illicit drugs  Current medications and supplements including opioid prescriptions. Patient is currently taking opioid prescriptions. Information provided to patient regarding non-opioid alternatives. Patient advised to discuss non-opioid treatment plan with their provider. Followed by pcp. Functional ability and status Nutritional status Physical activity Advanced directives List of other physicians Hospitalizations, surgeries, and ER visits in previous 12 months Vitals Screenings to include cognitive, depression, and falls Referrals and appointments  In addition, I have reviewed and discussed with patient certain preventive protocols, quality metrics, and best practice recommendations. A written personalized care plan for preventive services as well as  general preventive health recommendations were provided to patient.     [place], LPN   04/20/1446    I have reviewed the above information and agree with above.   Deborra Medina, MD

## 2022-02-06 MED ORDER — ICOSAPENT ETHYL 0.5 G PO CAPS: 2.0000 g | ORAL_CAPSULE | Freq: Two times a day (BID) | ORAL | 3 refills | Status: DC

## 2022-02-06 NOTE — Addendum Note (Signed)
Addended by: Leone Haven on: 02/06/2022 09:59 AM   Modules accepted: Orders

## 2022-02-06 NOTE — Telephone Encounter (Signed)
Noted. I sent in a preferred vascepa prescription.

## 2022-02-17 ENCOUNTER — Ambulatory Visit: Payer: Medicare Other | Attending: Physician Assistant

## 2022-02-17 DIAGNOSIS — I272 Pulmonary hypertension, unspecified: Secondary | ICD-10-CM

## 2022-02-17 DIAGNOSIS — I3139 Other pericardial effusion (noninflammatory): Secondary | ICD-10-CM | POA: Diagnosis not present

## 2022-02-17 LAB — ECHOCARDIOGRAM COMPLETE
AR max vel: 3.04 cm2
AV Area VTI: 3.17 cm2
AV Area mean vel: 2.67 cm2
AV Mean grad: 3 mmHg
AV Peak grad: 5.4 mmHg
Ao pk vel: 1.16 m/s
Area-P 1/2: 3.33 cm2
S' Lateral: 2.9 cm

## 2022-02-17 MED ORDER — PERFLUTREN LIPID MICROSPHERE
1.0000 mL | INTRAVENOUS | Status: AC | PRN
  Administered 2022-02-17: 2 mL via INTRAVENOUS

## 2022-02-25 ENCOUNTER — Other Ambulatory Visit: Payer: Self-pay | Admitting: Family Medicine

## 2022-02-25 DIAGNOSIS — M5442 Lumbago with sciatica, left side: Secondary | ICD-10-CM

## 2022-03-03 ENCOUNTER — Encounter: Payer: Self-pay | Admitting: Family Medicine

## 2022-03-03 ENCOUNTER — Ambulatory Visit (INDEPENDENT_AMBULATORY_CARE_PROVIDER_SITE_OTHER): Payer: Medicare Other | Admitting: Family Medicine

## 2022-03-03 VITALS — BP 128/80 | HR 75 | Temp 97.6°F | Ht 70.0 in | Wt 223.0 lb

## 2022-03-03 DIAGNOSIS — I10 Essential (primary) hypertension: Secondary | ICD-10-CM

## 2022-03-03 DIAGNOSIS — I7121 Aneurysm of the ascending aorta, without rupture: Secondary | ICD-10-CM | POA: Diagnosis not present

## 2022-03-03 DIAGNOSIS — G8929 Other chronic pain: Secondary | ICD-10-CM | POA: Diagnosis not present

## 2022-03-03 DIAGNOSIS — E559 Vitamin D deficiency, unspecified: Secondary | ICD-10-CM

## 2022-03-03 DIAGNOSIS — Z1211 Encounter for screening for malignant neoplasm of colon: Secondary | ICD-10-CM

## 2022-03-03 DIAGNOSIS — M545 Low back pain, unspecified: Secondary | ICD-10-CM

## 2022-03-03 DIAGNOSIS — E1169 Type 2 diabetes mellitus with other specified complication: Secondary | ICD-10-CM

## 2022-03-03 DIAGNOSIS — M5412 Radiculopathy, cervical region: Secondary | ICD-10-CM | POA: Diagnosis not present

## 2022-03-03 DIAGNOSIS — E119 Type 2 diabetes mellitus without complications: Secondary | ICD-10-CM | POA: Diagnosis not present

## 2022-03-03 DIAGNOSIS — G4733 Obstructive sleep apnea (adult) (pediatric): Secondary | ICD-10-CM

## 2022-03-03 LAB — BASIC METABOLIC PANEL
BUN: 14 mg/dL (ref 6–23)
CO2: 25 mEq/L (ref 19–32)
Calcium: 9.3 mg/dL (ref 8.4–10.5)
Chloride: 100 mEq/L (ref 96–112)
Creatinine, Ser: 0.72 mg/dL (ref 0.40–1.50)
GFR: 94 mL/min (ref >60.00)
Glucose, Bld: 160 mg/dL — ABNORMAL HIGH (ref 70–99)
Potassium: 3.7 mEq/L (ref 3.5–5.1)
Sodium: 136 mEq/L (ref 135–145)

## 2022-03-03 LAB — POCT GLYCOSYLATED HEMOGLOBIN (HGB A1C): Hemoglobin A1C: 7.1 % — AB (ref 4.0–5.6)

## 2022-03-03 LAB — VITAMIN D 25 HYDROXY (VIT D DEFICIENCY, FRACTURES): VITD: 24.94 ng/mL — ABNORMAL LOW (ref 30.00–100.00)

## 2022-03-03 MED ORDER — SEMAGLUTIDE (2 MG/DOSE) 8 MG/3ML ~~LOC~~ SOPN: 2.0000 mg | PEN_INJECTOR | SUBCUTANEOUS | 3 refills | Status: DC

## 2022-03-03 NOTE — Assessment & Plan Note (Signed)
Chronic issue.  Stable on most recent imaging.  Advised that if he ever develops chest pain he needs to be evaluated immediately.

## 2022-03-03 NOTE — Assessment & Plan Note (Signed)
Chronic issue.  A1c slightly above goal.  We will increase his Ozempic to 2 mg weekly and continue metformin 1000 mg twice daily.

## 2022-03-03 NOTE — Patient Instructions (Addendum)
Nice to see you. You are due for colonoscopy.  GI will call you to schedule this. We will increase your Ozempic to 2 mg weekly.  If you notice any side effects with this please let us know. Please call to schedule your eye exam.

## 2022-03-03 NOTE — Assessment & Plan Note (Signed)
Chronic issue.  He will continue tramadol 100 mg every 8 hours as needed for pain.  He will monitor his drowsiness and if it becomes excessive he will discontinue this and let us know.

## 2022-03-03 NOTE — Progress Notes (Signed)
Tommi Rumps, MD Phone: [phone]  Walter Salas is a 67 y.o. male who presents today for f/u.  DIABETES Disease Monitoring: Blood Sugar ranges-150 Polyuria/phagia/dipsia- no      Optho- due Medications: Compliance- taking ozempic, metformin Hypoglycemic symptoms- no Has been less active related to a nerve impingement issue in his neck.   Cervical radiculopathy: Patient reports he had an MRI last week and is supposed to hear something on the plan for this this week.  Chronic back pain: Patient takes tramadol for this.  Notes it works well.  It does make him drowsy so he does not drive when he takes it.  Thoracic aortic aneurysm: Patient had follow-up CT in December.  It was stable.  There is no chest pain or shortness of breath.   Social History   Tobacco Use  Smoking Status Every Day   Packs/day: 1.00   Years: 50.00   Total pack years: 50.00   Types: Cigarettes   Last attempt to quit: 02/15/2018   Years since quitting: 4.0  Smokeless Tobacco Never    Current Outpatient Medications on File Prior to Visit  Medication Sig Dispense Refill   amLODipine (NORVASC) 2.5 MG tablet TAKE ONE TABLET (2.5 MG) BY MOUTH EVERY DAY 90 tablet 0   aspirin 81 MG EC tablet Take 81 mg by mouth daily at 12 noon.     atorvastatin (LIPITOR) 80 MG tablet Take 80 mg by mouth daily.     chlorthalidone (HYGROTON) 50 MG tablet Take 25 mg by mouth daily.     clopidogrel (PLAVIX) 75 MG tablet TAKE 1 TABLET BY MOUTH DAILY. 90 tablet 1   DULoxetine (CYMBALTA) 20 MG capsule duloxetine 20 mg capsule,delayed release     DULoxetine 40 MG CPEP Take 20 mg by mouth daily. 30 capsule 0   ezetimibe (ZETIA) 10 MG tablet TAKE 1 TABLET BY MOUTH DAILY. 90 tablet 3   fenofibrate (TRICOR) 145 MG tablet Take 1 tablet (145 mg total) by mouth daily. 30 tablet 0   glucose blood test strip Check once daily, E11.9 100 each 12   Icosapent Ethyl (VASCEPA) 0.5 g CAPS Take 4 capsules (2 g total) by mouth 2 (two) times  daily. 720 capsule 3   lidocaine (LIDODERM) 5 % 1 patch daily as needed-Remove & Discard patch within 12 hours or as directed by MD     losartan (COZAAR) 25 MG tablet Take 25 mg by mouth daily.     metFORMIN (GLUCOPHAGE) 1000 MG tablet Take 1,000 mg by mouth 2 (two) times daily with a meal.     methyl salicylate liquid Apply 1 application topically as needed for muscle pain.     metroNIDAZOLE (FLAGYL) 250 MG tablet metronidazole 250 mg tablet     nitroGLYCERIN (NITROSTAT) 0.4 MG SL tablet Place 1 tablet (0.4 mg total) under the tongue every 5 (five) minutes as needed for chest pain. May take up to 3 doses. 30 tablet 0   omeprazole (PRILOSEC) 20 MG capsule Take 20 mg by mouth daily.     polyvinyl alcohol (LIQUIFILM TEARS) 1.4 % ophthalmic solution Place 1 drop into both eyes as needed for dry eyes.      potassium chloride SA (K-DUR,KLOR-CON) 20 MEQ tablet Take 1 tablet (20 mEq total) by mouth daily. 90 tablet 3   predniSONE (DELTASONE) 20 MG tablet Take 2 tablets (40 mg total) by mouth daily with breakfast. 10 tablet 0   pregabalin (LYRICA) 50 MG capsule Take 1 capsule (50 mg total)  by mouth 3 (three) times daily. 30 capsule 0   pregabalin (LYRICA) 75 MG capsule Take 75 mg by mouth 3 (three) times daily.     tamsulosin (FLOMAX) 0.4 MG CAPS capsule TAKE 1 CAPSULE BY MOUTH ONCE DAILY 30 capsule 3   traMADol (ULTRAM) 50 MG tablet TAKE 2 TABLETS BY MOUTH EVERY 8 HOURS ASNEEDED FOR PAIN 90 tablet 0   vitamin B-12 (CYANOCOBALAMIN) 1000 MCG tablet Take 1 tablet (1,000 mcg total) by mouth daily. 30 tablet 0   Evolocumab (REPATHA SURECLICK) [id number] MG/ML SOAJ Inject 140 mg into the skin every 14 (fourteen) days. (Patient not taking: Reported on 03/03/2022) 2 mL 1   No current facility-administered medications on file prior to visit.     ROS see history of present illness  Objective  Physical Exam Vitals:   03/03/22 0836 03/03/22 0849  BP: 136/76 128/80  Pulse: 75   Temp: 97.6 F (36.4 C)   SpO2:  99%     BP Readings from Last 3 Encounters:  03/03/22 128/80  11/28/21 118/60  08/06/21 100/60   Wt Readings from Last 3 Encounters:  03/03/22 223 lb (101.2 kg)  02/05/22 212 lb (96.2 kg)  11/28/21 212 lb 12.8 oz (96.5 kg)    Physical Exam Constitutional:      General: He is not in acute distress.    Appearance: He is not diaphoretic.  Cardiovascular:     Rate and Rhythm: Normal rate and regular rhythm.     Heart sounds: Normal heart sounds.  Pulmonary:     Effort: Pulmonary effort is normal.     Breath sounds: Normal breath sounds.  Skin:    General: Skin is warm and dry.  Neurological:     Mental Status: He is alert.      Assessment/Plan: Please see individual problem list.  Type 2 diabetes mellitus without complication, without long-term current use of insulin ([place]) Assessment & Plan: Chronic issue.  A1c slightly above goal.  We will increase his Ozempic to 2 mg weekly and continue metformin 1000 mg twice daily.  Orders: -     POCT glycosylated hemoglobin (Hb A1C) -     Semaglutide (2 MG/DOSE); Inject 2 mg as directed once a week.  Dispense: 9 mL; Refill: 3  Essential hypertension -     Basic metabolic panel  Aneurysm of ascending aorta without rupture (HCC) Assessment & Plan: Chronic issue.  Stable on most recent imaging.  Advised that if he ever develops chest pain he needs to be evaluated immediately.   Chronic low back pain, unspecified back pain laterality, unspecified whether sciatica present Assessment & Plan: Chronic issue.  He will continue tramadol 100 mg every 8 hours as needed for pain.  He will monitor his drowsiness and if it becomes excessive he will discontinue this and let us know.   Cervical radiculopathy Assessment & Plan: He will continue to follow with orthopedics.   Colon cancer screening -     Ambulatory referral to Gastroenterology  Vitamin D deficiency Assessment & Plan: Recheck today.  Orders: -     VITAMIN D 25 Hydroxy  (Vit-D Deficiency, Fractures)     Health Maintenance: patient will call to schedule his yearly eye exam. Gi referral for colonoscopy.   Return in about 3 months (around 06/01/2022) for chronic pain, DM, HTN.   Tommi Rumps, MD [place]

## 2022-03-03 NOTE — Assessment & Plan Note (Signed)
Recheck today. 

## 2022-03-03 NOTE — Assessment & Plan Note (Signed)
He will continue to follow with orthopedics.

## 2022-03-24 ENCOUNTER — Other Ambulatory Visit: Payer: Self-pay | Admitting: Family Medicine

## 2022-03-24 DIAGNOSIS — M5442 Lumbago with sciatica, left side: Secondary | ICD-10-CM

## 2022-04-08 ENCOUNTER — Emergency Department: Payer: Medicare Other

## 2022-04-08 ENCOUNTER — Other Ambulatory Visit: Payer: Self-pay

## 2022-04-08 ENCOUNTER — Emergency Department
Admission: EM | Admit: 2022-04-08 | Discharge: 2022-04-08 | Disposition: A | Discharge status: Other Acute Inpatient Hospital | Payer: Medicare Other | Attending: Emergency Medicine | Admitting: Emergency Medicine

## 2022-04-08 DIAGNOSIS — E8770 Fluid overload, unspecified: Secondary | ICD-10-CM | POA: Diagnosis not present

## 2022-04-08 DIAGNOSIS — Z1152 Encounter for screening for COVID-19: Secondary | ICD-10-CM | POA: Insufficient documentation

## 2022-04-08 DIAGNOSIS — I110 Hypertensive heart disease with heart failure: Secondary | ICD-10-CM | POA: Insufficient documentation

## 2022-04-08 DIAGNOSIS — Z8673 Personal history of transient ischemic attack (TIA), and cerebral infarction without residual deficits: Secondary | ICD-10-CM | POA: Insufficient documentation

## 2022-04-08 DIAGNOSIS — R778 Other specified abnormalities of plasma proteins: Secondary | ICD-10-CM | POA: Diagnosis not present

## 2022-04-08 DIAGNOSIS — J811 Chronic pulmonary edema: Secondary | ICD-10-CM | POA: Diagnosis not present

## 2022-04-08 DIAGNOSIS — R0602 Shortness of breath: Secondary | ICD-10-CM | POA: Diagnosis not present

## 2022-04-08 DIAGNOSIS — E8779 Other fluid overload: Secondary | ICD-10-CM | POA: Insufficient documentation

## 2022-04-08 DIAGNOSIS — I4891 Unspecified atrial fibrillation: Secondary | ICD-10-CM | POA: Insufficient documentation

## 2022-04-08 DIAGNOSIS — J90 Pleural effusion, not elsewhere classified: Secondary | ICD-10-CM | POA: Diagnosis not present

## 2022-04-08 DIAGNOSIS — E119 Type 2 diabetes mellitus without complications: Secondary | ICD-10-CM | POA: Insufficient documentation

## 2022-04-08 DIAGNOSIS — I509 Heart failure, unspecified: Secondary | ICD-10-CM | POA: Diagnosis not present

## 2022-04-08 DIAGNOSIS — M7989 Other specified soft tissue disorders: Secondary | ICD-10-CM | POA: Insufficient documentation

## 2022-04-08 DIAGNOSIS — R0789 Other chest pain: Secondary | ICD-10-CM | POA: Diagnosis not present

## 2022-04-08 LAB — BASIC METABOLIC PANEL
Anion gap: 9 (ref 5–15)
BUN: 14 mg/dL (ref 8–23)
CO2: 25 mmol/L (ref 22–32)
Calcium: 9.1 mg/dL (ref 8.9–10.3)
Chloride: 102 mmol/L (ref 98–111)
Creatinine, Ser: 0.78 mg/dL (ref 0.61–1.24)
GFR, Estimated: >60 mL/min (ref >60)
Glucose, Bld: 162 mg/dL — ABNORMAL HIGH (ref 70–99)
Potassium: 3.5 mmol/L (ref 3.5–5.1)
Sodium: 136 mmol/L (ref 135–145)

## 2022-04-08 LAB — BRAIN NATRIURETIC PEPTIDE: B Natriuretic Peptide: 241 pg/mL — ABNORMAL HIGH (ref 0.0–100.0)

## 2022-04-08 LAB — CBC
HCT: 40.9 % (ref 39.0–52.0)
Hemoglobin: 13 g/dL (ref 13.0–17.0)
MCH: 27.2 pg (ref 26.0–34.0)
MCHC: 31.8 g/dL (ref 30.0–36.0)
MCV: 85.6 fL (ref 80.0–100.0)
Platelets: 217 10*3/uL (ref 150–400)
RBC: 4.78 MIL/uL (ref 4.22–5.81)
RDW: 13.7 % (ref 11.5–15.5)
WBC: 4.7 10*3/uL (ref 4.0–10.5)
nRBC: 0 % (ref 0.0–0.2)

## 2022-04-08 LAB — RESP PANEL BY RT-PCR (FLU A&B, COVID) ARPGX2
Influenza A by PCR: NEGATIVE
Influenza B by PCR: NEGATIVE
SARS Coronavirus 2 by RT PCR: NEGATIVE

## 2022-04-08 LAB — TROPONIN I (HIGH SENSITIVITY)
Troponin I (High Sensitivity): 27 ng/L — ABNORMAL HIGH (ref <18)
Troponin I (High Sensitivity): 28 ng/L — ABNORMAL HIGH (ref <18)

## 2022-04-08 MED ORDER — ASPIRIN 81 MG PO CHEW
324.0000 mg | CHEWABLE_TABLET | Freq: Once | ORAL | Status: AC
  Administered 2022-04-08: 324 mg via ORAL
  Filled 2022-04-08: qty 4

## 2022-04-08 MED ORDER — IBUPROFEN 800 MG PO TABS: 800.0000 mg | ORAL_TABLET | Freq: Once | ORAL | Status: DC

## 2022-04-08 MED ORDER — POTASSIUM CHLORIDE CRYS ER 20 MEQ PO TBCR
40.0000 meq | EXTENDED_RELEASE_TABLET | Freq: Once | ORAL | Status: AC
  Administered 2022-04-08: 40 meq via ORAL
  Filled 2022-04-08: qty 2

## 2022-04-08 MED ORDER — FUROSEMIDE 10 MG/ML IJ SOLN
60.0000 mg | Freq: Once | INTRAMUSCULAR | Status: AC
  Administered 2022-04-08: 60 mg via INTRAVENOUS
  Filled 2022-04-08: qty 8

## 2022-04-08 NOTE — ED Provider Notes (Signed)
Spoke with [facility] transfer coordinator.  Telemetry bed being requested.  They advised that they will have one of their team members/physician call me back to get additional detail.  I explained to them that might ED physician note will be done until about 5 PM today or perhaps around that time.  Do not typically complete notes prior to disposition.  [facility] transfer request completed.  Discussed with patient he is understanding, this is based on his insurance/coverage.  He is medically stable for transfer and ongoing care on a telemetry unit.  I have also discussed with cardiology here, Dr. Fletcher Anon, and after having discussed the patient's clinical history and presentation patient will require cardiology consultation either here or if admitted at the [facility] there, and recommends diuresis which has been ordered.   Delman Kitten, MD 04/08/22 [phone]

## 2022-04-08 NOTE — ED Triage Notes (Addendum)
Pt reports chest pain and shortness of breath for the last several days. Pt states 7/10 pressure to the chest. Pt has a cardiac history with 4 stents, not on blood thinners. Pt states the shortness of breath gets worse when laying down. Pt states 3 weeks ago he was diagnosed with fluid around the heart as well.

## 2022-04-08 NOTE — ED Notes (Signed)
EMTALA reviewed by this RN.  

## 2022-04-08 NOTE — ED Notes (Signed)
Pt transferred to [facility]. Pt information given to [facility].

## 2022-04-08 NOTE — ED Notes (Signed)
ALL  PAPERWORK  FAXED  TO  [facility] 

## 2022-04-08 NOTE — ED Notes (Signed)
Attempted report x1 to [facility] floor 7A.

## 2022-04-08 NOTE — ED Provider Notes (Signed)
Patient resting comfortably.  Has diuresed over a liter now.  Reports he is starting to feel better shortness of breath is slowly improving.  He is resting quite comfortably understanding and agreeable with plan for ambulance transport to the [facility] in [place] once bed assigned.   Delman Kitten, MD 04/08/22 1450

## 2022-04-08 NOTE — ED Provider Notes (Signed)
Accepted (pending bed availability) to [facility] telemetry bed by Dr. Pennie Rushing, Belva Bertin.  Case, history, imaging, and labs and treatments thus far discussed.    Delman Kitten, MD 04/08/22 [phone]

## 2022-04-08 NOTE — ED Provider Notes (Signed)
[facility] Provider Note    Event Date/Time   First MD Initiated Contact with Patient 04/08/22 [phone]     (approximate)   History   Chest Pain   HPI  Walter Salas is a 68 y.o. male remote history of subdural, Agent Orange exposure coronary disease heart failure with preserved EF   Patient has noted history in the chart of diabetes essential hypertension coronary disease thoracic aneurysm, prior stroke. Follows with Christell Faith (cardiology)  Patient advises the [place] has not sent his chlorthalidone he has not taken it for over a week.  He is not having chest pain but has a slight sense of chest tightness along with the shortness of breath that is developed over the last 3 days  Shortness of breath increasing for the last 3 days.  Also associated with increased leg swelling  Physical Exam   Triage Vital Signs: ED Triage Vitals [04/08/22 0917]  Enc Vitals Group     BP (!) 165/92     Pulse Rate 81     Resp 18     Temp 97.9 F (36.6 C)     Temp src      SpO2 94 %     Weight 220 lb (99.8 kg)     Height 5\' 10"  (1.778 m)     Head Circumference      Peak Flow      Pain Score 7     Pain Loc      Pain Edu?      Excl. in [place]?     Most recent vital signs: Vitals:   04/08/22 1100 04/08/22 1300  BP: (!) 165/106 (!) 169/83  Pulse: 66 63  Resp: 15 18  Temp:  98 F (36.7 C)  SpO2: 96% 94%     General: Awake, no distress.  Somewhat thick neck, mild JVD present CV:  Good peripheral perfusion.  Normal rate and irregular Resp:  Normal effort.  Slightly manage lung sounds the bases bilaterally.  Normal work of breathing.  Resting upright without acute distress. Abd:  No distention.  Soft nontender Other:  Moderate equal bilateral lower extremity peripheral edema   ED Results / Procedures / Treatments   Labs (all labs ordered are listed, but only abnormal results are displayed) Labs Reviewed  BASIC METABOLIC PANEL - Abnormal; Notable for the  following components:      Result Value   Glucose, Bld 162 (*)    All other components within normal limits  BRAIN NATRIURETIC PEPTIDE - Abnormal; Notable for the following components:   B Natriuretic Peptide 241.0 (*)    All other components within normal limits  TROPONIN I (HIGH SENSITIVITY) - Abnormal; Notable for the following components:   Troponin I (High Sensitivity) 27 (*)    All other components within normal limits  TROPONIN I (HIGH SENSITIVITY) - Abnormal; Notable for the following components:   Troponin I (High Sensitivity) 28 (*)    All other components within normal limits  RESP PANEL BY RT-PCR (FLU A&B, COVID) [id number]  CBC     EKG  Interpreted by me at 925 heart rate 70 QRS 90 QTc 450 Atrial fibrillation.  No evidence of acute ischemia.  Rate controlled EKG appears similar in morphology to April 14, 2021 EKG but today appears to have atrial fibrillation  RADIOLOGY  Chest x-ray interpreted by me as bilateral small pleural effusions and mild vascular congestion  DG Chest 2 View  Result Date: 04/08/2022 CLINICAL  DATA:  Chest pain and shortness of breath for last several days, orthopnea, chest pressure rated at 7/10, history of cardiac stenting EXAM: CHEST - 2 VIEW COMPARISON:  06/30/2021 FINDINGS: Intraspinal stimulator unchanged. Enlargement of cardiac silhouette with pulmonary vascular congestion. Mediastinal contours normal. Accentuation of interstitial markings in the mid to lower lungs, may represent pulmonary edema or atypical infection. Trace pleural effusions bilaterally. No pneumothorax or acute osseous findings. IMPRESSION: Enlargement of cardiac silhouette with pulmonary vascular congestion. Trace pleural effusions with accentuated interstitial markings versus previous exam question mild pulmonary edema less likely atypical infection. Electronically Signed   By: Lavonia Dana M.D.   On: 04/08/2022 09:47        PROCEDURES:  Critical Care performed:  No  Procedures   MEDICATIONS ORDERED IN ED: Medications  furosemide (LASIX) injection 60 mg (60 mg Intravenous Given 04/08/22 1221)  aspirin chewable tablet 324 mg (324 mg Oral Given 04/08/22 1303)  potassium chloride SA (KLOR-CON M) CR tablet 40 mEq (40 mEq Oral Given 04/08/22 1303)     IMPRESSION / MDM / ASSESSMENT AND PLAN / ED COURSE  I reviewed the triage vital signs and the nursing notes.                              Differential diagnosis includes, but is not limited to, congestive heart failure, volume overload, new onset atrial fibrillation, less likely ACS.  No clinical signs or symptoms highly suggestive of pulmonary embolism.  He denies any infectious symptoms as normal white count and there is no clear clinical history suggest pneumonia or infection.  Care discussed with cardiology advising recommendation for diuresis and cardiology consultation to follow whether that is here at the [facility] is unclear at this time.  Patient is a English as a second language teacher, requesting paperwork sent for admission [facility]  Patient's presentation is most consistent with acute complicated illness / injury requiring diagnostic workup.   The patient is on the cardiac monitor to evaluate for evidence of arrhythmia and/or significant heart rate changes.  Labs reviewed notable for minimally elevated troponin likely mild demand.  Elevated BNP.  Normal metabolic panel and CBC.  ----------------------------------------- 3:36 PM on 04/08/2022 ----------------------------------------- EMTALA completed.  He is resting comfortably at this time patient accepted to [facility].  Patient understanding agreeable with plan for transfer.  He will be able to be seen by cardiology at [facility]  Dr. Pennie Rushing, Belva Bertin Accepting MD  Vitals:   04/08/22 1100 04/08/22 1300  BP: (!) 165/106 (!) 169/83  Pulse: 66 63  Resp: 15 18  Temp:  98 F (36.7 C)  SpO2: 96% 94%         FINAL CLINICAL  IMPRESSION(S) / ED DIAGNOSES   Final diagnoses:  Atrial fibrillation, new onset (HCC)  Volume overload state of heart     Rx / DC Orders   ED Discharge Orders     None        Note:  This document was prepared using Dragon voice recognition software and may include unintentional dictation errors.   Ongoing care assigned to Dr. Archie Balboa.  Patient currently awaiting transfer to [facility], MD 04/08/22 1537

## 2022-04-09 ENCOUNTER — Telehealth: Payer: Self-pay | Admitting: Internal Medicine

## 2022-04-09 NOTE — Telephone Encounter (Signed)
Left voicemail message to call back and to have someone send [place] fax requesting records.

## 2022-04-09 NOTE — Telephone Encounter (Signed)
Spoke with provider and reviewed patients chart and answered questions that he had. He requested records to include testing, office visit notes, and monitor results. All questions were answered and paperwork has been faxed.

## 2022-04-09 NOTE — Telephone Encounter (Signed)
Dr. Tally Joe with the [facility] calling to speak with someone to verify the patient's care and request records. Phone: [phone]

## 2022-04-10 LAB — LAB REPORT - SCANNED
A1c: 6.5
EGFR: 82

## 2022-04-13 ENCOUNTER — Telehealth: Payer: Self-pay

## 2022-04-13 NOTE — Telephone Encounter (Signed)
     Patient  visit on 3/27  at [facility]    Have you been able to follow up with your primary care physician? Yes   The patient was or was not able to obtain any needed medicine or equipment. Na   Are there diet recommendations that you are having difficulty following? Na   Patient expresses understanding of discharge instructions and education provided has no other needs at this time.  Yes     [place] [phone] [address], [place], [place] [address] Phone: [phone] Email: [email or web address]

## 2022-04-14 NOTE — Progress Notes (Signed)
Cardiology Office Note:    Date:  04/15/2022   ID:  Walter Salas, DOB 08/17/1955, MRN [id number]  PCP:  Walter Haven, MD   [place] Providers Cardiologist:  Walter Bush, MD     Referring MD: Walter Haven, MD   CC: follow up after recent hospitalization  History of Present Illness:    Walter Salas is a 67 y.o. male with a hx of HTN, CAD (PCI with DES to Lcx and RCA @ [place] 2014), TAA, OSA intolerant to CPAP, GERD, DM2, HLD, CVA 2023, Agent Orange exposure, HFpEF, mild aortic root dilatation, tobacco use, fall with ICH.   Previously had DES to Lcx and RCA in 2014 at [place].   He established care with Dr. Saunders Salas in 2018 after several syncopal episodes. He had a myocardial perfusion study which was overall low risk with a small defect of mild severity and suggestive of infarct without ischemia. Echo revealed EF 60-65%, grade I DD, AV with mildly calcified leaflets, aortic root was mildly dilated at 42 mm, ascending aorta was mildly dilated at 38 mm. 30 day event monitor revealed predominantly SR with 1st degree AV block, no significant arrhythmias or pauses.   Echo on 02/17/22 revealed EF 55-60%, mild asymmetric LVH of the basal-septal segment, grade II DD, LA mildly dilated, borderline aortic root dilatation 19mm, mild dilatation of the ascending aorta 40 mm.  Admitted 02/2021 with lacunar infarct to left thalamus, CTA head and neck showed mild > mod atherosclerosis without large vessel occlusion.   Most recently evaluated by Walter Faith PA on 04/14/21 for pre-op evaluation. He was doing well from a cardiac perspective. Long term monitor revealed SR, 2 episodes of NSVT, occasional PAC's and rare PVC's, no sustained arrhythmia or pauses.   Presented to [facility] ED on 04/08/22 after several days of worsening SOB, orthopnea, and chest pain. He had been out of his chlorthalidone x 7. He was found to be in new onset AF, volume overloaded. BNP was 241, Troponin 27 > 28, CXR showed mild  pulmonary edema. Echo showed an EF of 55%, moderate concentric LVH, grade I DD, trivial MR, AV mildly thickened. He was transferred to [place] for continued care. He underwent TEE/DCCV x 1 @ 200j with conversion to sinus bradycardia ~ 50 bpm. He was started on Eliquis 5 mg BID and was ultimately discharged in NSR on 04/10/22. Labs at d/c: Cr 1.0, mag 2.2, K 3.4, TSH 1.13, Free T4 1.26  He presents today accompanied his wife for follow-up after his recent hospitalization as outlined above.  He states he has felt terrible since Sunday, and is noted to be back in atrial fibrillation today.  He has been compliant with his Eliquis, denies hematuria, hematochezia or hemoptysis.  He is unaware that he is in atrial fibrillation, denies palpitations.  He endorses fatigue and states this has been most bothersome for him over the last couple of days. He has been weighing himself daily, and has not noticed any sudden weight gain. He was 218 on the day he went to the hospital and was 208 today.  He denies chest pain, palpitations, dyspnea, pnd, orthopnea, n, v, dizziness, syncope, edema, weight gain, or early satiety.   Past Medical History:  Diagnosis Date   (HFpEF) heart failure with preserved ejection fraction    a. 2018 Echo: EF 60-65%, normal wall motion, Gr1DD, mild aortic valve calcification; b. 12/2017 Echo: EF 60-65%, no rwma, Ao root 4.3cm, Asc Ao 4.4  cm, mildly dil LA.   Agent orange exposure    Anginal pain    Arthritis    CAD (coronary artery disease)    a. s/p PCI/DES to the LCx and RCA in 2014; b. 05/2016 MV: EF 55-65%. Small, mild defect in basal anterolateral location->infarct w/o ischemia.   Cancer    skin   Chronic cough    Chronic pain    Colon polyp    Depression    Diabetes mellitus without complication    Diverticulosis    GERD (gastroesophageal reflux disease)    History of kidney stones    History of stomach ulcers    History of subdural hematoma 12/14/2012   HOH (hard of  hearing)    Hx of laminectomy 1999   Hyperlipidemia    Hypertension    Lacunar infarct, acute    Myocardial infarction    Sleep apnea    Subdural hematoma 12/14/2012   Syncope    a. Event monitor 2018: NSR with first-degree AV block, no significant arrhythmias or pauses; b.  Myoview showed a small in size, mild in severity fixed basal and anterior lateral defect, no ischemia, LVEF 55 to 65%, low risk study   Thoracic aortic aneurysm    a. TTE 2018: Mildly dil Ao root- 4.2 cm; b. CTA aorta 6/18: Asc Ao aneurysmal dil w max diam of 4.3 cm; b. 12/2019 CTA Chest: stable 4.3cm thor Ao Aneurysm.   Ulcer     Past Surgical History:  Procedure Laterality Date   [place] Bilateral 1990   CATARACT EXTRACTION W/PHACO Left 03/08/2018   Procedure: CATARACT EXTRACTION PHACO AND INTRAOCULAR LENS PLACEMENT ([place]) LEFT;  Surgeon: Birder Robson, MD;  Location: [facility] ORS;  Service: Ophthalmology;  Laterality: Left;  [place]  00:24.2 CDE 3.25 Fluid pack lot # [id number]   CATARACT EXTRACTION W/PHACO Right 05/24/2018   Procedure: CATARACT EXTRACTION PHACO AND INTRAOCULAR LENS PLACEMENT (IOC) RIGHT;  Surgeon: Birder Robson, MD;  Location: [place];  Service: Ophthalmology;  Laterality: Right;  Diabetic - oral meds   CERVICAL FUSION  2000. 2003   CHOLECYSTECTOMY  2000   COLONOSCOPY WITH PROPOFOL N/A 02/23/2017   Procedure: COLONOSCOPY WITH PROPOFOL;  Surgeon: Lollie Sails, MD;  Location[facility] END[facility]COPY;  Service: Endoscopy;  Laterality: N/A;   CORONARY ANGIOPLASTY WITH STENT PLACEMENT  2014   [facility]   ESOPHAGOGASTRODUODENOSCOPY (EGD) WITH PROPOFOL N/A 02/23/2017   Procedure: ESOPHAGOGASTRODUODENOSCOPY (EGD) WITH PROPOFOL;  Surgeon: Lollie Sails, MD;  Location: [facility] ENDOSCOPY;  Service: Endoscopy;  Laterality: N/A;   PULSE GENERATOR IMPLANT N/A 03/04/2020   Procedure: PULSE GENERATOR PLACEMENT;   Surgeon: Deetta Perla, MD;  Location: [facility] ORS;  Service: Neurosurgery;  Laterality: N/A;  1ST CASE   SHOULDER ARTHROSCOPY Right 2012   THORACIC LAMINECTOMY FOR SPINAL CORD STIMULATOR N/A 02/26/2020   Procedure: THORACIC LAMINECTOMY FOR SPINAL CORD STIMULATOR PADDLE TRIAL;  Surgeon: Deetta Perla, MD;  Location: [facility] ORS;  Service: Neurosurgery;  Laterality: N/A;  1ST CASE   TUMOR EXCISION  2007    Current Medications: Current Meds  Medication Sig   amiodarone (PACERONE) 200 MG tablet Take 2 tablets by mouth twice a day for 2 weeks then reduce to 1 tablet by mouth twice a day   amLODipine (NORVASC) 2.5 MG tablet TAKE ONE TABLET (2.5 MG) BY MOUTH EVERY DAY   apixaban (ELIQUIS) 5 MG TABS tablet Take 5 mg  by mouth 2 (two) times daily.   aspirin 81 MG EC tablet Take 81 mg by mouth daily at 12 noon.   atorvastatin (LIPITOR) 80 MG tablet Take 80 mg by mouth daily.   chlorthalidone (HYGROTON) 50 MG tablet Take 25 mg by mouth daily.   clopidogrel (PLAVIX) 75 MG tablet TAKE 1 TABLET BY MOUTH DAILY.   DULoxetine 40 MG CPEP Take 20 mg by mouth daily.   ezetimibe (ZETIA) 10 MG tablet TAKE 1 TABLET BY MOUTH DAILY.   fenofibrate (TRICOR) 145 MG tablet Take 1 tablet (145 mg total) by mouth daily.   glucose blood test strip Check once daily, E11.9   Icosapent Ethyl (VASCEPA) 0.5 g CAPS Take 4 capsules (2 g total) by mouth 2 (two) times daily.   lidocaine (LIDODERM) 5 % 1 patch daily as needed-Remove & Discard patch within 12 hours or as directed by MD   losartan (COZAAR) 25 MG tablet Take 25 mg by mouth daily.   metFORMIN (GLUCOPHAGE) 1000 MG tablet Take 1,000 mg by mouth 2 (two) times daily with a meal.   methyl salicylate liquid Apply 1 application topically as needed for muscle pain.   metroNIDAZOLE (FLAGYL) 250 MG tablet metronidazole 250 mg tablet   nitroGLYCERIN (NITROSTAT) 0.4 MG SL tablet Place 1 tablet (0.4 mg total) under the tongue every 5 (five) minutes as needed for chest pain. May take up to 3  doses.   omeprazole (PRILOSEC) 20 MG capsule Take 20 mg by mouth daily.   polyvinyl alcohol (LIQUIFILM TEARS) 1.4 % ophthalmic solution Place 1 drop into both eyes as needed for dry eyes.    potassium chloride SA (K-DUR,KLOR-CON) 20 MEQ tablet Take 1 tablet (20 mEq total) by mouth daily.   predniSONE (DELTASONE) 20 MG tablet Take 2 tablets (40 mg total) by mouth daily with breakfast.   pregabalin (LYRICA) 75 MG capsule Take 75 mg by mouth 3 (three) times daily.   Semaglutide, 2 MG/DOSE, 8 MG/3ML SOPN Inject 2 mg as directed once a week.   tamsulosin (FLOMAX) 0.4 MG CAPS capsule TAKE 1 CAPSULE BY MOUTH ONCE DAILY   traMADol (ULTRAM) 50 MG tablet TAKE 2 TABLETS BY MOUTH EVERY 8 HOURS ASNEEDED FOR PAIN   vitamin B-12 (CYANOCOBALAMIN) 1000 MCG tablet Take 1 tablet (1,000 mcg total) by mouth daily.     Allergies:   Gabapentin, Isosorbide mononitrate er [isosorbide dinitrate], Lisinopril, and Tylenol [acetaminophen]   Social History   Socioeconomic History   Marital status: Married    Spouse name: Not on file   Number of children: Not on file   Years of education: Not on file   Highest education level: Not on file  Occupational History   Not on file  Tobacco Use   Smoking status: Every Day    Packs/day: 1.00    Years: 50.00    Additional pack years: 0.00    Total pack years: 50.00    Types: Cigarettes    Last attempt to quit: 02/15/2018    Years since quitting: 4.1   Smokeless tobacco: Never  Vaping Use   Vaping Use: Never used  Substance and Sexual Activity   Alcohol use: No   Drug use: No   Sexual activity: Never  Other Topics Concern   Not on file  Social History Narrative   Lives in [place] with wife. Has cat.   Work - Database administrator, retired      Followed at [employer].      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      Diet - Regular   Exercise - starting back at gym   Social Determinants of Health   Financial Resource Strain: Low Risk   (02/05/2022)   Overall Financial Resource Strain (CARDIA)    Difficulty of Paying Living Expenses: Not hard at all  Food Insecurity: No Food Insecurity (02/05/2022)   Hunger Vital Sign    Worried About Running Out of Food in the Last Year: Never true    Ran Out of Food in the Last Year: Never true  Transportation Needs: No Transportation Needs (02/05/2022)   PRAPARE - Hydrologist (Medical): No    Lack of Transportation (Non-Medical): No  Physical Activity: Unknown (02/05/2022)   Exercise Vital Sign    Days of Exercise per Week: 0 days    Minutes of Exercise per Session: Not on file  Stress: No Stress Concern Present (02/05/2022)   [place]    Feeling of Stress : Not at all  Social Connections: Unknown (02/05/2022)   Social Connection and Isolation Panel [NHANES]    Frequency of Communication with Friends and Family: Not on file    Frequency of Social Gatherings with Friends and Family: Not on file    Attends Religious Services: Not on file    Active Member of Clubs or Organizations: Not on file    Attends Archivist Meetings: Not on file    Marital Status: Married     Family History: The patient's family history includes Diabetes in his brother, father, mother, and sister; Heart disease in his brother, father, paternal grandfather, and sister. There is no history of Cancer.  ROS:   Please see the history of present illness.    All other systems reviewed and are negative.  EKGs/Labs/Other Studies Reviewed:    The following studies were reviewed today:  Cardiac Studies & Procedures     STRESS TESTS  NM MYOCAR MULTI W/SPECT W 05/15/2016  Narrative  There was no ST segment deviation noted during stress.  This is a low risk study.  The left ventricular ejection fraction is normal (55-65%).  Defect 1: There is a small defect of mild severity present in the basal  anterolateral location. This defect is vey small and suggestive of prior infarct without ischemia.   ECHOCARDIOGRAM  ECHOCARDIOGRAM COMPLETE 02/17/2022  Narrative ECHOCARDIOGRAM REPORT    Patient Name:   Walter Salas Date of Exam: 02/17/2022 Medical Rec #:  [id number]      Height:       70.0 in Accession #:    [id number]     Weight:       212.0 lb Date of Birth:  1955-10-15     BSA:          2.140 m Patient Age:    34 years       BP:           120/68 mmHg Patient Gender: M              HR:           68 bpm. Exam Location:  [place]  Procedure: 2D Echo, Cardiac Doppler, Color Doppler and Intracardiac Opacification Agent  Indications:    I27.20 Pulmonary Hypertension  History:        Patient has prior history of Echocardiogram examinations, most recent 02/27/2021. Dilated aorta, CAD and Previous Myocardial Infarction, Stroke and Pulmonary HTN;  Risk Factors:Hypertension, Diabetes, Dyslipidemia, Current Smoker and Sleep Apnea.  Sonographer:    Pilar Jarvis RDMS, RVT, RDCS Referring Phys: [id number] [place]   1. Left ventricular ejection fraction, by estimation, is 55 to 60%. The left ventricle has normal function. The left ventricle has no regional wall motion abnormalities. There is mild asymmetric left ventricular hypertrophy of the basal-septal segment. Left ventricular diastolic parameters are consistent with Grade II diastolic dysfunction (pseudonormalization). 2. Right ventricular systolic function is normal. The right ventricular size is normal. Tricuspid regurgitation signal is inadequate for assessing PA pressure. 3. Left atrial size was mildly dilated. 4. The mitral valve is normal in structure. No evidence of mitral valve regurgitation. No evidence of mitral stenosis. 5. The aortic valve is normal in structure. Aortic valve regurgitation is not visualized. No aortic stenosis is present. 6. There is borderline dilatation of the aortic root, measuring 39 mm.  There is mild dilatation of the ascending aorta, measuring 40 mm. 7. The inferior vena cava is normal in size with greater than 50% respiratory variability, suggesting right atrial pressure of 3 mmHg.  FINDINGS Left Ventricle: Left ventricular ejection fraction, by estimation, is 55 to 60%. The left ventricle has normal function. The left ventricle has no regional wall motion abnormalities. Definity contrast agent was given IV to delineate the left ventricular endocardial borders. The left ventricular internal cavity size was normal in size. There is mild asymmetric left ventricular hypertrophy of the basal-septal segment. Left ventricular diastolic parameters are consistent with Grade II diastolic dysfunction (pseudonormalization).  Right Ventricle: The right ventricular size is normal. No increase in right ventricular wall thickness. Right ventricular systolic function is normal. Tricuspid regurgitation signal is inadequate for assessing PA pressure.  Left Atrium: Left atrial size was mildly dilated.  Right Atrium: Right atrial size was normal in size.  Pericardium: There is no evidence of pericardial effusion.  Mitral Valve: The mitral valve is normal in structure. No evidence of mitral valve regurgitation. No evidence of mitral valve stenosis.  Tricuspid Valve: The tricuspid valve is normal in structure. Tricuspid valve regurgitation is not demonstrated. No evidence of tricuspid stenosis.  Aortic Valve: The aortic valve is normal in structure. Aortic valve regurgitation is not visualized. No aortic stenosis is present. Aortic valve mean gradient measures 3.0 mmHg. Aortic valve peak gradient measures 5.4 mmHg. Aortic valve area, by VTI measures 3.17 cm.  Pulmonic Valve: The pulmonic valve was normal in structure. Pulmonic valve regurgitation is not visualized. No evidence of pulmonic stenosis.  Aorta: The aortic root is normal in size and structure. There is borderline dilatation of the  aortic root, measuring 39 mm. There is mild dilatation of the ascending aorta, measuring 40 mm.  Venous: The inferior vena cava is normal in size with greater than 50% respiratory variability, suggesting right atrial pressure of 3 mmHg.  IAS/Shunts: No atrial level shunt detected by color flow Doppler.   LEFT VENTRICLE PLAX 2D LVIDd:         4.40 cm   Diastology LVIDs:         2.90 cm   LV e' medial:    6.42 cm/s LV PW:         1.50 cm   LV E/e' medial:  17.1 LV IVS:        1.60 cm   LV e' lateral:   6.96 cm/s LVOT diam:     2.30 cm   LV E/e' lateral: 15.8 LV SV:  72 LV SV Index:   34 LVOT Area:     4.15 cm   RIGHT VENTRICLE             IVC RV S prime:     16.10 cm/s  IVC diam: 1.70 cm TAPSE (M-mode): 2.4 cm  LEFT ATRIUM             Index LA diam:        4.50 cm 2.10 cm/m LA Vol (A2C):   72.4 ml 33.84 ml/m LA Vol (A4C):   74.7 ml 34.91 ml/m LA Biplane Vol: 76.6 ml 35.80 ml/m AORTIC VALVE                    PULMONIC VALVE AV Area (Vmax):    3.04 cm     PV Vmax:       1.07 m/s AV Area (Vmean):   2.67 cm     PV Peak grad:  4.6 mmHg AV Area (VTI):     3.17 cm AV Vmax:           116.00 cm/s AV Vmean:          82.300 cm/s AV VTI:            0.228 m AV Peak Grad:      5.4 mmHg AV Mean Grad:      3.0 mmHg LVOT Vmax:         84.90 cm/s LVOT Vmean:        52.800 cm/s LVOT VTI:          0.174 m LVOT/AV VTI ratio: 0.76  AORTA Ao Root diam: 3.85 cm Ao Asc diam:  4.00 cm  MITRAL VALVE MV Area (PHT): 3.33 cm     SHUNTS MV Decel Time: 228 msec     Systemic VTI:  0.17 m MV E velocity: 110.00 cm/s  Systemic Diam: 2.30 cm MV A velocity: 90.00 cm/s MV E/A ratio:  1.22  Ida Rogue MD Electronically signed by Ida Rogue MD Signature Date/Time: 02/17/2022/1:44:29 PM    Final    MONITORS  LONG TERM MONITOR-LIVE TELEMETRY (3-14 DAYS) 06/24/2021  Narrative  The patient was monitored for 14 days.  The predominant rhythm was sinus with an average rate of  79 bpm (range 53-129 bpm in sinus).  There were occasional PACs and rare PVCs.  Two episodes of nonsustained ventricular tachycardia occurred, lasting up to 18 beats with a maximum rate of 207 bpm.  No sustained arrhythmia or prolonged pause was observed.  Patient triggered events corresponded to sinus rhythm and PACs.  Predominantly sinus rhythm with occasional PACs and rare PVCs.  Two episodes of NSVT lasting up to 18 beats also noted.            EKG:  EKG is  ordered today.  The ekg ordered today demonstrates AF, HR 83 bpm.   Recent Labs: 08/06/2021: ALT 15 04/08/2022: B Natriuretic Peptide 241.0; BUN 14; Creatinine, Ser 0.78; Hemoglobin 13.0; Platelets 217; Potassium 3.5; Sodium 136  Recent Lipid Panel    Component Value Date/Time   CHOL 99 08/06/2021 0850   TRIG 113.0 08/06/2021 0850   HDL 35.50 (L) 08/06/2021 0850   CHOLHDL 3 08/06/2021 0850   VLDL 22.6 08/06/2021 0850   LDLCALC 41 08/06/2021 0850   LDLDIRECT 90.0 06/30/2021 0955     Risk Assessment/Calculations:    CHA2DS2-VASc Score = 7   This indicates a 11.2% annual risk of stroke. The patient's score is based upon:  CHF History: 1 HTN History: 1 Diabetes History: 1 Stroke History: 2 Vascular Disease History: 1 Age Score: 1 Gender Score: 0                Physical Exam:    VS:  BP 130/60   Pulse 83   Ht 5\' 10"  (1.778 m)   Wt 214 lb 3.2 oz (97.2 kg)   SpO2 96%   BMI 30.73 kg/m     Wt Readings from Last 3 Encounters:  04/15/22 214 lb 3.2 oz (97.2 kg)  04/08/22 220 lb (99.8 kg)  03/03/22 223 lb (101.2 kg)     GEN:  Well nourished, well developed in no acute distress HEENT: Normal NECK: No JVD; No carotid bruits LYMPHATICS: No lymphadenopathy CARDIAC: irregular rhythm noted, no murmurs, rubs, gallops RESPIRATORY:  Clear to auscultation without rales, wheezing or rhonchi  ABDOMEN: Soft, non-tender, non-distended MUSCULOSKELETAL:  No edema; No deformity  SKIN: Warm and dry NEUROLOGIC:  Alert  and oriented x 3 PSYCHIATRIC:  Normal affect   ASSESSMENT:    1. Atrial fibrillation, unspecified type   2. Cerebrovascular accident (CVA), unspecified mechanism   3. Coronary artery disease involving native coronary artery of native heart without angina pectoris   4. Aneurysm of ascending aorta without rupture   5. Hyperlipidemia LDL goal <70   6. Heart failure with preserved ejection fraction, unspecified HF chronicity    PLAN:    In order of problems listed above:  Atrial fibrillation - new onset surrounding recent admission. EKG shows AF, rate controlled at 83 bpm.  CHA2DS2-VASc Score = 7 [CHF History: 1, HTN History: 1, Diabetes History: 1, Stroke History: 2, Vascular Disease History: 1, Age Score: 1, Gender Score: 0].  Therefore, the patient's annual risk of stroke is 11.2 %.  Continue Eliquis 5 mg twice daily (no indication for dose reduction). Patient is very eager to under DCCV again as he is feeling very fatigued and "miserable".  Discussed with DOD Dr. Curt Bears, will start amiodarone 400 mg twice daily x two weeks, then 200 mg twice daily thereafter (LFTs were normal per records from [place]). Previous TEE/DCCV was without thrombus on 04/10/22, and initial DCCV was successful. Arrange DCCV for 04/23/22.  He is instructed to hold his Ozempic injection until after his DCCV.   HFpEF - echo on 04/09/22 at [facility] showed EF 55%, grade I DD, moderate concentric LVH. NYHA class I today. BNP during admission was 241. Euvolemic. Weighing daily. On GDMT of Losartan, on Jardiance (DM dosing). Continue chlorthalidone. Consider adding beta blocker therapy or MRA once DCCV completed.   CAD - PCI with DES to Lcx and RCA @ [place] 2014; Stable with no anginal symptoms. No indication for ischemic evaluation.  Continue ASA, Vascepa, Zetia, Repatha.   Aortic dilatation - most recent CTA showed ascending aorta measuring 37mm. Continue Norvasc, losartan.   HTN -  BP today 130/60, continue current antihypertensive  medication regimen.   HLD - most recetly LDL well controlled at 41, continue Repatha, Zetia, and Vascepa.   Disposition -  Hold Ozempic until after DCCV (last injection on 04/07/22) start amiodarone 400 mg twice daily x 2 weeks > amiodarone 200 mg twice daily. DCCV on 04/23/22. Follow up with Dr. Curt Bears (scheduled for 06/01/22).   Shared Decision Making/Informed Consent The risks (stroke, cardiac arrhythmias rarely resulting in the need for a temporary or permanent pacemaker, skin irritation or burns and complications associated with conscious sedation including aspiration, arrhythmia, respiratory failure and death), benefits (restoration  of normal sinus rhythm) and alternatives of a direct current cardioversion were explained in detail to Walter Salas and he agrees to proceed.      Medication Adjustments/Labs and Tests Ordered: Current medicines are reviewed at length with the patient today.  Concerns regarding medicines are outlined above.  Orders Placed This Encounter  Procedures   Basic metabolic panel   CBC   Ambulatory referral to Cardiac Electrophysiology   EKG 12-Lead   Meds ordered this encounter  Medications   amiodarone (PACERONE) 200 MG tablet    Sig: Take 2 tablets by mouth twice a day for 2 weeks then reduce to 1 tablet by mouth twice a day    Dispense:  290 tablet    Refill:  3    Patient Instructions  Medication Instructions:  Your physician has recommended you make the following change in your medication:   START Amiodarone 200 mg taking 2 tablets twice a day for 14 days then reduce to 1 tablet twice a day  *If you need a refill on your cardiac medications before your next appointment, please call your pharmacy*   Lab Work: TODAY:  BMET & CBC  If you have labs (blood work) drawn today and your tests are completely normal, you will receive your results only by: [place] (if you have MyChart) OR A paper copy in the mail If you have any lab test that is  abnormal or we need to change your treatment, we will call you to review the results.   Testing/Procedures: Your physician has recommended that you have a Cardioversion (DCCV). Electrical Cardioversion uses a jolt of electricity to your heart either through paddles or wired patches attached to your chest. This is a controlled, usually prescheduled, procedure. Defibrillation is done under light anesthesia in the hospital, and you usually go home the day of the procedure. This is done to get your heart back into a normal rhythm. You are not awake for the procedure. Please see the instruction sheet given to you BELOW:       Dear Walter Salas  You are scheduled for a Cardioversion on Thursday, April 11 with Dr. Gasper Sells.  Please arrive at the [facility] (Main Entrance A) at [facility]: [address] [place], [place] [address] at 7:30 AM.   DIET:  Nothing to eat or drink after midnight except a sip of water with medications (see medication instructions below)  MEDICATION INSTRUCTIONS: HOLD: Semaglutide (Ozempic, Rybelsus, Wegovy) for 7 days I WOULD NOT TAKE THE SHOT UNTIL AFTER THE PROCEDURE HOLD METFORMIN THE MORNING OF YOUR PROCEDURE Continue taking your anticoagulant (blood thinner): Apixaban (Eliquis).  You will need to continue this after your procedure until you are told by your provider that it is safe to stop.    LABS: WILL BE DONE TODAY   FYI:  For your safety, and to allow [place] to monitor your vital signs accurately during the surgery/procedure we request: If you have artificial nails, gel coating, SNS etc, please have those removed prior to your surgery/procedure. Not having the nail coverings /polish removed may result in cancellation or delay of your surgery/procedure.  You must have a responsible person to drive you home and stay in the waiting area during your procedure. Failure to do so could result in cancellation.  Bring your insurance cards.  *Special  Note: Every effort is made to have your procedure done on time. Occasionally there are emergencies that occur at the hospital that may cause delays.  Please be patient if a delay does occur.      Follow-Up: At [facility], you and your health needs are our priority.  As part of our continuing mission to provide you with exceptional heart care, we have created designated Provider Care Teams.  These Care Teams include your primary Cardiologist (physician) and Advanced Practice Providers (APPs -  Physician Assistants and Nurse Practitioners) who all work together to provide you with the care you need, when you need it.  We recommend signing up for the patient portal called "MyChart".  Sign up information is provided on this After Visit Summary.  MyChart is used to connect with patients for Virtual Visits (Telemedicine).  Patients are able to view lab/test results, encounter notes, upcoming appointments, etc.  Non-urgent messages can be sent to your provider as well.   To learn more about what you can do with MyChart, go to [email or web address].    Your next appointment:   1ST AVAILABLE WITH DR. Curt Bears  Provider:   Allegra Lai, MD    Other Instructions     Signed, Trudi Ida, NP  04/15/2022 12:53 PM    [facility] [place]

## 2022-04-15 ENCOUNTER — Ambulatory Visit: Payer: Medicare Other | Attending: Physician Assistant | Admitting: Cardiology

## 2022-04-15 ENCOUNTER — Encounter: Payer: Self-pay | Admitting: Physician Assistant

## 2022-04-15 DIAGNOSIS — I7121 Aneurysm of the ascending aorta, without rupture: Secondary | ICD-10-CM | POA: Diagnosis not present

## 2022-04-15 DIAGNOSIS — I4891 Unspecified atrial fibrillation: Secondary | ICD-10-CM | POA: Diagnosis not present

## 2022-04-15 DIAGNOSIS — I2510 Atherosclerotic heart disease of native coronary artery without angina pectoris: Secondary | ICD-10-CM | POA: Diagnosis not present

## 2022-04-15 DIAGNOSIS — I639 Cerebral infarction, unspecified: Secondary | ICD-10-CM | POA: Diagnosis not present

## 2022-04-15 DIAGNOSIS — E785 Hyperlipidemia, unspecified: Secondary | ICD-10-CM

## 2022-04-15 DIAGNOSIS — I5030 Unspecified diastolic (congestive) heart failure: Secondary | ICD-10-CM

## 2022-04-15 DIAGNOSIS — I10 Essential (primary) hypertension: Secondary | ICD-10-CM | POA: Diagnosis not present

## 2022-04-15 MED ORDER — AMIODARONE HCL 200 MG PO TABS: ORAL_TABLET | ORAL | 3 refills | Status: DC

## 2022-04-15 NOTE — Patient Instructions (Signed)
Medication Instructions:  Your physician has recommended you make the following change in your medication:   START Amiodarone 200 mg taking 2 tablets twice a day for 14 days then reduce to 1 tablet twice a day  *If you need a refill on your cardiac medications before your next appointment, please call your pharmacy*   Lab Work: TODAY:  BMET & CBC  If you have labs (blood work) drawn today and your tests are completely normal, you will receive your results only by: [place] (if you have MyChart) OR A paper copy in the mail If you have any lab test that is abnormal or we need to change your treatment, we will call you to review the results.   Testing/Procedures: Your physician has recommended that you have a Cardioversion (DCCV). Electrical Cardioversion uses a jolt of electricity to your heart either through paddles or wired patches attached to your chest. This is a controlled, usually prescheduled, procedure. Defibrillation is done under light anesthesia in the hospital, and you usually go home the day of the procedure. This is done to get your heart back into a normal rhythm. You are not awake for the procedure. Please see the instruction sheet given to you BELOW:       Dear Walter Salas  You are scheduled for a Cardioversion on Thursday, April 11 with Dr. Gasper Sells.  Please arrive at the [facility] (Main Entrance A) at [place]: [address] [place], [place] [address] at 7:30 AM.   DIET:  Nothing to eat or drink after midnight except a sip of water with medications (see medication instructions below)  MEDICATION INSTRUCTIONS: HOLD: Semaglutide (Ozempic, Rybelsus, Wegovy) for 7 days I WOULD NOT TAKE THE SHOT UNTIL AFTER THE PROCEDURE HOLD METFORMIN THE MORNING OF YOUR PROCEDURE Continue taking your anticoagulant (blood thinner): Apixaban (Eliquis).  You will need to continue this after your procedure until you are told by your provider that it is safe to  stop.    LABS: WILL BE DONE TODAY   FYI:  For your safety, and to allow [place] to monitor your vital signs accurately during the surgery/procedure we request: If you have artificial nails, gel coating, SNS etc, please have those removed prior to your surgery/procedure. Not having the nail coverings /polish removed may result in cancellation or delay of your surgery/procedure.  You must have a responsible person to drive you home and stay in the waiting area during your procedure. Failure to do so could result in cancellation.  Bring your insurance cards.  *Special Note: Every effort is made to have your procedure done on time. Occasionally there are emergencies that occur at the hospital that may cause delays. Please be patient if a delay does occur.      Follow-Up: At [facility], you and your health needs are our priority.  As part of our continuing mission to provide you with exceptional heart care, we have created designated Provider Care Teams.  These Care Teams include your primary Cardiologist (physician) and Advanced Practice Providers (APPs -  Physician Assistants and Nurse Practitioners) who all work together to provide you with the care you need, when you need it.  We recommend signing up for the patient portal called "MyChart".  Sign up information is provided on this After Visit Summary.  MyChart is used to connect with patients for Virtual Visits (Telemedicine).  Patients are able to view lab/test results, encounter notes, upcoming appointments, etc.  Non-urgent messages can be  sent to your provider as well.   To learn more about what you can do with MyChart, go to [email or web address].    Your next appointment:   1ST AVAILABLE WITH DR. Curt Bears  Provider:   Allegra Lai, MD    Other Instructions

## 2022-04-16 LAB — CBC
Hematocrit: 46.6 % (ref 37.5–51.0)
Hemoglobin: 15.4 g/dL (ref 13.0–17.7)
MCH: 27.4 pg (ref 26.6–33.0)
MCHC: 33 g/dL (ref 31.5–35.7)
MCV: 83 fL (ref 79–97)
Platelets: 268 x10E3/uL (ref 150–450)
RBC: 5.62 x10E6/uL (ref 4.14–5.80)
RDW: 13.4 % (ref 11.6–15.4)
WBC: 6.7 x10E3/uL (ref 3.4–10.8)

## 2022-04-16 LAB — BASIC METABOLIC PANEL WITH GFR
BUN/Creatinine Ratio: 24 (ref 10–24)
BUN: 23 mg/dL (ref 8–27)
CO2: 20 mmol/L (ref 20–29)
Calcium: 9.8 mg/dL (ref 8.6–10.2)
Chloride: 100 mmol/L (ref 96–106)
Creatinine, Ser: 0.95 mg/dL (ref 0.76–1.27)
Glucose: 151 mg/dL — ABNORMAL HIGH (ref 70–99)
Potassium: 4.3 mmol/L (ref 3.5–5.2)
Sodium: 139 mmol/L (ref 134–144)
eGFR: 87 mL/min/1.73

## 2022-04-20 ENCOUNTER — Encounter: Payer: Self-pay | Admitting: Internal Medicine

## 2022-04-20 ENCOUNTER — Other Ambulatory Visit: Payer: Self-pay | Admitting: Family Medicine

## 2022-04-20 ENCOUNTER — Other Ambulatory Visit: Payer: Self-pay | Admitting: Physician Assistant

## 2022-04-20 DIAGNOSIS — M5442 Lumbago with sciatica, left side: Secondary | ICD-10-CM

## 2022-04-20 NOTE — Telephone Encounter (Signed)
Please schedule overdue 9 month F/U for refills. Thank you!

## 2022-04-20 NOTE — Telephone Encounter (Signed)
LOV: 03/03/2022   NOV: 06/03/22

## 2022-04-21 NOTE — Telephone Encounter (Signed)
Patient was seen 04/15/22 by Wallis Bamberg

## 2022-04-22 ENCOUNTER — Telehealth: Payer: Self-pay | Admitting: Internal Medicine

## 2022-04-22 NOTE — Pre-Procedure Instructions (Signed)
Spoke with patient regarding procedure instructions for cardioversion tomorrow.    Arrival time 7:30. Nothing to eat or drink after midnight. You need a responsible adult to drive you home and stay with you for 24 hours.  Patient takes Eliquis- takes twice a day, hasn't missed any doses, take dose in the morning.  Last Ozempic dose for 2 weeks ago.

## 2022-04-22 NOTE — Telephone Encounter (Signed)
Patient states he received a call telling him to give [place] a call.

## 2022-04-22 NOTE — Telephone Encounter (Signed)
Pt made aware of lab results and verbalized understanding.   Flossie Dibble, NP 04/16/2022  4:28 PM EDT     Mr. Sthilaire, Your blood work showed stable kidney function, no signs of anemia. Your glucose was elevated, but not worrisome. Best, Victorino Dike

## 2022-04-23 ENCOUNTER — Ambulatory Visit (HOSPITAL_COMMUNITY)
Admission: RE | Admit: 2022-04-23 | Discharge: 2022-04-23 | Disposition: A | Discharge status: Home / Self Care | Payer: Medicare Other | Attending: Internal Medicine | Admitting: Internal Medicine

## 2022-04-23 ENCOUNTER — Ambulatory Visit (HOSPITAL_COMMUNITY): Payer: Medicare Other | Admitting: Anesthesiology

## 2022-04-23 ENCOUNTER — Encounter (HOSPITAL_COMMUNITY)
Admission: RE | Disposition: A | Discharge status: Home / Self Care | Payer: Self-pay | Source: Home / Self Care | Attending: Internal Medicine

## 2022-04-23 ENCOUNTER — Telehealth: Payer: Self-pay | Admitting: *Deleted

## 2022-04-23 DIAGNOSIS — I480 Paroxysmal atrial fibrillation: Secondary | ICD-10-CM

## 2022-04-23 DIAGNOSIS — I4891 Unspecified atrial fibrillation: Secondary | ICD-10-CM

## 2022-04-23 DIAGNOSIS — Z539 Procedure and treatment not carried out, unspecified reason: Secondary | ICD-10-CM | POA: Insufficient documentation

## 2022-04-23 SURGERY — INVASIVE LAB ABORTED CASE

## 2022-04-23 MED ORDER — FUROSEMIDE 20 MG PO TABS: 20.0000 mg | ORAL_TABLET | Freq: Every day | ORAL | 4 refills | Status: DC | PRN

## 2022-04-23 SURGICAL SUPPLY — 1 items: ELECT DEFIB PAD ADLT CADENCE (PAD) ×2 IMPLANT

## 2022-04-23 NOTE — Telephone Encounter (Signed)
Called the pt and provided him with medication instruction as indicated in this message by Wallis Bamberg NP and Dr. Izora Ribas.   Pt aware we will send in lasix 20 mg po daily as needed for weight gain > 3 lbs over night or > 5 lbs/week.  He is aware that if the doubling up of his chlorthalidone for today doesn't help with his breathing, then he can take the lasix 20 mg daily for 2 days only, then go back to taking lasix 20 mg po daily as needed for weight gain > 3 lbs over night or > 5 lbs/week thereafter.   Confirmed the pharmacy of choice with the pt.   Pt verbalized understanding and agrees with this plan.

## 2022-04-23 NOTE — Progress Notes (Signed)
Patient arrived for Cardioversion .  EKG done and patient was in SR.  MD in to see patient and confirm rhythm.  Procedure was cancelled and patient was sent back home .

## 2022-04-23 NOTE — Telephone Encounter (Signed)
-----   Message from Flossie Dibble, NP sent at 04/23/2022  8:40 AM EDT ----- Regarding: FW: Spontaneous DCCV Please reach out to pt. He was to be DCCV today, but converted PTA.   Please send in lasix 20 mg PO to take as needed for weight gain > 3 lbs over night or > 5 lbs/week.   He is going to double his dose of chlorthalidone today per Dr. Ronette Deter. If that does not help with his breathing, please have time to take the lasix PO x 2 days, then PRN as indicated above.   Thank you, Victorino Dike ----- Message ----- From: Christell Constant, MD Sent: 04/23/2022   8:10 AM EDT To: Yvonne Kendall, MD; Will Jorja Loa, MD; # Subject: Spontaneous DCCV                               Converted prior to walking in; on amiodarone He notes orthopnea; he is CTAB, no pitting edema, and notes brisk response to diuresis  on chlorthalidone.  BP 145/95.  He is going to take his full dose of chlorthalidone 50 mg.  If this persists he may need short course of diuretics.  Thanks, MAC

## 2022-04-23 NOTE — H&P (Signed)
Patient in sinus rhythm with 1st heart block.  Procedure canceled.

## 2022-04-30 NOTE — H&P (Signed)
See prior note.  Patient in sinus rhythm with 1st heart block. Procedure canceled.

## 2022-05-12 ENCOUNTER — Other Ambulatory Visit: Payer: Self-pay | Admitting: Family Medicine

## 2022-05-13 ENCOUNTER — Other Ambulatory Visit: Payer: Self-pay | Admitting: Family Medicine

## 2022-05-13 DIAGNOSIS — M5442 Lumbago with sciatica, left side: Secondary | ICD-10-CM

## 2022-05-13 NOTE — Telephone Encounter (Signed)
This refill is not appropriate.  He was changed to Ozempic 2 mg weekly in February.

## 2022-05-26 ENCOUNTER — Ambulatory Visit: Payer: Self-pay

## 2022-05-26 NOTE — Patient Outreach (Signed)
  Care Coordination   05/26/2022 Name: LARWRENCE BRAZZEL MRN: [id number] DOB: 1955/05/19   Care Coordination Outreach Attempts:  An unsuccessful telephone outreach was attempted today to offer the patient information about available care coordination services.  Follow Up Plan:  Additional outreach attempts will be made to offer the patient care coordination information and services.   Encounter Outcome:  No Answer   Care Coordination Interventions:  No, not indicated    SIG Lysle Morales, BSW Social Worker [facility] Care Management  [phone]

## 2022-06-01 ENCOUNTER — Encounter: Payer: Self-pay | Admitting: Cardiology

## 2022-06-01 ENCOUNTER — Encounter: Payer: Self-pay | Admitting: *Deleted

## 2022-06-01 ENCOUNTER — Ambulatory Visit: Payer: Medicare Other | Attending: Cardiology | Admitting: Cardiology

## 2022-06-01 VITALS — BP 128/68 | HR 57 | Ht 70.0 in | Wt 214.0 lb

## 2022-06-01 DIAGNOSIS — I4819 Other persistent atrial fibrillation: Secondary | ICD-10-CM

## 2022-06-01 DIAGNOSIS — G4733 Obstructive sleep apnea (adult) (pediatric): Secondary | ICD-10-CM | POA: Diagnosis not present

## 2022-06-01 DIAGNOSIS — Z01812 Encounter for preprocedural laboratory examination: Secondary | ICD-10-CM | POA: Diagnosis not present

## 2022-06-01 DIAGNOSIS — D6869 Other thrombophilia: Secondary | ICD-10-CM | POA: Diagnosis not present

## 2022-06-01 DIAGNOSIS — I10 Essential (primary) hypertension: Secondary | ICD-10-CM | POA: Diagnosis not present

## 2022-06-01 DIAGNOSIS — E559 Vitamin D deficiency, unspecified: Secondary | ICD-10-CM | POA: Diagnosis not present

## 2022-06-01 DIAGNOSIS — I2510 Atherosclerotic heart disease of native coronary artery without angina pectoris: Secondary | ICD-10-CM | POA: Diagnosis not present

## 2022-06-01 DIAGNOSIS — Z79899 Other long term (current) drug therapy: Secondary | ICD-10-CM | POA: Diagnosis not present

## 2022-06-01 NOTE — Patient Instructions (Addendum)
Medication Instructions:  Your physician recommends that you continue on your current medications as directed. Please refer to the Current Medication list given to you today.  *If you need a refill on your cardiac medications before your next appointment, please call your pharmacy*   Lab Work: Cardioversion labs today: BMET & CBC  Pre procedure labs -- see procedure instruction letter:  BMP & CBC  If you have labs (blood work) drawn today and your tests are completely normal, you will receive your results only by: MyChart Message (if you have MyChart) OR A paper copy in the mail If you have any lab test that is abnormal or we need to change your treatment, we will call you to review the results.   Testing/Procedures: Your physician has recommended that you have a Cardioversion (DCCV). Electrical Cardioversion uses a jolt of electricity to your heart either through paddles or wired patches attached to your chest. This is a controlled, usually prescheduled, procedure. Defibrillation is done under light anesthesia in the hospital, and you usually go home the day of the procedure. This is done to get your heart back into a normal rhythm. You are not awake for the procedure. Please see the instruction sheet given to you today.   Your physician has requested that you have cardiac CT within 7 days PRIOR to your ablation. Cardiac computed tomography (CT) is a painless test that uses an x-ray machine to take clear, detailed pictures of your heart.  Please follow instruction below located under "other instructions". You will get a call from our office to schedule the date for this test.  Your physician has recommended that you have an ablation. Catheter ablation is a medical procedure used to treat some cardiac arrhythmias (irregular heartbeats). During catheter ablation, a long, thin, flexible tube is put into a blood vessel in your groin (upper thigh), or neck. This tube is called an ablation  catheter. It is then guided to your heart through the blood vessel. Radio frequency waves destroy small areas of heart tissue where abnormal heartbeats may cause an arrhythmia to start. Please follow instruction letter given to you today.  You will scheduled for 9/24 - the EP scheduler, April, will reach out to go over instructions and send them via mychart.   Follow-Up: At [facility], you and your health needs are our priority.  As part of our continuing mission to provide you with exceptional heart care, we have created designated Provider Care Teams.  These Care Teams include your primary Cardiologist (physician) and Advanced Practice Providers (APPs -  Physician Assistants and Nurse Practitioners) who all work together to provide you with the care you need, when you need it.  You have been referred to Dr. Mayford Knife to discuss Inspire    Your next appointment:   1 month(s) after your ablation  The format for your next appointment:   In Person  Provider:   [facility]   Thank you for choosing [facility]!!   Dory Horn, RN [phone]    Other Instructions  Electrical Cardioversion Electrical cardioversion is the delivery of a jolt of electricity to restore a normal rhythm to the heart. A rhythm that is too fast or is not regular (arrhythmia) keeps the heart from pumping blood well. There is also another type of cardioversion called a chemical (pharmacologic) cardioversion. This is when your health care provider gives you one or more medicines to bring back your regular heart rhythm. Electrical cardioversion is done as a scheduled  procedure for arrhythmiasthat are not life-threatening. Electrical cardioversion may also be done in an emergency for sudden life-threatening arrhythmias. Tell a health care provider about: Any allergies you have. All medicines you are taking, including vitamins, herbs, eye drops, creams, and over-the-counter medicines. Any problems you or  family members have had with sedatives or anesthesia. Any bleeding problems you have. Any surgeries you have had, including a pacemaker, defibrillator, or other implanted device. Any medical conditions you have. Whether you are pregnant or may be pregnant. What are the risks? Your provider will talk with you about risks. These include: Allergic reactions to medicines. Irritation to the skin on your chest or back where the sticky pads (electrodes) or paddles were put during electrical cardioversion. A blood clot that breaks free and travels to other parts of your body, such as your brain. Return of a worse abnormal heart rhythm that will need to be treated with medicines, a pacemaker, or an implantable cardioverter defibrillator (ICD). What happens before the procedure? Medicines Your provider may give you: Blood-thinning medicines (anticoagulants) so your blood does not clot as easily. If your provider gives you this medicine, you may need to take it for 4 weeks before the procedure. Medicines to help stabilize your heart rate and rhythm. Ask your provider about: Changing or stopping your regular medicines. These include any diabetes medicines or blood thinners you take. Taking medicines such as aspirin and ibuprofen. These medicines can thin your blood. Do not take them unless your provider tells you to. Taking over-the-counter medicines, vitamins, herbs, and supplements. General instructions Follow instructions from your provider about what you may eat and drink. Do not put any lotions, powders, or ointments on your chest and back for 24 hours before the procedure. They can cause problems with the electrodes or paddles used to deliver electricity to your heart. Do not wear jewelry as this can interfere with delivering electricity to your heart. If you will be going home right after the procedure, plan to have a responsible adult: Take you home from the hospital or clinic. You will not be  allowed to drive. Care for you for the time you are told. Tests You may have an exam or testing. This may include: Blood labs. A transesophageal echocardiogram (TEE). What happens during the procedure?     An IV will be inserted into one of your veins. You will be given a sedative. This helps you relax. Electrodes or metal paddles will be placed on your chest. They may be placed in one of these ways: One placed on your right chest, the other on the left ribs. One placed on your chest and the other on your back. An electrical shock will be delivered. The shock briefly stops (resets) your heart rhythm. Your provider will check to see if your heart rhythm is now normal. Some people need only one shock. Some need more to restore a normal heart rhythm. The procedure may vary among providers and hospitals. What happens after the procedure? Your blood pressure, heart rate, breathing rate, and blood oxygen level will be monitored until you leave the hospital or clinic. Your heart rhythm will be watched to make sure it does not change. This information is not intended to replace advice given to you by your health care provider. Make sure you discuss any questions you have with your health care provider. Document Revised: 08/21/2021 Document Reviewed: 08/21/2021 Elsevier Patient Education  2023 Elsevier Inc.    Cardiac Ablation Cardiac ablation is a procedure  to destroy (ablate) some heart tissue that is sending bad signals. These bad signals cause problems in heart rhythm. The heart has many areas that make these signals. If there are problems in these areas, they can make the heart beat in a way that is not normal. Destroying some tissues can help make the heart rhythm normal. Tell your doctor about: Any allergies you have. All medicines you are taking. These include vitamins, herbs, eye drops, creams, and over-the-counter medicines. Any problems you or family members have had with  medicines that make you fall asleep (anesthetics). Any blood disorders you have. Any surgeries you have had. Any medical conditions you have, such as kidney failure. Whether you are pregnant or may be pregnant. What are the risks? This is a safe procedure. But problems may occur, including: Infection. Bruising and bleeding. Bleeding into the chest. Stroke or blood clots. Damage to nearby areas of your body. Allergies to medicines or dyes. The need for a pacemaker if the normal system is damaged. Failure of the procedure to treat the problem. What happens before the procedure? Medicines Ask your doctor about: Changing or stopping your normal medicines. This is important. Taking aspirin and ibuprofen. Do not take these medicines unless your doctor tells you to take them. Taking other medicines, vitamins, herbs, and supplements. General instructions Follow instructions from your doctor about what you cannot eat or drink. Plan to have someone take you home from the hospital or clinic. If you will be going home right after the procedure, plan to have someone with you for 24 hours. Ask your doctor what steps will be taken to prevent infection. What happens during the procedure?  An IV tube will be put into one of your veins. You will be given a medicine to help you relax. The skin on your neck or groin will be numbed. A cut (incision) will be made in your neck or groin. A needle will be put through your cut and into a large vein. A tube (catheter) will be put into the needle. The tube will be moved to your heart. Dye may be put through the tube. This helps your doctor see your heart. Small devices (electrodes) on the tube will send out signals. A type of energy will be used to destroy some heart tissue. The tube will be taken out. Pressure will be held on your cut. This helps stop bleeding. A bandage will be put over your cut. The exact procedure may vary among doctors and  hospitals. What happens after the procedure? You will be watched until you leave the hospital or clinic. This includes checking your heart rate, breathing rate, oxygen, and blood pressure. Your cut will be watched for bleeding. You will need to lie still for a few hours. Do not drive for 24 hours or as long as your doctor tells you. Summary Cardiac ablation is a procedure to destroy some heart tissue. This is done to treat heart rhythm problems. Tell your doctor about any medical conditions you may have. Tell him or her about all medicines you are taking to treat them. This is a safe procedure. But problems may occur. These include infection, bruising, bleeding, and damage to nearby areas of your body. Follow what your doctor tells you about food and drink. You may also be told to change or stop some of your medicines. After the procedure, do not drive for 24 hours or as long as your doctor tells you. This information is not intended to replace  advice given to you by your health care provider. Make sure you discuss any questions you have with your health care provider. Document Revised: 03/21/2021 Document Reviewed: 12/01/2018 Elsevier Patient Education  2023 [employer].

## 2022-06-01 NOTE — H&P (View-Only) (Signed)
 Electrophysiology Office Note   Date:  06/01/2022   ID:  Walter Salas, DOB 03/29/1955, MRN [id number]  PCP:  Sonnenberg, Eric G, MD  Cardiologist:  End Primary Electrophysiologist:  Myia Bergh Martin Geral Tuch, MD    Chief Complaint: AF3   History of Present Illness: Walter Salas is a 67 y.o. male who is being seen today for the evaluation of AF at the request of Woody, [name], NP. Presenting today for electrophysiology evaluation.  He has a history significant hypertension, coronary artery disease post PCI to the circumflex and RCA in 2014, TIA, OSA intolerant to CPAP, GERD, diabetes, hypertension, CVA, diastolic heart failure.  He has had CVAs in the past.  Most recently February 2023 with lacunar and thalamus infarcts.  He presented to [facility] 04/08/2022 with worsening shortness of breath and orthopnea.  He had run out of his chlorthalidone.  He was found to be in atrial fibrillation.  He has been started on amiodarone.  He presented for cardioversion, but had converted to sinus rhythm.  He feels weak, fatigued, short of breath.  He is feeling that way today and has for the last month and a half.  He would prefer a rhythm control strategy.  Today, he denies symptoms of palpitations, chest pain, orthopnea, PND, lower extremity edema, claudication, dizziness, presyncope, syncope, bleeding, or neurologic sequela. The patient is tolerating medications without difficulties.    Past Medical History:  Diagnosis Date   (HFpEF) heart failure with preserved ejection fraction (HCC)    a. 2018 Echo: EF 60-65%, normal wall motion, Gr1DD, mild aortic valve calcification; b. 12/2017 Echo: EF 60-65%, no rwma, Ao root 4.3cm, Asc Ao 4.4 cm, mildly dil LA.   Agent orange exposure    Anginal pain (HCC)    Arthritis    CAD (coronary artery disease)    a. s/p PCI/DES to the LCx and RCA in 2014; b. 05/2016 MV: EF 55-65%. Small, mild defect in basal anterolateral location->infarct w/o ischemia.   Cancer  (HCC)    skin   Chronic cough    Chronic pain    Colon polyp    Depression    Diabetes mellitus without complication (HCC)    Diverticulosis    GERD (gastroesophageal reflux disease)    History of kidney stones    History of stomach ulcers    History of subdural hematoma 12/14/2012   HOH (hard of hearing)    Hx of laminectomy 1999   Hyperlipidemia    Hypertension    Lacunar infarct, acute (HCC)    Myocardial infarction (HCC)    Sleep apnea    Subdural hematoma (HCC) 12/14/2012   Syncope    a. Event monitor 2018: NSR with first-degree AV block, no significant arrhythmias or pauses; b.  Myoview showed a small in size, mild in severity fixed basal and anterior lateral defect, no ischemia, LVEF 55 to 65%, low risk study   Thoracic aortic aneurysm (HCC)    a. TTE 2018: Mildly dil Ao root- 4.2 cm; b. CTA aorta 6/18: Asc Ao aneurysmal dil w max diam of 4.3 cm; b. 12/2019 CTA Chest: stable 4.3cm thor Ao Aneurysm.   Ulcer    Past Surgical History:  Procedure Laterality Date   BACK SURGERY  1993, 1994, 1997, 1998   CARPAL TUNNEL RELEASE Bilateral 1990   CATARACT EXTRACTION W/PHACO Left 03/08/2018   Procedure: CATARACT EXTRACTION PHACO AND INTRAOCULAR LENS PLACEMENT (IOC) LEFT;  Surgeon: Porfilio, William, MD;  Location: [facility];  Service: Ophthalmology;    Laterality: Left;  US  00:24.2 CDE 3.25 Fluid pack lot # [id number]   CATARACT EXTRACTION W/PHACO Right 05/24/2018   Procedure: CATARACT EXTRACTION PHACO AND INTRAOCULAR LENS PLACEMENT (IOC) RIGHT;  Surgeon: Porfilio, William, MD;  Location: [facility];  Service: Ophthalmology;  Laterality: Right;  Diabetic - oral meds   CERVICAL FUSION  2000. 2003   CHOLECYSTECTOMY  2000   COLONOSCOPY WITH PROPOFOL N/A 02/23/2017   Procedure: COLONOSCOPY WITH PROPOFOL;  Surgeon: Skulskie, Martin U, MD;  Location: [facility];  Service: Endoscopy;  Laterality: N/A;   CORONARY ANGIOPLASTY WITH STENT PLACEMENT  2014   [facility] to  dRCA,mRCA, mCIRC   ESOPHAGOGASTRODUODENOSCOPY (EGD) WITH PROPOFOL N/A 02/23/2017   Procedure: ESOPHAGOGASTRODUODENOSCOPY (EGD) WITH PROPOFOL;  Surgeon: Skulskie, Martin U, MD;  Location: [facility];  Service: Endoscopy;  Laterality: N/A;   PULSE GENERATOR IMPLANT N/A 03/04/2020   Procedure: PULSE GENERATOR PLACEMENT;  Surgeon: Cook, Steven, MD;  Location: [facility];  Service: Neurosurgery;  Laterality: N/A;  1ST CASE   SHOULDER ARTHROSCOPY Right 2012   THORACIC LAMINECTOMY FOR SPINAL CORD STIMULATOR N/A 02/26/2020   Procedure: THORACIC LAMINECTOMY FOR SPINAL CORD STIMULATOR PADDLE TRIAL;  Surgeon: Cook, Steven, MD;  Location: [facility];  Service: Neurosurgery;  Laterality: N/A;  1ST CASE   TUMOR EXCISION  2007     Current Outpatient Medications  Medication Sig Dispense Refill   amiodarone (PACERONE) 200 MG tablet Take 2 tablets by mouth twice a day for 2 weeks then reduce to 1 tablet by mouth twice a day 290 tablet 3   amLODipine (NORVASC) 2.5 MG tablet TAKE ONE TABLET (2.5 MG) BY MOUTH EVERY DAY 90 tablet 0   apixaban (ELIQUIS) 5 MG TABS tablet Take 5 mg by mouth 2 (two) times daily.     aspirin 81 MG EC tablet Take 81 mg by mouth daily at 12 noon.     atorvastatin (LIPITOR) 80 MG tablet Take 80 mg by mouth daily.     chlorthalidone (HYGROTON) 50 MG tablet Take 25 mg by mouth daily.     clopidogrel (PLAVIX) 75 MG tablet TAKE 1 TABLET BY MOUTH DAILY. 90 tablet 1   DULoxetine 40 MG CPEP Take 20 mg by mouth daily. 30 capsule 0   ezetimibe (ZETIA) 10 MG tablet TAKE 1 TABLET BY MOUTH DAILY. 90 tablet 3   fenofibrate (TRICOR) 145 MG tablet Take 1 tablet (145 mg total) by mouth daily. 30 tablet 0   furosemide (LASIX) 20 MG tablet Take 1 tablet (20 mg total) by mouth daily as needed (for weight gain > 3 lbs over night or > 5 lbs/week.). 30 tablet 4   glucose blood test strip Check once daily, E11.9 100 each 12   Icosapent Ethyl (VASCEPA) 0.5 g CAPS Take 4 capsules (2 g total) by mouth 2 (two) times  daily. 720 capsule 3   losartan (COZAAR) 25 MG tablet Take 25 mg by mouth daily.     metFORMIN (GLUCOPHAGE) 1000 MG tablet Take 1,000 mg by mouth 2 (two) times daily with a meal.     methyl salicylate liquid Apply 1 application topically as needed for muscle pain.     metroNIDAZOLE (FLAGYL) 250 MG tablet metronidazole 250 mg tablet     nitroGLYCERIN (NITROSTAT) 0.4 MG SL tablet Place 1 tablet (0.4 mg total) under the tongue every 5 (five) minutes as needed for chest pain. May take up to 3 doses. 30 tablet 0   omeprazole (PRILOSEC) 20 MG capsule Take   20 mg by mouth daily.     polyvinyl alcohol (LIQUIFILM TEARS) 1.4 % ophthalmic solution Place 1 drop into both eyes as needed for dry eyes.      potassium chloride SA (K-DUR,KLOR-CON) 20 MEQ tablet Take 1 tablet (20 mEq total) by mouth daily. 90 tablet 3   pregabalin (LYRICA) 75 MG capsule Take 75 mg by mouth 3 (three) times daily.     Semaglutide, 2 MG/DOSE, 8 MG/3ML SOPN Inject 2 mg as directed once a week. 9 mL 3   tamsulosin (FLOMAX) 0.4 MG CAPS capsule TAKE 1 CAPSULE BY MOUTH ONCE DAILY 30 capsule 3   traMADol (ULTRAM) 50 MG tablet TAKE 2 TABLETS BY MOUTH EVERY 8 HOURS ASNEEDED FOR PAIN 90 tablet 0   vitamin B-12 (CYANOCOBALAMIN) 1000 MCG tablet Take 1 tablet (1,000 mcg total) by mouth daily. 30 tablet 0   lidocaine (LIDODERM) 5 % 1 patch daily as needed-Remove & Discard patch within 12 hours or as directed by MD     predniSONE (DELTASONE) 20 MG tablet Take 2 tablets (40 mg total) by mouth daily with breakfast. 10 tablet 0   No current facility-administered medications for this visit.    Allergies:   Gabapentin, Isosorbide mononitrate er [isosorbide dinitrate], Lisinopril, and Tylenol [acetaminophen]   Social History:  The patient  reports that he has been smoking cigarettes. He has a 50.00 pack-year smoking history. He has never used smokeless tobacco. He reports that he does not drink alcohol and does not use drugs.   Family History:  The  patient's family history includes Diabetes in his brother, father, mother, and sister; Heart disease in his brother, father, paternal grandfather, and sister.    ROS:  Please see the history of present illness.   Otherwise, review of systems is positive for none.   All other systems are reviewed and negative.    PHYSICAL EXAM: VS:  BP 128/68   Pulse (!) 57   Ht 5' 10" (1.778 m)   Wt 214 lb (97.1 kg)   SpO2 97%   BMI 30.71 kg/m  , BMI Body mass index is 30.71 kg/m. GEN: Well nourished, well developed, in no acute distress  HEENT: normal  Neck: no JVD, carotid bruits, or masses Cardiac: irregular; no murmurs, rubs, or gallops,no edema  Respiratory:  clear to auscultation bilaterally, normal work of breathing GI: soft, nontender, nondistended, + BS MS: no deformity or atrophy  Skin: warm and dry Neuro:  Strength and sensation are intact Psych: euthymic mood, full affect  EKG:  EKG is ordered today. Personal review of the ekg ordered shows atrial fibrillation  Recent Labs: 08/06/2021: ALT 15 04/08/2022: B Natriuretic Peptide 241.0 04/15/2022: BUN 23; Creatinine, Ser 0.95; Hemoglobin 15.4; Platelets 268; Potassium 4.3; Sodium 139    Lipid Panel     Component Value Date/Time   CHOL 99 08/06/2021 0850   TRIG 113.0 08/06/2021 0850   HDL 35.50 (L) 08/06/2021 0850   CHOLHDL 3 08/06/2021 0850   VLDL 22.6 08/06/2021 0850   LDLCALC 41 08/06/2021 0850   LDLDIRECT 90.0 06/30/2021 0955     Wt Readings from Last 3 Encounters:  06/01/22 214 lb (97.1 kg)  04/15/22 214 lb 3.2 oz (97.2 kg)  04/08/22 220 lb (99.8 kg)      Other studies Reviewed: Additional studies/ records that were reviewed today include: TTE 02/17/22  Review of the above records today demonstrates:   1. Left ventricular ejection fraction, by estimation, is 55 to 60%. The  left   ventricle has normal function. The left ventricle has no regional  wall motion abnormalities. There is mild asymmetric left ventricular   hypertrophy of the basal-septal segment.  Left ventricular diastolic parameters are consistent with Grade II  diastolic dysfunction (pseudonormalization).   2. Right ventricular systolic function is normal. The right ventricular  size is normal. Tricuspid regurgitation signal is inadequate for assessing  PA pressure.   3. Left atrial size was mildly dilated.   4. The mitral valve is normal in structure. No evidence of mitral valve  regurgitation. No evidence of mitral stenosis.   5. The aortic valve is normal in structure. Aortic valve regurgitation is  not visualized. No aortic stenosis is present.   6. There is borderline dilatation of the aortic root, measuring 39 mm.  There is mild dilatation of the ascending aorta, measuring 40 mm.   7. The inferior vena cava is normal in size with greater than 50%  respiratory variability, suggesting right atrial pressure of 3 mmHg    ASSESSMENT AND PLAN:  1.  Persistent atrial fibrillation: CHA2DS2-VASc of 7.  Currently on amiodarone and Eliquis.  He remains in atrial fibrillation.  He feels quite poorly.  Katyra Tomassetti plan for cardioversion.  He would prefer to not be on long-term antiarrhythmics.  Due to that, we Court Gracia plan for ablation.  Risk and benefits have been discussed.  He understands the risks and is agreed to the procedure.  Risk, benefits, and alternatives to EP study and radiofrequency ablation for afib were also discussed in detail today. These risks include but are not limited to stroke, bleeding, vascular damage, tamponade, perforation, damage to the esophagus, lungs, and other structures, pulmonary vein stenosis, worsening renal function, and death. The patient understands these risk and wishes to proceed.  We Raelle Chambers therefore proceed with catheter ablation at the next available time.  Carto, ICE, anesthesia are requested for the procedure.  Talayla Doyel also obtain CT PV protocol prior to the procedure to exclude LAA thrombus and further evaluate atrial  anatomy.   2.  Secondary hypercoagulable state: Currently on Eliquis for atrial fibrillation  3.  Coronary artery disease: PCI to the circumflex and RCA.  No ischemic symptoms.  Plan per primary cardiology.  4.  Hypertension: Currently well-controlled  5.  High risk medication monitoring: Currently on amiodarone.  Recent labs within normal limits.   Current medicines are reviewed at length with the patient today.   The patient does not have concerns regarding his medicines.  The following changes were made today:  none  Labs/ tests ordered today include:  Orders Placed This Encounter  Procedures   CT CARDIAC MORPH/PULM VEIN W/CM&W/O CA SCORE   Basic metabolic panel   CBC   EKG 12-Lead     Disposition:   FU with Bela Bonaparte 3 months  Signed, Waqas Bruhl Martin Kaori Jumper, MD  06/01/2022 10:07 AM     [facility] [address] [place] [address] [phone] (office) [phone] (fax)  

## 2022-06-01 NOTE — Progress Notes (Signed)
Electrophysiology Office Note   Date:  06/01/2022   ID:  Alvaro, Currin March 03, 1955, MRN [id number]  PCP:  Glori Luis, MD  Cardiologist:  End Primary Electrophysiologist:  Lakoda Mcanany Jorja Loa, MD    Chief Complaint: AF3   History of Present Illness: IZEIAH SILVERSTONE is a 67 y.o. male who is being seen today for the evaluation of AF at the request of Flossie Dibble, NP. Presenting today for electrophysiology evaluation.  He has a history significant hypertension, coronary artery disease post PCI to the circumflex and RCA in 2014, TIA, OSA intolerant to CPAP, GERD, diabetes, hypertension, CVA, diastolic heart failure.  He has had CVAs in the past.  Most recently February 2023 with lacunar and thalamus infarcts.  He presented to [facility] 04/08/2022 with worsening shortness of breath and orthopnea.  He had run out of his chlorthalidone.  He was found to be in atrial fibrillation.  He has been started on amiodarone.  He presented for cardioversion, but had converted to sinus rhythm.  He feels weak, fatigued, short of breath.  He is feeling that way today and has for the last month and a half.  He would prefer a rhythm control strategy.  Today, he denies symptoms of palpitations, chest pain, orthopnea, PND, lower extremity edema, claudication, dizziness, presyncope, syncope, bleeding, or neurologic sequela. The patient is tolerating medications without difficulties.    Past Medical History:  Diagnosis Date   (HFpEF) heart failure with preserved ejection fraction (HCC)    a. 2018 Echo: EF 60-65%, normal wall motion, Gr1DD, mild aortic valve calcification; b. 12/2017 Echo: EF 60-65%, no rwma, Ao root 4.3cm, Asc Ao 4.4 cm, mildly dil LA.   Agent orange exposure    Anginal pain (HCC)    Arthritis    CAD (coronary artery disease)    a. s/p PCI/DES to the LCx and RCA in 2014; b. 05/2016 MV: EF 55-65%. Small, mild defect in basal anterolateral location->infarct w/o ischemia.   Cancer  [facility])    skin   Chronic cough    Chronic pain    Colon polyp    Depression    Diabetes mellitus without complication (HCC)    Diverticulosis    GERD (gastroesophageal reflux disease)    History of kidney stones    History of stomach ulcers    History of subdural hematoma 12/14/2012   HOH (hard of hearing)    Hx of laminectomy 1999   Hyperlipidemia    Hypertension    Lacunar infarct, acute (HCC)    Myocardial infarction (HCC)    Sleep apnea    Subdural hematoma (HCC) 12/14/2012   Syncope    a. Event monitor 2018: NSR with first-degree AV block, no significant arrhythmias or pauses; b.  Myoview showed a small in size, mild in severity fixed basal and anterior lateral defect, no ischemia, LVEF 55 to 65%, low risk study   Thoracic aortic aneurysm (HCC)    a. TTE 2018: Mildly dil Ao root- 4.2 cm; b. CTA aorta 6/18: Asc Ao aneurysmal dil w max diam of 4.3 cm; b. 12/2019 CTA Chest: stable 4.3cm thor Ao Aneurysm.   Ulcer    Past Surgical History:  Procedure Laterality Date   BACK SURGERY  1993, 1994, 1997, 1998   CARPAL TUNNEL RELEASE Bilateral 1990   CATARACT EXTRACTION W/PHACO Left 03/08/2018   Procedure: CATARACT EXTRACTION PHACO AND INTRAOCULAR LENS PLACEMENT (IOC) LEFT;  Surgeon: Galen Manila, MD;  Location: [facility] ORS;  Service: Ophthalmology;  Laterality: Left;  [place]  00:24.2 CDE 3.25 Fluid pack lot # [id number]   CATARACT EXTRACTION W/PHACO Right 05/24/2018   Procedure: CATARACT EXTRACTION PHACO AND INTRAOCULAR LENS PLACEMENT (IOC) RIGHT;  Surgeon: Galen Manila, MD;  Location: [facility];  Service: Ophthalmology;  Laterality: Right;  Diabetic - oral meds   CERVICAL FUSION  2000. 2003   CHOLECYSTECTOMY  2000   COLONOSCOPY WITH PROPOFOL N/A 02/23/2017   Procedure: COLONOSCOPY WITH PROPOFOL;  Surgeon: Christena Deem, MD;  Location: [facility] ENDOSCOPY;  Service: Endoscopy;  Laterality: N/A;   CORONARY ANGIOPLASTY WITH STENT PLACEMENT  2014   [facility]   ESOPHAGOGASTRODUODENOSCOPY (EGD) WITH PROPOFOL N/A 02/23/2017   Procedure: ESOPHAGOGASTRODUODENOSCOPY (EGD) WITH PROPOFOL;  Surgeon: Christena Deem, MD;  Location: [facility] ENDOSCOPY;  Service: Endoscopy;  Laterality: N/A;   PULSE GENERATOR IMPLANT N/A 03/04/2020   Procedure: PULSE GENERATOR PLACEMENT;  Surgeon: Lucy Chris, MD;  Location: [facility] ORS;  Service: Neurosurgery;  Laterality: N/A;  1ST CASE   SHOULDER ARTHROSCOPY Right 2012   THORACIC LAMINECTOMY FOR SPINAL CORD STIMULATOR N/A 02/26/2020   Procedure: THORACIC LAMINECTOMY FOR SPINAL CORD STIMULATOR PADDLE TRIAL;  Surgeon: Lucy Chris, MD;  Location: [facility] ORS;  Service: Neurosurgery;  Laterality: N/A;  1ST CASE   TUMOR EXCISION  2007     Current Outpatient Medications  Medication Sig Dispense Refill   amiodarone (PACERONE) 200 MG tablet Take 2 tablets by mouth twice a day for 2 weeks then reduce to 1 tablet by mouth twice a day 290 tablet 3   amLODipine (NORVASC) 2.5 MG tablet TAKE ONE TABLET (2.5 MG) BY MOUTH EVERY DAY 90 tablet 0   apixaban (ELIQUIS) 5 MG TABS tablet Take 5 mg by mouth 2 (two) times daily.     aspirin 81 MG EC tablet Take 81 mg by mouth daily at 12 noon.     atorvastatin (LIPITOR) 80 MG tablet Take 80 mg by mouth daily.     chlorthalidone (HYGROTON) 50 MG tablet Take 25 mg by mouth daily.     clopidogrel (PLAVIX) 75 MG tablet TAKE 1 TABLET BY MOUTH DAILY. 90 tablet 1   DULoxetine 40 MG CPEP Take 20 mg by mouth daily. 30 capsule 0   ezetimibe (ZETIA) 10 MG tablet TAKE 1 TABLET BY MOUTH DAILY. 90 tablet 3   fenofibrate (TRICOR) 145 MG tablet Take 1 tablet (145 mg total) by mouth daily. 30 tablet 0   furosemide (LASIX) 20 MG tablet Take 1 tablet (20 mg total) by mouth daily as needed (for weight gain > 3 lbs over night or > 5 lbs/week.). 30 tablet 4   glucose blood test strip Check once daily, E11.9 100 each 12   Icosapent Ethyl (VASCEPA) 0.5 g CAPS Take 4 capsules (2 g total) by mouth 2 (two) times  daily. 720 capsule 3   losartan (COZAAR) 25 MG tablet Take 25 mg by mouth daily.     metFORMIN (GLUCOPHAGE) 1000 MG tablet Take 1,000 mg by mouth 2 (two) times daily with a meal.     methyl salicylate liquid Apply 1 application topically as needed for muscle pain.     metroNIDAZOLE (FLAGYL) 250 MG tablet metronidazole 250 mg tablet     nitroGLYCERIN (NITROSTAT) 0.4 MG SL tablet Place 1 tablet (0.4 mg total) under the tongue every 5 (five) minutes as needed for chest pain. May take up to 3 doses. 30 tablet 0   omeprazole (PRILOSEC) 20 MG capsule Take  20 mg by mouth daily.     polyvinyl alcohol (LIQUIFILM TEARS) 1.4 % ophthalmic solution Place 1 drop into both eyes as needed for dry eyes.      potassium chloride SA (K-DUR,KLOR-CON) 20 MEQ tablet Take 1 tablet (20 mEq total) by mouth daily. 90 tablet 3   pregabalin (LYRICA) 75 MG capsule Take 75 mg by mouth 3 (three) times daily.     Semaglutide, 2 MG/DOSE, 8 MG/3ML SOPN Inject 2 mg as directed once a week. 9 mL 3   tamsulosin (FLOMAX) 0.4 MG CAPS capsule TAKE 1 CAPSULE BY MOUTH ONCE DAILY 30 capsule 3   traMADol (ULTRAM) 50 MG tablet TAKE 2 TABLETS BY MOUTH EVERY 8 HOURS ASNEEDED FOR PAIN 90 tablet 0   vitamin B-12 (CYANOCOBALAMIN) 1000 MCG tablet Take 1 tablet (1,000 mcg total) by mouth daily. 30 tablet 0   lidocaine (LIDODERM) 5 % 1 patch daily as needed-Remove & Discard patch within 12 hours or as directed by MD     predniSONE (DELTASONE) 20 MG tablet Take 2 tablets (40 mg total) by mouth daily with breakfast. 10 tablet 0   No current facility-administered medications for this visit.    Allergies:   Gabapentin, Isosorbide mononitrate er [isosorbide dinitrate], Lisinopril, and Tylenol [acetaminophen]   Social History:  The patient  reports that he has been smoking cigarettes. He has a 50.00 pack-year smoking history. He has never used smokeless tobacco. He reports that he does not drink alcohol and does not use drugs.   Family History:  The  patient's family history includes Diabetes in his brother, father, mother, and sister; Heart disease in his brother, father, paternal grandfather, and sister.    ROS:  Please see the history of present illness.   Otherwise, review of systems is positive for none.   All other systems are reviewed and negative.    PHYSICAL EXAM: VS:  BP 128/68   Pulse (!) 57   Ht 5\' 10"  (1.778 m)   Wt 214 lb (97.1 kg)   SpO2 97%   BMI 30.71 kg/m  , BMI Body mass index is 30.71 kg/m. GEN: Well nourished, well developed, in no acute distress  HEENT: normal  Neck: no JVD, carotid bruits, or masses Cardiac: irregular; no murmurs, rubs, or gallops,no edema  Respiratory:  clear to auscultation bilaterally, normal work of breathing GI: soft, nontender, nondistended, + BS MS: no deformity or atrophy  Skin: warm and dry Neuro:  Strength and sensation are intact Psych: euthymic mood, full affect  EKG:  EKG is ordered today. Personal review of the ekg ordered shows atrial fibrillation  Recent Labs: 08/06/2021: ALT 15 04/08/2022: B Natriuretic Peptide 241.0 04/15/2022: BUN 23; Creatinine, Ser 0.95; Hemoglobin 15.4; Platelets 268; Potassium 4.3; Sodium 139    Lipid Panel     Component Value Date/Time   CHOL 99 08/06/2021 0850   TRIG 113.0 08/06/2021 0850   HDL 35.50 (L) 08/06/2021 0850   CHOLHDL 3 08/06/2021 0850   VLDL 22.6 08/06/2021 0850   LDLCALC 41 08/06/2021 0850   LDLDIRECT 90.0 06/30/2021 0955     Wt Readings from Last 3 Encounters:  06/01/22 214 lb (97.1 kg)  04/15/22 214 lb 3.2 oz (97.2 kg)  04/08/22 220 lb (99.8 kg)      Other studies Reviewed: Additional studies/ records that were reviewed today include: TTE 02/17/22  Review of the above records today demonstrates:   1. Left ventricular ejection fraction, by estimation, is 55 to 60%. The  left  ventricle has normal function. The left ventricle has no regional  wall motion abnormalities. There is mild asymmetric left ventricular   hypertrophy of the basal-septal segment.  Left ventricular diastolic parameters are consistent with Grade II  diastolic dysfunction (pseudonormalization).   2. Right ventricular systolic function is normal. The right ventricular  size is normal. Tricuspid regurgitation signal is inadequate for assessing  PA pressure.   3. Left atrial size was mildly dilated.   4. The mitral valve is normal in structure. No evidence of mitral valve  regurgitation. No evidence of mitral stenosis.   5. The aortic valve is normal in structure. Aortic valve regurgitation is  not visualized. No aortic stenosis is present.   6. There is borderline dilatation of the aortic root, measuring 39 mm.  There is mild dilatation of the ascending aorta, measuring 40 mm.   7. The inferior vena cava is normal in size with greater than 50%  respiratory variability, suggesting right atrial pressure of 3 mmHg    ASSESSMENT AND PLAN:  1.  Persistent atrial fibrillation: CHA2DS2-VASc of 7.  Currently on amiodarone and Eliquis.  He remains in atrial fibrillation.  He feels quite poorly.  Lawana Hartzell plan for cardioversion.  He would prefer to not be on long-term antiarrhythmics.  Due to that, we Syncere Eble plan for ablation.  Risk and benefits have been discussed.  He understands the risks and is agreed to the procedure.  Risk, benefits, and alternatives to EP study and radiofrequency ablation for afib were also discussed in detail today. These risks include but are not limited to stroke, bleeding, vascular damage, tamponade, perforation, damage to the esophagus, lungs, and other structures, pulmonary vein stenosis, worsening renal function, and death. The patient understands these risk and wishes to proceed.  We Maven Rosander therefore proceed with catheter ablation at the next available time.  Carto, ICE, anesthesia are requested for the procedure.  Gresham Caetano also obtain CT PV protocol prior to the procedure to exclude LAA thrombus and further evaluate atrial  anatomy.   2.  Secondary hypercoagulable state: Currently on Eliquis for atrial fibrillation  3.  Coronary artery disease: PCI to the circumflex and RCA.  No ischemic symptoms.  Plan per primary cardiology.  4.  Hypertension: Currently well-controlled  5.  High risk medication monitoring: Currently on amiodarone.  Recent labs within normal limits.   Current medicines are reviewed at length with the patient today.   The patient does not have concerns regarding his medicines.  The following changes were made today:  none  Labs/ tests ordered today include:  Orders Placed This Encounter  Procedures   CT CARDIAC MORPH/PULM VEIN W/CM&W/O CA SCORE   Basic metabolic panel   CBC   EKG 12-Lead     Disposition:   FU with [place] 3 months  Signed, Sweet Jarvis Jorja Loa, MD  06/01/2022 10:07 AM     [facility] [address] [place] [address] [phone] (office) [phone] (fax)

## 2022-06-02 ENCOUNTER — Encounter: Payer: Self-pay | Admitting: Pharmacist

## 2022-06-02 ENCOUNTER — Other Ambulatory Visit: Payer: Medicare Other

## 2022-06-02 ENCOUNTER — Other Ambulatory Visit: Payer: Self-pay | Admitting: *Deleted

## 2022-06-02 ENCOUNTER — Telehealth: Payer: Self-pay | Admitting: *Deleted

## 2022-06-02 DIAGNOSIS — T466X5A Adverse effect of antihyperlipidemic and antiarteriosclerotic drugs, initial encounter: Secondary | ICD-10-CM

## 2022-06-02 DIAGNOSIS — E559 Vitamin D deficiency, unspecified: Secondary | ICD-10-CM

## 2022-06-02 LAB — BASIC METABOLIC PANEL
BUN/Creatinine Ratio: 16 (ref 10–24)
BUN: 18 mg/dL (ref 8–27)
CO2: 22 mmol/L (ref 20–29)
Calcium: 9.6 mg/dL (ref 8.6–10.2)
Chloride: 99 mmol/L (ref 96–106)
Creatinine, Ser: 1.15 mg/dL (ref 0.76–1.27)
Glucose: 116 mg/dL — ABNORMAL HIGH (ref 70–99)
Potassium: 4.4 mmol/L (ref 3.5–5.2)
Sodium: 138 mmol/L (ref 134–144)
eGFR: 69 mL/min/{1.73_m2} (ref >59)

## 2022-06-02 LAB — CBC
Hematocrit: 46.7 % (ref 37.5–51.0)
Hemoglobin: 15.7 g/dL (ref 13.0–17.7)
MCH: 27.9 pg (ref 26.6–33.0)
MCHC: 33.6 g/dL (ref 31.5–35.7)
MCV: 83 fL (ref 79–97)
Platelets: 223 10*3/uL (ref 150–450)
RBC: 5.62 x10E6/uL (ref 4.14–5.80)
RDW: 14.4 % (ref 11.6–15.4)
WBC: 6.7 10*3/uL (ref 3.4–10.8)

## 2022-06-02 NOTE — Telephone Encounter (Signed)
Form received completed & faxed back

## 2022-06-02 NOTE — Telephone Encounter (Signed)
Noted. Will sign when this comes in.

## 2022-06-02 NOTE — Progress Notes (Deleted)
Called [facility] to have Vit D added to labs drawn yesterday by another office. Since there were no orders found. Please watch out for efax to sign and return to complete the add on request

## 2022-06-02 NOTE — Progress Notes (Signed)
[facility]) [facility] Quality Pharmacy Team Statin Quality Measure Assessment  06/02/2022  Walter Salas 1955/09/18 [id number]  Per review of chart and payor information, patient has a diagnosis of diabetes but is not currently filling a statin prescription.  This places patient into the Statin Use In Patients with Diabetes (SUPD) measure for CMS.    Patient has documented trials of statins with reported myalgia, but no corresponding CPT codes that would exclude patient from SUPD measure.  Patient is on Repatha and Ezetimibe.  He has an upcoming appointment on 06/03/22.  If deemed therapeutically appropriate, a statin exclusion code could be associated with the upcoming visit. Doing so would remove the patient from the measure.     Component Value Date/Time   CHOL 99 08/06/2021 0850   TRIG 113.0 08/06/2021 0850   HDL 35.50 (L) 08/06/2021 0850   CHOLHDL 3 08/06/2021 0850   VLDL 22.6 08/06/2021 0850   LDLCALC 41 08/06/2021 0850   LDLDIRECT 90.0 06/30/2021 0955    Please consider associating ONE of the following statin exclusion codes with the upcoming visit.       Code for past statin intolerance or  other exclusions (required annually)  Provider Requirements: Associate code during an office visit or telehealth encounter  Drug Induced Myopathy G72.0   Myopathy, unspecified G72.9   Myositis, unspecified M60.9   Rhabdomyolysis M62.82   Cirrhosis of liver K74.69   Prediabetes R73.03   PCOS E28.2   Plan: Route note to Patient's PCP  Beecher Mcardle, PharmD, BCACP [facility] Clinical Pharmacist [phone]

## 2022-06-02 NOTE — Telephone Encounter (Signed)
I called [facility] to have Vit D added to labs drawn yesterday by another office. Since there were no orders found for this patients 8:15 appt this morning. Please watch out for e-fax to sign and return to complete the add on request

## 2022-06-03 ENCOUNTER — Ambulatory Visit (INDEPENDENT_AMBULATORY_CARE_PROVIDER_SITE_OTHER): Payer: Medicare Other | Admitting: Family Medicine

## 2022-06-03 ENCOUNTER — Encounter: Payer: Self-pay | Admitting: Family Medicine

## 2022-06-03 ENCOUNTER — Ambulatory Visit (INDEPENDENT_AMBULATORY_CARE_PROVIDER_SITE_OTHER): Payer: Medicare Other

## 2022-06-03 VITALS — BP 118/66 | HR 77 | Temp 98.3°F | Ht 70.0 in | Wt 212.6 lb

## 2022-06-03 DIAGNOSIS — Z7984 Long term (current) use of oral hypoglycemic drugs: Secondary | ICD-10-CM

## 2022-06-03 DIAGNOSIS — M545 Low back pain, unspecified: Secondary | ICD-10-CM

## 2022-06-03 DIAGNOSIS — E119 Type 2 diabetes mellitus without complications: Secondary | ICD-10-CM | POA: Diagnosis not present

## 2022-06-03 DIAGNOSIS — Z7985 Long-term (current) use of injectable non-insulin antidiabetic drugs: Secondary | ICD-10-CM

## 2022-06-03 DIAGNOSIS — G8929 Other chronic pain: Secondary | ICD-10-CM | POA: Diagnosis not present

## 2022-06-03 DIAGNOSIS — M25561 Pain in right knee: Secondary | ICD-10-CM

## 2022-06-03 DIAGNOSIS — R251 Tremor, unspecified: Secondary | ICD-10-CM | POA: Diagnosis not present

## 2022-06-03 LAB — VITAMIN D 25 HYDROXY (VIT D DEFICIENCY, FRACTURES): Vit D, 25-Hydroxy: 31.6 ng/mL (ref 30.0–100.0)

## 2022-06-03 LAB — MICROALBUMIN / CREATININE URINE RATIO
Creatinine,U: 67.1 mg/dL
Microalb Creat Ratio: 15.2 mg/g (ref 0.0–30.0)
Microalb, Ur: 10.2 mg/dL — ABNORMAL HIGH (ref 0.0–1.9)

## 2022-06-03 LAB — HEMOGLOBIN A1C: Hgb A1c MFr Bld: 6.3 % (ref 4.6–6.5)

## 2022-06-03 LAB — SPECIMEN STATUS REPORT

## 2022-06-03 LAB — TSH: TSH: 2.24 u[IU]/mL (ref 0.35–5.50)

## 2022-06-03 MED ORDER — TRAMADOL HCL 50 MG PO TABS
ORAL_TABLET | ORAL | 0 refills | Status: DC
Start: 2022-06-03 — End: 2022-06-26

## 2022-06-03 NOTE — Assessment & Plan Note (Signed)
Chronic issue.  Check A1c.  Continue Ozempic 2 mg weekly and metformin 1000 mg twice daily.

## 2022-06-03 NOTE — Progress Notes (Signed)
Walter Alar, MD Phone: [phone]  Walter Salas is a 67 y.o. male who presents today for f/u.  DIABETES Disease Monitoring: Blood Sugar ranges-112-130 Polyuria/phagia/dipsia- no       Medications: Compliance- taking metformin, ozempic Hypoglycemic symptoms- no  Chronic pain: Patient is on tramadol.  Notes this works well.  Does make him drowsy for about an hour after taking it so he does not drive while drowsy.  He takes this twice daily.  Atrial fibrillation: Patient notes he is scheduled for cardioversion on Friday.  Right knee pain: Patient notes about a month ago he fell down 4 stairs.  He started to feel himself fall so he jumped and landed on a concrete walkway.  He did not hit his head.  He had a fracture in his left foot which was followed by podiatry and he has been released from wearing a boot for that.  He notes after the fall his right knee has been swollen and painful.  Notes the knee has been painful along the medial joint line.  Tremor: Patient notes this occurs in his left hand.  It is intermittent.  It occurs at rest and with movements.  It has been going on for a month or so.   Social History   Tobacco Use  Smoking Status Every Day   Packs/day: 1.00   Years: 50.00   Additional pack years: 0.00   Total pack years: 50.00   Types: Cigarettes   Last attempt to quit: 02/15/2018   Years since quitting: 4.2  Smokeless Tobacco Never    Current Outpatient Medications on File Prior to Visit  Medication Sig Dispense Refill   amiodarone (PACERONE) 200 MG tablet Take 2 tablets by mouth twice a day for 2 weeks then reduce to 1 tablet by mouth twice a day (Patient taking differently: Take 200 mg by mouth 2 (two) times daily.) 290 tablet 3   amLODipine (NORVASC) 2.5 MG tablet TAKE ONE TABLET (2.5 MG) BY MOUTH EVERY DAY 90 tablet 0   apixaban (ELIQUIS) 5 MG TABS tablet Take 5 mg by mouth 2 (two) times daily.     aspirin 81 MG EC tablet Take 81 mg by mouth daily at 12  noon.     atorvastatin (LIPITOR) 80 MG tablet Take 80 mg by mouth daily.     chlorthalidone (HYGROTON) 50 MG tablet Take 25 mg by mouth daily.     clopidogrel (PLAVIX) 75 MG tablet TAKE 1 TABLET BY MOUTH DAILY. 90 tablet 1   DULoxetine (CYMBALTA) 20 MG capsule Take 20 mg by mouth daily.     ezetimibe (ZETIA) 10 MG tablet TAKE 1 TABLET BY MOUTH DAILY. 90 tablet 3   fenofibrate (TRICOR) 145 MG tablet Take 1 tablet (145 mg total) by mouth daily. 30 tablet 0   furosemide (LASIX) 20 MG tablet Take 1 tablet (20 mg total) by mouth daily as needed (for weight gain > 3 lbs over night or > 5 lbs/week.). 30 tablet 4   glucose blood test strip Check once daily, E11.9 100 each 12   losartan (COZAAR) 25 MG tablet Take 25 mg by mouth daily.     metFORMIN (GLUCOPHAGE) 1000 MG tablet Take 1,000 mg by mouth 2 (two) times daily with a meal.     methyl salicylate liquid Apply 1 application topically as needed for muscle pain.     nitroGLYCERIN (NITROSTAT) 0.4 MG SL tablet Place 1 tablet (0.4 mg total) under the tongue every 5 (five) minutes as needed  for chest pain. May take up to 3 doses. 30 tablet 0   omeprazole (PRILOSEC) 20 MG capsule Take 20 mg by mouth daily.     polyvinyl alcohol (LIQUIFILM TEARS) 1.4 % ophthalmic solution Place 1 drop into both eyes as needed for dry eyes.      potassium chloride SA (K-DUR,KLOR-CON) 20 MEQ tablet Take 1 tablet (20 mEq total) by mouth daily. 90 tablet 3   pregabalin (LYRICA) 75 MG capsule Take 75 mg by mouth 3 (three) times daily.     Semaglutide, 2 MG/DOSE, 8 MG/3ML SOPN Inject 2 mg as directed once a week. 9 mL 3   tamsulosin (FLOMAX) 0.4 MG CAPS capsule TAKE 1 CAPSULE BY MOUTH ONCE DAILY 30 capsule 3   VASCEPA 1 g capsule Take 2 g by mouth 2 (two) times daily.     vitamin B-12 (CYANOCOBALAMIN) 1000 MCG tablet Take 1 tablet (1,000 mcg total) by mouth daily. 30 tablet 0   No current facility-administered medications on file prior to visit.     ROS see history of  present illness  Objective  Physical Exam Vitals:   06/03/22 0922  BP: 118/66  Pulse: 77  Temp: 98.3 F (36.8 C)  SpO2: 98%    BP Readings from Last 3 Encounters:  06/03/22 118/66  06/01/22 128/68  04/15/22 130/60   Wt Readings from Last 3 Encounters:  06/03/22 212 lb 9.6 oz (96.4 kg)  06/01/22 214 lb (97.1 kg)  04/15/22 214 lb 3.2 oz (97.2 kg)    Physical Exam Constitutional:      General: He is not in acute distress.    Appearance: He is not diaphoretic.  Cardiovascular:     Rate and Rhythm: Normal rate and regular rhythm.     Heart sounds: Normal heart sounds.  Pulmonary:     Effort: Pulmonary effort is normal.     Breath sounds: Normal breath sounds.  Musculoskeletal:     Comments: Right knee with no swelling, warmth, or erythema, there is no tenderness of the knee, negative McMurray's  Skin:    General: Skin is warm and dry.  Neurological:     Mental Status: He is alert.      Assessment/Plan: Please see individual problem list.  Type 2 diabetes mellitus without complication, without long-term current use of insulin (HCC) Assessment & Plan: Chronic issue.  Check A1c.  Continue Ozempic 2 mg weekly and metformin 1000 mg twice daily.  Orders: -     Microalbumin / creatinine urine ratio -     Hemoglobin A1c  Chronic low back pain, unspecified back pain laterality, unspecified whether sciatica present Assessment & Plan: Chronic issue.  Stable.  He can continue tramadol 100 mg every 8 hours as needed.  He will monitor his drowsiness and will not drive if he is drowsy while taking this.  Orders: -     traMADol HCl; TAKE 2 TABLETS BY MOUTH EVERY 8 HOURS ASNEEDED FOR PAIN  Dispense: 90 tablet; Refill: 0  Acute pain of right knee Assessment & Plan: Acute injury to right knee.  We will get an x-ray to evaluate for underlying fracture.  If negative we may need to consider referral to orthopedics or sports medicine for further evaluation of possible meniscal  injury or other type of injury.  Orders: -     DG Knee Complete 4 Views Right; Future  Tremor Assessment & Plan: None noted on exam today.  Plan to check TSH.  Recent electrolytes acceptable.  Possibly  related to medication and we will review those as potential causes once his lab work returns.  If no obvious cause on labs or medication would refer to neurology.  Orders: -     TSH    Return in about 3 months (around 09/03/2022).   Walter Alar, MD [facility]

## 2022-06-03 NOTE — Assessment & Plan Note (Addendum)
None noted on exam today.  Plan to check TSH.  Recent electrolytes acceptable.  Possibly related to medication and we will review those as potential causes once his lab work returns.  If no obvious cause on labs or medication would refer to neurology.

## 2022-06-03 NOTE — Assessment & Plan Note (Signed)
Acute injury to right knee.  We will get an x-ray to evaluate for underlying fracture.  If negative we may need to consider referral to orthopedics or sports medicine for further evaluation of possible meniscal injury or other type of injury.

## 2022-06-03 NOTE — Assessment & Plan Note (Deleted)
Of note patient gets his statin from the [place].

## 2022-06-03 NOTE — Assessment & Plan Note (Signed)
Chronic issue.  Stable.  He can continue tramadol 100 mg every 8 hours as needed.  He will monitor his drowsiness and will not drive if he is drowsy while taking this.

## 2022-06-04 ENCOUNTER — Telehealth: Payer: Self-pay

## 2022-06-04 NOTE — Telephone Encounter (Signed)
-----   Message from Glori Luis, MD sent at 06/04/2022 10:51 AM EDT ----- Please let the patient know that his x-ray did not reveal any fracture.  There was a small joint effusion noted.  As he has continued to have pain it may be good to have him see sports medicine or orthopedics for his knee pain.  I can place referral if he is willing.

## 2022-06-04 NOTE — Progress Notes (Signed)
Message left, Procedure scheduled for 1230, Please arrive at the hospital at 1130, NPO after midnight on Thursday, May take meds with sips of water in the AM, please have transportation for home post procedure, and someone to stay with pt for approximately 24 hours after  Pt informed to notify doctors office if they haven't been taking eliquis as prescribed or have missed doses, also to inform dr's office if they didn't hold ozempic  May call with questions at [phone]

## 2022-06-04 NOTE — Telephone Encounter (Signed)
Left message to call the office back regarding lab results and xray results.

## 2022-06-05 ENCOUNTER — Encounter (HOSPITAL_COMMUNITY)
Admission: RE | Disposition: A | Discharge status: Home / Self Care | Payer: Self-pay | Source: Home / Self Care | Attending: Cardiology

## 2022-06-05 ENCOUNTER — Encounter (HOSPITAL_COMMUNITY): Payer: Self-pay | Admitting: Cardiology

## 2022-06-05 ENCOUNTER — Other Ambulatory Visit: Payer: Self-pay

## 2022-06-05 ENCOUNTER — Other Ambulatory Visit: Payer: Self-pay | Admitting: Family Medicine

## 2022-06-05 ENCOUNTER — Ambulatory Visit (HOSPITAL_COMMUNITY): Payer: Medicare Other | Admitting: Anesthesiology

## 2022-06-05 ENCOUNTER — Ambulatory Visit (HOSPITAL_BASED_OUTPATIENT_CLINIC_OR_DEPARTMENT_OTHER): Payer: Medicare Other | Admitting: Anesthesiology

## 2022-06-05 ENCOUNTER — Ambulatory Visit (HOSPITAL_COMMUNITY)
Admission: RE | Admit: 2022-06-05 | Discharge: 2022-06-05 | Disposition: A | Discharge status: Home / Self Care | Payer: Medicare Other | Attending: Cardiology | Admitting: Cardiology

## 2022-06-05 DIAGNOSIS — I252 Old myocardial infarction: Secondary | ICD-10-CM | POA: Diagnosis not present

## 2022-06-05 DIAGNOSIS — Z7901 Long term (current) use of anticoagulants: Secondary | ICD-10-CM | POA: Insufficient documentation

## 2022-06-05 DIAGNOSIS — I509 Heart failure, unspecified: Secondary | ICD-10-CM | POA: Diagnosis not present

## 2022-06-05 DIAGNOSIS — Z8249 Family history of ischemic heart disease and other diseases of the circulatory system: Secondary | ICD-10-CM | POA: Insufficient documentation

## 2022-06-05 DIAGNOSIS — R251 Tremor, unspecified: Secondary | ICD-10-CM

## 2022-06-05 DIAGNOSIS — Z6830 Body mass index (BMI) 30.0-30.9, adult: Secondary | ICD-10-CM | POA: Diagnosis not present

## 2022-06-05 DIAGNOSIS — I5032 Chronic diastolic (congestive) heart failure: Secondary | ICD-10-CM | POA: Insufficient documentation

## 2022-06-05 DIAGNOSIS — I110 Hypertensive heart disease with heart failure: Secondary | ICD-10-CM | POA: Insufficient documentation

## 2022-06-05 DIAGNOSIS — Z7982 Long term (current) use of aspirin: Secondary | ICD-10-CM | POA: Diagnosis not present

## 2022-06-05 DIAGNOSIS — F32A Depression, unspecified: Secondary | ICD-10-CM | POA: Diagnosis not present

## 2022-06-05 DIAGNOSIS — F17210 Nicotine dependence, cigarettes, uncomplicated: Secondary | ICD-10-CM

## 2022-06-05 DIAGNOSIS — D6869 Other thrombophilia: Secondary | ICD-10-CM | POA: Insufficient documentation

## 2022-06-05 DIAGNOSIS — G4733 Obstructive sleep apnea (adult) (pediatric): Secondary | ICD-10-CM | POA: Diagnosis not present

## 2022-06-05 DIAGNOSIS — I4891 Unspecified atrial fibrillation: Secondary | ICD-10-CM | POA: Diagnosis not present

## 2022-06-05 DIAGNOSIS — K219 Gastro-esophageal reflux disease without esophagitis: Secondary | ICD-10-CM | POA: Insufficient documentation

## 2022-06-05 DIAGNOSIS — Z7902 Long term (current) use of antithrombotics/antiplatelets: Secondary | ICD-10-CM | POA: Diagnosis not present

## 2022-06-05 DIAGNOSIS — E785 Hyperlipidemia, unspecified: Secondary | ICD-10-CM | POA: Diagnosis not present

## 2022-06-05 DIAGNOSIS — Z955 Presence of coronary angioplasty implant and graft: Secondary | ICD-10-CM | POA: Insufficient documentation

## 2022-06-05 DIAGNOSIS — Z7984 Long term (current) use of oral hypoglycemic drugs: Secondary | ICD-10-CM | POA: Insufficient documentation

## 2022-06-05 DIAGNOSIS — I4819 Other persistent atrial fibrillation: Secondary | ICD-10-CM | POA: Diagnosis not present

## 2022-06-05 DIAGNOSIS — I10 Essential (primary) hypertension: Secondary | ICD-10-CM

## 2022-06-05 DIAGNOSIS — E669 Obesity, unspecified: Secondary | ICD-10-CM | POA: Diagnosis not present

## 2022-06-05 DIAGNOSIS — E119 Type 2 diabetes mellitus without complications: Secondary | ICD-10-CM | POA: Diagnosis not present

## 2022-06-05 DIAGNOSIS — I5030 Unspecified diastolic (congestive) heart failure: Secondary | ICD-10-CM | POA: Diagnosis not present

## 2022-06-05 DIAGNOSIS — Z79899 Other long term (current) drug therapy: Secondary | ICD-10-CM | POA: Diagnosis not present

## 2022-06-05 DIAGNOSIS — M25561 Pain in right knee: Secondary | ICD-10-CM

## 2022-06-05 DIAGNOSIS — I2510 Atherosclerotic heart disease of native coronary artery without angina pectoris: Secondary | ICD-10-CM | POA: Insufficient documentation

## 2022-06-05 DIAGNOSIS — F17200 Nicotine dependence, unspecified, uncomplicated: Secondary | ICD-10-CM | POA: Diagnosis not present

## 2022-06-05 HISTORY — PX: CARDIOVERSION: SHX1299

## 2022-06-05 LAB — GLUCOSE, CAPILLARY
Glucose-Capillary: 98 mg/dL (ref 70–99)
Glucose-Capillary: 82 mg/dL (ref 70–99)

## 2022-06-05 SURGERY — CARDIOVERSION
Anesthesia: General

## 2022-06-05 MED ORDER — LIDOCAINE 2% (20 MG/ML) 5 ML SYRINGE
INTRAMUSCULAR | Status: DC | PRN
  Administered 2022-06-05: 50 mg via INTRAVENOUS

## 2022-06-05 MED ORDER — SODIUM CHLORIDE 0.9 % IV SOLN: INTRAVENOUS | Status: DC

## 2022-06-05 MED ORDER — PROPOFOL 10 MG/ML IV BOLUS
INTRAVENOUS | Status: DC | PRN
  Administered 2022-06-05: 80 mg via INTRAVENOUS

## 2022-06-05 SURGICAL SUPPLY — 1 items: ELECT DEFIB PAD ADLT CADENCE (PAD) ×1 IMPLANT

## 2022-06-05 NOTE — Pre-Procedure Instructions (Signed)
OK to give patient soda per Dr. Cristal Deer

## 2022-06-05 NOTE — Discharge Instructions (Signed)

## 2022-06-05 NOTE — CV Procedure (Signed)
Procedure:   DCCV  Indication:  Symptomatic atrial fibrillation  Procedure Note:  The patient signed informed consent.  They have had had therapeutic anticoagulation with apixaban greater than 3 weeks.  Anesthesia was administered by Dr. Malen Gauze.  Patient received 50 mg IV lidocaine and 80 mg IV propofol.Adequate airway was maintained throughout and vital followed per protocol.  They were cardioverted x 1 with 150J of biphasic synchronized energy.  They converted to NSR.  There were no apparent complications.  The patient had normal neuro status and respiratory status post procedure with vitals stable as recorded elsewhere.    Follow up:  They will continue on current medical therapy and follow up with cardiology as scheduled.  Jodelle Red, MD PhD 06/05/2022 2:16 PM

## 2022-06-05 NOTE — OR Nursing (Signed)
Per MD, no EKG needed for 5/10 chest pain. Patient states, "It's chronic."

## 2022-06-05 NOTE — Transfer of Care (Signed)
Immediate Anesthesia Transfer of Care Note  Patient: Walter Salas  Procedure(s) Performed: CARDIOVERSION  Patient Location: Cath Lab  Anesthesia Type:MAC  Level of Consciousness: sedated  Airway & Oxygen Therapy: Patient Spontanous Breathing  Post-op Assessment: Report given to RN and Post -op Vital signs reviewed and stable  Post vital signs: Reviewed and stable  Last Vitals:  Vitals Value Taken Time  BP    Temp    Pulse 50 06/05/22 1409  Resp 19 06/05/22 1409  SpO2 94 % 06/05/22 1409  Vitals shown include unvalidated device data.  Last Pain:  Vitals:   06/05/22 1128  TempSrc:   PainSc: 5          Complications: No notable events documented.

## 2022-06-05 NOTE — Interval H&P Note (Signed)
History and Physical Interval Note:  06/05/2022 1:54 PM  Walter Salas  has presented today for surgery, with the diagnosis of afib.  The various methods of treatment have been discussed with the patient and family. After consideration of risks, benefits and other options for treatment, the patient has consented to  Procedure(s): CARDIOVERSION (N/A) as a surgical intervention.  The patient's history has been reviewed, patient examined, no change in status, stable for surgery.  I have reviewed the patient's chart and labs.  Questions were answered to the patient's satisfaction.     [name]

## 2022-06-05 NOTE — Anesthesia Postprocedure Evaluation (Signed)
Anesthesia Post Note  Patient: Walter Salas  Procedure(s) Performed: CARDIOVERSION     Patient location during evaluation: PACU Anesthesia Type: General Level of consciousness: awake and alert and oriented Pain management: pain level controlled Vital Signs Assessment: post-procedure vital signs reviewed and stable Respiratory status: spontaneous breathing, nonlabored ventilation and respiratory function stable Cardiovascular status: blood pressure returned to baseline and stable Postop Assessment: no apparent nausea or vomiting Anesthetic complications: no   No notable events documented.  Last Vitals:  Vitals:   06/05/22 1430 06/05/22 1440  BP: (!) 138/92 (!) 143/72  Pulse: 70 67  Resp: 20 16  Temp:    SpO2: 94% 93%    Last Pain:  Vitals:   06/05/22 1420  TempSrc:   PainSc: 5                  Retal Tonkinson A.

## 2022-06-05 NOTE — Anesthesia Preprocedure Evaluation (Addendum)
Anesthesia Evaluation  Patient identified by MRN, date of birth, ID band Patient awake    Reviewed: Allergy & Precautions, NPO status , Patient's Chart, lab work & pertinent test results  History of Anesthesia Complications Negative for: history of anesthetic complications  Airway Mallampati: III  TM Distance: >3 FB Neck ROM: Full    Dental  (+) Dental Advisory Given   Pulmonary neg shortness of breath, sleep apnea , neg COPD, neg recent URI, Current Smoker and Patient abstained from smoking.   Pulmonary exam normal breath sounds clear to auscultation       Cardiovascular hypertension, Pt. on medications + CAD, + Past MI, + Cardiac Stents and +CHF  (-) CABG + dysrhythmias Atrial Fibrillation  Rhythm:Regular Rate:Normal  HLD, thoracic aortic aneurysm  TTE 02/17/2022: IMPRESSIONS     1. Left ventricular ejection fraction, by estimation, is 55 to 60%. The  left ventricle has normal function. The left ventricle has no regional  wall motion abnormalities. There is mild asymmetric left ventricular  hypertrophy of the basal-septal segment.  Left ventricular diastolic parameters are consistent with Grade II  diastolic dysfunction (pseudonormalization).   2. Right ventricular systolic function is normal. The right ventricular  size is normal. Tricuspid regurgitation signal is inadequate for assessing  PA pressure.   3. Left atrial size was mildly dilated.   4. The mitral valve is normal in structure. No evidence of mitral valve  regurgitation. No evidence of mitral stenosis.   5. The aortic valve is normal in structure. Aortic valve regurgitation is  not visualized. No aortic stenosis is present.   6. There is borderline dilatation of the aortic root, measuring 39 mm.  There is mild dilatation of the ascending aorta, measuring 40 mm.   7. The inferior vena cava is normal in size with greater than 50%  respiratory variability,  suggesting right atrial pressure of 3 mmHg.     Neuro/Psych neg Seizures PSYCHIATRIC DISORDERS  Depression    Chronic pain with spinal cord stimulator, h/o SDH 2014  Neuromuscular disease CVA (02/2022), No Residual Symptoms    GI/Hepatic Neg liver ROS,GERD  Medicated,,diverticulosis   Endo/Other  diabetes, Type 2    Renal/GU negative Renal ROS     Musculoskeletal  (+) Arthritis ,    Abdominal  (+) + obese  Peds  Hematology negative hematology ROS (+)   Anesthesia Other Findings Last semiglutide: 05/24/2022  On Plavix and Eliquis  Reproductive/Obstetrics                             Anesthesia Physical Anesthesia Plan  ASA: 3  Anesthesia Plan: General   Post-op Pain Management:    Induction: Intravenous  PONV Risk Score and Plan: 1 and Treatment may vary due to age or medical condition  Airway Management Planned: Mask and Natural Airway  Additional Equipment:   Intra-op Plan:   Post-operative Plan:   Informed Consent: I have reviewed the patients History and Physical, chart, labs and discussed the procedure including the risks, benefits and alternatives for the proposed anesthesia with the patient or authorized representative who has indicated his/her understanding and acceptance.     Dental advisory given  Plan Discussed with: CRNA and Anesthesiologist  Anesthesia Plan Comments: (Patient has remote for SCS in the car. His friend will go get it to turn it off prior to the procedure.  Risks of general anesthesia discussed including, but not limited to, sore throat, hoarse voice,  chipped/damaged teeth, injury to vocal cords, nausea and vomiting, allergic reactions, lung infection, heart attack, stroke, and death. All questions answered. )        Anesthesia Quick Evaluation

## 2022-06-09 ENCOUNTER — Encounter (HOSPITAL_COMMUNITY): Payer: Self-pay | Admitting: Cardiology

## 2022-06-09 DIAGNOSIS — K31A0 Gastric intestinal metaplasia, unspecified: Secondary | ICD-10-CM | POA: Diagnosis not present

## 2022-06-09 DIAGNOSIS — Z86010 Personal history of colonic polyps: Secondary | ICD-10-CM | POA: Diagnosis not present

## 2022-06-09 DIAGNOSIS — K2100 Gastro-esophageal reflux disease with esophagitis, without bleeding: Secondary | ICD-10-CM | POA: Diagnosis not present

## 2022-06-09 DIAGNOSIS — Z7901 Long term (current) use of anticoagulants: Secondary | ICD-10-CM | POA: Diagnosis not present

## 2022-06-11 DIAGNOSIS — M545 Low back pain, unspecified: Secondary | ICD-10-CM | POA: Insufficient documentation

## 2022-06-11 DIAGNOSIS — F32A Depression, unspecified: Secondary | ICD-10-CM | POA: Insufficient documentation

## 2022-06-17 ENCOUNTER — Telehealth: Payer: Self-pay | Admitting: *Deleted

## 2022-06-17 NOTE — Telephone Encounter (Signed)
Pre-op team,  Please contact requesting provider regarding scheduling for colonoscopy based on pharm D notes:  "Pt recently underwent cardioversion on 06/05/22, cannot hold anticoag for 4 weeks after. Pending afib ablation on 10/06/22, cannot hold anticoag for 3 weeks before and 3 months after. Colonoscopy currently scheduled on 10/3 which will need to be moved (July or August would be ok time-wise). At that time, would recommend 1 day Eliquis hold if possible given elevated CV risk and normal renal function. If 2 day hold is required, would need to confirm with MD."   Thank you!  DW

## 2022-06-17 NOTE — Telephone Encounter (Signed)
Please advise holding Eliquis prior to colonoscopy scheduled for 10/15/2022.  Thank you!  DW

## 2022-06-17 NOTE — Telephone Encounter (Signed)
Patient with diagnosis of afib on Eliquis for anticoagulation.    Procedure: colonoscopy Date of procedure: 10/15/22  CHA2DS2-VASc Score = 7  This indicates a 11.2% annual risk of stroke. The patient's score is based upon: CHF History: 1 HTN History: 1 Diabetes History: 1 Stroke History: 2 Vascular Disease History: 1 Age Score: 1 Gender Score: 0   CVA Feb 2023, found to be in afib at that time.  CrCl 60mL/min Platelet count 223K  Pt recently underwent cardioversion on 06/05/22, cannot hold anticoag for 4 weeks after. Pending afib ablation on 10/06/22, cannot hold anticoag for 3 weeks before and 3 months after. Colonoscopy currently scheduled on 10/3 which will need to be moved (July or August would be ok time-wise). At that time, would recommend 1 day Eliquis hold if possible given elevated CV risk and normal renal function. If 2 day hold is required, would need to confirm with MD.  **This guidance is not considered finalized until pre-operative APP has relayed final recommendations.**

## 2022-06-17 NOTE — Telephone Encounter (Signed)
I have faxed over cardiac clearance updates to requesting provider's office.

## 2022-06-17 NOTE — Telephone Encounter (Signed)
   Pre-operative Risk Assessment    Patient Name: Walter Salas  DOB: May 29, 1955 MRN: [id number]      Request for Surgical Clearance    Procedure:   COLONOSCOPY  Date of Surgery:  Clearance 10/15/22                                 Surgeon:  DR. Elfredia Nevins Surgeon's Group or Practice Name:  [facility] Phone number:   Fax number:  [phone]   Type of Clearance Requested:   MEDICAL & - Pharmacy:  Hold Apixaban (Eliquis) PER CLEARANCE, CrCl >60 2 days, CrCl 30-59 3 days, CrCl 15-29 4 DAYS   Type of Anesthesia:  Not Indicated   Additional requests/questions:    Wilhemina Cash   06/17/2022, 8:50 AM

## 2022-06-18 DIAGNOSIS — E785 Hyperlipidemia, unspecified: Secondary | ICD-10-CM | POA: Diagnosis not present

## 2022-06-18 DIAGNOSIS — M25561 Pain in right knee: Secondary | ICD-10-CM | POA: Diagnosis not present

## 2022-06-18 DIAGNOSIS — E1169 Type 2 diabetes mellitus with other specified complication: Secondary | ICD-10-CM | POA: Diagnosis not present

## 2022-06-18 DIAGNOSIS — S83206A Unspecified tear of unspecified meniscus, current injury, right knee, initial encounter: Secondary | ICD-10-CM | POA: Diagnosis not present

## 2022-06-22 ENCOUNTER — Other Ambulatory Visit: Payer: Self-pay | Admitting: Student

## 2022-06-22 DIAGNOSIS — M25561 Pain in right knee: Secondary | ICD-10-CM

## 2022-06-22 DIAGNOSIS — S83206A Unspecified tear of unspecified meniscus, current injury, right knee, initial encounter: Secondary | ICD-10-CM

## 2022-06-26 ENCOUNTER — Other Ambulatory Visit: Payer: Self-pay | Admitting: Family Medicine

## 2022-06-26 DIAGNOSIS — G8929 Other chronic pain: Secondary | ICD-10-CM

## 2022-06-26 NOTE — Telephone Encounter (Signed)
Refilled: 06/03/2022 Last OV: 06/03/2022 Next OV: 09/09/2022

## 2022-06-29 ENCOUNTER — Ambulatory Visit
Admission: RE | Admit: 2022-06-29 | Discharge: 2022-06-29 | Disposition: A | Discharge status: Home / Self Care | Payer: Medicare Other | Source: Ambulatory Visit | Attending: Student | Admitting: Student

## 2022-06-29 DIAGNOSIS — M25561 Pain in right knee: Secondary | ICD-10-CM | POA: Diagnosis not present

## 2022-06-29 DIAGNOSIS — S83206A Unspecified tear of unspecified meniscus, current injury, right knee, initial encounter: Secondary | ICD-10-CM | POA: Diagnosis not present

## 2022-06-29 DIAGNOSIS — S83241A Other tear of medial meniscus, current injury, right knee, initial encounter: Secondary | ICD-10-CM | POA: Diagnosis not present

## 2022-07-10 ENCOUNTER — Other Ambulatory Visit: Payer: Self-pay

## 2022-07-10 ENCOUNTER — Encounter: Payer: Self-pay | Admitting: Emergency Medicine

## 2022-07-10 ENCOUNTER — Emergency Department
Admission: EM | Admit: 2022-07-10 | Discharge: 2022-07-10 | Disposition: A | Discharge status: Home / Self Care | Payer: Medicare Other | Attending: Emergency Medicine | Admitting: Emergency Medicine

## 2022-07-10 DIAGNOSIS — E119 Type 2 diabetes mellitus without complications: Secondary | ICD-10-CM | POA: Diagnosis not present

## 2022-07-10 DIAGNOSIS — Z85828 Personal history of other malignant neoplasm of skin: Secondary | ICD-10-CM | POA: Diagnosis not present

## 2022-07-10 DIAGNOSIS — I2510 Atherosclerotic heart disease of native coronary artery without angina pectoris: Secondary | ICD-10-CM | POA: Diagnosis not present

## 2022-07-10 DIAGNOSIS — Z85858 Personal history of malignant neoplasm of other endocrine glands: Secondary | ICD-10-CM | POA: Diagnosis not present

## 2022-07-10 DIAGNOSIS — I5030 Unspecified diastolic (congestive) heart failure: Secondary | ICD-10-CM | POA: Insufficient documentation

## 2022-07-10 DIAGNOSIS — Z7902 Long term (current) use of antithrombotics/antiplatelets: Secondary | ICD-10-CM | POA: Insufficient documentation

## 2022-07-10 DIAGNOSIS — X58XXXA Exposure to other specified factors, initial encounter: Secondary | ICD-10-CM | POA: Insufficient documentation

## 2022-07-10 DIAGNOSIS — Z79899 Other long term (current) drug therapy: Secondary | ICD-10-CM | POA: Diagnosis not present

## 2022-07-10 DIAGNOSIS — S0990XA Unspecified injury of head, initial encounter: Secondary | ICD-10-CM | POA: Diagnosis present

## 2022-07-10 DIAGNOSIS — Z7982 Long term (current) use of aspirin: Secondary | ICD-10-CM | POA: Insufficient documentation

## 2022-07-10 DIAGNOSIS — S0083XA Contusion of other part of head, initial encounter: Secondary | ICD-10-CM | POA: Diagnosis not present

## 2022-07-10 DIAGNOSIS — Z7984 Long term (current) use of oral hypoglycemic drugs: Secondary | ICD-10-CM | POA: Diagnosis not present

## 2022-07-10 DIAGNOSIS — I110 Hypertensive heart disease with heart failure: Secondary | ICD-10-CM | POA: Insufficient documentation

## 2022-07-10 DIAGNOSIS — T148XXA Other injury of unspecified body region, initial encounter: Secondary | ICD-10-CM

## 2022-07-10 DIAGNOSIS — Z7901 Long term (current) use of anticoagulants: Secondary | ICD-10-CM | POA: Diagnosis not present

## 2022-07-10 MED ORDER — LIDOCAINE-EPINEPHRINE 1 %-1:100000 IJ SOLN
20.0000 mL | Freq: Once | INTRAMUSCULAR | Status: AC
  Administered 2022-07-10: 20 mL
  Filled 2022-07-10: qty 20
  Filled 2022-07-10: qty 1

## 2022-07-10 NOTE — ED Triage Notes (Signed)
Patient reports he fell a few weeks ago and sustained a hematoma to his forehead.  Patient reports that tonight, it busted open.  Patient denies trauma tonight. Bleeding controlled.  Patient on eliquis.

## 2022-07-10 NOTE — Discharge Instructions (Signed)
You may remove bandage later tonight and cover with Band-Aid.  Return to the ER for recurrent or worsening symptoms, persistent vomiting, difficulty breathing, fainting or other concerns.

## 2022-07-10 NOTE — ED Provider Notes (Signed)
[facility] Provider Note    Event Date/Time   First MD Initiated Contact with Patient 07/10/22 0425     (approximate)   History   Head Laceration   HPI  Walter Salas is a 67 y.o. male who presents to the ED from home with a chief complaint of bleeding wound.  Patient states he fell a few weeks ago and sustained a forehead hematoma.  Has an upcoming appointment to see the surgeon.  He is on Eliquis for atrial fibrillation.  Tonight he was asleep and the hematoma "busted open".  Denies any injury, fall or trauma tonight.  Bleeding controlled.  Voices no complaints of headache, vision changes, neck pain, chest pain, shortness of breath, abdominal pain, nausea, vomiting or dizziness.     Past Medical History   Past Medical History:  Diagnosis Date   (HFpEF) heart failure with preserved ejection fraction (HCC)    a. 2018 Echo: EF 60-65%, normal wall motion, Gr1DD, mild aortic valve calcification; b. 12/2017 Echo: EF 60-65%, no rwma, Ao root 4.3cm, Asc Ao 4.4 cm, mildly dil LA.   Agent orange exposure    Anginal pain (HCC)    Arthritis    CAD (coronary artery disease)    a. s/p PCI/DES to the LCx and RCA in 2014; b. 05/2016 MV: EF 55-65%. Small, mild defect in basal anterolateral location->infarct w/o ischemia.   Cancer [facility])    skin   Chronic cough    Chronic pain    Colon polyp    Depression    Diabetes mellitus without complication (HCC)    Diverticulosis    GERD (gastroesophageal reflux disease)    History of kidney stones    History of stomach ulcers    History of subdural hematoma 12/14/2012   HOH (hard of hearing)    Hx of laminectomy 1999   Hyperlipidemia    Hypertension    Lacunar infarct, acute (HCC)    Myocardial infarction (HCC)    Sleep apnea    Subdural hematoma (HCC) 12/14/2012   Syncope    a. Event monitor 2018: NSR with first-degree AV block, no significant arrhythmias or pauses; b.  Myoview showed a small in size, mild in  severity fixed basal and anterior lateral defect, no ischemia, LVEF 55 to 65%, low risk study   Thoracic aortic aneurysm (HCC)    a. TTE 2018: Mildly dil Ao root- 4.2 cm; b. CTA aorta 6/18: Asc Ao aneurysmal dil w max diam of 4.3 cm; b. 12/2019 CTA Chest: stable 4.3cm thor Ao Aneurysm.   Ulcer      Active Problem List   Patient Active Problem List   Diagnosis Date Noted   Right knee pain 06/03/2022   Tremor 06/03/2022   Benign neoplasm of adrenal gland 11/28/2021   Causalgia of lower limb 11/28/2021   Degeneration of lumbar or lumbosacral intervertebral disc 11/28/2021   Esophageal reflux 11/28/2021   Hypersomnia with sleep apnea 11/28/2021   Cervical radiculopathy 11/24/2021   Diarrhea 09/30/2021   Vomiting 08/06/2021   Weight loss 08/06/2021   OSA (obstructive sleep apnea) 03/24/2021   Carpal tunnel syndrome 03/24/2021   History of CVA (cerebrovascular accident) 02/27/2021   Paresthesia of right arm 02/17/2021   Acute pain of right shoulder 02/17/2021   History of rotator cuff surgery- right shoulder 2003 02/17/2021   Degenerative joint disease of right acromioclavicular joint 02/17/2021   Right shoulder tendonitis 02/17/2021   Aortic atherosclerosis (HCC) 07/22/2020   Hyperlipidemia associated  with type 2 diabetes mellitus (HCC) 07/04/2020   Nicotine dependence, cigarettes, uncomplicated 10/10/2019   Thoracic aortic aneurysm without rupture (HCC) 01/24/2018   H. pylori infection 03/28/2017   B12 deficiency 03/26/2017   Thyroid nodule 11/11/2016   History of colon polyps 11/11/2016   Onychomycosis 02/11/2016   Chronic back pain 05/15/2015   Diastasis recti 03/21/2014   Hereditary and idiopathic peripheral neuropathy 08/14/2013   Vitamin D deficiency 08/14/2013   Diabetes mellitus, type 2 (HCC) 04/28/2013   Essential hypertension 04/28/2013   Coronary artery disease 04/28/2013     Past Surgical History   Past Surgical History:  Procedure Laterality Date   BACK  SURGERY  1993, 1994, 1997, 1998   CARDIOVERSION N/A 06/05/2022   Procedure: CARDIOVERSION;  Surgeon: Jodelle Red, MD;  Location: [facility] INVASIVE CV LAB;  Service: Cardiovascular;  Laterality: N/A;   CARPAL TUNNEL RELEASE Bilateral 1990   CATARACT EXTRACTION W/PHACO Left 03/08/2018   Procedure: CATARACT EXTRACTION PHACO AND INTRAOCULAR LENS PLACEMENT (IOC) LEFT;  Surgeon: Galen Manila, MD;  Location: [facility] ORS;  Service: Ophthalmology;  Laterality: Left;  [place]  00:24.2 CDE 3.25 Fluid pack lot # [id number]   CATARACT EXTRACTION W/PHACO Right 05/24/2018   Procedure: CATARACT EXTRACTION PHACO AND INTRAOCULAR LENS PLACEMENT (IOC) RIGHT;  Surgeon: Galen Manila, MD;  Location: [facility];  Service: Ophthalmology;  Laterality: Right;  Diabetic - oral meds   CERVICAL FUSION  2000. 2003   CHOLECYSTECTOMY  2000   COLONOSCOPY WITH PROPOFOL N/A 02/23/2017   Procedure: COLONOSCOPY WITH PROPOFOL;  Surgeon: Christena Deem, MD;  Location: [facility] ENDOSCOPY;  Service: Endoscopy;  Laterality: N/A;   CORONARY ANGIOPLASTY WITH STENT PLACEMENT  2014   [facility], [facility]   ESOPHAGOGASTRODUODENOSCOPY (EGD) WITH PROPOFOL N/A 02/23/2017   Procedure: ESOPHAGOGASTRODUODENOSCOPY (EGD) WITH PROPOFOL;  Surgeon: Christena Deem, MD;  Location: [facility] ENDOSCOPY;  Service: Endoscopy;  Laterality: N/A;   PULSE GENERATOR IMPLANT N/A 03/04/2020   Procedure: PULSE GENERATOR PLACEMENT;  Surgeon: Lucy Chris, MD;  Location: [facility] ORS;  Service: Neurosurgery;  Laterality: N/A;  1ST CASE   SHOULDER ARTHROSCOPY Right 2012   THORACIC LAMINECTOMY FOR SPINAL CORD STIMULATOR N/A 02/26/2020   Procedure: THORACIC LAMINECTOMY FOR SPINAL CORD STIMULATOR PADDLE TRIAL;  Surgeon: Lucy Chris, MD;  Location: [facility] ORS;  Service: Neurosurgery;  Laterality: N/A;  1ST CASE   TUMOR EXCISION  2007     Home Medications   Prior to Admission medications   Medication Sig Start Date End Date Taking?  Authorizing Provider  amiodarone (PACERONE) 200 MG tablet Take 2 tablets by mouth twice a day for 2 weeks then reduce to 1 tablet by mouth twice a day Patient taking differently: Take 200 mg by mouth 2 (two) times daily. 04/15/22   Flossie Dibble, NP  amLODipine (NORVASC) 2.5 MG tablet TAKE ONE TABLET (2.5 MG) BY MOUTH EVERY DAY 04/21/22   Flossie Dibble, NP  apixaban (ELIQUIS) 5 MG TABS tablet Take 5 mg by mouth 2 (two) times daily. 04/10/22   [provider]  aspirin 81 MG EC tablet Take 81 mg by mouth daily at 12 noon. 09/26/12   [provider]  atorvastatin (LIPITOR) 80 MG tablet Take 80 mg by mouth daily.    [provider]  chlorthalidone (HYGROTON) 50 MG tablet Take 25 mg by mouth daily.    [provider]  clopidogrel (PLAVIX) 75 MG tablet TAKE 1 TABLET BY MOUTH DAILY. 07/04/21   Glori Luis,  MD  DULoxetine (CYMBALTA) 20 MG capsule Take 20 mg by mouth daily.    [provider]  ezetimibe (ZETIA) 10 MG tablet TAKE 1 TABLET BY MOUTH DAILY. 01/05/22   Eulis Foster, FNP  fenofibrate (TRICOR) 145 MG tablet Take 1 tablet (145 mg total) by mouth daily. 11/15/14   Shelia Media, MD  furosemide (LASIX) 20 MG tablet Take 1 tablet (20 mg total) by mouth daily as needed (for weight gain > 3 lbs over night or > 5 lbs/week.). 04/23/22   Riley Lam A, MD  glucose blood test strip Check once daily, E11.9 02/22/18   Glori Luis, MD  losartan (COZAAR) 25 MG tablet Take 25 mg by mouth daily.    [provider]  metFORMIN (GLUCOPHAGE) 1000 MG tablet Take 1,000 mg by mouth 2 (two) times daily with a meal.    [provider]  methyl salicylate liquid Apply 1 application topically as needed for muscle pain.    [provider]  nitroGLYCERIN (NITROSTAT) 0.4 MG SL tablet Place 1 tablet (0.4 mg total) under the tongue every 5 (five) minutes as needed for chest pain. May take up to 3 doses. 01/15/20   Glori Luis, MD  omeprazole (PRILOSEC) 20 MG capsule Take 20 mg by mouth daily.    [provider]  polyvinyl alcohol (LIQUIFILM TEARS) 1.4 % ophthalmic solution Place 1 drop into both eyes as needed for dry eyes.     [provider]  potassium chloride SA (K-DUR,KLOR-CON) 20 MEQ tablet Take 1 tablet (20 mEq total) by mouth daily. 06/25/16   End, Cristal Deer, MD  pregabalin (LYRICA) 75 MG capsule Take 75 mg by mouth 3 (three) times daily. 03/12/21   [provider]  Semaglutide, 2 MG/DOSE, 8 MG/3ML SOPN Inject 2 mg as directed once a week. 03/03/22   Glori Luis, MD  tamsulosin (FLOMAX) 0.4 MG CAPS capsule TAKE 1 CAPSULE BY MOUTH ONCE DAILY 04/21/21   Glori Luis, MD  traMADol (ULTRAM) 50 MG tablet TAKE 2 TABLETS BY MOUTH EVERY 8 HOURS ASNEEDED FOR PAIN. 06/26/22   Eulis Foster, FNP  VASCEPA 1 g capsule Take 2 g by mouth 2 (two) times daily. 05/09/22   [provider]  vitamin B-12 (CYANOCOBALAMIN) 1000 MCG tablet Take 1 tablet (1,000 mcg total) by mouth daily. 11/15/14   Shelia Media, MD     Allergies  Gabapentin, Isosorbide mononitrate er [isosorbide dinitrate], Lisinopril, and Tylenol [acetaminophen]   Family History   Family History  Problem Relation Age of Onset   Diabetes Mother    Heart disease Father    Diabetes Father    Diabetes Sister    Heart disease Sister    Diabetes Brother    Heart disease Brother    Heart disease Paternal Grandfather    Cancer Neg Hx      Physical Exam  Triage Vital Signs: ED Triage Vitals [07/10/22 0241]  Enc Vitals Group     BP 126/67     Pulse Rate 65     Resp 18     Temp 98.2 F (36.8 C)     Temp Source Oral     SpO2 97 %     Weight 205 lb (93 kg)     Height 5\' 10"  (1.778 m)     Head Circumference      Peak Flow      Pain Score 0     Pain Loc  Pain Edu?      Excl. in GC?     Updated Vital Signs: BP 133/74   Pulse 66   Temp 98.1 F (36.7 C) (Oral)   Resp 18   Ht 5\' 10"  (1.778  m)   Wt 93 kg   SpO2 98%   BMI 29.41 kg/m    General: Awake, no distress.  CV:  Good peripheral perfusion.  Resp:  Normal effort.  Abd:  No distention.  Other:  Forehead: Small hematoma with central scab.  No active bleeding.   ED Results / Procedures / Treatments  Labs (all labs ordered are listed, but only abnormal results are displayed) Labs Reviewed - No data to display   EKG  None   RADIOLOGY None   Official radiology report(s): No results found.   PROCEDURES:  Critical Care performed: No  Procedures   MEDICATIONS ORDERED IN ED: Medications  lidocaine-EPINEPHrine (XYLOCAINE W/EPI) 1 %-1:100000 (with pres) injection 20 mL (20 mLs Infiltration Given by Other 07/10/22 0542)     IMPRESSION / MDM / ASSESSMENT AND PLAN / ED COURSE  I reviewed the triage vital signs and the nursing notes.                             67 year old male presenting with bleeding hematoma.  Differential diagnosis includes but is not limited to arterial bleed, bleeding from wound which open from pressure, abrasion leading to bleeding, etc.  I personally reviewed patient's records and note a PCP office visit on 07/07/2022 I am unfortunately unable to see the details of that visit as patient is a [place] patient and I am unable to see his chart.  Patient's presentation is most consistent with acute, uncomplicated illness.  Wound currently is not bleeding and has a scab.  Surgicel, gauze and Curlex applied.  Will continue to monitor for rebleeding.  Patient voicing no complaints at this time, drinking Sprite.  Clinical Course as of 07/10/22 1610  Fri Jul 10, 2022  [id number] Addendum on chart review: Patient was monitored for 20 minutes after application of Surgicel and Kerlix gauze.  There was no rebleeding.  Patient was discharged in good and stable condition.  Strict return precautions were given.  Patient and spouse verbalized understanding and agreed with plan of care. [JS]    Clinical Course  User Index [JS] Irean Hong, MD     FINAL CLINICAL IMPRESSION(S) / ED DIAGNOSES   Final diagnoses:  Hematoma     Rx / DC Orders   ED Discharge Orders     None        Note:  This document was prepared using Dragon voice recognition software and may include unintentional dictation errors.   Irean Hong, MD 07/10/22 [phone]

## 2022-07-17 ENCOUNTER — Other Ambulatory Visit: Payer: Self-pay | Admitting: Family

## 2022-07-17 ENCOUNTER — Other Ambulatory Visit: Payer: Self-pay | Admitting: Cardiology

## 2022-07-17 DIAGNOSIS — M545 Low back pain, unspecified: Secondary | ICD-10-CM

## 2022-07-23 ENCOUNTER — Telehealth: Payer: Self-pay | Admitting: Family Medicine

## 2022-07-23 DIAGNOSIS — M545 Low back pain, unspecified: Secondary | ICD-10-CM

## 2022-07-23 NOTE — Telephone Encounter (Signed)
Prescription Request  07/23/2022  LOV: 06/03/2022  What is the name of the medication or equipment? traMADol (ULTRAM) 50 MG tablet    Have you contacted your pharmacy to request a refill? Yes   Which pharmacy would you like this sent to?  [facility] - [place], [place] - [address] [place] [address] Phone: [phone] Fax: [phone]    Patient notified that their request is being sent to the clinical staff for review and that they should receive a response within 2 business days.   Please advise at [facility] [phone]

## 2022-07-24 MED ORDER — TRAMADOL HCL 50 MG PO TABS
ORAL_TABLET | ORAL | 0 refills | Status: DC
Start: 2022-07-24 — End: 2022-08-19

## 2022-07-24 NOTE — Addendum Note (Signed)
Addended by: Birdie Sons, Terena Bohan G on: 07/24/2022 10:30 AM   Modules accepted: Orders

## 2022-07-24 NOTE — Telephone Encounter (Signed)
Sent to pharmacy 

## 2022-08-03 ENCOUNTER — Other Ambulatory Visit: Payer: Self-pay

## 2022-08-03 ENCOUNTER — Emergency Department
Admission: EM | Admit: 2022-08-03 | Discharge: 2022-08-03 | Disposition: A | Discharge status: Home / Self Care | Payer: Medicare Other | Attending: Emergency Medicine | Admitting: Emergency Medicine

## 2022-08-03 DIAGNOSIS — R1013 Epigastric pain: Secondary | ICD-10-CM | POA: Diagnosis not present

## 2022-08-03 DIAGNOSIS — R112 Nausea with vomiting, unspecified: Secondary | ICD-10-CM

## 2022-08-03 LAB — CBC
HCT: 42.8 % (ref 39.0–52.0)
Hemoglobin: 14.7 g/dL (ref 13.0–17.0)
MCH: 28.2 pg (ref 26.0–34.0)
MCHC: 34.3 g/dL (ref 30.0–36.0)
MCV: 82.1 fL (ref 80.0–100.0)
Platelets: 220 10*3/uL (ref 150–400)
RBC: 5.21 MIL/uL (ref 4.22–5.81)
RDW: 16 % — ABNORMAL HIGH (ref 11.5–15.5)
WBC: 5.7 10*3/uL (ref 4.0–10.5)
nRBC: 0 % (ref 0.0–0.2)

## 2022-08-03 LAB — COMPREHENSIVE METABOLIC PANEL
ALT: 81 U/L — ABNORMAL HIGH (ref 0–44)
AST: 58 U/L — ABNORMAL HIGH (ref 15–41)
Albumin: 4.5 g/dL (ref 3.5–5.0)
Alkaline Phosphatase: 39 U/L (ref 38–126)
Anion gap: 10 (ref 5–15)
BUN: 23 mg/dL (ref 8–23)
CO2: 27 mmol/L (ref 22–32)
Calcium: 9.2 mg/dL (ref 8.9–10.3)
Chloride: 94 mmol/L — ABNORMAL LOW (ref 98–111)
Creatinine, Ser: 1 mg/dL (ref 0.61–1.24)
GFR, Estimated: >60 mL/min (ref >60)
Glucose, Bld: 125 mg/dL — ABNORMAL HIGH (ref 70–99)
Potassium: 3.5 mmol/L (ref 3.5–5.1)
Sodium: 131 mmol/L — ABNORMAL LOW (ref 135–145)
Total Bilirubin: 1.5 mg/dL — ABNORMAL HIGH (ref 0.3–1.2)
Total Protein: 8 g/dL (ref 6.5–8.1)

## 2022-08-03 LAB — LIPASE, BLOOD: Lipase: 27 U/L (ref 11–51)

## 2022-08-03 MED ORDER — SODIUM CHLORIDE 0.9 % IV BOLUS
1000.0000 mL | Freq: Once | INTRAVENOUS | Status: AC
  Administered 2022-08-03: 1000 mL via INTRAVENOUS

## 2022-08-03 MED ORDER — ONDANSETRON HCL 4 MG/2ML IJ SOLN
4.0000 mg | Freq: Once | INTRAMUSCULAR | Status: AC
  Administered 2022-08-03: 4 mg via INTRAVENOUS
  Filled 2022-08-03: qty 2

## 2022-08-03 MED ORDER — ONDANSETRON HCL 4 MG PO TABS: 4.0000 mg | ORAL_TABLET | Freq: Three times a day (TID) | ORAL | 0 refills | Status: DC | PRN

## 2022-08-03 NOTE — ED Triage Notes (Signed)
[facility] report: pt with intermittent N/V x 2-3 months. Has had same x 2 days straight. Denies diarrhea.

## 2022-08-03 NOTE — Discharge Instructions (Signed)
Please seek medical attention for any high fevers, chest pain, shortness of breath, change in behavior, persistent vomiting, bloody stool or any other new or concerning symptoms.  

## 2022-08-03 NOTE — ED Triage Notes (Signed)
Pt to ED from [facility] for emesis for a couple months. Generalized abd pain. Reports doctor thinks it could be ozempic.

## 2022-08-03 NOTE — ED Provider Notes (Signed)
[facility] Provider [facility]    Event Date/Time   First MD Initiated Contact with Patient 08/03/22 1021     (approximate)   History   Emesis   HPI  Walter Salas is a 67 y.o. male who presents to the emerged [place] department today because of concerns for nausea vomiting.  Patient has been having symptoms for the past 2 to 3 months.  Has seen his doctor who thinks that is related to Ozempic.  They did cut back his dose of Ozempic.  Over the past 3 days the patient's symptoms have been worse.  Has had multiple episodes of nonbloody vomiting.  This has been accompanied by some abdominal discomfort primarily located in his epigastric region.  Patient denies any fevers or chills.     Physical Exam   Triage Vital Signs: ED Triage Vitals  Encounter Vitals Group     BP 08/03/22 0857 (!) 128/59     Systolic BP Percentile --      Diastolic BP Percentile --      Pulse Rate 08/03/22 0855 70     Resp 08/03/22 0855 18     Temp 08/03/22 0855 98.8 F (37.1 C)     Temp src --      SpO2 08/03/22 0855 99 %     Weight 08/03/22 0855 195 lb (88.5 kg)     Height 08/03/22 0855 5\' 10"  (1.778 m)     Head Circumference --      Peak Flow --      Pain Score 08/03/22 0855 5     Pain Loc --      Pain Education --      Exclude from Growth Chart --     Most recent vital signs: Vitals:   08/03/22 0855 08/03/22 0857  BP:  (!) 128/59  Pulse: 70   Resp: 18   Temp: 98.8 F (37.1 C)   SpO2: 99%    General: Awake, alert, oriented. CV:  Good peripheral perfusion. Regular rate and rhythm. Resp:  Normal effort. Lungs clear. Abd:  No distention. Non tender.   ED Results / Procedures / Treatments   Labs (all labs ordered are listed, but only abnormal results are displayed) Labs Reviewed  COMPREHENSIVE METABOLIC PANEL - Abnormal; Notable for the following components:      Result Value   Sodium 131 (*)    Chloride 94 (*)    Glucose, Bld 125 (*)    AST 58 (*)    ALT 81 (*)     Total Bilirubin 1.5 (*)    All other components within normal limits  CBC - Abnormal; Notable for the following components:   RDW 16.0 (*)    All other components within normal limits  LIPASE, BLOOD  URINALYSIS, ROUTINE W REFLEX MICROSCOPIC     EKG  None   RADIOLOGY None  PROCEDURES:  Critical Care performed: No    MEDICATIONS ORDERED IN ED: Medications - No data to display   IMPRESSION / MDM / ASSESSMENT AND PLAN / ED COURSE  I reviewed the triage vital signs and the nursing notes.                              Differential diagnosis includes, but is not limited to, medication side effect, pancreatitis, gastroparesis  Patient's presentation is most consistent with acute presentation with potential threat to life or bodily function.   Patient  presented to the emergency department today because of concerns for nausea and vomiting.  I send patient is afebrile.  Patient is not hypotensive.  Patient's abdomen is nontender.  Blood work without concerning leukocytosis.  At this time I do think likely symptoms secondary to Ozempic use.  Will give IV fluids and antiemetics and reassess for improvement.  Patient felt significant improvement after IVFs and medication. At this time do think symptoms likely related to ozempic. Will plan on discharging.      FINAL CLINICAL IMPRESSION(S) / ED DIAGNOSES   Final diagnoses:  Nausea and vomiting, unspecified vomiting type    Note:  This document was prepared using Dragon voice recognition software and may include unintentional dictation errors.    Phineas Semen, MD 08/03/22 1240

## 2022-08-11 ENCOUNTER — Ambulatory Visit: Payer: Medicare Other | Attending: Cardiology | Admitting: Cardiology

## 2022-08-11 ENCOUNTER — Encounter: Payer: Self-pay | Admitting: Cardiology

## 2022-08-11 VITALS — BP 98/46 | HR 61 | Ht 70.0 in | Wt 201.0 lb

## 2022-08-11 DIAGNOSIS — I10 Essential (primary) hypertension: Secondary | ICD-10-CM | POA: Diagnosis not present

## 2022-08-11 DIAGNOSIS — G4733 Obstructive sleep apnea (adult) (pediatric): Secondary | ICD-10-CM | POA: Diagnosis not present

## 2022-08-11 NOTE — Addendum Note (Signed)
Addended by: Luellen Pucker on: 08/11/2022 09:46 AM   Modules accepted: Orders

## 2022-08-11 NOTE — Progress Notes (Signed)
Sleep Medicine CONSULT Note    Date:  08/11/2022   ID:  TAVIAN FERRETIZ, DOB 10-21-1955, MRN [id number]  PCP:  Glori Luis, MD  Cardiologist: Yvonne Kendall, MD   Chief Complaint  Patient presents with   New Patient (Initial Visit)    Obstructive sleep apnea    History of Present Illness:  Walter Salas is a 67 y.o. male who is being seen today for the evaluation of possible obstructive sleep apnea at the request of Yvonne Kendall MD and [place].  This is an 67 year old male with a history of HFpEF, CAD, diabetes mellitus, hyperlipidemia, hypertension and atrial fibrillation.  He also carries a diagnosis of sleep apnea.  He is now referred for sleep medicine evaluation. He was dx with OSA at the [place] and tried to use CPAP but is claustraphobic and cannot use the mask.  He has tried multiple different masks including FFM, nasal and nasal cushion masks (over 7 in all) and cannot tolerate them.  It was recommended that he consider the [facility] device.  He says that his last sleep study was about 5-6 years ago.  Currently he is not using any PAP device.  He says that he has lost a lot of weight and does not snore.  He does not wake himself up gasping for breath or snoring.  He wakes up feeling refreshed but does have to nap during the day because he gets sleepy.  Stop Bang score is 4.  Past Medical History:  Diagnosis Date   (HFpEF) heart failure with preserved ejection fraction (HCC)    a. 2018 Echo: EF 60-65%, normal wall motion, Gr1DD, mild aortic valve calcification; b. 12/2017 Echo: EF 60-65%, no rwma, Ao root 4.3cm, Asc Ao 4.4 cm, mildly dil LA.   Agent orange exposure    Anginal pain (HCC)    Arthritis    CAD (coronary artery disease)    a. s/p PCI/DES to the LCx and RCA in 2014; b. 05/2016 MV: EF 55-65%. Small, mild defect in basal anterolateral location->infarct w/o ischemia.   Cancer [facility])    skin   Chronic cough    Chronic pain    Colon polyp    Depression    Diabetes  mellitus without complication (HCC)    Diverticulosis    GERD (gastroesophageal reflux disease)    History of kidney stones    History of stomach ulcers    History of subdural hematoma 12/14/2012   HOH (hard of hearing)    Hx of laminectomy 1999   Hyperlipidemia    Hypertension    Lacunar infarct, acute (HCC)    Myocardial infarction (HCC)    Sleep apnea    Subdural hematoma (HCC) 12/14/2012   Syncope    a. Event monitor 2018: NSR with first-degree AV block, no significant arrhythmias or pauses; b.  Myoview showed a small in size, mild in severity fixed basal and anterior lateral defect, no ischemia, LVEF 55 to 65%, low risk study   Thoracic aortic aneurysm (HCC)    a. TTE 2018: Mildly dil Ao root- 4.2 cm; b. CTA aorta 6/18: Asc Ao aneurysmal dil w max diam of 4.3 cm; b. 12/2019 CTA Chest: stable 4.3cm thor Ao Aneurysm.   Ulcer     Past Surgical History:  Procedure Laterality Date   BACK SURGERY  1993, 1994, 1997, 1998   CARDIOVERSION N/A 06/05/2022   Procedure: CARDIOVERSION;  Surgeon: Jodelle Red, MD;  Location: [facility] INVASIVE CV LAB;  Service: Cardiovascular;  Laterality: N/A;   CARPAL TUNNEL RELEASE Bilateral 1990   CATARACT EXTRACTION W/PHACO Left 03/08/2018   Procedure: CATARACT EXTRACTION PHACO AND INTRAOCULAR LENS PLACEMENT (IOC) LEFT;  Surgeon: Galen Manila, MD;  Location: [facility] ORS;  Service: Ophthalmology;  Laterality: Left;  [place]  00:24.2 CDE 3.25 Fluid pack lot # [id number]   CATARACT EXTRACTION W/PHACO Right 05/24/2018   Procedure: CATARACT EXTRACTION PHACO AND INTRAOCULAR LENS PLACEMENT (IOC) RIGHT;  Surgeon: Galen Manila, MD;  Location: [facility];  Service: Ophthalmology;  Laterality: Right;  Diabetic - oral meds   CERVICAL FUSION  2000. 2003   CHOLECYSTECTOMY  2000   COLONOSCOPY WITH PROPOFOL N/A 02/23/2017   Procedure: COLONOSCOPY WITH PROPOFOL;  Surgeon: Christena Deem, MD;  Location: [facility] ENDOSCOPY;  Service: Endoscopy;  Laterality: N/A;    CORONARY ANGIOPLASTY WITH STENT PLACEMENT  [facility]   [facility]   ESOPHAGOGASTRODUODENOSCOPY (EGD) WITH PROPOFOL N/A 02/23/2017   Procedure: ESOPHAGOGASTRODUODENOSCOPY (EGD) WITH PROPOFOL;  Surgeon: Christena Deem, MD;  Location: [facility] ENDOSCOPY;  Service: Endoscopy;  Laterality: N/A;   PULSE GENERATOR IMPLANT N/A 03/04/2020   Procedure: PULSE GENERATOR PLACEMENT;  Surgeon: Lucy Chris, MD;  Location: [facility] ORS;  Service: Neurosurgery;  Laterality: N/A;  1ST CASE   SHOULDER ARTHROSCOPY Right 2012   THORACIC LAMINECTOMY FOR SPINAL CORD STIMULATOR N/A 02/26/2020   Procedure: THORACIC LAMINECTOMY FOR SPINAL CORD STIMULATOR PADDLE TRIAL;  Surgeon: Lucy Chris, MD;  Location: [facility] ORS;  Service: Neurosurgery;  Laterality: N/A;  1ST CASE   TUMOR EXCISION  2007    Current Medications: No outpatient medications have been marked as taking for the 08/11/22 encounter (Office Visit) with Quintella Reichert, MD.    Allergies:   Gabapentin, Isosorbide mononitrate er [isosorbide dinitrate], Lisinopril, and Tylenol [acetaminophen]   Social History   Socioeconomic History   Marital status: Married    Spouse name: Not on file   Number of children: Not on file   Years of education: Not on file   Highest education level: Associate degree: occupational, Scientist, product/process development, or vocational program  Occupational History   Not on file  Tobacco Use   Smoking status: Every Day    Current packs/day: 0.00    Average packs/day: 1 pack/day for 50.0 years (50.0 ttl pk-yrs)    Types: Cigarettes    Start date: 02/16/1968    Last attempt to quit: 02/15/2018    Years since quitting: 4.4   Smokeless tobacco: Never  Vaping Use   Vaping status: Never Used  Substance and Sexual Activity   Alcohol use: No   Drug use: No   Sexual activity: Never  Other Topics Concern   Not on file  Social History Narrative   Lives in [place] with wife. Has cat.   Work - Librarian, academic, retired       Followed at [employer].      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      Diet - Regular   Exercise - starting back at gym   Social Determinants of Health   Financial Resource Strain: Low Risk  (06/02/2022)   Overall Financial Resource Strain (CARDIA)    Difficulty of Paying Living Expenses: Not hard at all  Food Insecurity: No Food Insecurity (06/02/2022)   Hunger Vital Sign    Worried About Running Out of Food in the Last Year: Never true    Ran Out of Food in the Last Year: Never true  Transportation Needs: No Transportation Needs (06/02/2022)   PRAPARE - Administrator, Civil Service (Medical): No    Lack of Transportation (Non-Medical): No  Physical Activity: Unknown (06/02/2022)   Exercise Vital Sign    Days of Exercise per Week: Patient declined    Minutes of Exercise per Session: Not on file  Stress: No Stress Concern Present (06/02/2022)   Walter Salas of Occupational Health - Occupational Stress Questionnaire    Feeling of Stress : Not at all  Social Connections: Moderately Isolated (06/02/2022)   Social Connection and Isolation Panel [NHANES]    Frequency of Communication with Friends and Family: Twice a week    Frequency of Social Gatherings with Friends and Family: More than three times a week    Attends Religious Services: Never    Database administrator or Organizations: No    Attends Engineer, structural: Not on file    Marital Status: Married     Family History:  The patient's family history includes Diabetes in his brother, father, mother, and sister; Heart disease in his brother, father, paternal grandfather, and sister.   ROS:   Please see the history of present illness.    ROS All other systems reviewed and are negative.     05/06/2016    2:00 PM  PAD Screen  Previous PAD dx? No  Previous surgical procedure? No  Pain with walking? Yes  Subsides with rest? Yes  Feet/toe relief with dangling? No  Painful, non-healing  ulcers? No  Extremities discolored? No       PHYSICAL EXAM:   VS:  There were no vitals taken for this visit.   GEN: Well nourished, well developed, in no acute distress  HEENT: normal  Neck: no JVD, carotid bruits, or masses Cardiac: RRR; no murmurs, rubs, or gallops,no edema.  Intact distal pulses bilaterally.  Respiratory:  clear to auscultation bilaterally, normal work of breathing GI: soft, nontender, nondistended, + BS MS: no deformity or atrophy  Skin: warm and dry, no rash Neuro:  Alert and Oriented x 3, Strength and sensation are intact Psych: euthymic mood, full affect  Wt Readings from Last 3 Encounters:  08/03/22 195 lb (88.5 kg)  07/10/22 205 lb (93 kg)  06/05/22 212 lb (96.2 kg)      Studies/Labs Reviewed:   none  Recent Labs: 04/08/2022: B Natriuretic Peptide 241.0 06/03/2022: TSH 2.24 08/03/2022: ALT 81; BUN 23; Creatinine, Ser 1.00; Hemoglobin 14.7; Platelets 220; Potassium 3.5; Sodium 131    CHA2DS2-VASc Score = 7   This indicates a 11.2% annual risk of stroke. The patient's score is based upon: CHF History: 1 HTN History: 1 Diabetes History: 1 Stroke History: 2 Vascular Disease History: 1 Age Score: 1 Gender Score: 0        Additional studies/ records that were reviewed today include:  none    ASSESSMENT:    1. OSA (obstructive sleep apnea)   2. Essential hypertension      PLAN:  In order of problems listed above:  OSA  -he has a hx of OSA in the past but has been intolerant to CPAP -he has lost over 30lbs and says that he no longer snores but does still have daytime sleepiness with a StopBang score of 4. -he is interested in the [place] device which we discussed at length today on how it works and the benefits  -we need to get an in lab PSG to document that he has an AHI >15/hr  to qualify for the Inspire -if his AHI is high enough then I will refer to ENT for evaluation  2.  HTN -BP controlled on exam today -Continue  prescription drug management with amlodipine 2.5 mg daily, chlorthalidone 25 mg daily, losartan 25 mg daily with as needed refills  Time Spent: 20 minutes total time of encounter, including 15 minutes spent in face-to-face patient care on the date of this encounter. This time includes coordination of care and counseling regarding above mentioned problem list. Remainder of non-face-to-face time involved reviewing chart documents/testing relevant to the patient encounter and documentation in the medical record. I have independently reviewed documentation from referring provider  Medication Adjustments/Labs and Tests Ordered: Current medicines are reviewed at length with the patient today.  Concerns regarding medicines are outlined above.  Medication changes, Labs and Tests ordered today are listed in the Patient Instructions below.  There are no Patient Instructions on file for this visit.   Signed, Armanda Magic, MD  08/11/2022 9:22 AM    [facility] [address] [place], [place], [place]  [address] Phone: [phone]; Fax: [phone]

## 2022-08-11 NOTE — Patient Instructions (Signed)
Medication Instructions:  Your physician recommends that you continue on your current medications as directed. Please refer to the Current Medication list given to you today.  *If you need a refill on your cardiac medications before your next appointment, please call your pharmacy*   Lab Work: None.  If you have labs (blood work) drawn today and your tests are completely normal, you will receive your results only by: MyChart Message (if you have MyChart) OR A paper copy in the mail If you have any lab test that is abnormal or we need to change your treatment, we will call you to review the results.   Testing/Procedures: Your physician has recommended that you have a sleep study. This test records several body functions during sleep, including: brain activity, eye movement, oxygen and carbon dioxide blood levels, heart rate and rhythm, breathing rate and rhythm, the flow of air through your mouth and nose, snoring, body muscle movements, and chest and belly movement. Pending insurance approval, someone will call you to set up an appointment for this test.    Follow-Up: At [facility], you and your health needs are our priority.  As part of our continuing mission to provide you with exceptional heart care, we have created designated Provider Care Teams.  These Care Teams include your primary Cardiologist (physician) and Advanced Practice Providers (APPs -  Physician Assistants and Nurse Practitioners) who all work together to provide you with the care you need, when you need it.  We recommend signing up for the patient portal called "MyChart".  Sign up information is provided on this After Visit Summary.  MyChart is used to connect with patients for Virtual Visits (Telemedicine).  Patients are able to view lab/test results, encounter notes, upcoming appointments, etc.  Non-urgent messages can be sent to your provider as well.   To learn more about what you can do with MyChart, go to  [email or web address].    Your next appointment will be dependent on the results of your sleep study and it will be with:     Provider:   Dr. Armanda Magic, MD

## 2022-08-12 ENCOUNTER — Encounter (INDEPENDENT_AMBULATORY_CARE_PROVIDER_SITE_OTHER): Payer: Self-pay

## 2022-08-13 DIAGNOSIS — G252 Other specified forms of tremor: Secondary | ICD-10-CM | POA: Diagnosis not present

## 2022-08-13 DIAGNOSIS — Z8673 Personal history of transient ischemic attack (TIA), and cerebral infarction without residual deficits: Secondary | ICD-10-CM | POA: Diagnosis not present

## 2022-08-13 DIAGNOSIS — S065XAA Traumatic subdural hemorrhage with loss of consciousness status unknown, initial encounter: Secondary | ICD-10-CM | POA: Diagnosis not present

## 2022-08-14 ENCOUNTER — Other Ambulatory Visit: Payer: Self-pay

## 2022-08-14 DIAGNOSIS — I4819 Other persistent atrial fibrillation: Secondary | ICD-10-CM

## 2022-08-19 ENCOUNTER — Other Ambulatory Visit: Payer: Self-pay | Admitting: Family Medicine

## 2022-08-19 DIAGNOSIS — G8929 Other chronic pain: Secondary | ICD-10-CM

## 2022-08-26 ENCOUNTER — Encounter: Payer: Self-pay | Admitting: Family Medicine

## 2022-08-26 ENCOUNTER — Ambulatory Visit (INDEPENDENT_AMBULATORY_CARE_PROVIDER_SITE_OTHER): Payer: Medicare Other | Admitting: Family Medicine

## 2022-08-26 VITALS — BP 122/80 | HR 73 | Temp 98.2°F | Ht 70.0 in | Wt 192.0 lb

## 2022-08-26 DIAGNOSIS — R1111 Vomiting without nausea: Secondary | ICD-10-CM

## 2022-08-26 DIAGNOSIS — R1032 Left lower quadrant pain: Secondary | ICD-10-CM | POA: Diagnosis not present

## 2022-08-26 MED ORDER — ONDANSETRON HCL 4 MG PO TABS
4.0000 mg | ORAL_TABLET | Freq: Three times a day (TID) | ORAL | 0 refills | Status: DC | PRN
Start: 2022-08-26 — End: 2022-09-09

## 2022-08-26 NOTE — Progress Notes (Signed)
[name], MD Phone: [phone]  Walter Salas is a 67 y.o. male who presents today for same day visit.   Nausea/vomiting: Patient notes this has been going on for a number of months now.  It is worsened over the last month.  He has nausea and vomits up food or brown material.  Nonbloody nonbilious vomitus.  No diarrhea.  No blood in his stool.  He is having bowel movements most days.  He notes some abdominal discomfort in the middle of his abdomen.  No fevers.  No sick contacts.  His urine is yellow though is not dark.  He notes in general he feels fine overall except for the nausea and the vomiting.  He notes he stopped the Ozempic for a month to see if that made a difference and it did not change anything.  Zofran has been helpful for the nausea.  He did go to the emergency department late last month and had reassuring lipase though his liver enzymes were elevated.  Social History   Tobacco Use  Smoking Status Every Day   Current packs/day: 0.00   Average packs/day: 1 pack/day for 50.0 years (50.0 ttl pk-yrs)   Types: Cigarettes   Start date: 02/16/1968   Last attempt to quit: 02/15/2018   Years since quitting: 4.5  Smokeless Tobacco Never    Current Outpatient Medications on File Prior to Visit  Medication Sig Dispense Refill   amiodarone (PACERONE) 200 MG tablet Take 2 tablets by mouth twice a day for 2 weeks then reduce to 1 tablet by mouth twice a day (Patient taking differently: Take 200 mg by mouth 2 (two) times daily.) 290 tablet 3   amLODipine (NORVASC) 2.5 MG tablet TAKE ONE TABLET (2.5 MG) BY MOUTH EVERY DAY 90 tablet 0   apixaban (ELIQUIS) 5 MG TABS tablet Take 5 mg by mouth 2 (two) times daily.     aspirin 81 MG EC tablet Take 81 mg by mouth daily at 12 noon.     atorvastatin (LIPITOR) 80 MG tablet Take 80 mg by mouth daily.     chlorthalidone (HYGROTON) 50 MG tablet Take 25 mg by mouth daily.     clopidogrel (PLAVIX) 75 MG tablet TAKE 1 TABLET BY MOUTH DAILY. 90  tablet 1   DULoxetine (CYMBALTA) 20 MG capsule Take 20 mg by mouth daily.     ezetimibe (ZETIA) 10 MG tablet TAKE 1 TABLET BY MOUTH DAILY. 90 tablet 3   fenofibrate (TRICOR) 145 MG tablet Take 1 tablet (145 mg total) by mouth daily. 30 tablet 0   furosemide (LASIX) 20 MG tablet Take 1 tablet (20 mg total) by mouth daily as needed (for weight gain > 3 lbs over night or > 5 lbs/week.). 30 tablet 4   glucose blood test strip Check once daily, E11.9 100 each 12   losartan (COZAAR) 25 MG tablet Take 25 mg by mouth daily.     metFORMIN (GLUCOPHAGE) 1000 MG tablet Take 1,000 mg by mouth 2 (two) times daily with a meal.     methyl salicylate liquid Apply 1 application topically as needed for muscle pain.     nitroGLYCERIN (NITROSTAT) 0.4 MG SL tablet Place 1 tablet (0.4 mg total) under the tongue every 5 (five) minutes as needed for chest pain. May take up to 3 doses. 30 tablet 0   omeprazole (PRILOSEC) 20 MG capsule Take 20 mg by mouth daily.     polyethylene glycol-electrolytes (NULYTELY) 420 g solution Take by mouth.  polyvinyl alcohol (LIQUIFILM TEARS) 1.4 % ophthalmic solution Place 1 drop into both eyes as needed for dry eyes.      potassium chloride SA (K-DUR,KLOR-CON) 20 MEQ tablet Take 1 tablet (20 mEq total) by mouth daily. 90 tablet 3   pregabalin (LYRICA) 75 MG capsule Take 75 mg by mouth 3 (three) times daily.     tamsulosin (FLOMAX) 0.4 MG CAPS capsule TAKE 1 CAPSULE BY MOUTH ONCE DAILY 30 capsule 3   traMADol (ULTRAM) 50 MG tablet TAKE TWO TABLETS BY MOUTH EVERY 8 HOURS AS NEEDED FOR PAIN 90 tablet 0   VASCEPA 1 g capsule Take 2 g by mouth 2 (two) times daily.     vitamin B-12 (CYANOCOBALAMIN) 1000 MCG tablet Take 1 tablet (1,000 mcg total) by mouth daily. 30 tablet 0   No current facility-administered medications on file prior to visit.     ROS see history of present illness  Objective  Physical Exam Vitals:   08/26/22 1059  BP: 122/80  Pulse: 73  Temp: 98.2 F (36.8 C)   SpO2: 97%    BP Readings from Last 3 Encounters:  08/26/22 122/80  08/11/22 (!) 98/46  08/03/22 124/71   Wt Readings from Last 3 Encounters:  08/26/22 192 lb (87.1 kg)  08/11/22 201 lb (91.2 kg)  08/03/22 195 lb (88.5 kg)    Physical Exam Constitutional:      General: He is not in acute distress.    Appearance: He is not diaphoretic.  Cardiovascular:     Rate and Rhythm: Normal rate and regular rhythm.     Heart sounds: Normal heart sounds.  Pulmonary:     Effort: Pulmonary effort is normal.     Breath sounds: Normal breath sounds.  Abdominal:     General: Bowel sounds are normal. There is no distension.     Palpations: Abdomen is soft.     Tenderness: There is abdominal tenderness (Left lower quadrant).  Skin:    General: Skin is warm and dry.  Neurological:     Mental Status: He is alert.      Assessment/Plan: Please see individual problem list.  Left lower quadrant abdominal pain -     CT ABDOMEN PELVIS W CONTRAST; Future -     Ambulatory referral to Gastroenterology  Vomiting without nausea, unspecified vomiting type Assessment & Plan: Patient with chronic nausea and vomiting issues.  He does have some left lower quadrant pain.  Will obtain a CT scan of his abdomen and pelvis.  We are going to recheck his liver enzymes and kidney function.  I am referring to GI for further evaluation.  He will stop the Ozempic as this could be a side effect from his Ozempic.  I will refill his Zofran.  If he has any significantly worsening symptoms he will seek medical attention in the emergency department.  Orders: -     Comprehensive metabolic panel -     Ambulatory referral to Gastroenterology -     Ondansetron HCl; Take 1 tablet (4 mg total) by mouth every 8 (eight) hours as needed.  Dispense: 20 tablet; Refill: 0    Return for As scheduled.   [name], MD [facility]

## 2022-08-26 NOTE — Patient Instructions (Signed)
Nice to see you. Somebody should contact you to schedule the CT scan of your abdomen and pelvis and GI visit.  If you do not hear on those things in the next week or so please let us know. If you have significantly worsening symptoms please seek medical attention immediately.

## 2022-08-26 NOTE — Assessment & Plan Note (Addendum)
Patient with chronic nausea and vomiting issues.  He does have some left lower quadrant pain.  Will obtain a CT scan of his abdomen and pelvis.  We are going to recheck his liver enzymes and kidney function.  I am referring to GI for further evaluation.  He will stop the Ozempic as this could be a side effect from his Ozempic.  I will refill his Zofran.  If he has any significantly worsening symptoms he will seek medical attention in the emergency department.

## 2022-08-27 ENCOUNTER — Telehealth: Payer: Self-pay | Admitting: Family Medicine

## 2022-08-27 NOTE — Telephone Encounter (Signed)
Lft pt vm to call ofc to sch CT. thanks 

## 2022-08-31 ENCOUNTER — Telehealth: Payer: Self-pay | Admitting: Family Medicine

## 2022-08-31 DIAGNOSIS — R7989 Other specified abnormal findings of blood chemistry: Secondary | ICD-10-CM

## 2022-08-31 NOTE — Telephone Encounter (Signed)
This patient was seen last week.  It looks like he did not end up going to the lab to have labs drawn.  I have placed the orders for future and he should be scheduled to have lab work done anytime this week.

## 2022-09-02 ENCOUNTER — Ambulatory Visit
Admission: RE | Admit: 2022-09-02 | Discharge: 2022-09-02 | Disposition: A | Discharge status: Home / Self Care | Payer: Medicare Other | Source: Ambulatory Visit | Attending: Family Medicine | Admitting: Family Medicine

## 2022-09-02 DIAGNOSIS — R1032 Left lower quadrant pain: Secondary | ICD-10-CM | POA: Diagnosis not present

## 2022-09-02 DIAGNOSIS — N281 Cyst of kidney, acquired: Secondary | ICD-10-CM | POA: Diagnosis not present

## 2022-09-02 MED ORDER — IOHEXOL 300 MG/ML  SOLN
100.0000 mL | Freq: Once | INTRAMUSCULAR | Status: AC | PRN
  Administered 2022-09-02: 100 mL via INTRAVENOUS

## 2022-09-02 NOTE — Telephone Encounter (Signed)
Noted  

## 2022-09-02 NOTE — Telephone Encounter (Signed)
Patient states he will get the lab on 09/09/22 when he comes to see you.

## 2022-09-08 ENCOUNTER — Ambulatory Visit: Payer: Medicare Other | Admitting: Family Medicine

## 2022-09-09 ENCOUNTER — Telehealth: Payer: Self-pay

## 2022-09-09 ENCOUNTER — Telehealth: Payer: Self-pay | Admitting: Family Medicine

## 2022-09-09 ENCOUNTER — Encounter: Payer: Self-pay | Admitting: Family Medicine

## 2022-09-09 ENCOUNTER — Ambulatory Visit (INDEPENDENT_AMBULATORY_CARE_PROVIDER_SITE_OTHER): Payer: Medicare Other | Admitting: Family Medicine

## 2022-09-09 VITALS — BP 120/68 | HR 58 | Temp 98.1°F | Ht 70.0 in | Wt 201.4 lb

## 2022-09-09 DIAGNOSIS — I3139 Other pericardial effusion (noninflammatory): Secondary | ICD-10-CM | POA: Diagnosis not present

## 2022-09-09 DIAGNOSIS — Z7984 Long term (current) use of oral hypoglycemic drugs: Secondary | ICD-10-CM

## 2022-09-09 DIAGNOSIS — E119 Type 2 diabetes mellitus without complications: Secondary | ICD-10-CM | POA: Diagnosis not present

## 2022-09-09 DIAGNOSIS — M545 Low back pain, unspecified: Secondary | ICD-10-CM | POA: Diagnosis not present

## 2022-09-09 DIAGNOSIS — G8929 Other chronic pain: Secondary | ICD-10-CM

## 2022-09-09 DIAGNOSIS — D3500 Benign neoplasm of unspecified adrenal gland: Secondary | ICD-10-CM

## 2022-09-09 DIAGNOSIS — R1111 Vomiting without nausea: Secondary | ICD-10-CM

## 2022-09-09 LAB — COMPREHENSIVE METABOLIC PANEL
ALT: 50 U/L (ref 0–53)
AST: 34 U/L (ref 0–37)
Albumin: 4.1 g/dL (ref 3.5–5.2)
Alkaline Phosphatase: 59 U/L (ref 39–117)
BUN: 18 mg/dL (ref 6–23)
CO2: 31 mEq/L (ref 19–32)
Calcium: 8.7 mg/dL (ref 8.4–10.5)
Chloride: 100 mEq/L (ref 96–112)
Creatinine, Ser: 0.75 mg/dL (ref 0.40–1.50)
GFR: 92.51 mL/min (ref >60.00)
Glucose, Bld: 130 mg/dL — ABNORMAL HIGH (ref 70–99)
Potassium: 3.8 mEq/L (ref 3.5–5.1)
Sodium: 137 mEq/L (ref 135–145)
Total Bilirubin: 0.6 mg/dL (ref 0.2–1.2)
Total Protein: 6.6 g/dL (ref 6.0–8.3)

## 2022-09-09 LAB — HEMOGLOBIN A1C: Hgb A1c MFr Bld: 5.7 % (ref 4.6–6.5)

## 2022-09-09 MED ORDER — ONDANSETRON HCL 4 MG PO TABS
4.0000 mg | ORAL_TABLET | Freq: Three times a day (TID) | ORAL | 0 refills | Status: DC | PRN
Start: 2022-09-09 — End: 2022-10-01

## 2022-09-09 MED ORDER — TRAMADOL HCL 50 MG PO TABS
ORAL_TABLET | ORAL | 0 refills | Status: DC
Start: 2022-09-09 — End: 2022-10-05

## 2022-09-09 NOTE — Telephone Encounter (Signed)
If they had anything sooner that would be great though if they do not have anything sooner that should be fine as well.

## 2022-09-09 NOTE — Assessment & Plan Note (Signed)
Radiology felt as though this was a myelolipoma.  They report this is stable from 2018.

## 2022-09-09 NOTE — Telephone Encounter (Signed)
-----   Message from [place] sent at 09/09/2022  9:57 AM EDT ----- Luan Pulling,  Thanks for the update.  I agree with the radiologist's interpretation that the pericardial effusion looks moderate in size.  It is a little bit larger compared to the last CT of the chest in 12/2021.  I think would be best for [place] to try to get him in for an echo soon to get a better evaluation of the pericardial effusion.  As long as he is not having any symptoms, I do not think he needs to have anything done acutely.  Thayer Ohm ----- Message ----- From: Glori Luis, MD Sent: 09/09/2022   8:42 AM EDT To: Yvonne Kendall, MD; Will Jorja Loa, MD  Hi Drs End and Elberta Fortis,   I saw Walter Salas for follow-up today. I obtained a CT scan of his abdomen for chronic abdominal pain, nausea, and vomiting. It incidentally noted a large pericardial effusion. It looks like he had this on imaging late last year though this is reportedly larger. He notes no chest pain or shortness of breath. I wanted to see if you all recommend anything additional such as an echo. It looks like he is scheduled for an ablation for his afib with Dr Elberta Fortis in September. Thanks for your help.   Walter Salas

## 2022-09-09 NOTE — Telephone Encounter (Signed)
This patient was referred to [facility] couple weeks ago.  He has not heard anything on this.  Can you check on this?

## 2022-09-09 NOTE — Telephone Encounter (Signed)
Left message with a male for the Patient to call the office back when he gets in regarding the message below.

## 2022-09-09 NOTE — Assessment & Plan Note (Signed)
Appears to have enlarged.  This appears to be a chronic issue.  We will communicate with his cardiology team to get their input on any further evaluation for this.

## 2022-09-09 NOTE — Telephone Encounter (Signed)
Please let the patient know that I heard back from one of his cardiologists on the pericardial effusion I discussed with him during his visit.  They are going to order an echo and somebody from their office should be contacting him to get this scheduled.

## 2022-09-09 NOTE — Telephone Encounter (Signed)
ECHO ordered as requested by Dr. Okey Dupre.  Message sent to scheduling to arrange test date this week and follow up after.

## 2022-09-09 NOTE — Assessment & Plan Note (Signed)
Chronic ongoing issue.  Has not improved with stopping Ozempic.  CT imaging was unrevealing.  Patient has not heard from GI on the referral.  Will have somebody reach out to GI.

## 2022-09-09 NOTE — Assessment & Plan Note (Signed)
Chronic issue.  Glucose seems to be well-controlled.  Check A1c today.  Patient will continue metformin 1000 mg twice daily.

## 2022-09-09 NOTE — Progress Notes (Signed)
[name], MD Phone: [phone]  Walter Salas is a 67 y.o. male who presents today for f/u.  Abdominal pain: This continues to be an issue.  He still has nausea and vomiting.  No diarrhea.  Recent CT scan did not reveal a cause for his symptoms.  Diabetes: Patient notes he is come off of Ozempic and has noticed no difference for his nausea and vomiting.  His sugars have been less than 130.  He is on metformin.  No polyuria or polydipsia.  No hypoglycemia.  Pericardial effusion: Noted on CT imaging.  On review of prior imaging reports it appears that this has become larger.  Adrenal mass: Noted to be similar to prior imaging from 2018.  Radiology felt that this was consistent with benign process and likely was a myelolipoma.  Social History   Tobacco Use  Smoking Status Every Day   Current packs/day: 0.00   Average packs/day: 1 pack/day for 50.0 years (50.0 ttl pk-yrs)   Types: Cigarettes   Start date: 02/16/1968   Last attempt to quit: 02/15/2018   Years since quitting: 4.5  Smokeless Tobacco Never    Current Outpatient Medications on File Prior to Visit  Medication Sig Dispense Refill   amiodarone (PACERONE) 200 MG tablet Take 2 tablets by mouth twice a day for 2 weeks then reduce to 1 tablet by mouth twice a day (Patient taking differently: Take 200 mg by mouth 2 (two) times daily.) 290 tablet 3   amLODipine (NORVASC) 2.5 MG tablet TAKE ONE TABLET (2.5 MG) BY MOUTH EVERY DAY 90 tablet 0   apixaban (ELIQUIS) 5 MG TABS tablet Take 5 mg by mouth 2 (two) times daily.     aspirin 81 MG EC tablet Take 81 mg by mouth daily at 12 noon.     atorvastatin (LIPITOR) 80 MG tablet Take 80 mg by mouth daily.     chlorthalidone (HYGROTON) 50 MG tablet Take 25 mg by mouth daily.     clopidogrel (PLAVIX) 75 MG tablet TAKE 1 TABLET BY MOUTH DAILY. 90 tablet 1   DULoxetine (CYMBALTA) 20 MG capsule Take 20 mg by mouth daily.     ezetimibe (ZETIA) 10 MG tablet TAKE 1 TABLET BY MOUTH DAILY. 90  tablet 3   fenofibrate (TRICOR) 145 MG tablet Take 1 tablet (145 mg total) by mouth daily. 30 tablet 0   furosemide (LASIX) 20 MG tablet Take 1 tablet (20 mg total) by mouth daily as needed (for weight gain > 3 lbs over night or > 5 lbs/week.). 30 tablet 4   glucose blood test strip Check once daily, E11.9 100 each 12   losartan (COZAAR) 25 MG tablet Take 25 mg by mouth daily.     metFORMIN (GLUCOPHAGE) 1000 MG tablet Take 1,000 mg by mouth 2 (two) times daily with a meal.     methyl salicylate liquid Apply 1 application topically as needed for muscle pain.     nitroGLYCERIN (NITROSTAT) 0.4 MG SL tablet Place 1 tablet (0.4 mg total) under the tongue every 5 (five) minutes as needed for chest pain. May take up to 3 doses. 30 tablet 0   omeprazole (PRILOSEC) 20 MG capsule Take 20 mg by mouth daily.     polyethylene glycol-electrolytes (NULYTELY) 420 g solution Take by mouth.     polyvinyl alcohol (LIQUIFILM TEARS) 1.4 % ophthalmic solution Place 1 drop into both eyes as needed for dry eyes.      potassium chloride SA (K-DUR,KLOR-CON) 20 MEQ tablet  Take 1 tablet (20 mEq total) by mouth daily. 90 tablet 3   pregabalin (LYRICA) 75 MG capsule Take 75 mg by mouth 3 (three) times daily.     tamsulosin (FLOMAX) 0.4 MG CAPS capsule TAKE 1 CAPSULE BY MOUTH ONCE DAILY 30 capsule 3   VASCEPA 1 g capsule Take 2 g by mouth 2 (two) times daily.     vitamin B-12 (CYANOCOBALAMIN) 1000 MCG tablet Take 1 tablet (1,000 mcg total) by mouth daily. 30 tablet 0   No current facility-administered medications on file prior to visit.     ROS see history of present illness  Objective  Physical Exam Vitals:   09/09/22 0825  BP: 120/68  Pulse: (!) 58  Temp: 98.1 F (36.7 C)  SpO2: 98%    BP Readings from Last 3 Encounters:  09/09/22 120/68  08/26/22 122/80  08/11/22 (!) 98/46   Wt Readings from Last 3 Encounters:  09/09/22 201 lb 6.4 oz (91.4 kg)  08/26/22 192 lb (87.1 kg)  08/11/22 201 lb (91.2 kg)     Physical Exam Constitutional:      General: He is not in acute distress.    Appearance: He is not diaphoretic.  Cardiovascular:     Rate and Rhythm: Normal rate and regular rhythm.     Heart sounds: Normal heart sounds.  Pulmonary:     Effort: Pulmonary effort is normal.     Breath sounds: Normal breath sounds.  Abdominal:     General: Bowel sounds are normal. There is no distension.     Palpations: Abdomen is soft.     Tenderness: There is no abdominal tenderness.  Skin:    General: Skin is warm and dry.  Neurological:     Mental Status: He is alert.      Assessment/Plan: Please see individual problem list.  Type 2 diabetes mellitus without complication, without long-term current use of insulin (HCC) Assessment & Plan: Chronic issue.  Glucose seems to be well-controlled.  Check A1c today.  Patient will continue metformin 1000 mg twice daily.  Orders: -     Comprehensive metabolic panel -     Hemoglobin A1c  Vomiting without nausea, unspecified vomiting type Assessment & Plan: Chronic ongoing issue.  Has not improved with stopping Ozempic.  CT imaging was unrevealing.  Patient has not heard from GI on the referral.  Will have somebody reach out to GI.  Orders: -     Ondansetron HCl; Take 1 tablet (4 mg total) by mouth every 8 (eight) hours as needed.  Dispense: 20 tablet; Refill: 0 -     Comprehensive metabolic panel  Pericardial effusion Assessment & Plan: Appears to have enlarged.  This appears to be a chronic issue.  We will communicate with his cardiology team to get their input on any further evaluation for this.   Benign neoplasm of adrenal gland, unspecified laterality Assessment & Plan: Radiology felt as though this was a myelolipoma.  They report this is stable from 2018.   Chronic low back pain, unspecified back pain laterality, unspecified whether sciatica present -     traMADol HCl; TAKE TWO TABLETS BY MOUTH EVERY 8 HOURS AS NEEDED FOR PAIN   Dispense: 90 tablet; Refill: 0    Return in about 3 months (around 12/10/2022).   [name], MD [facility]

## 2022-09-10 NOTE — Telephone Encounter (Signed)
Left message to call the office back.

## 2022-09-11 ENCOUNTER — Ambulatory Visit: Payer: Medicare Other | Attending: Internal Medicine

## 2022-09-11 DIAGNOSIS — I3139 Other pericardial effusion (noninflammatory): Secondary | ICD-10-CM

## 2022-09-12 LAB — ECHOCARDIOGRAM COMPLETE
AR max vel: 2.36 cm2
AV Area VTI: 2.39 cm2
AV Area mean vel: 2.25 cm2
AV Mean grad: 5 mmHg
AV Peak grad: 9.5 mmHg
Ao pk vel: 1.54 m/s
Area-P 1/2: 3.48 cm2
Calc EF: 62.4 %
S' Lateral: 3.25 cm
Single Plane A2C EF: 57.4 %
Single Plane A4C EF: 65.7 %

## 2022-09-15 NOTE — Telephone Encounter (Signed)
Sent a my chart message.

## 2022-09-15 NOTE — Telephone Encounter (Signed)
Left message with someone to call the office back

## 2022-09-23 NOTE — Addendum Note (Signed)
Addended by: Parke Poisson on: 09/23/2022 10:01 AM   Modules accepted: Orders

## 2022-09-28 ENCOUNTER — Telehealth (HOSPITAL_COMMUNITY): Payer: Self-pay | Admitting: *Deleted

## 2022-09-28 NOTE — Telephone Encounter (Signed)
Attempted to call patient regarding upcoming cardiac CT appointment. Left message with wife with name and call back number.  Larey Brick RN Navigator Cardiac Imaging [facility] [phone] Office [phone] Cell

## 2022-09-29 ENCOUNTER — Ambulatory Visit
Admission: RE | Admit: 2022-09-29 | Discharge: 2022-09-29 | Disposition: A | Discharge status: Home / Self Care | Payer: Medicare Other | Source: Ambulatory Visit | Attending: Cardiology | Admitting: Cardiology

## 2022-09-29 DIAGNOSIS — I4819 Other persistent atrial fibrillation: Secondary | ICD-10-CM | POA: Insufficient documentation

## 2022-09-29 MED ORDER — SODIUM CHLORIDE 0.9 % IV BOLUS
150.0000 mL | Freq: Once | INTRAVENOUS | Status: AC
  Administered 2022-09-29: 150 mL via INTRAVENOUS

## 2022-09-29 MED ORDER — IOHEXOL 350 MG/ML SOLN
75.0000 mL | Freq: Once | INTRAVENOUS | Status: AC | PRN
  Administered 2022-09-29: 75 mL via INTRAVENOUS

## 2022-09-30 ENCOUNTER — Ambulatory Visit: Payer: Medicare Other | Admitting: Internal Medicine

## 2022-09-30 ENCOUNTER — Telehealth: Payer: Self-pay | Admitting: Family Medicine

## 2022-09-30 NOTE — Telephone Encounter (Signed)
Patient just called and states no one has called him to schedule his GI appointment. He said Dr. Birdie Sons told him to contact us if they haven't called him in a week. He wants to know what he should do. His number is [phone]. He also needs a refill on ondansetron (ZOFRAN) 4 MG tablet. The pharmacy he uses [facility] - [place], [place] - [address] [place], [place] [address] Phone: [phone]  Fax: [phone]

## 2022-10-01 ENCOUNTER — Other Ambulatory Visit: Payer: Self-pay

## 2022-10-01 DIAGNOSIS — R1111 Vomiting without nausea: Secondary | ICD-10-CM

## 2022-10-01 MED ORDER — ONDANSETRON HCL 4 MG PO TABS
4.0000 mg | ORAL_TABLET | Freq: Three times a day (TID) | ORAL | 0 refills | Status: DC | PRN
Start: 2022-10-01 — End: 2022-10-23

## 2022-10-01 NOTE — Telephone Encounter (Signed)
Can you call [facility] and see when his appointment is scheduled for and see what diagnoses they scheduled it for? It looks like his colonoscopy is scheduled for 01/20/22, though I can not tell if he has an office visit scheduled with [facility] for this issue.

## 2022-10-01 NOTE — Telephone Encounter (Signed)
01/14/23 at 11:30 for the left lower quadrant & vomiting due to not having anything else available at this time. Megan at [facility]  states he is on the wait list.

## 2022-10-01 NOTE — Telephone Encounter (Signed)
Patient is scheduled for 01/14/23 to see GI but the Patient states that is for a colonoscopy but [place] and the actual referral say it is for the left lower quadrant pain & vomiting. Patient states he can not wait that long to be seen so he wants to know if there is anything you can do to help speed this process along.

## 2022-10-02 NOTE — Telephone Encounter (Signed)
Noted.  Unfortunately they did not have any sooner openings.  The patient should try to call them periodically to see if he can get a sooner appointment off of the wait list.

## 2022-10-02 NOTE — Telephone Encounter (Signed)
Called and spoke with pt.  He will call office to be added to their wait list.

## 2022-10-05 ENCOUNTER — Other Ambulatory Visit: Payer: Self-pay | Admitting: Family Medicine

## 2022-10-05 DIAGNOSIS — M545 Low back pain, unspecified: Secondary | ICD-10-CM

## 2022-10-05 NOTE — Pre-Procedure Instructions (Signed)
Attempted to call patient regarding procedure instructions.  Left voicemail on the following items: Arrival time 1100 Nothing to eat or drink after midnight No meds AM of procedure Responsible person to drive you home and stay with you for 24 hrs  Have you missed any doses of anti-coagulant Eliquis- should be taken twice a day, if you have missed any doses please let us know.  Don't take dose in the morning.

## 2022-10-06 ENCOUNTER — Ambulatory Visit (HOSPITAL_COMMUNITY)
Admission: RE | Admit: 2022-10-06 | Discharge: 2022-10-06 | Disposition: A | Discharge status: Home / Self Care | Payer: Medicare Other | Attending: Cardiology | Admitting: Cardiology

## 2022-10-06 ENCOUNTER — Ambulatory Visit (HOSPITAL_BASED_OUTPATIENT_CLINIC_OR_DEPARTMENT_OTHER): Payer: Medicare Other | Admitting: Anesthesiology

## 2022-10-06 ENCOUNTER — Ambulatory Visit (HOSPITAL_COMMUNITY): Payer: Medicare Other | Admitting: Anesthesiology

## 2022-10-06 ENCOUNTER — Ambulatory Visit (HOSPITAL_COMMUNITY)
Admission: RE | Disposition: A | Discharge status: Home / Self Care | Payer: Self-pay | Source: Home / Self Care | Attending: Cardiology

## 2022-10-06 DIAGNOSIS — F17210 Nicotine dependence, cigarettes, uncomplicated: Secondary | ICD-10-CM | POA: Diagnosis not present

## 2022-10-06 DIAGNOSIS — I2510 Atherosclerotic heart disease of native coronary artery without angina pectoris: Secondary | ICD-10-CM | POA: Diagnosis not present

## 2022-10-06 DIAGNOSIS — K219 Gastro-esophageal reflux disease without esophagitis: Secondary | ICD-10-CM | POA: Diagnosis not present

## 2022-10-06 DIAGNOSIS — I4891 Unspecified atrial fibrillation: Secondary | ICD-10-CM | POA: Diagnosis not present

## 2022-10-06 DIAGNOSIS — I4819 Other persistent atrial fibrillation: Secondary | ICD-10-CM | POA: Diagnosis not present

## 2022-10-06 DIAGNOSIS — I110 Hypertensive heart disease with heart failure: Secondary | ICD-10-CM | POA: Diagnosis not present

## 2022-10-06 DIAGNOSIS — E119 Type 2 diabetes mellitus without complications: Secondary | ICD-10-CM | POA: Insufficient documentation

## 2022-10-06 DIAGNOSIS — I5032 Chronic diastolic (congestive) heart failure: Secondary | ICD-10-CM | POA: Insufficient documentation

## 2022-10-06 DIAGNOSIS — I10 Essential (primary) hypertension: Secondary | ICD-10-CM | POA: Diagnosis not present

## 2022-10-06 DIAGNOSIS — G4733 Obstructive sleep apnea (adult) (pediatric): Secondary | ICD-10-CM | POA: Insufficient documentation

## 2022-10-06 DIAGNOSIS — I252 Old myocardial infarction: Secondary | ICD-10-CM | POA: Insufficient documentation

## 2022-10-06 DIAGNOSIS — Z8673 Personal history of transient ischemic attack (TIA), and cerebral infarction without residual deficits: Secondary | ICD-10-CM | POA: Insufficient documentation

## 2022-10-06 HISTORY — PX: ATRIAL FIBRILLATION ABLATION: EP1191

## 2022-10-06 LAB — CBC
HCT: 39.5 % (ref 39.0–52.0)
Hemoglobin: 12.4 g/dL — ABNORMAL LOW (ref 13.0–17.0)
MCH: 27.4 pg (ref 26.0–34.0)
MCHC: 31.4 g/dL (ref 30.0–36.0)
MCV: 87.4 fL (ref 80.0–100.0)
Platelets: 183 10*3/uL (ref 150–400)
RBC: 4.52 MIL/uL (ref 4.22–5.81)
RDW: 15.1 % (ref 11.5–15.5)
WBC: 5 10*3/uL (ref 4.0–10.5)
nRBC: 0 % (ref 0.0–0.2)

## 2022-10-06 LAB — GLUCOSE, CAPILLARY
Glucose-Capillary: 133 mg/dL — ABNORMAL HIGH (ref 70–99)
Glucose-Capillary: 120 mg/dL — ABNORMAL HIGH (ref 70–99)

## 2022-10-06 LAB — POCT ACTIVATED CLOTTING TIME: Activated Clotting Time: 244 seconds

## 2022-10-06 SURGERY — ATRIAL FIBRILLATION ABLATION
Anesthesia: General

## 2022-10-06 MED ORDER — PROTAMINE SULFATE 10 MG/ML IV SOLN
INTRAVENOUS | Status: DC | PRN
  Administered 2022-10-06: 40 mg via INTRAVENOUS

## 2022-10-06 MED ORDER — SODIUM CHLORIDE 0.9% FLUSH: 3.0000 mL | Freq: Two times a day (BID) | INTRAVENOUS | Status: DC

## 2022-10-06 MED ORDER — ONDANSETRON HCL 4 MG/2ML IJ SOLN: 4.0000 mg | Freq: Four times a day (QID) | INTRAMUSCULAR | Status: DC | PRN

## 2022-10-06 MED ORDER — PROPOFOL 10 MG/ML IV BOLUS
INTRAVENOUS | Status: DC | PRN
  Administered 2022-10-06: 150 mg via INTRAVENOUS

## 2022-10-06 MED ORDER — ROCURONIUM BROMIDE 10 MG/ML (PF) SYRINGE
PREFILLED_SYRINGE | INTRAVENOUS | Status: DC | PRN
  Administered 2022-10-06: 60 mg via INTRAVENOUS

## 2022-10-06 MED ORDER — HEPARIN SODIUM (PORCINE) 1000 UNIT/ML IJ SOLN
INTRAMUSCULAR | Status: AC
  Filled 2022-10-06: qty 20

## 2022-10-06 MED ORDER — SODIUM CHLORIDE 0.9% FLUSH: 3.0000 mL | INTRAVENOUS | Status: DC | PRN

## 2022-10-06 MED ORDER — SODIUM CHLORIDE 0.9 % IV SOLN: 250.0000 mL | INTRAVENOUS | Status: DC | PRN

## 2022-10-06 MED ORDER — ONDANSETRON HCL 4 MG/2ML IJ SOLN
INTRAMUSCULAR | Status: DC | PRN
  Administered 2022-10-06: 4 mg via INTRAVENOUS

## 2022-10-06 MED ORDER — HEPARIN SODIUM (PORCINE) 1000 UNIT/ML IJ SOLN
INTRAMUSCULAR | Status: AC
  Filled 2022-10-06: qty 10

## 2022-10-06 MED ORDER — FENTANYL CITRATE (PF) 250 MCG/5ML IJ SOLN
INTRAMUSCULAR | Status: DC | PRN
  Administered 2022-10-06: 100 ug via INTRAVENOUS

## 2022-10-06 MED ORDER — PHENYLEPHRINE HCL-NACL 20-0.9 MG/250ML-% IV SOLN
INTRAVENOUS | Status: DC | PRN
  Administered 2022-10-06: 50 ug/min via INTRAVENOUS

## 2022-10-06 MED ORDER — FENTANYL CITRATE (PF) 100 MCG/2ML IJ SOLN
INTRAMUSCULAR | Status: AC
  Filled 2022-10-06: qty 2

## 2022-10-06 MED ORDER — HEPARIN (PORCINE) IN NACL 1000-0.9 UT/500ML-% IV SOLN
INTRAVENOUS | Status: DC | PRN
  Administered 2022-10-06 (×4): 500 mL

## 2022-10-06 MED ORDER — ATROPINE SULFATE 0.4 MG/ML IV SOLN
INTRAVENOUS | Status: DC | PRN
Start: 2022-10-06 — End: 2022-10-06
  Administered 2022-10-06: 1 mg via INTRAVENOUS

## 2022-10-06 MED ORDER — LIDOCAINE 2% (20 MG/ML) 5 ML SYRINGE
INTRAMUSCULAR | Status: DC | PRN
  Administered 2022-10-06: 100 mg via INTRAVENOUS

## 2022-10-06 MED ORDER — SUGAMMADEX SODIUM 200 MG/2ML IV SOLN
INTRAVENOUS | Status: DC | PRN
  Administered 2022-10-06: 200 mg via INTRAVENOUS

## 2022-10-06 MED ORDER — HEPARIN SODIUM (PORCINE) 1000 UNIT/ML IJ SOLN
INTRAMUSCULAR | Status: DC | PRN
Start: 2022-10-06 — End: 2022-10-06
  Administered 2022-10-06: 15000 [IU] via INTRAVENOUS
  Administered 2022-10-06: 8000 [IU] via INTRAVENOUS

## 2022-10-06 MED ORDER — SODIUM CHLORIDE 0.9 % IV SOLN: INTRAVENOUS | Status: DC

## 2022-10-06 SURGICAL SUPPLY — 19 items
CABLE PFA RX CATH CONN (CABLE) ×1
CATH 8FR REPROCESSED SOUNDSTAR (CATHETERS) ×1 IMPLANT
CATH FARAWAVE ABLATION 31 (CATHETERS) ×1
CATH OCTARAY 2.0 F 3-3-3-3-3 (CATHETERS) ×1
CATH WEB BI DIR CSDF CRV REPRO (CATHETERS) ×1
CLOSURE MYNX CONTROL 6F/7F (Vascular Products) ×2 IMPLANT
CLOSURE PERCLOSE PROSTYLE (VASCULAR PRODUCTS) ×2
COVER SWIFTLINK CONNECTOR (BAG) ×1
DILATOR VESSEL 38 20CM 16FR (INTRODUCER) ×1
INQWIRE 1.5J .035X260CM (WIRE) ×1
PACK EP LATEX FREE (CUSTOM PROCEDURE TRAY) ×1
PACK EP LF (CUSTOM PROCEDURE TRAY) ×1
PAD DEFIB RADIO PHYSIO CONN (PAD) ×1
PATCH CARTO3 (PAD) ×1
SHEATH FARADRIVE STEERABLE (SHEATH) ×1
SHEATH PINNACLE 7F 10CM (SHEATH) ×1
SHEATH PINNACLE 8F 10CM (SHEATH) ×1
SHEATH PINNACLE 9F 10CM (SHEATH) ×1
SHEATH WIRE KIT BAYLIS SL1 (KITS) ×1

## 2022-10-06 NOTE — Anesthesia Procedure Notes (Signed)
Procedure Name: Intubation Date/Time: 10/06/2022 1:28 PM  Performed by: Alwyn Ren, CRNAPre-anesthesia Checklist: Patient identified, Emergency Drugs available, Suction available and Patient being monitored Patient Re-evaluated:Patient Re-evaluated prior to induction Oxygen Delivery Method: Circle system utilized Preoxygenation: Pre-oxygenation with 100% oxygen Induction Type: IV induction Ventilation: Mask ventilation without difficulty Laryngoscope Size: Miller and 3 Tube type: Oral Tube size: 7.0 mm Number of attempts: 2 Airway Equipment and Method: Stylet and Oral airway Placement Confirmation: ETT inserted through vocal cords under direct vision, positive ETCO2 and breath sounds checked- equal and bilateral Secured at: 23 cm Tube secured with: Tape Dental Injury: Teeth and Oropharynx as per pre-operative assessment

## 2022-10-06 NOTE — Progress Notes (Signed)
Patient walked to the bathroom without difficulties. Groin sites level 0, clean, dry and intact.

## 2022-10-06 NOTE — Anesthesia Preprocedure Evaluation (Signed)
Anesthesia Evaluation  Patient identified by MRN, date of birth, ID band Patient awake    Reviewed: Allergy & Precautions, H&P , NPO status , Patient's Chart, lab work & pertinent test results  Airway Mallampati: II  TM Distance: >3 FB Neck ROM: Full    Dental no notable dental hx.    Pulmonary sleep apnea , Current Smoker and Patient abstained from smoking.   Pulmonary exam normal breath sounds clear to auscultation       Cardiovascular hypertension, + CAD and + Past MI  Normal cardiovascular exam+ dysrhythmias Atrial Fibrillation  Rhythm:Regular Rate:Normal  Thoracic aortic aneurysm   Neuro/Psych  PSYCHIATRIC DISORDERS  Depression    CVA    GI/Hepatic Neg liver ROS,GERD  ,,  Endo/Other  negative endocrine ROSdiabetes    Renal/GU negative Renal ROS  negative genitourinary   Musculoskeletal  (+) Arthritis ,    Abdominal   Peds negative pediatric ROS (+)  Hematology negative hematology ROS (+)   Anesthesia Other Findings   Reproductive/Obstetrics negative OB ROS                             Anesthesia Physical Anesthesia Plan  ASA: 3  Anesthesia Plan: General   Post-op Pain Management:    Induction: Intravenous  PONV Risk Score and Plan: Ondansetron and Dexamethasone  Airway Management Planned: Oral ETT  Additional Equipment:   Intra-op Plan:   Post-operative Plan: Extubation in OR  Informed Consent: I have reviewed the patients History and Physical, chart, labs and discussed the procedure including the risks, benefits and alternatives for the proposed anesthesia with the patient or authorized representative who has indicated his/her understanding and acceptance.     Dental advisory given  Plan Discussed with: CRNA  Anesthesia Plan Comments:        Anesthesia Quick Evaluation

## 2022-10-06 NOTE — Transfer of Care (Signed)
Immediate Anesthesia Transfer of Care Note  Patient: Walter Salas  Procedure(s) Performed: ATRIAL FIBRILLATION ABLATION  Patient Location: PACU  Anesthesia Type:General  Level of Consciousness: awake, alert , and oriented  Airway & Oxygen Therapy: Patient Spontanous Breathing and Patient connected to face mask oxygen  Post-op Assessment: Report given to RN and Post -op Vital signs reviewed and stable  Post vital signs: Reviewed and stable  Last Vitals:  Vitals Value Taken Time  BP    Temp    Pulse    Resp    SpO2      Last Pain:  Vitals:   10/06/22 1122  TempSrc:   PainSc: 0-No pain         Complications: There were no known notable events for this encounter.

## 2022-10-06 NOTE — Discharge Instructions (Signed)

## 2022-10-06 NOTE — H&P (Signed)
Electrophysiology Office Note   Date:  10/06/2022   ID:  Walter Salas, Walter Salas January 06, 1956, MRN [id number]  PCP:  Glori Luis, MD  Cardiologist:  End Primary Electrophysiologist:  Mingo Siegert Jorja Loa, MD    Chief Complaint: AF3   History of Present Illness: Walter Salas is a 67 y.o. male who is being seen today for the evaluation of AF at the request of No ref. provider found. Presenting today for electrophysiology evaluation.  He has a history significant hypertension, coronary artery disease post PCI to the circumflex and RCA in 2014, TIA, OSA intolerant to CPAP, GERD, diabetes, hypertension, CVA, diastolic heart failure.  He has had CVAs in the past.  Most recently February 2023 with lacunar and thalamus infarcts.  He presented to [facility] 04/08/2022 with worsening shortness of breath and orthopnea.  He had run out of his chlorthalidone.  He was found to be in atrial fibrillation.  He has been started on amiodarone.  He presented for cardioversion, but had converted to sinus rhythm.  He feels weak, fatigued, short of breath.  He is feeling that way today and has for the last month and a half.  He would prefer a rhythm control strategy.  Today, denies symptoms of palpitations, chest pain, shortness of breath, orthopnea, PND, lower extremity edema, claudication, dizziness, presyncope, syncope, bleeding, or neurologic sequela. The patient is tolerating medications without difficulties. Plan ablation today.   Past Medical History:  Diagnosis Date   (HFpEF) heart failure with preserved ejection fraction (HCC)    a. 2018 Echo: EF 60-65%, normal wall motion, Gr1DD, mild aortic valve calcification; b. 12/2017 Echo: EF 60-65%, no rwma, Ao root 4.3cm, Asc Ao 4.4 cm, mildly dil LA.   Agent orange exposure    Anginal pain (HCC)    Arthritis    CAD (coronary artery disease)    a. s/p PCI/DES to the LCx and RCA in 2014; b. 05/2016 MV: EF 55-65%. Small, mild defect in basal anterolateral  location->infarct w/o ischemia.   Cancer [facility])    skin   Chronic cough    Chronic pain    Colon polyp    Depression    Diabetes mellitus without complication (HCC)    Diverticulosis    GERD (gastroesophageal reflux disease)    History of kidney stones    History of stomach ulcers    History of subdural hematoma 12/14/2012   HOH (hard of hearing)    Hx of laminectomy 1999   Hyperlipidemia    Hypertension    Lacunar infarct, acute (HCC)    Myocardial infarction (HCC)    Sleep apnea    Subdural hematoma (HCC) 12/14/2012   Syncope    a. Event monitor 2018: NSR with first-degree AV block, no significant arrhythmias or pauses; b.  Myoview showed a small in size, mild in severity fixed basal and anterior lateral defect, no ischemia, LVEF 55 to 65%, low risk study   Thoracic aortic aneurysm (HCC)    a. TTE 2018: Mildly dil Ao root- 4.2 cm; b. CTA aorta 6/18: Asc Ao aneurysmal dil w max diam of 4.3 cm; b. 12/2019 CTA Chest: stable 4.3cm thor Ao Aneurysm.   Ulcer    Past Surgical History:  Procedure Laterality Date   BACK SURGERY  1993, 1994, 1997, 1998   CARDIOVERSION N/A 06/05/2022   Procedure: CARDIOVERSION;  Surgeon: Jodelle Red, MD;  Location: [facility] INVASIVE CV LAB;  Service: Cardiovascular;  Laterality: N/A;   CARPAL TUNNEL RELEASE Bilateral 1990  CATARACT EXTRACTION W/PHACO Left 03/08/2018   Procedure: CATARACT EXTRACTION PHACO AND INTRAOCULAR LENS PLACEMENT (IOC) LEFT;  Surgeon: Galen Manila, MD;  Location: [facility] ORS;  Service: Ophthalmology;  Laterality: Left;  [place]  00:24.2 CDE 3.25 Fluid pack lot # [id number]   CATARACT EXTRACTION W/PHACO Right 05/24/2018   Procedure: CATARACT EXTRACTION PHACO AND INTRAOCULAR LENS PLACEMENT (IOC) RIGHT;  Surgeon: Galen Manila, MD;  Location: [facility];  Service: Ophthalmology;  Laterality: Right;  Diabetic - oral meds   CERVICAL FUSION  2000. 2003   CHOLECYSTECTOMY  2000   COLONOSCOPY WITH PROPOFOL N/A 02/23/2017    Procedure: COLONOSCOPY WITH PROPOFOL;  Surgeon: Christena Deem, MD;  Location: [facility] ENDOSCOPY;  Service: Endoscopy;  Laterality: N/A;   CORONARY ANGIOPLASTY WITH STENT PLACEMENT  2014   [facility]   ESOPHAGOGASTRODUODENOSCOPY (EGD) WITH PROPOFOL N/A 02/23/2017   Procedure: ESOPHAGOGASTRODUODENOSCOPY (EGD) WITH PROPOFOL;  Surgeon: Christena Deem, MD;  Location: [facility] ENDOSCOPY;  Service: Endoscopy;  Laterality: N/A;   PULSE GENERATOR IMPLANT N/A 03/04/2020   Procedure: PULSE GENERATOR PLACEMENT;  Surgeon: Lucy Chris, MD;  Location: [facility] ORS;  Service: Neurosurgery;  Laterality: N/A;  1ST CASE   SHOULDER ARTHROSCOPY Right 2012   THORACIC LAMINECTOMY FOR SPINAL CORD STIMULATOR N/A 02/26/2020   Procedure: THORACIC LAMINECTOMY FOR SPINAL CORD STIMULATOR PADDLE TRIAL;  Surgeon: Lucy Chris, MD;  Location: [facility] ORS;  Service: Neurosurgery;  Laterality: N/A;  1ST CASE   TUMOR EXCISION  2007     Current Facility-Administered Medications  Medication Dose Route Frequency Provider Last Rate Last Admin   0.9 %  sodium chloride infusion   Intravenous Continuous Regan Lemming, MD 50 mL/hr at 10/06/22 1139 New Bag at 10/06/22 1139    Allergies:   Gabapentin, Isosorbide mononitrate er [isosorbide dinitrate], Lisinopril, and Tylenol [acetaminophen]   Social History:  The patient  reports that he has been smoking cigarettes. He started smoking about 54 years ago. He has a 50 pack-year smoking history. He has never used smokeless tobacco. He reports that he does not drink alcohol and does not use drugs.   Family History:  The patient's family history includes Diabetes in his brother, father, mother, and sister; Heart disease in his brother, father, paternal grandfather, and sister.   ROS:  Please see the history of present illness.   Otherwise, review of systems is positive for none.   All other systems are reviewed and negative.   PHYSICAL EXAM: VS:  BP (!)  140/72   Pulse (!) 54   Temp 97.9 F (36.6 C) (Temporal)   Resp 18   Ht 5\' 10"  (1.778 m)   Wt 93 kg   SpO2 97%   BMI 29.41 kg/m  , BMI Body mass index is 29.41 kg/m. GEN: Well nourished, well developed, in no acute distress  HEENT: normal  Neck: no JVD, carotid bruits, or masses Cardiac: RRR; no murmurs, rubs, or gallops,no edema  Respiratory:  clear to auscultation bilaterally, normal work of breathing GI: soft, nontender, nondistended, + BS MS: no deformity or atrophy  Skin: warm and dry Neuro:  Strength and sensation are intact Psych: euthymic mood, full affect  Recent Labs: 04/08/2022: B Natriuretic Peptide 241.0 06/03/2022: TSH 2.24 09/09/2022: ALT 50; BUN 18; Creatinine, Ser 0.75; Potassium 3.8; Sodium 137 10/06/2022: Hemoglobin 12.4; Platelets 183    Lipid Panel     Component Value Date/Time   CHOL 99 08/06/2021 0850   TRIG 113.0 08/06/2021 0850  HDL 35.50 (L) 08/06/2021 0850   CHOLHDL 3 08/06/2021 0850   VLDL 22.6 08/06/2021 0850   LDLCALC 41 08/06/2021 0850   LDLDIRECT 90.0 06/30/2021 0955     Wt Readings from Last 3 Encounters:  10/06/22 93 kg  09/09/22 91.4 kg  08/26/22 87.1 kg      Other studies Reviewed: Additional studies/ records that were reviewed today include: TTE 02/17/22  Review of the above records today demonstrates:   1. Left ventricular ejection fraction, by estimation, is 55 to 60%. The  left ventricle has normal function. The left ventricle has no regional  wall motion abnormalities. There is mild asymmetric left ventricular  hypertrophy of the basal-septal segment.  Left ventricular diastolic parameters are consistent with Grade II  diastolic dysfunction (pseudonormalization).   2. Right ventricular systolic function is normal. The right ventricular  size is normal. Tricuspid regurgitation signal is inadequate for assessing  PA pressure.   3. Left atrial size was mildly dilated.   4. The mitral valve is normal in structure. No  evidence of mitral valve  regurgitation. No evidence of mitral stenosis.   5. The aortic valve is normal in structure. Aortic valve regurgitation is  not visualized. No aortic stenosis is present.   6. There is borderline dilatation of the aortic root, measuring 39 mm.  There is mild dilatation of the ascending aorta, measuring 40 mm.   7. The inferior vena cava is normal in size with greater than 50%  respiratory variability, suggesting right atrial pressure of 3 mmHg    ASSESSMENT AND PLAN:  1.  Persistent atrial fibrillation: Jessica A Hayslett has presented today for surgery, with the diagnosis of AF.  The various methods of treatment have been discussed with the patient and family. After consideration of risks, benefits and other options for treatment, the patient has consented to  Procedure(s): Catheter ablation as a surgical intervention .  Risks include but not limited to complete heart block, stroke, esophageal damage, nerve damage, bleeding, vascular damage, tamponade, perforation, MI, and death. The patient's history has been reviewed, patient examined, no change in status, stable for surgery.  I have reviewed the patient's chart and labs.  Questions were answered to the patient's satisfaction.    Glenice Ciccone Elberta Fortis, MD 10/06/2022 12:30 PM

## 2022-10-06 NOTE — Anesthesia Postprocedure Evaluation (Signed)
Anesthesia Post Note  Patient: Walter Salas  Procedure(s) Performed: ATRIAL FIBRILLATION ABLATION     Patient location during evaluation: PACU Anesthesia Type: General Level of consciousness: awake and alert Pain management: pain level controlled Vital Signs Assessment: post-procedure vital signs reviewed and stable Respiratory status: spontaneous breathing, nonlabored ventilation, respiratory function stable and patient connected to nasal cannula oxygen Cardiovascular status: blood pressure returned to baseline and stable Postop Assessment: no apparent nausea or vomiting Anesthetic complications: no   There were no known notable events for this encounter.  Last Vitals:  Vitals:   10/06/22 1530 10/06/22 1535  BP: (!) 115/57 112/60  Pulse: (!) 58 (!) 57  Resp: 14 17  Temp:    SpO2: (!) 89% 93%    Last Pain:  Vitals:   10/06/22 1521  TempSrc: Temporal  PainSc:                  Weston Nation

## 2022-10-07 ENCOUNTER — Encounter (HOSPITAL_COMMUNITY): Payer: Self-pay | Admitting: Cardiology

## 2022-10-07 MED FILL — Fentanyl Citrate Preservative Free (PF) Inj 100 MCG/2ML: INTRAMUSCULAR | Qty: 2 | Status: AC

## 2022-10-12 ENCOUNTER — Other Ambulatory Visit: Payer: Self-pay | Admitting: Cardiology

## 2022-10-12 ENCOUNTER — Encounter: Payer: Self-pay | Admitting: Cardiology

## 2022-10-12 ENCOUNTER — Ambulatory Visit: Payer: Medicare Other | Attending: Internal Medicine | Admitting: Cardiology

## 2022-10-12 VITALS — BP 130/60 | HR 58 | Ht 70.0 in | Wt 209.0 lb

## 2022-10-12 DIAGNOSIS — I4819 Other persistent atrial fibrillation: Secondary | ICD-10-CM | POA: Diagnosis not present

## 2022-10-12 DIAGNOSIS — I10 Essential (primary) hypertension: Secondary | ICD-10-CM | POA: Diagnosis not present

## 2022-10-12 DIAGNOSIS — R079 Chest pain, unspecified: Secondary | ICD-10-CM

## 2022-10-12 DIAGNOSIS — I5030 Unspecified diastolic (congestive) heart failure: Secondary | ICD-10-CM | POA: Diagnosis not present

## 2022-10-12 DIAGNOSIS — E1169 Type 2 diabetes mellitus with other specified complication: Secondary | ICD-10-CM | POA: Diagnosis not present

## 2022-10-12 DIAGNOSIS — Z01810 Encounter for preprocedural cardiovascular examination: Secondary | ICD-10-CM | POA: Diagnosis not present

## 2022-10-12 DIAGNOSIS — I2510 Atherosclerotic heart disease of native coronary artery without angina pectoris: Secondary | ICD-10-CM

## 2022-10-12 DIAGNOSIS — G4733 Obstructive sleep apnea (adult) (pediatric): Secondary | ICD-10-CM | POA: Diagnosis not present

## 2022-10-12 DIAGNOSIS — E785 Hyperlipidemia, unspecified: Secondary | ICD-10-CM

## 2022-10-12 MED ORDER — NITROGLYCERIN 0.4 MG SL SUBL: 0.4000 mg | SUBLINGUAL_TABLET | SUBLINGUAL | 0 refills | Status: AC | PRN

## 2022-10-12 NOTE — Patient Instructions (Signed)
Medication Instructions:  Your physician recommends that you continue on your current medications as directed. Please refer to the Current Medication list given to you today.  *If you need a refill on your cardiac medications before your next appointment, please call your pharmacy*  Lab Work: -None ordered  Testing/Procedures: Your provider has ordered a Lexiscan/ Exercise Myoview Stress test. This will take place at [facility]. Please report to the [facility] medical mall entrance. The volunteers at the first desk will direct you where to go.  [facility] MYOVIEW  Your provider has ordered a Stress Test with nuclear imaging. The purpose of this test is to evaluate the blood supply to your heart muscle. This procedure is referred to as a "Non-Invasive Stress Test." This is because other than having an IV started in your vein, nothing is inserted or "invades" your body. Cardiac stress tests are done to find areas of poor blood flow to the heart by determining the extent of coronary artery disease (CAD). Some patients exercise on a treadmill, which naturally increases the blood flow to your heart, while others who are unable to walk on a treadmill due to physical limitations will have a pharmacologic/chemical stress agent called Lexiscan . This medicine will mimic walking on a treadmill by temporarily increasing your coronary blood flow.   Please note: these test may take anywhere between 2-4 hours to complete  How to prepare for your Myoview test:  Nothing to eat for 6 hours prior to the test No caffeine for 24 hours prior to test No smoking 24 hours prior to test. Your medication may be taken with water.  If your doctor stopped a medication because of this test, do not take that medication. Ladies, please do not wear dresses.  Skirts or pants are appropriate. Please wear a short sleeve shirt. No perfume, cologne or lotion. Wear comfortable walking shoes. No heels!  PLEASE NOTIFY THE OFFICE AT LEAST 24 HOURS IN  ADVANCE IF YOU ARE UNABLE TO KEEP YOUR APPOINTMENT.  [phone] AND  PLEASE NOTIFY NUCLEAR MEDICINE AT [facility] AT LEAST 24 HOURS IN ADVANCE IF YOU ARE UNABLE TO KEEP YOUR APPOINTMENT. [phone]   Follow-Up: At [facility], you and your health needs are our priority.  As part of our continuing mission to provide you with exceptional heart care, we have created designated Provider Care Teams.  These Care Teams include your primary Cardiologist (physician) and Advanced Practice Providers (APPs -  Physician Assistants and Nurse Practitioners) who all work together to provide you with the care you need, when you need it.  Your next appointment:   After testing   Provider:   You may see Yvonne Kendall, MD or one of the following Advanced Practice Providers on your designated Care Team:   Nicolasa Ducking, NP Eula Listen, PA-C Cadence Fransico Michael, PA-C Charlsie Quest, NP    Other Instructions -None

## 2022-10-12 NOTE — Progress Notes (Unsigned)
Cardiology Office Note:  .   Date:  10/12/2022  ID:  Walter Salas, DOB 04-27-1955, MRN [id number] PCP: Glori Luis, MD  [facility] Providers Cardiologist:  Yvonne Kendall, MD Electrophysiologist:  Will Jorja Loa, MD { Click to update primary MD,subspecialty MD or APP then REFRESH:1}   History of Present Illness: .   Walter Salas is a 67 y.o. male with a past medical history of hypertension, CAD (PCI/DES to left circumflex and RCA at the [place] in 2014), TAA, OSA intolerant to CPAP, GERD, type 2 diabetes, hyperlipidemia, CVA in 2023, agent orange exposure, HFpEF, mild aortic root dilatation, tobacco use, mechanical fall with ICH, who is here today for follow-up.  Previously underwent successful PCI/DES to left circumflex and RCA in 2014 at [place].  Had establish care with Dr. Okey Dupre in 2018 after several syncopal episodes.  Myocardial perfusion study was overall low risk with a small defect of mild severity and suggestive of infarct without ischemia.  Echo showed EF 60-65%, G1 DD, aortic root was mildly dilated 42 mm, and ascending aorta was mildly dilated at 38 mm.  A 30-day event monitor revealed predominantly sinus rhythm with first-degree AV block and no significant arrhythmias or pauses.  Repeat echocardiogram in 02/17/2022 revealed EF of 55 to 60%, mild asymmetric LVH of the basal septal segment, G2 DD, borderline aortic root dilatation at 39 mm, mild dilatation of the ascending aorta at 40 mm.  He was admitted in 02/2021 with a lunar infarct of the left thalamus, CTA head and neck showed mild to moderate atherosclerosis without large vessel occlusion.  He was admitted to the [facility] on 04/08/2022 after several days of worsening shortness of breath, orthopnea, chest pain.  He had been out of his chlorthalidone x 7 days.  He was found to be in new onset atrial fibrillation, volume overloaded with a BNP of 241.  High-sensitivity troponin was 27 and 20.  Chest x-ray showed mild pulmonary  edema.  Echocardiogram showed EF of 55%, moderate concentric LVH, G1 DD, trivial MR, he was transferred to [facility] for continued care.  He underwent TEE/DCCV x 1 to 200 J with conversion to sinus bradycardia approximately 50 bpm.  He was started on apixaban 5 mg twice daily which was and was ultimately discharged in sinus rhythm on 04/10/2022.  He was last seen by EP 06/01/2022 for persistent atrial fibrillation with a CHA2DS2-VASc score of at least 7 currently maintaining in atrial fibrillation on amiodarone and apixaban.  He was planned for cardioversion and he preferred not to be on long-term antiarrhythmics.  Stated that he was planned to have an ablation procedure.  On 06/05/2022 he underwent DCCV and was shocked and 150 J and converted to normal sinus rhythm without any apparent complications.  He had normal neurostatus and respiratory status postprocedure with stable vital signs.  Repeat echocardiogram completed 09/11/2022 revealed an LVEF of 60 to 65%, no RWMA, G2 DD, and small pericardial effusion with no evidence of cardiac tamponade. He presented again to [facility] 10/06/22  to undergo ablation procedure for persistent atrial fibrillation.  He maintained sinus rhythm upon presentation.  And underwent successful electrical isolation and anatomical encircling of all 4 pulmonary veins with pulsed field ablation, ablation of posterior wall with pulsed field ablation with no early apparent complications.  He returns to clinic today stating that overall her has been doing well.  He just recently underwent ablation procedure on 10/06/2022.  He continues to complain of  chronic back pain where he came in today with a message from neurosurgery at the [place] with a planning for a posterior cervical fusion from C2-T2 with screws and rods, and also stated he needed to be off of all anticoagulants and antiplatelets before surgery and for 2 weeks postoperatively, blood loss would be approximately 300 mls.   He denies any chest pain, palpitations, shortness of breath, or peripheral edema.  He denies any bleeding with no blood noted in his urine or stool.  States that he has been compliant with his apixaban.  ROS: 10 point review of systems has been reviewed and considered negative with exception what is been listed in HPI  Studies Reviewed: Marland Kitchen   EKG Interpretation Date/Time:  Monday October 12 2022 11:05:52 EDT Ventricular Rate:  58 PR Interval:  248 QRS Duration:  90 QT Interval:  508 QTC Calculation: 498 R Axis:   25  Text Interpretation: Sinus bradycardia with 1st degree A-V block Nonspecific T wave abnormality Prolonged QT When compared with ECG of 06-Oct-2022 15:22, Nonspecific T wave abnormality now evident in Inferior leads Inverted T waves have replaced nonspecific T wave abnormality in Anterior leads Confirmed by Charlsie Quest ([id number]) on 10/12/2022 11:15:44 AM    A-Fib Ablation 10/06/22 CONCLUSIONS: 1. Sinus rhythm upon presentation.   2. Successful electrical isolation and anatomical encircling of all four pulmonary veins with pulse field ablation. 3. Ablation of posterior wall with pulse field ablation 4. No early apparent complications.  Cardiac CT 09/29/2022 IMPRESSION: 1. There is normal pulmonary vein drainage into the left atrium.   2. The left atrial appendage is a chicken wing type with ostial size 32 x 25 mm and length 37 mm, Area 53 mm2. There is no thrombus in the left atrial appendage.   3. The esophagus runs in the left atrial midline and is not in the proximity to any of the pulmonary veins.  TTE 09/11/22 1. Left ventricular ejection fraction, by estimation, is 60 to 65%. The  left ventricle has normal function. The left ventricle has no regional  wall motion abnormalities. There is mild left ventricular hypertrophy.  Left ventricular diastolic parameters  are consistent with Grade II diastolic dysfunction (pseudonormalization).   2. Right ventricular  systolic function is normal. The right ventricular  size is normal. Tricuspid regurgitation signal is inadequate for assessing  PA pressure.   3. Left atrial size was moderately dilated.   4. A small pericardial effusion is present estimated 1.29 cm off the LV  free wall. Effusion appears less off RV. There is no evidence of cardiac  tamponade.   5. The mitral valve is normal in structure. No evidence of mitral valve  regurgitation. No evidence of mitral stenosis.   6. The aortic valve appears tricuspid though not well visualized. Aortic  valve regurgitation is not visualized. Aortic valve sclerosis is present,  with no evidence of aortic valve stenosis. Aortic valve mean gradient  measures 5.0 mmHg.   7. There is mild dilatation of the aortic root, measuring 43 mm. There is  mild dilatation of the ascending aorta, measuring 42 mm.   8. The inferior vena cava is normal in size with greater than 50%  respiratory variability, suggesting right atrial pressure of 3 mmHg.   TTE 02/17/22  Review of the above records today demonstrates:   1. Left ventricular ejection fraction, by estimation, is 55 to 60%. The  left ventricle has normal function. The left ventricle has no regional  wall motion abnormalities.  There is mild asymmetric left ventricular  hypertrophy of the basal-septal segment.  Left ventricular diastolic parameters are consistent with Grade II  diastolic dysfunction (pseudonormalization).   2. Right ventricular systolic function is normal. The right ventricular  size is normal. Tricuspid regurgitation signal is inadequate for assessing  PA pressure.   3. Left atrial size was mildly dilated.   4. The mitral valve is normal in structure. No evidence of mitral valve  regurgitation. No evidence of mitral stenosis.   5. The aortic valve is normal in structure. Aortic valve regurgitation is  not visualized. No aortic stenosis is present.   6. There is borderline dilatation of the  aortic root, measuring 39 mm.  There is mild dilatation of the ascending aorta, measuring 40 mm.   7. The inferior vena cava is normal in size with greater than 50%  respiratory variability, suggesting right atrial pressure of 3 mmHg  Risk Assessment/Calculations:    CHA2DS2-VASc Score = 7  {Confirm score is correct.  If not, click here to update score.  REFRESH note.  :1} This indicates a 11.2% annual risk of stroke. The patient's score is based upon: CHF History: 1 HTN History: 1 Diabetes History: 1 Stroke History: 2 Vascular Disease History: 1 Age Score: 1 Gender Score: 0   {This patient has a significant risk of stroke if diagnosed with atrial fibrillation.  Please consider VKA or DOAC agent for anticoagulation if the bleeding risk is acceptable.   You can also use the SmartPhrase .[email or web address] for documentation.   :[id number]}     STOP-Bang Score:  4  { Consider Dx Sleep Disordered Breathing or Sleep Apnea  ICD G47.33          :1}    Physical Exam:   VS:  BP 130/60 (BP Location: Left Arm, Patient Position: Sitting, Cuff Size: Normal)   Pulse (!) 58   Ht 5\' 10"  (1.778 m)   Wt 209 lb (94.8 kg)   SpO2 97%   BMI 29.99 kg/m    Wt Readings from Last 3 Encounters:  10/12/22 209 lb (94.8 kg)  10/06/22 205 lb (93 kg)  09/09/22 201 lb 6.4 oz (91.4 kg)    GEN: Well nourished, well developed in no acute distress NECK: No JVD; No carotid bruits CARDIAC: RRR, I/VI systolic murmur without rubs or gallops RESPIRATORY:  Clear to auscultation without rales, wheezing or rhonchi  ABDOMEN: Soft, non-tender, non-distended EXTREMITIES:  No edema; No deformity   ASSESSMENT AND PLAN: .       Informed Consent   Shared Decision Making/Informed Consent{ All outpatient stress tests require an informed consent ([id number]) ATTESTATION ORDER       :[id number]} The risks [chest pain, shortness of breath, cardiac arrhythmias, dizziness, blood pressure fluctuations, myocardial infarction,  stroke/transient ischemic attack, nausea, vomiting, allergic reaction, radiation exposure, metallic taste sensation and life-threatening complications (estimated to be 1 in 10,000)], benefits (risk stratification, diagnosing coronary artery disease, treatment guidance) and alternatives of a nuclear stress test were discussed in detail with Mr. Hula and he agrees to proceed.    {Click Here to Calculate RCRI      :[id number]}  { Click Here to Calculate DASI      :[id number]} Mr. Arcilla's perioperative risk of a major cardiac event is 11% according to the Revised Cardiac Risk Index (RCRI).  Therefore, he is at high risk for perioperative complications.   His functional capacity is fair at 4.64 METs according to the [facility] Activity Status Index (DASI). Recommendations:  According to ACC/AHA guidelines, no further cardiovascular testing needed.  The patient may proceed to surgery at acceptable risk.   Antiplatelet and/or Anticoagulation Recommendations: {Antiplatelet Recommendations                  :[id number]} {Anticoagulation Recommendations           :[id number]}   Dispo: Patient to return to clinic to see MD/APP  Signed, Kelsi Benham, NP

## 2022-10-13 ENCOUNTER — Telehealth: Payer: Self-pay | Admitting: *Deleted

## 2022-10-13 NOTE — Telephone Encounter (Signed)
Spoke to pt that we had received some information that the [place] wanted to perform spinal surgery in Oct/Nov. Advised/reminded that he cannot hold his blood thinner for 3 month post ablation (ablation was just last week). Aware I will reach out to the [place] in [place] to discuss/inform them. ([facility] MD: Erskine Emery, [phone], Ext [phone])

## 2022-10-19 ENCOUNTER — Other Ambulatory Visit: Payer: Medicare Other

## 2022-10-23 ENCOUNTER — Telehealth: Payer: Self-pay | Admitting: *Deleted

## 2022-10-23 ENCOUNTER — Other Ambulatory Visit: Payer: Self-pay | Admitting: Family Medicine

## 2022-10-23 DIAGNOSIS — R1111 Vomiting without nausea: Secondary | ICD-10-CM

## 2022-10-23 DIAGNOSIS — G8929 Other chronic pain: Secondary | ICD-10-CM

## 2022-10-23 NOTE — Telephone Encounter (Signed)
Irean Hong, NP from the [place] returned my call. Advised that pt recently underwent an afib ablation 9/24 and that he cannot hold his Eliquis for 3 months afterwards. Advised that they can schedule his needed neuro surgery anytime after christmas. Informed that pt follows up w/ Dr. Elberta Fortis on 01/21/23 for his post ablation follow up, in case they want to schedule procedure after this date to ensure ok to move forward. Denny Peon appreciates our reaching out and relaying this important information.  They will aim for surgery beginning of next year.

## 2022-10-23 NOTE — Telephone Encounter (Signed)
Left message at [facility]

## 2022-10-23 NOTE — Telephone Encounter (Signed)
-----   Message from Will [facility] sent at 10/13/2022  9:48 AM EDT ----- Thanks for the update. Walter Salas can you find out what procedure he needs to have. ----- Message ----- From: Charlsie Quest, NP Sent: 10/13/2022   7:27 AM EDT To: Regan Lemming, MD  Neurosurgery is requesting to hold all antiplatelet and anticoagulants prior to upcoming procedure in 2 weeks postprocedure.  What is the recommended timeframe to continue him on his anticoagulation post cardioversion and ablation that was just completed 10/02/2022.  [facility] advised [facility] that he will likely not be ready to hold these medications for an extended period of time next month.

## 2022-10-26 ENCOUNTER — Ambulatory Visit (HOSPITAL_COMMUNITY)
Admission: RE | Admit: 2022-10-26 | Discharge: 2022-10-26 | Disposition: A | Discharge status: Home / Self Care | Payer: Medicare Other | Source: Ambulatory Visit | Attending: Internal Medicine | Admitting: Internal Medicine

## 2022-10-26 VITALS — BP 162/60 | HR 60 | Ht 70.0 in | Wt 205.0 lb

## 2022-10-26 DIAGNOSIS — D6869 Other thrombophilia: Secondary | ICD-10-CM | POA: Diagnosis not present

## 2022-10-26 DIAGNOSIS — G4733 Obstructive sleep apnea (adult) (pediatric): Secondary | ICD-10-CM | POA: Insufficient documentation

## 2022-10-26 DIAGNOSIS — Z79899 Other long term (current) drug therapy: Secondary | ICD-10-CM

## 2022-10-26 DIAGNOSIS — Z8673 Personal history of transient ischemic attack (TIA), and cerebral infarction without residual deficits: Secondary | ICD-10-CM | POA: Insufficient documentation

## 2022-10-26 DIAGNOSIS — I2510 Atherosclerotic heart disease of native coronary artery without angina pectoris: Secondary | ICD-10-CM | POA: Insufficient documentation

## 2022-10-26 DIAGNOSIS — K219 Gastro-esophageal reflux disease without esophagitis: Secondary | ICD-10-CM | POA: Diagnosis not present

## 2022-10-26 DIAGNOSIS — I110 Hypertensive heart disease with heart failure: Secondary | ICD-10-CM | POA: Insufficient documentation

## 2022-10-26 DIAGNOSIS — Z5181 Encounter for therapeutic drug level monitoring: Secondary | ICD-10-CM | POA: Diagnosis not present

## 2022-10-26 DIAGNOSIS — I4819 Other persistent atrial fibrillation: Secondary | ICD-10-CM | POA: Diagnosis not present

## 2022-10-26 DIAGNOSIS — I5030 Unspecified diastolic (congestive) heart failure: Secondary | ICD-10-CM | POA: Insufficient documentation

## 2022-10-26 DIAGNOSIS — Z7901 Long term (current) use of anticoagulants: Secondary | ICD-10-CM | POA: Diagnosis not present

## 2022-10-26 DIAGNOSIS — Z955 Presence of coronary angioplasty implant and graft: Secondary | ICD-10-CM | POA: Insufficient documentation

## 2022-10-26 DIAGNOSIS — R9431 Abnormal electrocardiogram [ECG] [EKG]: Secondary | ICD-10-CM | POA: Diagnosis not present

## 2022-10-26 DIAGNOSIS — E1122 Type 2 diabetes mellitus with diabetic chronic kidney disease: Secondary | ICD-10-CM | POA: Insufficient documentation

## 2022-10-26 NOTE — Progress Notes (Signed)
Primary Care Physician: Glori Luis, MD Primary Cardiologist: Yvonne Kendall, MD Electrophysiologist: Will Jorja Loa, MD     Referring Physician: Dr. Carmie Kanner is a 67 y.o. male with a history of HTN, CAD s/p PCI to circumflex and RCA in 2014, OSA intolerant of CPAP, GERD, T2DM, HFpEF, CVA, and persistent atrial fibrillation who presents for consultation in the [facility]. S/p Afib ablation on 10/06/22 by Dr. Elberta Fortis. He takes amiodarone 200 mg daily. Patient is on Eliquis for a CHADS2VASC score of at least 6.  On evaluation today, he is currently in NSR. No episodes of Afib since ablation. No chest pain, SOB, or trouble swallowing. Leg sites healed without issue. No missed doses of anticoagulant.  Today, he denies symptoms of orthopnea, PND, lower extremity edema, dizziness, presyncope, syncope, snoring, daytime somnolence, bleeding, or neurologic sequela. The patient is tolerating medications without difficulties and is otherwise without complaint today.   he has a BMI of Body mass index is 29.41 kg/m.Marland Kitchen Filed Weights   10/26/22 0858  Weight: 93 kg    Current Outpatient Medications  Medication Sig Dispense Refill   amiodarone (PACERONE) 200 MG tablet Take 2 tablets by mouth twice a day for 2 weeks then reduce to 1 tablet by mouth twice a day (Patient taking differently: Take 200 mg by mouth daily.) 290 tablet 3   amLODipine (NORVASC) 2.5 MG tablet TAKE ONE TABLET (2.5 MG) BY MOUTH EVERY DAY 90 tablet 0   apixaban (ELIQUIS) 5 MG TABS tablet Take 5 mg by mouth 2 (two) times daily.     aspirin 81 MG EC tablet Take 81 mg by mouth daily.     atorvastatin (LIPITOR) 80 MG tablet Take 80 mg by mouth daily.     carbidopa-levodopa (SINEMET IR) 25-100 MG tablet Take 1 tablet by mouth 3 (three) times daily.     chlorthalidone (HYGROTON) 50 MG tablet Take 50 mg by mouth daily.     cholecalciferol (VITAMIN D3) 25 MCG (1000 UNIT) tablet  Take 4,000 Units by mouth daily.     DULoxetine (CYMBALTA) 20 MG capsule Take 20 mg by mouth daily.     ezetimibe (ZETIA) 10 MG tablet TAKE 1 TABLET BY MOUTH DAILY. 90 tablet 3   fenofibrate (TRICOR) 145 MG tablet Take 1 tablet (145 mg total) by mouth daily. 30 tablet 0   furosemide (LASIX) 20 MG tablet Take 1 tablet (20 mg total) by mouth daily as needed (for weight gain > 3 lbs over night or > 5 lbs/week.). 30 tablet 4   glucose blood test strip Check once daily, E11.9 100 each 12   losartan (COZAAR) 25 MG tablet Take 25 mg by mouth daily.     metFORMIN (GLUCOPHAGE) 1000 MG tablet Take 1,000 mg by mouth 2 (two) times daily with a meal.     methyl salicylate liquid Apply 1 application topically as needed for muscle pain.     nitroGLYCERIN (NITROSTAT) 0.4 MG SL tablet Place 1 tablet (0.4 mg total) under the tongue every 5 (five) minutes as needed for chest pain. May take up to 3 doses. 30 tablet 0   omeprazole (PRILOSEC) 20 MG capsule Take 20 mg by mouth daily.     ondansetron (ZOFRAN) 4 MG tablet TAKE 1 TABLET BY MOUTH EVERY 8 HOURS AS NEEDED. 20 tablet 0   polyvinyl alcohol (LIQUIFILM TEARS) 1.4 % ophthalmic solution Place 1 drop into both eyes as needed for  dry eyes.      potassium chloride SA (K-DUR,KLOR-CON) 20 MEQ tablet Take 1 tablet (20 mEq total) by mouth daily. 90 tablet 3   pregabalin (LYRICA) 75 MG capsule Take 75 mg by mouth 3 (three) times daily.     tamsulosin (FLOMAX) 0.4 MG CAPS capsule TAKE 1 CAPSULE BY MOUTH ONCE DAILY 30 capsule 3   traMADol (ULTRAM) 50 MG tablet TAKE TWO TABLETS BY MOUTH EVERY 8 HOURS AS NEEDED FOR PAIN 90 tablet 0   VASCEPA 1 g capsule Take 2 g by mouth 2 (two) times daily.     vitamin B-12 (CYANOCOBALAMIN) 1000 MCG tablet Take 1 tablet (1,000 mcg total) by mouth daily. 30 tablet 0   No current facility-administered medications for this encounter.    Atrial Fibrillation Management history:  Previous antiarrhythmic drugs: amiodarone Previous  cardioversions: 06/05/22 Previous ablations: 10/06/22 Anticoagulation history: Eliquis   ROS- All systems are reviewed and negative except as per the HPI above.  Physical Exam: BP (!) 162/60   Pulse 60   Ht 5\' 10"  (1.778 m)   Wt 93 kg   BMI 29.41 kg/m   GEN: Well nourished, well developed in no acute distress NECK: No JVD; No carotid bruits CARDIAC: Regular rate and rhythm, no murmurs, rubs, gallops RESPIRATORY:  Clear to auscultation without rales, wheezing or rhonchi  ABDOMEN: Soft, non-tender, non-distended EXTREMITIES:  No edema; No deformity   EKG today demonstrates  Vent. rate 60 BPM PR interval 208 ms QRS duration 90 ms QT/QTcB 488/488 ms P-R-T axes 28 81 54 Normal sinus rhythm Prolonged QT Abnormal ECG When compared with ECG of 12-Oct-2022 11:05, PREVIOUS ECG IS PRESENT  Echo 09/11/22 demonstrated  1. Left ventricular ejection fraction, by estimation, is 60 to 65%. The  left ventricle has normal function. The left ventricle has no regional  wall motion abnormalities. There is mild left ventricular hypertrophy.  Left ventricular diastolic parameters  are consistent with Grade II diastolic dysfunction (pseudonormalization).   2. Right ventricular systolic function is normal. The right ventricular  size is normal. Tricuspid regurgitation signal is inadequate for assessing  PA pressure.   3. Left atrial size was moderately dilated.   4. A small pericardial effusion is present estimated 1.29 cm off the LV  free wall. Effusion appears less off RV. There is no evidence of cardiac  tamponade.   5. The mitral valve is normal in structure. No evidence of mitral valve  regurgitation. No evidence of mitral stenosis.   6. The aortic valve appears tricuspid though not well visualized. Aortic  valve regurgitation is not visualized. Aortic valve sclerosis is present,  with no evidence of aortic valve stenosis. Aortic valve mean gradient  measures 5.0 mmHg.   7. There is mild  dilatation of the aortic root, measuring 43 mm. There is  mild dilatation of the ascending aorta, measuring 42 mm.   8. The inferior vena cava is normal in size with greater than 50%  respiratory variability, suggesting right atrial pressure of 3 mmHg.    ASSESSMENT & PLAN CHA2DS2-VASc Score = 7  The patient's score is based upon: CHF History: 1 HTN History: 1 Diabetes History: 1 Stroke History: 2 Vascular Disease History: 1 Age Score: 1 Gender Score: 0       ASSESSMENT AND PLAN: Persistent Atrial Fibrillation (ICD10:  I48.19) The patient's CHA2DS2-VASc score is 7, indicating a 11.2% annual risk of stroke.   S/p Afib ablation on 10/06/22 by Dr. Elberta Fortis.  He is currently in  NSR.  Qtc stable. Continue amiodarone 200 mg daily.   Secondary Hypercoagulable State (ICD10:  D68.69) The patient is at significant risk for stroke/thromboembolism based upon his CHA2DS2-VASc Score of 7.  Continue Apixaban (Eliquis).  No missed doses.    Follow up as scheduled with Dr. Elberta Fortis.    Lake Bells, PA-C  [facility] [address] [place], [place] [address] [phone]

## 2022-10-30 NOTE — Progress Notes (Unsigned)
Cardiology Office Note:  .   Date:  11/03/2022  ID:  Cameron Proud, DOB 1955-07-21, MRN [id number] PCP: Glori Luis, MD  [facility] Providers Cardiologist:  Yvonne Kendall, MD Electrophysiologist:  Regan Lemming, MD    History of Present Illness: .   ZAYA SPEKTOR is a 67 y.o. male with a past medical history of hypertension, CAD (PCI/DES to left circumflex and RCA at the [place] in 2014), TAA, OSA intolerant of CPAP, GERD, type 2 diabetes, hyperlipidemia, CVA in 2013, Agent Orange exposure, HFpEF, mild aortic root dilatation, tobacco use, mechanical fall with ICH, and persistent atrial fibrillation, who is here today for follow-up.  Underwent successful PCI/DES to the left circumflex and RCA in 2014 at the [place].  Establish care with Dr. Okey Dupre in 2018 after several syncopal episodes.  Myocardial perfusion study was overall low risk with a small defect of mild severity and suggestive of infarct without ischemia.  Echocardiogram revealed an EF of 60-65%, G1 DD, aortic root was mildly dilated 42 mm and ascending aorta was mildly dilated at 38 mm.  A 30-day event monitor revealed predominantly sinus rhythm and first-degree AV block and no significant arrhythmias or pauses.  Repeat echocardiogram 02/17/2022 revealed an EF of 55 to 60%, mild asymmetric LVH in the basal septal segment, G2 DD, borderline aortic root dilatation at 39 mm, mild dilatation of the ascending aorta at 40 mm.  He was admitted 02/2021 with a lunar infarct of the left sounds, CT of the head and neck showed mild to moderate atherosclerosis without large vessel occlusion.  Admitted to [facility] on 04/08/2022 and found to be in new onset atrial fibrillation, volume overloaded with a BNP of 241.  Echocardiogram revealed an EF of 55%, moderate concentric LVH, G1 DD, trivial MR, and he was transferred to the [facility] for continued care.  He underwent TEE/DCCV x 1 at 200 J with conversion to sinus bradycardia approximately 50  bpm.  He was started on apixaban 5 mg twice daily and was ultimately discharged in sinus rhythm on 04/10/2022.  He was followed by EP and was planned to have an ablation procedure.  On 06/05/2019 with DCCV and was shocked 150 J converted to normal sinus rhythm without any apparent complications.  Repeat echocardiogram completed 09/11/2022 revealed an LVEF of 60 to 65%, no RWMA, G2 DD, and a small pericardial effusion with no evidence of cardiac tamponade.  He then presented again [facility] 10/06/2022 and undergo ablation procedure for persistent atrial fibrillation.  He maintained sinus rhythm upon presentation.  And underwent successful electrical isolation and anatomical encircling of all 4 pulmonary veins with pulsed field ablation, ablation of posterior wall with pulsed field ablation with no early apparent complications.  He was last seen in clinic 10/12/2019 for stated he had recently undergone his ablation procedure.  He continued to complain of chronic back pain and had a message from neurosurgery at the [facility] was planning for posterior cervical fusion from C2-T2 with screws and rods and stated that he would need to be off all anticoagulation and antiplatelets before surgery for 2 weeks postoperatively as well.  He was concerned about his cardiovascular risk after talking to the surgeon from the [place] and was scheduled for Lexiscan to rule out any ischemia or infarct prior to prolonged general anesthesia.  We also reached out to EP to determine the timeframe of when he was considered stable for him to be off of his anticoagulations post procedure.  Patient ultimately called and canceled his Myoview stating his surgery had been canceled.  He was evaluated in A-fib clinic 10/26/2022.  He had continued to maintain sinus rhythm with no episodes of A-fib since his ablation.  Denied any chest pain, shortness of breath or trouble swallowing.  He also stated he had not missed any doses of his anticoagulant.  He  was continued on his apixaban and amiodarone.  He returns to clinic today with various complaints and concerns about his current medication regimen associated to be some confusion on his amiodarone dosing.  He denies any chest pain, worsening shortness of breath.  He does complain of fatigue, claudication to his bilateral lower extremities and admits walking a couple of minutes with stopping.  He also states that he had been out of his amlodipine for approximately a month and they have been noted several refills sent by the pharmacy with no response to 3 separate offices which she thinks is unusual because he never had that happen before.  He states that he has had no side effects from this medication.  Previously was scheduled for a [employer] which was canceled because he stated that his surgery had been canceled until the beginning the year.  He denies any bleeding issues and no noted blood in his stool or urine and has been compliant with no missed doses of his apixaban.  Denies any hospitalizations or visits to the emergency department.  ROS: 10 point review of systems has been reviewed and considered negative with exception of what is been listed in the HPI  Studies Reviewed: Marland Kitchen   EKG Interpretation Date/Time:  Tuesday November 03 2022 14:42:56 EDT Ventricular Rate:  59 PR Interval:  232 QRS Duration:  96 QT Interval:  578 QTC Calculation: 572 R Axis:   49  Text Interpretation: Sinus bradycardia with 1st degree A-V block Low voltage QRS When compared with ECG of 26-Oct-2022 09:04, QT has lengthened Confirmed by Charlsie Quest ([id number]) on 11/03/2022 3:23:10 PM    A-Fib Ablation 10/06/22 CONCLUSIONS: 1. Sinus rhythm upon presentation.   2. Successful electrical isolation and anatomical encircling of all four pulmonary veins with pulse field ablation. 3. Ablation of posterior wall with pulse field ablation 4. No early apparent complications.   Cardiac CT 09/29/2022 IMPRESSION: 1. There  is normal pulmonary vein drainage into the left atrium.   2. The left atrial appendage is a chicken wing type with ostial size 32 x 25 mm and length 37 mm, Area 53 mm2. There is no thrombus in the left atrial appendage.   3. The esophagus runs in the left atrial midline and is not in the proximity to any of the pulmonary veins.   TTE 09/11/22 1. Left ventricular ejection fraction, by estimation, is 60 to 65%. The  left ventricle has normal function. The left ventricle has no regional  wall motion abnormalities. There is mild left ventricular hypertrophy.  Left ventricular diastolic parameters  are consistent with Grade II diastolic dysfunction (pseudonormalization).   2. Right ventricular systolic function is normal. The right ventricular  size is normal. Tricuspid regurgitation signal is inadequate for assessing  PA pressure.   3. Left atrial size was moderately dilated.   4. A small pericardial effusion is present estimated 1.29 cm off the LV  free wall. Effusion appears less off RV. There is no evidence of cardiac  tamponade.   5. The mitral valve is normal in structure. No evidence of mitral valve  regurgitation. No evidence of  mitral stenosis.   6. The aortic valve appears tricuspid though not well visualized. Aortic  valve regurgitation is not visualized. Aortic valve sclerosis is present,  with no evidence of aortic valve stenosis. Aortic valve mean gradient  measures 5.0 mmHg.   7. There is mild dilatation of the aortic root, measuring 43 mm. There is  mild dilatation of the ascending aorta, measuring 42 mm.   8. The inferior vena cava is normal in size with greater than 50%  respiratory variability, suggesting right atrial pressure of 3 mmHg.    TTE 02/17/22  Review of the above records today demonstrates:   1. Left ventricular ejection fraction, by estimation, is 55 to 60%. The  left ventricle has normal function. The left ventricle has no regional  wall motion  abnormalities. There is mild asymmetric left ventricular  hypertrophy of the basal-septal segment.  Left ventricular diastolic parameters are consistent with Grade II  diastolic dysfunction (pseudonormalization).   2. Right ventricular systolic function is normal. The right ventricular  size is normal. Tricuspid regurgitation signal is inadequate for assessing  PA pressure.   3. Left atrial size was mildly dilated.   4. The mitral valve is normal in structure. No evidence of mitral valve  regurgitation. No evidence of mitral stenosis.   5. The aortic valve is normal in structure. Aortic valve regurgitation is  not visualized. No aortic stenosis is present.   6. There is borderline dilatation of the aortic root, measuring 39 mm.  There is mild dilatation of the ascending aorta, measuring 40 mm.   7. The inferior vena cava is normal in size with greater than 50%  respiratory variability, suggesting right atrial pressure of 3 mmHg  Risk Assessment/Calculations:    CHA2DS2-VASc Score = 7   This indicates a 11.2% annual risk of stroke. The patient's score is based upon: CHF History: 1 HTN History: 1 Diabetes History: 1 Stroke History: 2 Vascular Disease History: 1 Age Score: 1 Gender Score: 0         Physical Exam:   VS:  BP (!) 128/58 (BP Location: Left Arm, Patient Position: Sitting, Cuff Size: Normal)   Pulse (!) 59   Ht 5\' 10"  (1.778 m)   Wt 204 lb 12.8 oz (92.9 kg)   SpO2 95%   BMI 29.39 kg/m    Wt Readings from Last 3 Encounters:  11/03/22 204 lb 12.8 oz (92.9 kg)  10/26/22 205 lb (93 kg)  10/12/22 209 lb (94.8 kg)    GEN: Well nourished, well developed in no acute distress NECK: No JVD; No carotid bruits CARDIAC: RRR, I/VI systolic murmur without rubs or gallops RESPIRATORY:  Clear to auscultation without rales, wheezing or rhonchi  ABDOMEN: Soft, non-tender, non-distended EXTREMITIES:  trace pretibial edema; No deformity   ASSESSMENT AND PLAN: .   Coronary  artery disease with previous PCI/DES to left circumflex and RCA at the [place] in 2014.  Denies any current anginal or anginal equivalents.  EKG today reveals sinus bradycardia with a rate of 59 with first-degree AV block and an elongating QTc.  He has been continued on aspirin 81 mg daily, ezetimibe 10 mg daily, fenofibrate 45 mg daily, Vascepa 2 g twice daily.  Previous Lexiscan Myoview was canceled by the patient as his surgery was canceled at the [place] due to recent A-fib ablation.  HFpEF with last echocardiogram completed 08/2022 with an LVEF of 60 to 65%.  Remains euvolemic on exam.  Denies any shortness of breath.  Tries  to weigh himself at home on a daily basis.  He is continued on furosemide 20 mg as needed for weight gain, losartan 25 mg daily, chlorthalidone 50 mg daily.   Primary hypertension with blood pressure 128/58.  Blood pressure remained stable.  He is continued on amlodipine 2.5 mg daily, chlorthalidone 50 mg daily, losartan 25 mg daily.  Encouraged to monitor blood pressures at home 1 to 2 hours postmedication administration.  Mixed hyperlipidemia with associated type 2 diabetes where he is continued on atorvastatin 80 mg daily, ezetimibe 10 mg daily, fenofibrate 145 mg daily.  This continues to be managed by his PCP.  Persistent atrial fibrillation status post DCCV that was successful and status post ablation.  EKG today reveals sinus bradycardia with first-degree AV block and a prolonged QTc that is slightly extended from prior EKG.  Continue amiodarone was discontinued to 200 mg once daily.  He is continue to follow-up with EP for continued management.  EKG is only slightly changed from prior EKG completed today A-fib clinic.  He has been sent for labs today TSH, CMP, CBC, and Mg with extended prolonged Qtc.  Also discussed EKG and treatment with Percell Belt, NP from EP in clinic today.  Obstructive sleep apnea who has been intolerant to CPAP.  He has previously over 30 pound weight loss  continues to suffer from daytime sleepiness.  He continues to be followed by Dr. Mayford Knife.  Intermittent claudication while walking short distances he has pain in the calves of his bilateral lower extremities.  He has been scheduled for an ABI's to evaluate his lower extremity circulation.       Dispo: Patient to return to clinic to see MD/APP once testing is completed.  Also encouraged to follow-up with EP.  Signed, Afsa Meany, NP

## 2022-11-03 ENCOUNTER — Other Ambulatory Visit: Payer: Self-pay

## 2022-11-03 ENCOUNTER — Encounter: Payer: Self-pay | Admitting: Cardiology

## 2022-11-03 ENCOUNTER — Ambulatory Visit: Payer: Medicare Other | Attending: Cardiology | Admitting: Cardiology

## 2022-11-03 VITALS — BP 128/58 | HR 59 | Ht 70.0 in | Wt 204.8 lb

## 2022-11-03 DIAGNOSIS — R9431 Abnormal electrocardiogram [ECG] [EKG]: Secondary | ICD-10-CM

## 2022-11-03 DIAGNOSIS — G4733 Obstructive sleep apnea (adult) (pediatric): Secondary | ICD-10-CM | POA: Diagnosis not present

## 2022-11-03 DIAGNOSIS — E785 Hyperlipidemia, unspecified: Secondary | ICD-10-CM | POA: Diagnosis not present

## 2022-11-03 DIAGNOSIS — I739 Peripheral vascular disease, unspecified: Secondary | ICD-10-CM | POA: Diagnosis not present

## 2022-11-03 DIAGNOSIS — I2510 Atherosclerotic heart disease of native coronary artery without angina pectoris: Secondary | ICD-10-CM | POA: Diagnosis not present

## 2022-11-03 DIAGNOSIS — I5030 Unspecified diastolic (congestive) heart failure: Secondary | ICD-10-CM

## 2022-11-03 DIAGNOSIS — E1169 Type 2 diabetes mellitus with other specified complication: Secondary | ICD-10-CM

## 2022-11-03 DIAGNOSIS — I4819 Other persistent atrial fibrillation: Secondary | ICD-10-CM

## 2022-11-03 DIAGNOSIS — I10 Essential (primary) hypertension: Secondary | ICD-10-CM

## 2022-11-03 MED ORDER — AMIODARONE HCL 200 MG PO TABS: 200.0000 mg | ORAL_TABLET | Freq: Every day | ORAL | 3 refills | Status: DC

## 2022-11-03 MED ORDER — AMLODIPINE BESYLATE 2.5 MG PO TABS: 2.5000 mg | ORAL_TABLET | Freq: Every day | ORAL | 3 refills | Status: DC

## 2022-11-03 NOTE — Patient Instructions (Signed)
Medication Instructions:  Your physician recommends that you continue on your current medications as directed. Please refer to the Current Medication list given to you today.  *If you need a refill on your cardiac medications before your next appointment, please call your pharmacy*  Lab Work: Your provider would like for you to have following labs drawn today CBC, CMET, TSH.   If you have labs (blood work) drawn today and your tests are completely normal, you will receive your results only by: MyChart Message (if you have MyChart) OR A paper copy in the mail If you have any lab test that is abnormal or we need to change your treatment, we will call you to review the results.  Testing/Procedures: Your physician has requested that you have an ankle brachial index (ABI). During this test an ultrasound and blood pressure cuff are used to evaluate the arteries that supply the arms and legs with blood. Allow thirty minutes for this exam. There are no restrictions or special instructions.   Follow-Up: At [facility], you and your health needs are our priority.  As part of our continuing mission to provide you with exceptional heart care, we have created designated Provider Care Teams.  These Care Teams include your primary Cardiologist (physician) and Advanced Practice Providers (APPs -  Physician Assistants and Nurse Practitioners) who all work together to provide you with the care you need, when you need it.  Your next appointment:   Follow-up after testing is completed   Provider:   Charlsie Quest, NP    Other Instructions -None

## 2022-11-04 LAB — COMPREHENSIVE METABOLIC PANEL
ALT: 32 [IU]/L (ref 0–44)
AST: 25 [IU]/L (ref 0–40)
Albumin: 4.8 g/dL (ref 3.9–4.9)
Alkaline Phosphatase: 87 [IU]/L (ref 44–121)
BUN/Creatinine Ratio: 25 — ABNORMAL HIGH (ref 10–24)
BUN: 23 mg/dL (ref 8–27)
Bilirubin Total: 0.4 mg/dL (ref 0.0–1.2)
CO2: 23 mmol/L (ref 20–29)
Calcium: 9.6 mg/dL (ref 8.6–10.2)
Chloride: 97 mmol/L (ref 96–106)
Creatinine, Ser: 0.91 mg/dL (ref 0.76–1.27)
Globulin, Total: 2 g/dL (ref 1.5–4.5)
Glucose: 133 mg/dL — ABNORMAL HIGH (ref 70–99)
Potassium: 3.7 mmol/L (ref 3.5–5.2)
Sodium: 140 mmol/L (ref 134–144)
Total Protein: 6.8 g/dL (ref 6.0–8.5)
eGFR: 92 mL/min/{1.73_m2} (ref >59)

## 2022-11-04 LAB — CBC
Hematocrit: 42.4 % (ref 37.5–51.0)
Hemoglobin: 14.2 g/dL (ref 13.0–17.7)
MCH: 28.5 pg (ref 26.6–33.0)
MCHC: 33.5 g/dL (ref 31.5–35.7)
MCV: 85 fL (ref 79–97)
Platelets: 197 10*3/uL (ref 150–450)
RBC: 4.98 x10E6/uL (ref 4.14–5.80)
RDW: 13.3 % (ref 11.6–15.4)
WBC: 5.9 10*3/uL (ref 3.4–10.8)

## 2022-11-04 LAB — TSH: TSH: 1.45 u[IU]/mL (ref 0.450–4.500)

## 2022-11-04 LAB — MAGNESIUM: Magnesium: 2.3 mg/dL (ref 1.6–2.3)

## 2022-11-04 NOTE — Progress Notes (Signed)
Labs are all stable. Continue current medication regimen without any changes at this time.

## 2022-11-05 ENCOUNTER — Ambulatory Visit (HOSPITAL_COMMUNITY): Payer: Medicare Other | Admitting: Internal Medicine

## 2022-11-17 ENCOUNTER — Other Ambulatory Visit: Payer: Self-pay | Admitting: Cardiology

## 2022-11-17 DIAGNOSIS — G629 Polyneuropathy, unspecified: Secondary | ICD-10-CM | POA: Diagnosis not present

## 2022-11-17 DIAGNOSIS — I739 Peripheral vascular disease, unspecified: Secondary | ICD-10-CM

## 2022-11-17 DIAGNOSIS — I5030 Unspecified diastolic (congestive) heart failure: Secondary | ICD-10-CM

## 2022-11-17 DIAGNOSIS — Z8673 Personal history of transient ischemic attack (TIA), and cerebral infarction without residual deficits: Secondary | ICD-10-CM | POA: Diagnosis not present

## 2022-11-17 DIAGNOSIS — I10 Essential (primary) hypertension: Secondary | ICD-10-CM

## 2022-11-17 DIAGNOSIS — I2510 Atherosclerotic heart disease of native coronary artery without angina pectoris: Secondary | ICD-10-CM

## 2022-11-17 DIAGNOSIS — I4819 Other persistent atrial fibrillation: Secondary | ICD-10-CM

## 2022-11-17 DIAGNOSIS — R9431 Abnormal electrocardiogram [ECG] [EKG]: Secondary | ICD-10-CM

## 2022-11-17 DIAGNOSIS — G4733 Obstructive sleep apnea (adult) (pediatric): Secondary | ICD-10-CM

## 2022-11-17 DIAGNOSIS — G252 Other specified forms of tremor: Secondary | ICD-10-CM | POA: Diagnosis not present

## 2022-11-17 DIAGNOSIS — E1169 Type 2 diabetes mellitus with other specified complication: Secondary | ICD-10-CM

## 2022-11-20 ENCOUNTER — Encounter: Payer: Self-pay | Admitting: Pharmacist

## 2022-11-20 NOTE — Progress Notes (Addendum)
Pharmacy Quality Measure Review  This patient is appearing on report for being at risk of failing the measure for Statin Use in Persons with Diabetes (SUPD) medications this calendar year. Also has a significant history of clinical ASCVD.   Patient has documented trials of statins with reported myalgia, but no corresponding CPT codes that would exclude patient from SUPD measure.  Upcoming appointment on 12/16/22 with PCP.  As appropriate, provider should bill the non-reimbursable HCPCS code [phone] for $0.01 with the applicable ICD-10 code attached  Below are codes that would exclude patient from SUPD measure: Rhabdomyolysis or myopathy G72.0            Drug-induced myopathy  G72.89          Other specified myopathies  G72.9            Myopathy, unspecified  M60.80         Other myositis, unspecified site  M60.9           Myositis, unspecified  M62.82         Rhabdomyolysis  T46.6X5A      Adverse effect of antihyperlipidemic and antiarteriosclerotic drugs, initial encounter    Chart forwarded to PCP.  Encounter note placed for 12/5 visit.   12/4:  Patient has been filling statin at the [place]. Despite adherence, will fail measure as it is calculated solely based on insurance claims. No further action at this time, PCP aware.   Future Appointments  Date Time Provider Department Center  11/24/2022  8:30 AM [facility] [place] [facility] None  12/08/2022  8:25 AM Charlsie Quest, NP [facility] None  12/16/2022  8:00 AM Glori Luis, MD [facility]  01/21/2023  3:15 PM Regan Lemming, MD [facility]  02/08/2023 11:15 AM [facility] ANNUAL WELLNESS VISIT [facility]   Loree Fee, PharmD Clinical Pharmacist [facility] [phone]

## 2022-11-24 ENCOUNTER — Ambulatory Visit: Payer: Medicare Other | Attending: Cardiology

## 2022-11-24 ENCOUNTER — Other Ambulatory Visit: Payer: Self-pay | Admitting: Cardiology

## 2022-11-24 DIAGNOSIS — I739 Peripheral vascular disease, unspecified: Secondary | ICD-10-CM

## 2022-11-24 LAB — VAS US ABI WITH/WO TBI
Left ABI: 0.82
Right ABI: 0.89

## 2022-11-24 NOTE — Progress Notes (Signed)
Results were explained to patient during his test with further testing ordered and continued follow-up.

## 2022-12-03 ENCOUNTER — Other Ambulatory Visit: Payer: Self-pay

## 2022-12-03 ENCOUNTER — Ambulatory Visit: Payer: Medicare Other | Attending: Cardiology

## 2022-12-03 DIAGNOSIS — I7121 Aneurysm of the ascending aorta, without rupture: Secondary | ICD-10-CM

## 2022-12-03 DIAGNOSIS — I739 Peripheral vascular disease, unspecified: Secondary | ICD-10-CM

## 2022-12-03 DIAGNOSIS — I700 Atherosclerosis of aorta: Secondary | ICD-10-CM

## 2022-12-03 DIAGNOSIS — I2510 Atherosclerotic heart disease of native coronary artery without angina pectoris: Secondary | ICD-10-CM

## 2022-12-03 NOTE — Progress Notes (Signed)
Abnormal ABI's. Refer to Dr Kirke Corin for a peripheral vascular consultation.

## 2022-12-08 ENCOUNTER — Encounter: Payer: Self-pay | Admitting: Cardiology

## 2022-12-08 ENCOUNTER — Ambulatory Visit: Payer: Medicare Other | Attending: Cardiology | Admitting: Cardiology

## 2022-12-08 VITALS — BP 140/56 | HR 60 | Ht 70.0 in | Wt 211.4 lb

## 2022-12-08 DIAGNOSIS — I5030 Unspecified diastolic (congestive) heart failure: Secondary | ICD-10-CM | POA: Diagnosis not present

## 2022-12-08 DIAGNOSIS — I2510 Atherosclerotic heart disease of native coronary artery without angina pectoris: Secondary | ICD-10-CM | POA: Diagnosis not present

## 2022-12-08 DIAGNOSIS — I739 Peripheral vascular disease, unspecified: Secondary | ICD-10-CM | POA: Diagnosis not present

## 2022-12-08 DIAGNOSIS — E785 Hyperlipidemia, unspecified: Secondary | ICD-10-CM

## 2022-12-08 DIAGNOSIS — I7121 Aneurysm of the ascending aorta, without rupture: Secondary | ICD-10-CM

## 2022-12-08 DIAGNOSIS — E1169 Type 2 diabetes mellitus with other specified complication: Secondary | ICD-10-CM

## 2022-12-08 DIAGNOSIS — I10 Essential (primary) hypertension: Secondary | ICD-10-CM | POA: Diagnosis not present

## 2022-12-08 DIAGNOSIS — I4819 Other persistent atrial fibrillation: Secondary | ICD-10-CM | POA: Diagnosis not present

## 2022-12-08 DIAGNOSIS — G4733 Obstructive sleep apnea (adult) (pediatric): Secondary | ICD-10-CM | POA: Diagnosis not present

## 2022-12-08 NOTE — Patient Instructions (Addendum)
Medication Instructions:  - No changes *If you need a refill on your cardiac medications before your next appointment, please call your pharmacy*  Lab Work: - None ordered  Testing/Procedures: - None ordered  Follow-Up: At [facility], you and your health needs are our priority.  As part of our continuing mission to provide you with exceptional heart care, we have created designated Provider Care Teams.  These Care Teams include your primary Cardiologist (physician) and Advanced Practice Providers (APPs -  Physician Assistants and Nurse Practitioners) who all work together to provide you with the care you need, when you need it.  Your next appointment:   3 month(s)  Provider:   Charlsie Quest, NP    Other Instructions - SCHEDULE REFERRAL FOR PERIPHERAL VASCULAR CONSULT W/ DR. Kirke Corin

## 2022-12-08 NOTE — Progress Notes (Signed)
Cardiology Office Note:  .   Date:  12/08/2022  ID:  Walter Salas, DOB [birth year], MRN [id number] PCP: Glori Luis, MD  [facility] Providers Cardiologist:  Yvonne Kendall, MD Electrophysiologist:  Regan Lemming, MD    History of Present Illness: .   Walter Salas is a 67 y.o. male with a past medical history of hypertension, CAD (PCI/DES to left circumflex and RCA at the [place] in 2014), TAA, OSA intolerant of CPAP, GERD, type 2 diabetes, hyperlipidemia, CVA in 2013 and 2023, HFpEF, mild aortic root dilatation, tobacco use, mechanical fall with ICH, and persistent atrial fibrillation who is here today for follow-up.   Previously underwent successful PCI/DES to the left circumflex and RCA in 2014 at the [place]. Establish care with Dr. Okey Dupre in 2018 after several syncopal episodes. Myocardial perfusion study was overall low risk with a small defect of mild severity and suggestive of infarct without ischemia. Echocardiogram revealed an EF of 60-65%, G1 DD, aortic root was mildly dilated 42 mm and ascending aorta was mildly dilated at 38 mm. A 30-day event monitor revealed predominantly sinus rhythm and first-degree AV block and no significant arrhythmias or pauses. Repeat echocardiogram 02/17/2022 revealed an EF of 55 to 60%, mild asymmetric LVH in the basal septal segment, G2 DD, borderline aortic root dilatation at 39 mm, mild dilatation of the ascending aorta at 40 mm. He was admitted 02/2021 with a lunar infarct of the left sounds, CT of the head and neck showed mild to moderate atherosclerosis without large vessel occlusion. Admitted to [facility] on 04/08/2022 and found to be in new onset atrial fibrillation, volume overloaded with a BNP of 241. Echocardiogram revealed an EF of 55%, moderate concentric LVH, G1 DD, trivial MR, and he was transferred to the [facility] for continued care. He underwent TEE/DCCV x 1 at 200 J with conversion to sinus bradycardia approximately 50 bpm. He was  started on apixaban 5 mg twice daily and was ultimately discharged in sinus rhythm on 04/10/2022.  Followed with EP and underwent DCCV on 06/01/2019 and was shocked 150 J converted to normal sinus rhythm without any apparent complications.  Echocardiogram was repeated 09/11/2022 which revealed LVEF 60 to 65%, no RWMA, G2 DD, small pericardial effusion with no evidence of tamponade.  He presented again to [facility] 10/02/22 for ablation procedure for persistent atrial fibrillation.  He maintained sinus rhythm upon presentation.  He underwent successful electrical isolation and anatomical encircling of all 4 pulmonary veins and pulsed field ablation, ablation of posterior wall with pulsed field ablation with no early apparent complications.   He was last seen in clinic 11/03/2022 with concerns about his current medication regimen associated some confusion on his amiodarone dosing.  He also states he has been out of his amlodipine for approximately.  He had previously been scheduled for [facility] for surgical clearance but was canceled because he stated his surgery been canceled to the beginning of the year.  He been continued on his current medication regimen and due to claudication symptoms while walking short distances he was scheduled for ABIs to evaluate his arterial circulation.  He returns to clinic today stating that he continues to have discomfort from his bilateral lower extremities.  He has been stressed and upset today as he recently lost his wife within the last few weeks.  He is working to get all of their assets and properties in order and is under an increased amount of stress.  He states that surgery for his neck will be at the beginning of the year.  He states that he has been compliant with his current medication regimen.  He denies any episodes of bleeding or noted blood in his urine or stool and has not missed any of his apixaban.  He also has an upcoming appointment with EP that  he states will have to be changed due to another appointment.  He denies any chest pain or shortness of breath or palpitations.  States that he has occasional swelling to his bilateral lower extremities.  Continues to have right hand numbness that he states would be improved after his surgery.  He also notes weight gain over the last week.  He has been taking his furosemide due to elevated weight gain and stated that his weight is starting to come back down.  Denies any hospitalizations or visits to the emergency department.  ROS: 10 point review of systems has been reviewed and considered negative with exception was been listed in the HPI  Studies Reviewed: Marland Kitchen   EKG Interpretation Date/Time:  Tuesday December 08 2022 08:19:19 EST Ventricular Rate:  60 PR Interval:  254 QRS Duration:  88 QT Interval:  474 QTC Calculation: 474 R Axis:   74  Text Interpretation: Sinus rhythm with 1st degree A-V block T wave abnormality, consider anterior ischemia When compared with ECG of 03-Nov-2022 14:42, Confirmed by Charlsie Quest ([id number]) on 12/08/2022 8:27:25 AM    Arterial Duplex 12/03/22 Summary:  Right: 30-49% stenosis noted in the deep femoral artery. 30-49% stenosis  noted in the superficial femoral artery. Total occlusion noted in the  peroneal artery.   Left: 30-49% stenosis noted in the deep femoral artery. 75-99% stenosis  noted in the superficial femoral artery. Total occlusion noted in the  peroneal artery.   Diffuse plaque seen in all scanned vessels.     See table(s) above for measurements and observations.    Suggest Peripheral Vascular Consult.   ABI's 11/24/22 Summary:  Right: Resting right ankle-brachial index indicates mild right lower  extremity arterial disease. The right toe-brachial index is abnormal.   Left: Resting left ankle-brachial index indicates mild left lower  extremity arterial disease. The left toe-brachial index is abnormal.  *See table(s) above for  measurements and observations.    Suggest follow up study in 12 months.   A-Fib Ablation 10/06/22 CONCLUSIONS: 1. Sinus rhythm upon presentation.   2. Successful electrical isolation and anatomical encircling of all four pulmonary veins with pulse field ablation. 3. Ablation of posterior wall with pulse field ablation 4. No early apparent complications.   Cardiac CT 09/29/2022 IMPRESSION: 1. There is normal pulmonary vein drainage into the left atrium.   2. The left atrial appendage is a chicken wing type with ostial size 32 x 25 mm and length 37 mm, Area 53 mm2. There is no thrombus in the left atrial appendage.   3. The esophagus runs in the left atrial midline and is not in the proximity to any of the pulmonary veins.   TTE 09/11/22 1. Left ventricular ejection fraction, by estimation, is 60 to 65%. The  left ventricle has normal function. The left ventricle has no regional  wall motion abnormalities. There is mild left ventricular hypertrophy.  Left ventricular diastolic parameters  are consistent with Grade II diastolic dysfunction (pseudonormalization).   2. Right ventricular systolic function is normal. The right ventricular  size is normal. Tricuspid regurgitation signal is inadequate for assessing  PA pressure.  3. Left atrial size was moderately dilated.   4. A small pericardial effusion is present estimated 1.29 cm off the LV  free wall. Effusion appears less off RV. There is no evidence of cardiac  tamponade.   5. The mitral valve is normal in structure. No evidence of mitral valve  regurgitation. No evidence of mitral stenosis.   6. The aortic valve appears tricuspid though not well visualized. Aortic  valve regurgitation is not visualized. Aortic valve sclerosis is present,  with no evidence of aortic valve stenosis. Aortic valve mean gradient  measures 5.0 mmHg.   7. There is mild dilatation of the aortic root, measuring 43 mm. There is  mild dilatation of the  ascending aorta, measuring 42 mm.   8. The inferior vena cava is normal in size with greater than 50%  respiratory variability, suggesting right atrial pressure of 3 mmHg.    TTE 02/17/22  Review of the above records today demonstrates:   1. Left ventricular ejection fraction, by estimation, is 55 to 60%. The  left ventricle has normal function. The left ventricle has no regional  wall motion abnormalities. There is mild asymmetric left ventricular  hypertrophy of the basal-septal segment.  Left ventricular diastolic parameters are consistent with Grade II  diastolic dysfunction (pseudonormalization).   2. Right ventricular systolic function is normal. The right ventricular  size is normal. Tricuspid regurgitation signal is inadequate for assessing  PA pressure.   3. Left atrial size was mildly dilated.   4. The mitral valve is normal in structure. No evidence of mitral valve  regurgitation. No evidence of mitral stenosis.   5. The aortic valve is normal in structure. Aortic valve regurgitation is  not visualized. No aortic stenosis is present.   6. There is borderline dilatation of the aortic root, measuring 39 mm.  There is mild dilatation of the ascending aorta, measuring 40 mm.   7. The inferior vena cava is normal in size with greater than 50%  respiratory variability, suggesting right atrial pressure of 3 mmHg  Risk Assessment/Calculations:    CHA2DS2-VASc Score = 7   This indicates a 11.2% annual risk of stroke. The patient's score is based upon: CHF History: 1 HTN History: 1 Diabetes History: 1 Stroke History: 2 Vascular Disease History: 1 Age Score: 1 Gender Score: 0    HYPERTENSION CONTROL Vitals:   12/08/22 0815 12/08/22 1042  BP: (!) 142/56 (!) 140/56    The patient's blood pressure is elevated above target today.  In order to address the patient's elevated BP: Blood pressure will be monitored at home to determine if medication changes need to be made.       Physical Exam:   VS:  BP (!) 140/56 (BP Location: Left Arm, Patient Position: Sitting, Cuff Size: Normal)   Pulse 60   Ht 5\' 10"  (1.778 m)   Wt 211 lb 6.4 oz (95.9 kg)   SpO2 94%   BMI 30.33 kg/m    Wt Readings from Last 3 Encounters:  12/08/22 211 lb 6.4 oz (95.9 kg)  11/03/22 204 lb 12.8 oz (92.9 kg)  10/26/22 205 lb (93 kg)    GEN: Well nourished, well developed in no acute distress NECK: No JVD; No carotid bruits CARDIAC: RRR, I/VI systolic murmur without rubs or gallops RESPIRATORY:  Clear to auscultation without rales, wheezing or rhonchi  ABDOMEN: Soft, non-tender, non-distended EXTREMITIES: Trace pretibial edema; No deformity   ASSESSMENT AND PLAN: .   Coronary artery disease with previous  PCI/DES to left circumflex and RCA at the [place] in 2014.  Denies any current anginal anginal equivalents.  EKG today reveals sinus rhythm with a rate of 60 with first-degree AV block and T wave abnormalities that are unchanged from prior studies.  He is continued on aspirin 81 mg daily, ezetimibe 10 mg daily, fenofibrate 45 mg daily, and Vascepa 2 g twice daily.  He was previously scheduled for [facility] and was canceled by the patient and his surgery was canceled at the [place] due to his recent A-fib ablation.  On discussion about that today he stated that he will have further testing completed at the [place] prior to his surgery at the beginning of the year.  HFpEF with last echocardiogram completed 08/2022 with an LVEF of 60 to 65%.  He remains euvolemic on exam today and denies any shortness of breath.  He has noticed some weight gain as he continues to weigh himself at home.  He has started taking his furosemide again and stated swelling is bilateral lower extremities as above and his weight is starting to return to normal.  He has been encouraged to avoid increase his sodium intake over the holidays that will exacerbate his swelling.  He is to continue to pursue 25 mg daily, chlorthalidone 50 mg  daily, and furosemide 20 mg as needed for weight gain.  Previously not on beta-blocker due to baseline sinus bradycardia.  Primary hypertension with blood pressure today 142/56.  Blood pressure slightly elevated today as patient states he has been under an increased amount of stress.  He is continued on amlodipine 2.5 mg daily chlorthalidone 50 mg daily, losartan 25 mg daily, and he is taking his furosemide.  He has been encouraged to continue to monitor his blood pressures 1 to 2 hours postmedication administration as well.  Mixed hyperlipidemia with associated type 2 diabetes where he is continued on atorvastatin 80 mg daily, ezetimibe 10 mg daily, fenofibrate 145 mg daily, and Vascepa 2 g twice daily.  This continues to be monitored by his PCP.  We have requested his most recent labs.  Persistent atrial fibrillation status post DCCV that was successful and status post ablation procedure.  EKG today reveals sinus rhythm with a first-degree AV block.  He is continued on amiodarone 200 mg once daily and apixaban 5 mg twice daily for CHA2DS2-VASc score of at least 7 for stroke prophylaxis.  Continue to follow-up with EP for continued management.  Obstructive sleep apnea has been intolerant of CPAP.  Continues to be followed by Dr. Mayford Knife.  Encouraged compliance with CPAP.  Intermittent claudication with abnormal arterial studies.  He has been referred to Dr Kirke Corin for vascular workup.  Arterial duplex revealed in the right 30-49% stenosis noted in the deep femoral artery, 30-49% stenosis noted in the superficial femoral artery, total occlusion noted in the peroneal artery.  Left revealed 30-49% stenosis noted in the deep femoral artery.  75-99% stenosis noted in the left superficial femoral artery.  Total occlusion noted in the peroneal artery.  Diffuse plaque seen in all the scanned vessels.  Thoracic ascending aortic aneurysm measuring 4.3 cm, findings stable on CTA of chest and aorta 12/2021 with  recommendation for follow-up imaging yearly.  Continue with optimal blood pressure and lipid control along with risk factor modification including smoking cessation.  Follow-up scan ordered for 12/2022.       Dispo: Patient to return to clinic to see MD/APP in 3 months or sooner if needed for reevaluation  of symptoms  Signed, Markey Deady, NP

## 2022-12-09 ENCOUNTER — Other Ambulatory Visit: Payer: Self-pay | Admitting: Family Medicine

## 2022-12-09 ENCOUNTER — Other Ambulatory Visit: Payer: Self-pay | Admitting: Internal Medicine

## 2022-12-09 DIAGNOSIS — R1111 Vomiting without nausea: Secondary | ICD-10-CM

## 2022-12-09 DIAGNOSIS — G8929 Other chronic pain: Secondary | ICD-10-CM

## 2022-12-09 NOTE — Telephone Encounter (Signed)
Requesting: Tramadol Contract: No UDS: No Last Refill: 10/23/2022  Zofran Refilled: 10/23/2022  Last Visit: 09/09/2022 Next Visit: 12/16/2022

## 2022-12-16 ENCOUNTER — Ambulatory Visit: Payer: Medicare Other | Admitting: Family Medicine

## 2022-12-16 ENCOUNTER — Ambulatory Visit: Payer: Medicare Other

## 2022-12-16 ENCOUNTER — Encounter: Payer: Self-pay | Admitting: Family Medicine

## 2022-12-16 VITALS — BP 136/76 | HR 65 | Temp 98.2°F | Ht 70.0 in | Wt 207.0 lb

## 2022-12-16 DIAGNOSIS — Z23 Encounter for immunization: Secondary | ICD-10-CM | POA: Diagnosis not present

## 2022-12-16 DIAGNOSIS — I10 Essential (primary) hypertension: Secondary | ICD-10-CM

## 2022-12-16 DIAGNOSIS — Z7984 Long term (current) use of oral hypoglycemic drugs: Secondary | ICD-10-CM

## 2022-12-16 DIAGNOSIS — E119 Type 2 diabetes mellitus without complications: Secondary | ICD-10-CM

## 2022-12-16 DIAGNOSIS — F4321 Adjustment disorder with depressed mood: Secondary | ICD-10-CM | POA: Diagnosis not present

## 2022-12-16 DIAGNOSIS — E1169 Type 2 diabetes mellitus with other specified complication: Secondary | ICD-10-CM | POA: Diagnosis not present

## 2022-12-16 DIAGNOSIS — E785 Hyperlipidemia, unspecified: Secondary | ICD-10-CM | POA: Diagnosis not present

## 2022-12-16 NOTE — Assessment & Plan Note (Signed)
Chronic issue.  Historically well-controlled.  I encouraged him to start checking at home.  Discussed a goal of less than 130/80.  If it is consistently above 130/80 he will let us know.Marland Kitchen  He will continue losartan 25 mg daily, chlorthalidone 50 mg daily, and amlodipine 2.5 mg daily.  Plan for follow-up in 3 months for this issue.

## 2022-12-16 NOTE — Assessment & Plan Note (Signed)
Chronic issue.  Check lipid panel.  He will continue Lipitor 80 mg daily and Vascepa 2 g twice daily.  He gets both of these from the [place].

## 2022-12-16 NOTE — Patient Instructions (Signed)
Please start checking your blood pressure at home.  If it is consistently greater than 130/80 please let us know.

## 2022-12-16 NOTE — Assessment & Plan Note (Signed)
Chronic issue.  Check A1c.  Continue metformin 1000 mg twice daily. 

## 2022-12-16 NOTE — Progress Notes (Signed)
[name], MD Phone: [phone]  Walter Salas is a 67 y.o. male who presents today for follow-up.  HYPERTENSION Disease Monitoring: Blood pressure range-not checking Chest pain- no      Dyspnea- no Medications: Compliance- taking amlodipine, losartan, chlorthalidone edema- no  DIABETES Disease Monitoring: Blood Sugar ranges-not checking Polyuria/phagia/dipsia- no      Optho- due Medications: Compliance- taking metformin Hypoglycemic symptoms- no  HYPERLIPIDEMIA Disease Monitoring: See symptoms for Hypertension Medications: Compliance- taking lipitor (throught the [place]), vascepa Right upper quadrant pain- no  Muscle aches- no  Patient reports he lost his wife in November.  He notes dealing with grief though it is nothing excessive.    Social History   Tobacco Use  Smoking Status Former   Current packs/day: 0.00   Average packs/day: 1 pack/day for 50.0 years (50.0 ttl pk-yrs)   Types: Cigarettes   Start date: 02/16/1968   Quit date: 02/15/2018   Years since quitting: 4.8  Smokeless Tobacco Never    Current Outpatient Medications on File Prior to Visit  Medication Sig Dispense Refill   amiodarone (PACERONE) 200 MG tablet Take 1 tablet (200 mg total) by mouth daily. 90 tablet 3   amLODipine (NORVASC) 2.5 MG tablet Take 1 tablet (2.5 mg total) by mouth daily. TAKE ONE TABLET (2.5 MG) BY MOUTH EVERY DAY 90 tablet 3   apixaban (ELIQUIS) 5 MG TABS tablet Take 5 mg by mouth 2 (two) times daily.     aspirin 81 MG EC tablet Take 81 mg by mouth daily.     atorvastatin (LIPITOR) 80 MG tablet Take 80 mg by mouth daily.     carbidopa-levodopa (SINEMET IR) 25-100 MG tablet Take 1 tablet by mouth 3 (three) times daily.     chlorthalidone (HYGROTON) 50 MG tablet Take 50 mg by mouth daily.     cholecalciferol (VITAMIN D3) 25 MCG (1000 UNIT) tablet Take 4,000 Units by mouth daily.     DULoxetine (CYMBALTA) 20 MG capsule Take 20 mg by mouth daily.     ezetimibe (ZETIA) 10 MG  tablet TAKE 1 TABLET BY MOUTH DAILY. 90 tablet 3   fenofibrate (TRICOR) 145 MG tablet Take 1 tablet (145 mg total) by mouth daily. 30 tablet 0   furosemide (LASIX) 20 MG tablet TAKE 1 TABLET BY MOUTH DAILY AS NEEDED (FOR WEIGHT GAIN >3 LBS OVER NIGHT OR >5 LBS/WEEK) 30 tablet 4   glucose blood test strip Check once daily, E11.9 100 each 12   losartan (COZAAR) 25 MG tablet Take 25 mg by mouth daily.     metFORMIN (GLUCOPHAGE) 1000 MG tablet Take 1,000 mg by mouth 2 (two) times daily with a meal.     methyl salicylate liquid Apply 1 application topically as needed for muscle pain.     nitroGLYCERIN (NITROSTAT) 0.4 MG SL tablet Place 1 tablet (0.4 mg total) under the tongue every 5 (five) minutes as needed for chest pain. May take up to 3 doses. 30 tablet 0   omeprazole (PRILOSEC) 20 MG capsule Take 20 mg by mouth daily.     ondansetron (ZOFRAN) 4 MG tablet TAKE 1 TABLET BY MOUTH EVERY 8 HOURS AS NEEDED. 20 tablet 0   polyvinyl alcohol (LIQUIFILM TEARS) 1.4 % ophthalmic solution Place 1 drop into both eyes as needed for dry eyes.      potassium chloride SA (K-DUR,KLOR-CON) 20 MEQ tablet Take 1 tablet (20 mEq total) by mouth daily. 90 tablet 3   pregabalin (LYRICA) 75 MG capsule Take 75  mg by mouth 3 (three) times daily.     tamsulosin (FLOMAX) 0.4 MG CAPS capsule TAKE 1 CAPSULE BY MOUTH ONCE DAILY 30 capsule 3   traMADol (ULTRAM) 50 MG tablet TAKE TWO TABLETS BY MOUTH EVERY 8 HOURS AS NEEDED FOR PAIN 90 tablet 0   VASCEPA 1 g capsule Take 2 g by mouth 2 (two) times daily.     vitamin B-12 (CYANOCOBALAMIN) 1000 MCG tablet Take 1 tablet (1,000 mcg total) by mouth daily. 30 tablet 0   No current facility-administered medications on file prior to visit.     ROS see history of present illness  Objective  Physical Exam Vitals:   12/16/22 1531 12/16/22 1542  BP: 138/76 136/76  Pulse: 65   Temp: 98.2 F (36.8 C)   SpO2: 97%     BP Readings from Last 3 Encounters:  12/16/22 136/76  12/08/22  (!) 140/56  11/03/22 (!) 128/58   Wt Readings from Last 3 Encounters:  12/16/22 207 lb (93.9 kg)  12/08/22 211 lb 6.4 oz (95.9 kg)  11/03/22 204 lb 12.8 oz (92.9 kg)    Physical Exam Constitutional:      General: He is not in acute distress.    Appearance: He is not diaphoretic.  Cardiovascular:     Rate and Rhythm: Normal rate and regular rhythm.     Heart sounds: Normal heart sounds.  Pulmonary:     Effort: Pulmonary effort is normal.     Breath sounds: Normal breath sounds.  Skin:    General: Skin is warm and dry.  Neurological:     Mental Status: He is alert.      Assessment/Plan: Please see individual problem list.  Type 2 diabetes mellitus without complication, without long-term current use of insulin (HCC) Assessment & Plan: Chronic issue.  Check A1c.  Continue metformin 1000 mg twice daily.  Orders: -     Hemoglobin A1c  Hyperlipidemia associated with type 2 diabetes mellitus (HCC) Assessment & Plan: Chronic issue.  Check lipid panel.  He will continue Lipitor 80 mg daily and Vascepa 2 g twice daily.  He gets both of these from the [place].  Orders: -     Lipid panel  Grief Assessment & Plan: Offered support.  Advised to let us know if anything worsens.  Patient will monitor.   Essential hypertension Assessment & Plan: Chronic issue.  Historically well-controlled.  I encouraged him to start checking at home.  Discussed a goal of less than 130/80.  If it is consistently above 130/80 he will let us know.Marland Kitchen  He will continue losartan 25 mg daily, chlorthalidone 50 mg daily, and amlodipine 2.5 mg daily.  Plan for follow-up in 3 months for this issue.   Encounter for administration of vaccine -     Flu Vaccine Trivalent High Dose (Fluad)     Health Maintenance: Discussed getting Pneumovax today.  We do not have this in stock.  Patient will be scheduled for nurse visit in 2 to 3 weeks to get this.  Encouraged patient to see the eye doctor when he is able.   Colonoscopy is scheduled.  Patient with CT chest imaging tomorrow.  Return in about 3 weeks (around 01/06/2023) for Nurse visit for Pneumovax 23, 3 months for transfer of care.   [name], MD [facility]

## 2022-12-16 NOTE — Assessment & Plan Note (Signed)
Offered support.  Advised to let us know if anything worsens.  Patient will monitor.

## 2022-12-17 ENCOUNTER — Ambulatory Visit
Admission: RE | Admit: 2022-12-17 | Discharge: 2022-12-17 | Disposition: A | Discharge status: Home / Self Care | Payer: Medicare Other | Source: Ambulatory Visit | Attending: Physician Assistant | Admitting: Physician Assistant

## 2022-12-17 DIAGNOSIS — I2510 Atherosclerotic heart disease of native coronary artery without angina pectoris: Secondary | ICD-10-CM | POA: Diagnosis not present

## 2022-12-17 DIAGNOSIS — I7121 Aneurysm of the ascending aorta, without rupture: Secondary | ICD-10-CM | POA: Insufficient documentation

## 2022-12-17 DIAGNOSIS — I3139 Other pericardial effusion (noninflammatory): Secondary | ICD-10-CM | POA: Diagnosis not present

## 2022-12-17 DIAGNOSIS — I700 Atherosclerosis of aorta: Secondary | ICD-10-CM | POA: Diagnosis not present

## 2022-12-17 LAB — POCT I-STAT, CHEM 8
BUN: 22 mg/dL (ref 8–23)
Calcium, Ion: 1.19 mmol/L (ref 1.15–1.40)
Chloride: 96 mmol/L — ABNORMAL LOW (ref 98–111)
Creatinine, Ser: 1.3 mg/dL — ABNORMAL HIGH (ref 0.61–1.24)
Glucose, Bld: 239 mg/dL — ABNORMAL HIGH (ref 70–99)
HCT: 42 % (ref 39.0–52.0)
Hemoglobin: 14.3 g/dL (ref 13.0–17.0)
Potassium: 4.2 mmol/L (ref 3.5–5.1)
Sodium: 138 mmol/L (ref 135–145)
TCO2: 31 mmol/L (ref 22–32)

## 2022-12-17 LAB — LIPID PANEL
Cholesterol: 117 mg/dL (ref 0–200)
HDL: 36.2 mg/dL — ABNORMAL LOW (ref >39.00)
LDL Cholesterol: 47 mg/dL (ref 0–99)
NonHDL: 81
Total CHOL/HDL Ratio: 3
Triglycerides: 170 mg/dL — ABNORMAL HIGH (ref 0.0–149.0)
VLDL: 34 mg/dL (ref 0.0–40.0)

## 2022-12-17 LAB — HEMOGLOBIN A1C: Hgb A1c MFr Bld: 6.8 % — ABNORMAL HIGH (ref 4.6–6.5)

## 2022-12-17 MED ORDER — IOHEXOL 350 MG/ML SOLN
100.0000 mL | Freq: Once | INTRAVENOUS | Status: AC | PRN
  Administered 2022-12-17: 100 mL via INTRAVENOUS

## 2022-12-22 ENCOUNTER — Encounter: Payer: Self-pay | Admitting: Gastroenterology

## 2022-12-31 ENCOUNTER — Other Ambulatory Visit: Payer: Self-pay | Admitting: Family Medicine

## 2022-12-31 DIAGNOSIS — R1111 Vomiting without nausea: Secondary | ICD-10-CM

## 2022-12-31 DIAGNOSIS — M545 Low back pain, unspecified: Secondary | ICD-10-CM

## 2023-01-01 ENCOUNTER — Other Ambulatory Visit: Payer: Self-pay | Admitting: Cardiovascular Disease

## 2023-01-01 DIAGNOSIS — I739 Peripheral vascular disease, unspecified: Secondary | ICD-10-CM

## 2023-01-04 ENCOUNTER — Ambulatory Visit (INDEPENDENT_AMBULATORY_CARE_PROVIDER_SITE_OTHER): Payer: Medicare Other

## 2023-01-04 DIAGNOSIS — Z23 Encounter for immunization: Secondary | ICD-10-CM

## 2023-01-04 NOTE — Progress Notes (Signed)
Pt presented for pneumovax 23 vaccine. Pt was identified through 2 identifies. Pt tolerated injection well in the right deltoid.

## 2023-01-14 DIAGNOSIS — K31A0 Gastric intestinal metaplasia, unspecified: Secondary | ICD-10-CM | POA: Diagnosis not present

## 2023-01-14 DIAGNOSIS — R1013 Epigastric pain: Secondary | ICD-10-CM | POA: Diagnosis not present

## 2023-01-15 ENCOUNTER — Telehealth: Payer: Self-pay

## 2023-01-15 ENCOUNTER — Other Ambulatory Visit: Payer: Self-pay | Admitting: Family

## 2023-01-15 DIAGNOSIS — E785 Hyperlipidemia, unspecified: Secondary | ICD-10-CM

## 2023-01-15 NOTE — Telephone Encounter (Signed)
   Primary Cardiologist: Walter Hanson, MD  Chart reviewed as part of pre-operative protocol coverage. Given past medical history and time since last visit, based on ACC/AHA guidelines, Walter Salas would be at acceptable risk for the planned procedure without further cardiovascular testing.   Patient was advised that if he develops new symptoms prior to surgery to contact our office to arrange a follow-up appointment.  He verbalized understanding.  Per office protocol, patient can hold Eliquis for 1-2 days prior to procedure.    I will route this recommendation to the requesting party via Epic fax function and remove from pre-op pool.  Please call with questions.  Rosaline EMERSON Bane, NP-C  01/15/2023, 3:43 PM [address], [address] Office [phone] Fax [phone]

## 2023-01-15 NOTE — Telephone Encounter (Signed)
 Will update surgeon office to see notes from preop APP

## 2023-01-15 NOTE — Telephone Encounter (Signed)
 Called GI office in regard to 2nd request today. Assured GI office we have the clearance request in process. One patient is cleared we will fax clearance notes.

## 2023-01-15 NOTE — Telephone Encounter (Signed)
 Please contact patient to determine if stress test was completed at the [place]. We will be unable to provide medical clearance until a stress test is reviewed.

## 2023-01-15 NOTE — Telephone Encounter (Signed)
 Hi Walter Salas,  Patient's chart was reviewed for preoperative cardiac evaluation.  He was seen by you on 12/08/2022 and according to protocol, we request that you comment on cardiac risk for upcoming procedure since office visit was less than 2 months ago.    Please route your response to p cv div preop.  Thank you, Rosaline EMERSON Bane, NP-C 01/15/2023, 1:18 PM

## 2023-01-15 NOTE — Telephone Encounter (Signed)
   Pre-operative Risk Assessment    Patient Name: Walter Salas  DOB: 29-Apr-1955 MRN: [id number]   Date of last office visit: 12/08/22 Tylene Lunch NP Date of next office visit: 01/22/23 Charlies Arthur PA   Request for Surgical Clearance    Procedure:   Colonoscopy  Date of Surgery:  Clearance 01/21/23                                Surgeon:  Dr. Elspeth CHRISTELLA Jungling Surgeon's Group or Practice Name:  [facility] Phone number:  [phone] Fax number:  [phone]   Type of Clearance Requested:   - Medical  - Pharmacy:  Hold Apixaban (Eliquis)     Type of Anesthesia:  Not Indicated   Additional requests/questions:    Bonney Huxley Merina Behrendt   01/15/2023, 9:55 AM

## 2023-01-15 NOTE — Telephone Encounter (Signed)
 Patient with diagnosis of afib on Eliquis for anticoagulation.    Procedure: colonoscopy Date of procedure: 01/21/23   CHA2DS2-VASc Score = 7   This indicates a 11.2% annual risk of stroke. The patient's score is based upon: CHF History: 1 HTN History: 1 Diabetes History: 1 Stroke History: 2 Vascular Disease History: 1 Age Score: 1 Gender Score: 0      CrCl 88 ml/min Platelet count 197  Per office protocol, patient can hold Eliquis for 1-2 days prior to procedure.    **This guidance is not considered finalized until pre-operative APP has relayed final recommendations.**

## 2023-01-15 NOTE — Telephone Encounter (Signed)
 I called the pt per preop APP today about stress test to be done at the [facility]. Pt tells me he knows nothing about a stress test to be done at the [facility], but then he said they are waiting for cardiology approval. I think he may have confused about the procedure for 01/21/23 and the stress test. As I confirmed with the pt that he did not have a stress test done with the [facility] and he answered no he did not.

## 2023-01-18 NOTE — Telephone Encounter (Signed)
 Requesting office is requesting this be re-faxed manually to:  [phone]

## 2023-01-18 NOTE — Telephone Encounter (Signed)
 Clearance printed and re-faxed to number provided

## 2023-01-19 DIAGNOSIS — E1142 Type 2 diabetes mellitus with diabetic polyneuropathy: Secondary | ICD-10-CM | POA: Insufficient documentation

## 2023-01-19 DIAGNOSIS — Z720 Tobacco use: Secondary | ICD-10-CM | POA: Insufficient documentation

## 2023-01-19 NOTE — Progress Notes (Signed)
 Cardiology Office Note:  .   Date:  01/19/2023  ID:  Walter Salas, DOB 1955/06/25, MRN [id number] PCP: Walter Camellia MATSU, MD  [facility] Providers Cardiologist:  Walter Hanson, MD Electrophysiologist:  Walter Gladis Norton, MD {  History of Present Illness: .   Walter Salas is a 68 y.o. male w/PMHx of CAD (PCI to LCx and RCA 2014), stroke, traumatic intracranial hemorrhage, DM, HTN, HLD, ascending aneurysm, OSA (intolerant of CPAP), AFib  Referred to Dr. Norton for new Afib, saw him 06/01/22, symptomatic with his AFib, though reported feeling weak, fatigued SOB for the last month or longer, was back in AFib at his visit. Pt preferred to avoid AADs and planned for ablation  DCCV 06/05/22 > successful AFib ablation 10/12/22  Saw cards APP 10/12/22, for pre-op risk assessment pending C spine surgery, planned for stress test, reported pt would need to be off his OAC for days ahead and at least 2 weeks afterwards and this part of his surgical clearance/comment deferred to EP  AFib clinic 10/26/22, in SR, no procedural concerns,   Cards APP 11/03/22, pt reported Cspine surgery deferred for the time being, a number of complaints, fatigue, claudication to his bilateral lower extremities  Confused on some meds, had not done the stress test Scheduled for ABIs  Cards APP 12/08/22, + PVD >> referred to Dr. Darron, no CP, SOB.  Pending colonoscopy >> cleared by pre-op pool and ok to hold 2 days [facility]  Today's visit is scheduled as his 3 mo post ablation visit  ROS:   He does not think he has had any AFib since his ablation No groin/healing concerns/complications, no CP, swallowing difficulty/pain He is doing well, as long as he is not in AFib he reports feeling well. No CP, palpitations No rest SOB Some DOE, though much better since maintaining SR No bleeding or signs of bleeding  Had his EGD/colonoscopy yesterday was instructed to resume eliquis tomorrow He has f/u with  neurosurgery he thinks soon, to restart process toward C-spine surgery, knows he Walter need a stress test prior Pending Dr. Darron    Arrhythmia/AAD hx AFib found March 2024 >> started on amiodarone  AFib ablation (PFA) 10/06/22  Studies Reviewed: SABRA    EKG done today and reviewed by myself:  SR 61bpm, 1st degree AVblock , unchanged  12/19/22: CT chest IMPRESSION: 1. Stable 4.3 cm ascending thoracic aortic aneurysm. Recommend annual imaging followup by CTA or MRA. This recommendation follows 2010 ACCF/AHA/AATS/ACR/ASA/SCA/SCAI/SIR/STS/SVM Guidelines for the Diagnosis and Management of Patients with Thoracic Aortic Disease. Circulation. 2010; 121: Z733-z630. Aortic aneurysm NOS (ICD10-I71.9) 2. Aortic Atherosclerosis (ICD10-I70.0). Coronary artery atherosclerosis. 3. Mild cardiomegaly, with continued small pericardial effusion.  12/03/22: LE arterial US  Summary:  Right: 30-49% stenosis noted in the deep femoral artery. 30-49% stenosis  noted in the superficial femoral artery. Total occlusion noted in the  peroneal artery.   Left: 30-49% stenosis noted in the deep femoral artery. 75-99% stenosis  noted in the superficial femoral artery. Total occlusion noted in the  peroneal artery.   Diffuse plaque seen in all scanned vessels.   See table(s) above for measurements and observations.  Suggest Peripheral Vascular Consult.    10/06/22: EPS/ablation CONCLUSIONS: 1. Sinus rhythm upon presentation.   2. Successful electrical isolation and anatomical encircling of all four pulmonary veins with pulse field ablation. 3. Ablation of posterior wall with pulse field ablation 4. No early apparent complications   Echo 09/11/22  1. Left ventricular ejection fraction, by  estimation, is 60 to 65%. The  left ventricle has normal function. The left ventricle has no regional  wall motion abnormalities. There is mild left ventricular hypertrophy.  Left ventricular diastolic parameters  are  consistent with Grade II diastolic dysfunction (pseudonormalization).   2. Right ventricular systolic function is normal. The right ventricular  size is normal. Tricuspid regurgitation signal is inadequate for assessing  PA pressure.   3. Left atrial size was moderately dilated.   4. A small pericardial effusion is present estimated 1.29 cm off the LV  free wall. Effusion appears less off RV. There is no evidence of cardiac  tamponade.   5. The mitral valve is normal in structure. No evidence of mitral valve  regurgitation. No evidence of mitral stenosis.   6. The aortic valve appears tricuspid though not well visualized. Aortic  valve regurgitation is not visualized. Aortic valve sclerosis is present,  with no evidence of aortic valve stenosis. Aortic valve mean gradient  measures 5.0 mmHg.   7. There is mild dilatation of the aortic root, measuring 43 mm. There is  mild dilatation of the ascending aorta, measuring 42 mm.   8. The inferior vena cava is normal in size with greater than 50%  respiratory variability, suggesting right atrial pressure of 3 mmHg.    TTE 02/17/22   1. Left ventricular ejection fraction, by estimation, is 55 to 60%. The  left ventricle has normal function. The left ventricle has no regional  wall motion abnormalities. There is mild asymmetric left ventricular  hypertrophy of the basal-septal segment.  Left ventricular diastolic parameters are consistent with Grade II  diastolic dysfunction (pseudonormalization).   2. Right ventricular systolic function is normal. The right ventricular  size is normal. Tricuspid regurgitation signal is inadequate for assessing  PA pressure.   3. Left atrial size was mildly dilated.   4. The mitral valve is normal in structure. No evidence of mitral valve  regurgitation. No evidence of mitral stenosis.   5. The aortic valve is normal in structure. Aortic valve regurgitation is  not visualized. No aortic stenosis is present.    6. There is borderline dilatation of the aortic root, measuring 39 mm.  There is mild dilatation of the ascending aorta, measuring 40 mm.   7. The inferior vena cava is normal in size with greater than 50%  respiratory variability, suggesting right atrial pressure of 3 mmHg    Risk Assessment/Calculations:    Physical Exam:   VS:  There were no vitals taken for this visit.   Wt Readings from Last 3 Encounters:  12/16/22 207 lb (93.9 kg)  12/08/22 211 lb 6.4 oz (95.9 kg)  11/03/22 204 lb 12.8 oz (92.9 kg)    GEN: Well nourished, well developed in no acute distress NECK: No JVD; No carotid bruits CARDIAC: RRR, no murmurs, rubs, gallops RESPIRATORY:  CTA b/l without rales, wheezing or rhonchi  ABDOMEN: Soft, non-tender, non-distended EXTREMITIES:  No edema; No deformity    ASSESSMENT AND PLAN: .    persistent AFib CHA2DS2Vasc is 6, on Eliquis, appropriately dosed No symptoms of his Afib post ablation  I think we should keep him on his amio until after his C-spine sx Labs are UTD  CAD PVD No rest leg pain, denies significant claudication No CP C/w Drs End/Arida/team  HTN Looks good  Secondary hypercoagulable state 2/2 AFib    Dispo: [place] is much easier for him, Walter have him see Suzann in 3-53mo, perhaps stop amio.  Back sooner with EP if needed    Signed, Charlies Macario Arthur, PA-C

## 2023-01-20 ENCOUNTER — Encounter: Payer: Self-pay | Admitting: Gastroenterology

## 2023-01-21 ENCOUNTER — Ambulatory Visit: Payer: Medicare Other | Admitting: Anesthesiology

## 2023-01-21 ENCOUNTER — Encounter
Admission: RE | Disposition: A | Discharge status: Home / Self Care | Payer: Self-pay | Source: Home / Self Care | Attending: Gastroenterology

## 2023-01-21 ENCOUNTER — Other Ambulatory Visit: Payer: Self-pay

## 2023-01-21 ENCOUNTER — Ambulatory Visit: Payer: Medicare Other | Admitting: Physician Assistant

## 2023-01-21 ENCOUNTER — Encounter: Payer: Self-pay | Admitting: Gastroenterology

## 2023-01-21 ENCOUNTER — Ambulatory Visit
Admission: RE | Admit: 2023-01-21 | Discharge: 2023-01-21 | Disposition: A | Discharge status: Home / Self Care | Payer: Medicare Other | Attending: Gastroenterology | Admitting: Gastroenterology

## 2023-01-21 DIAGNOSIS — Z87891 Personal history of nicotine dependence: Secondary | ICD-10-CM | POA: Diagnosis not present

## 2023-01-21 DIAGNOSIS — I5032 Chronic diastolic (congestive) heart failure: Secondary | ICD-10-CM | POA: Diagnosis not present

## 2023-01-21 DIAGNOSIS — K219 Gastro-esophageal reflux disease without esophagitis: Secondary | ICD-10-CM | POA: Insufficient documentation

## 2023-01-21 DIAGNOSIS — K3189 Other diseases of stomach and duodenum: Secondary | ICD-10-CM | POA: Diagnosis not present

## 2023-01-21 DIAGNOSIS — I110 Hypertensive heart disease with heart failure: Secondary | ICD-10-CM | POA: Diagnosis not present

## 2023-01-21 DIAGNOSIS — Z860101 Personal history of adenomatous and serrated colon polyps: Secondary | ICD-10-CM | POA: Diagnosis not present

## 2023-01-21 DIAGNOSIS — K31A11 Gastric intestinal metaplasia without dysplasia, involving the antrum: Secondary | ICD-10-CM | POA: Insufficient documentation

## 2023-01-21 DIAGNOSIS — I252 Old myocardial infarction: Secondary | ICD-10-CM | POA: Diagnosis not present

## 2023-01-21 DIAGNOSIS — E119 Type 2 diabetes mellitus without complications: Secondary | ICD-10-CM | POA: Insufficient documentation

## 2023-01-21 DIAGNOSIS — Z1211 Encounter for screening for malignant neoplasm of colon: Secondary | ICD-10-CM | POA: Diagnosis not present

## 2023-01-21 DIAGNOSIS — Z7985 Long-term (current) use of injectable non-insulin antidiabetic drugs: Secondary | ICD-10-CM | POA: Diagnosis not present

## 2023-01-21 DIAGNOSIS — Z7984 Long term (current) use of oral hypoglycemic drugs: Secondary | ICD-10-CM | POA: Diagnosis not present

## 2023-01-21 DIAGNOSIS — K2289 Other specified disease of esophagus: Secondary | ICD-10-CM | POA: Diagnosis not present

## 2023-01-21 DIAGNOSIS — K920 Hematemesis: Secondary | ICD-10-CM | POA: Diagnosis not present

## 2023-01-21 DIAGNOSIS — K2100 Gastro-esophageal reflux disease with esophagitis, without bleeding: Secondary | ICD-10-CM | POA: Diagnosis not present

## 2023-01-21 DIAGNOSIS — K644 Residual hemorrhoidal skin tags: Secondary | ICD-10-CM | POA: Diagnosis not present

## 2023-01-21 DIAGNOSIS — K640 First degree hemorrhoids: Secondary | ICD-10-CM | POA: Diagnosis not present

## 2023-01-21 DIAGNOSIS — Z8673 Personal history of transient ischemic attack (TIA), and cerebral infarction without residual deficits: Secondary | ICD-10-CM | POA: Insufficient documentation

## 2023-01-21 DIAGNOSIS — G4730 Sleep apnea, unspecified: Secondary | ICD-10-CM | POA: Diagnosis not present

## 2023-01-21 DIAGNOSIS — K2970 Gastritis, unspecified, without bleeding: Secondary | ICD-10-CM | POA: Diagnosis not present

## 2023-01-21 DIAGNOSIS — K2950 Unspecified chronic gastritis without bleeding: Secondary | ICD-10-CM | POA: Insufficient documentation

## 2023-01-21 DIAGNOSIS — R1013 Epigastric pain: Secondary | ICD-10-CM | POA: Diagnosis not present

## 2023-01-21 DIAGNOSIS — I2510 Atherosclerotic heart disease of native coronary artery without angina pectoris: Secondary | ICD-10-CM | POA: Insufficient documentation

## 2023-01-21 DIAGNOSIS — K649 Unspecified hemorrhoids: Secondary | ICD-10-CM | POA: Diagnosis not present

## 2023-01-21 DIAGNOSIS — I4891 Unspecified atrial fibrillation: Secondary | ICD-10-CM | POA: Diagnosis not present

## 2023-01-21 DIAGNOSIS — I10 Essential (primary) hypertension: Secondary | ICD-10-CM | POA: Diagnosis not present

## 2023-01-21 DIAGNOSIS — D122 Benign neoplasm of ascending colon: Secondary | ICD-10-CM | POA: Diagnosis not present

## 2023-01-21 DIAGNOSIS — K635 Polyp of colon: Secondary | ICD-10-CM | POA: Diagnosis not present

## 2023-01-21 DIAGNOSIS — D123 Benign neoplasm of transverse colon: Secondary | ICD-10-CM | POA: Diagnosis not present

## 2023-01-21 DIAGNOSIS — K449 Diaphragmatic hernia without obstruction or gangrene: Secondary | ICD-10-CM | POA: Insufficient documentation

## 2023-01-21 DIAGNOSIS — K5730 Diverticulosis of large intestine without perforation or abscess without bleeding: Secondary | ICD-10-CM | POA: Insufficient documentation

## 2023-01-21 HISTORY — DX: Personal history of transient ischemic attack (TIA), and cerebral infarction without residual deficits: Z86.73

## 2023-01-21 HISTORY — DX: Benign neoplasm of unspecified adrenal gland: D35.00

## 2023-01-21 HISTORY — DX: Cervical disc disorder with radiculopathy, unspecified cervical region: M50.10

## 2023-01-21 HISTORY — DX: Radiculopathy, cervical region: M54.12

## 2023-01-21 HISTORY — DX: Hereditary and idiopathic neuropathy, unspecified: G60.9

## 2023-01-21 HISTORY — PX: BIOPSY: SHX5522

## 2023-01-21 HISTORY — DX: Causalgia of unspecified lower limb: G57.70

## 2023-01-21 HISTORY — DX: Other specified bacterial intestinal infections: A04.8

## 2023-01-21 HISTORY — PX: POLYPECTOMY: SHX5525

## 2023-01-21 HISTORY — DX: Nontoxic single thyroid nodule: E04.1

## 2023-01-21 HISTORY — DX: Atherosclerosis of aorta: I70.0

## 2023-01-21 HISTORY — DX: Cerebral infarction, unspecified: I63.9

## 2023-01-21 HISTORY — DX: Other intervertebral disc degeneration, lumbosacral region without mention of lumbar back pain or lower extremity pain: M51.379

## 2023-01-21 HISTORY — DX: Carpal tunnel syndrome, unspecified upper limb: G56.00

## 2023-01-21 HISTORY — DX: Bacteremia: R78.81

## 2023-01-21 HISTORY — DX: Dorsalgia, unspecified: M54.9

## 2023-01-21 HISTORY — DX: Tremor, unspecified: R25.1

## 2023-01-21 HISTORY — PX: COLONOSCOPY: SHX5424

## 2023-01-21 HISTORY — DX: Deficiency of other specified B group vitamins: E53.8

## 2023-01-21 HISTORY — DX: Separation of muscle (nontraumatic), other site: M62.08

## 2023-01-21 HISTORY — DX: Thoracic aortic aneurysm, without rupture, unspecified: I71.20

## 2023-01-21 HISTORY — DX: Unspecified atrial fibrillation: I48.91

## 2023-01-21 HISTORY — DX: Vitamin D deficiency, unspecified: E55.9

## 2023-01-21 HISTORY — DX: Tinea unguium: B35.1

## 2023-01-21 HISTORY — PX: ESOPHAGOGASTRODUODENOSCOPY (EGD) WITH PROPOFOL: SHX5813

## 2023-01-21 HISTORY — DX: Primary osteoarthritis, right shoulder: M19.011

## 2023-01-21 LAB — GLUCOSE, CAPILLARY: Glucose-Capillary: 153 mg/dL — ABNORMAL HIGH (ref 70–99)

## 2023-01-21 SURGERY — COLONOSCOPY
Anesthesia: General

## 2023-01-21 MED ORDER — PROPOFOL 10 MG/ML IV BOLUS
INTRAVENOUS | Status: AC
  Filled 2023-01-21: qty 20

## 2023-01-21 MED ORDER — SODIUM CHLORIDE 0.9 % IV SOLN: INTRAVENOUS | Status: DC

## 2023-01-21 MED ORDER — PROPOFOL 500 MG/50ML IV EMUL
INTRAVENOUS | Status: DC | PRN
  Administered 2023-01-21: 155 ug/kg/min via INTRAVENOUS

## 2023-01-21 MED ORDER — GLYCOPYRROLATE 0.2 MG/ML IJ SOLN
INTRAMUSCULAR | Status: DC | PRN
  Administered 2023-01-21: .2 mg via INTRAVENOUS

## 2023-01-21 MED ORDER — LIDOCAINE HCL (CARDIAC) PF 100 MG/5ML IV SOSY
PREFILLED_SYRINGE | INTRAVENOUS | Status: DC | PRN
  Administered 2023-01-21: 100 mg via INTRAVENOUS

## 2023-01-21 MED ORDER — PROPOFOL 10 MG/ML IV BOLUS
INTRAVENOUS | Status: DC | PRN
  Administered 2023-01-21: 20 mg via INTRAVENOUS
  Administered 2023-01-21: 70 mg via INTRAVENOUS
  Administered 2023-01-21: 10 mg via INTRAVENOUS

## 2023-01-21 NOTE — Transfer of Care (Signed)
 Immediate Anesthesia Transfer of Care Note  Patient: Walter Salas  Procedure(s) Performed: COLONOSCOPY ESOPHAGOGASTRODUODENOSCOPY (EGD) WITH PROPOFOL  BIOPSY POLYPECTOMY  Patient Location: Endoscopy Unit  Anesthesia Type:General  Level of Consciousness: drowsy and patient cooperative  Airway & Oxygen Therapy: Patient Spontanous Breathing and Patient connected to face mask oxygen  Post-op Assessment: Report given to RN and Post -op Vital signs reviewed and stable  Post vital signs: Reviewed and stable  Last Vitals:  Vitals Value Taken Time  BP 127/69 01/21/23 0819  Temp    Pulse 57 01/21/23 0819  Resp 10 01/21/23 0819  SpO2 100 % 01/21/23 0819    Last Pain:  Vitals:   01/21/23 0714  TempSrc: Temporal         Complications: No notable events documented.

## 2023-01-21 NOTE — Anesthesia Preprocedure Evaluation (Signed)
 Anesthesia Evaluation  Patient identified by MRN, date of birth, ID band Patient awake    Reviewed: Allergy & Precautions, NPO status , Patient's Chart, lab work & pertinent test results  History of Anesthesia Complications Negative for: history of anesthetic complications  Airway Mallampati: III  TM Distance: <3 FB Neck ROM: full    Dental  (+) Chipped   Pulmonary neg shortness of breath, sleep apnea , former smoker   Pulmonary exam normal        Cardiovascular Exercise Tolerance: Good hypertension, (-) angina + CAD and + Past MI  + dysrhythmias Atrial Fibrillation      Neuro/Psych  Neuromuscular disease CVA  negative psych ROS   GI/Hepatic Neg liver ROS,GERD  ,,  Endo/Other  diabetes, Type 2    Renal/GU negative Renal ROS  negative genitourinary   Musculoskeletal   Abdominal   Peds  Hematology negative hematology ROS (+)   Anesthesia Other Findings Past Medical History: No date: (HFpEF) heart failure with preserved ejection fraction (HCC)     Comment:  a. 2018 Echo: EF 60-65%, normal wall motion, Gr1DD, mild              aortic valve calcification; b. 12/2017 Echo: EF 60-65%,               no rwma, Ao root 4.3cm, Asc Ao 4.4 cm, mildly dil LA. No date: Agent orange exposure No date: Anginal pain (HCC) No date: Aortic atherosclerosis (HCC) No date: Arthritis No date: Atrial fibrillation (HCC) No date: B12 deficiency No date: Back pain No date: Bacteremia No date: Benign neoplasm of adrenal gland No date: CAD (coronary artery disease)     Comment:  a. s/p PCI/DES to the LCx and RCA in 2014; b. 05/2016 MV:              EF 55-65%. Small, mild defect in basal anterolateral               location->infarct w/o ischemia. No date: [facility])     Comment:  skin No date: Carpal tunnel syndrome No date: Causalgia of lower limb No date: Cervical disc disorder with radiculopathy No date: Cervical radiculopathy No  date: Chronic cough No date: Chronic pain No date: Colon polyp No date: Degeneration of lumbar or lumbosacral intervertebral disc No date: Degenerative joint disease of right acromioclavicular joint No date: Depression No date: Diabetes mellitus without complication (HCC) No date: Diastasis recti No date: Diverticulosis No date: GERD (gastroesophageal reflux disease) No date: H. pylori infection No date: Hereditary and idiopathic peripheral neuropathy No date: History of CVA (cerebrovascular accident) No date: History of kidney stones No date: History of stomach ulcers 12/14/2012: History of subdural hematoma No date: HOH (hard of hearing) 1999: Hx of laminectomy No date: Hyperlipidemia No date: Hypertension No date: Lacunar infarct, acute (HCC) No date: Myocardial infarction (HCC) No date: Onychomycosis No date: Sleep apnea No date: Stroke (HCC) 12/14/2012: Subdural hematoma (HCC) No date: Syncope     Comment:  a. Event monitor 2018: NSR with first-degree AV block,               no significant arrhythmias or pauses; b.  Myoview  showed               a small in size, mild in severity fixed basal and               anterior lateral defect, no ischemia, LVEF 55 to 65%, low  risk study No date: Thoracic aortic aneurysm [facility])     Comment:  a. TTE 2018: Mildly dil Ao root- 4.2 cm; b. CTA aorta               6/18: Asc Ao aneurysmal dil w max diam of 4.3 cm; b.               12/2019 CTA Chest: stable 4.3cm thor Ao Aneurysm. No date: Thoracic aortic aneurysm without rupture (HCC) No date: Thyroid  nodule No date: Tremor No date: Ulcer No date: Vitamin D  deficiency  Past Surgical History: 10/06/2022: ATRIAL FIBRILLATION ABLATION; N/A     Comment:  Procedure: ATRIAL FIBRILLATION ABLATION;  Surgeon:               Inocencio Soyla Lunger, MD;  Location: [facility] INV[facility] CV LAB;               Service: Cardiovascular;  Laterality: N/A; 1993, 1994, 1997, 1998: BACK SURGERY No date:  BLEPHAROPLASTY 06/05/2022: CARDIOVERSION; N/A     Comment:  Procedure: CARDIOVERSION;  Surgeon: Lonni Slain, MD;  Location: [facility] INVASIVE CV LAB;  Service:               Cardiovascular;  Laterality: N/A; No date: CAROTID STENT 1990: CARPAL TUNNEL RELEASE; Bilateral 03/08/2018: CATARACT EXTRACTION W/PHACO; Left     Comment:  Procedure: CATARACT EXTRACTION PHACO AND INTRAOCULAR               LENS PLACEMENT (IOC) LEFT;  Surgeon: Jaye Fallow,               MD;  Location: [facility] ORS;  Service: Ophthalmology;                Laterality: Left;  US   00:24.2 CDE 3.25 Fluid pack lot               # [id number] 05/24/2018: CATARACT EXTRACTION W/PHACO; Right     Comment:  Procedure: CATARACT EXTRACTION PHACO AND INTRAOCULAR               LENS PLACEMENT (IOC) RIGHT;  Surgeon: Jaye Fallow,               MD;  Location: [facility];  Service:               Ophthalmology;  Laterality: Right;  Diabetic - oral meds 2000. 2003: CERVICAL FUSION 2000: CHOLECYSTECTOMY 02/23/2017: COLONOSCOPY WITH PROPOFOL ; N/A     Comment:  Procedure: COLONOSCOPY WITH PROPOFOL ;  Surgeon:               Gaylyn Lunger PENNER, MD;  Location: [facility] ENDOSCOPY;                Service: Endoscopy;  Laterality: N/A; 2014: CORONARY ANGIOPLASTY WITH STENT PLACEMENT     Comment:  [facility] 02/23/2017: ESOPHAGOGASTRODUODENOSCOPY (EGD) WITH PROPOFOL ; N/A     Comment:  Procedure: ESOPHAGOGASTRODUODENOSCOPY (EGD) WITH               PROPOFOL ;  Surgeon: Gaylyn Lunger PENNER, MD;  Location:               [facility] END[facility]COPY;  Service: Endoscopy;  Laterality: N/A; No date: EYE SURGERY 03/04/2020: PULSE GENERATOR IMPLANT; N/A     Comment:  Procedure: PULSE GENERATOR PLACEMENT;  Surgeon: Bluford,  Elspeth, MD;  Location: [facility] ORS;  Service: Neurosurgery;               Laterality: N/A;  1ST CASE 2012: SHOULDER ARTHROSCOPY; Right 02/26/2020: THORACIC LAMINECTOMY FOR  SPINAL CORD STIMULATOR; N/A     Comment:  Procedure: THORACIC LAMINECTOMY FOR SPINAL CORD               STIMULATOR PADDLE TRIAL;  Surgeon: Bluford Elspeth, MD;                Location: [facility] ORS;  Service: Neurosurgery;  Laterality:               N/A;  1ST CASE 2007: TUMOR EXCISION  BMI    Body Mass Index: 28.98 kg/m      Reproductive/Obstetrics negative OB ROS                             Anesthesia Physical Anesthesia Plan  ASA: 3  Anesthesia Plan: General   Post-op Pain Management:    Induction: Intravenous  PONV Risk Score and Plan: Propofol  infusion and TIVA  Airway Management Planned: Natural Airway and Nasal Cannula  Additional Equipment:   Intra-op Plan:   Post-operative Plan:   Informed Consent: I have reviewed the patients History and Physical, chart, labs and discussed the procedure including the risks, benefits and alternatives for the proposed anesthesia with the patient or authorized representative who has indicated his/her understanding and acceptance.     Dental Advisory Given  Plan Discussed with: Anesthesiologist, CRNA and Surgeon  Anesthesia Plan Comments: (Patient consented for risks of anesthesia including but not limited to:  - adverse reactions to medications - risk of airway placement if required - damage to eyes, teeth, lips or other oral mucosa - nerve damage due to positioning  - sore throat or hoarseness - Damage to heart, brain, nerves, lungs, other parts of body or loss of life  Patient voiced understanding and assent.)       Anesthesia Quick Evaluation

## 2023-01-21 NOTE — Anesthesia Postprocedure Evaluation (Signed)
 Anesthesia Post Note  Patient: Walter Salas  Procedure(s) Performed: COLONOSCOPY ESOPHAGOGASTRODUODENOSCOPY (EGD) WITH PROPOFOL  BIOPSY POLYPECTOMY  Patient location during evaluation: Endoscopy Anesthesia Type: General Level of consciousness: awake and alert Pain management: pain level controlled Vital Signs Assessment: post-procedure vital signs reviewed and stable Respiratory status: spontaneous breathing, nonlabored ventilation, respiratory function stable and patient connected to nasal cannula oxygen Cardiovascular status: blood pressure returned to baseline and stable Postop Assessment: no apparent nausea or vomiting Anesthetic complications: no   No notable events documented.   Last Vitals:  Vitals:   01/21/23 0829 01/21/23 0839  BP: 127/68 131/72  Pulse: (!) 59 (!) 55  Resp: 11 13  Temp: (!) 36.1 C   SpO2: 98% 97%    Last Pain:  Vitals:   01/21/23 0839  TempSrc:   PainSc: 0-No pain                 Fairy POUR Zidane Renner

## 2023-01-21 NOTE — H&P (Signed)
 Pre-Procedure H&P   Patient ID: Walter Salas is a 68 y.o. male.  Gastroenterology Provider: Elspeth Ozell Jungling, DO  Referring Provider: Delmar Gails, NP PCP: Maribeth Camellia MATSU, MD  Date: 01/21/2023  HPI [name] is a 68 y.o. male who presents today for Esophagogastroduodenoscopy and Colonoscopy for Abdominal pain, gastric intestinal metaplasia, personal history of colon polyps .  Patient has had ongoing nausea since increasing his Ozempic  dose.  Despite stopping Ozempic  he still has the symptoms.  Appetite and weight has been stable.  No abdominal pain.  Hemorrhoids are regular with no melena or hematochezia.  Last underwent EGD in December 2022 with complete gastric intestinal metaplasia noted on biopsy.  Biopsies at that time were negative for H. pylori EOE and Barrett's esophagus.  EGD in 2019 raise concern for Barrett's esophagus.  At that time he was positive for H. pylori.  On colonoscopy at the same time he had 2 adenomatous polyps.  December 2015 he had 5 adenomatous polyps  Hemoglobin 14.2 MCV 85 platelets 1-97,000 creatinine 0.91  Cardiology ruled this acceptable risk per notation.  Last dose of Eliquis was Tuesday.  No longer on Ozempic    Past Medical History:  Diagnosis Date   (HFpEF) heart failure with preserved ejection fraction (HCC)    a. 2018 Echo: EF 60-65%, normal wall motion, Gr1DD, mild aortic valve calcification; b. 12/2017 Echo: EF 60-65%, no rwma, Ao root 4.3cm, Asc Ao 4.4 cm, mildly dil LA.   Agent orange exposure    Anginal pain (HCC)    Aortic atherosclerosis (HCC)    Arthritis    Atrial fibrillation (HCC)    B12 deficiency    Back pain    Bacteremia    Benign neoplasm of adrenal gland    CAD (coronary artery disease)    a. s/p PCI/DES to the LCx and RCA in 2014; b. 05/2016 MV: EF 55-65%. Small, mild defect in basal anterolateral location->infarct w/o ischemia.   Cancer (HCC)    skin   Carpal tunnel syndrome    Causalgia  of lower limb    Cervical disc disorder with radiculopathy    Cervical radiculopathy    Chronic cough    Chronic pain    Colon polyp    Degeneration of lumbar or lumbosacral intervertebral disc    Degenerative joint disease of right acromioclavicular joint    Depression    Diabetes mellitus without complication (HCC)    Diastasis recti    Diverticulosis    GERD (gastroesophageal reflux disease)    H. pylori infection    Hereditary and idiopathic peripheral neuropathy    History of CVA (cerebrovascular accident)    History of kidney stones    History of stomach ulcers    History of subdural hematoma 12/14/2012   HOH (hard of hearing)    Hx of laminectomy 1999   Hyperlipidemia    Hypertension    Lacunar infarct, acute (HCC)    Myocardial infarction (HCC)    Onychomycosis    Sleep apnea    Stroke (HCC)    Subdural hematoma (HCC) 12/14/2012   Syncope    a. Event monitor 2018: NSR with first-degree AV block, no significant arrhythmias or pauses; b.  Myoview  showed a small in size, mild in severity fixed basal and anterior lateral defect, no ischemia, LVEF 55 to 65%, low risk study   Thoracic aortic aneurysm (HCC)    a. TTE 2018: Mildly dil Ao root- 4.2 cm; b. CTA aorta 6/18:  Asc Ao aneurysmal dil w max diam of 4.3 cm; b. 12/2019 CTA Chest: stable 4.3cm thor Ao Aneurysm.   Thoracic aortic aneurysm without rupture (HCC)    Thyroid  nodule    Tremor    Ulcer    Vitamin D  deficiency     Past Surgical History:  Procedure Laterality Date   ATRIAL FIBRILLATION ABLATION N/A 10/06/2022   Procedure: ATRIAL FIBRILLATION ABLATION;  Surgeon: Inocencio Soyla Lunger, MD;  Location: [facility] INVASIVE CV LAB;  Service: Cardiovascular;  Laterality: N/A;   BACK SURGERY  1993, 1994, 1997, 1998   BLEPHAROPLASTY     CARDIOVERSION N/A 06/05/2022   Procedure: CARDIOVERSION;  Surgeon: Lonni Slain, MD;  Location: [facility] INVASIVE CV LAB;  Service: Cardiovascular;  Laterality: N/A;   CAROTID STENT      CARPAL TUNNEL RELEASE Bilateral 1990   CATARACT EXTRACTION W/PHACO Left 03/08/2018   Procedure: CATARACT EXTRACTION PHACO AND INTRAOCULAR LENS PLACEMENT (IOC) LEFT;  Surgeon: Jaye Fallow, MD;  Location: [facility] ORS;  Service: Ophthalmology;  Laterality: Left;  US   00:24.2 CDE 3.25 Fluid pack lot # [id number]   CATARACT EXTRACTION W/PHACO Right 05/24/2018   Procedure: CATARACT EXTRACTION PHACO AND INTRAOCULAR LENS PLACEMENT (IOC) RIGHT;  Surgeon: Jaye Fallow, MD;  Location: [facility];  Service: Ophthalmology;  Laterality: Right;  Diabetic - oral meds   CERVICAL FUSION  2000. 2003   CHOLECYSTECTOMY  2000   COLONOSCOPY WITH PROPOFOL  N/A 02/23/2017   Procedure: COLONOSCOPY WITH PROPOFOL ;  Surgeon: Gaylyn Lunger PENNER, MD;  Location: [facility] ENDOSCOPY;  Service: Endoscopy;  Laterality: N/A;   CORONARY ANGIOPLASTY WITH STENT PLACEMENT  2014   [facility], [facility]   ESOPHAGOGASTRODUODENOSCOPY (EGD) WITH PROPOFOL  N/A 02/23/2017   Procedure: ESOPHAGOGASTRODUODENOSCOPY (EGD) WITH PROPOFOL ;  Surgeon: Gaylyn Lunger PENNER, MD;  Location: [facility] ENDOSCOPY;  Service: Endoscopy;  Laterality: N/A;   EYE SURGERY     PULSE GENERATOR IMPLANT N/A 03/04/2020   Procedure: PULSE GENERATOR PLACEMENT;  Surgeon: Bluford Standing, MD;  Location: [facility] ORS;  Service: Neurosurgery;  Laterality: N/A;  1ST CASE   SHOULDER ARTHROSCOPY Right 2012   THORACIC LAMINECTOMY FOR SPINAL CORD STIMULATOR N/A 02/26/2020   Procedure: THORACIC LAMINECTOMY FOR SPINAL CORD STIMULATOR PADDLE TRIAL;  Surgeon: Bluford Standing, MD;  Location: [facility] ORS;  Service: Neurosurgery;  Laterality: N/A;  1ST CASE   TUMOR EXCISION  2007    Family History No h/o GI disease or malignancy  Review of Systems  Constitutional:  Negative for activity change, appetite change, chills, diaphoresis, fatigue, fever and unexpected weight change.  HENT:  Negative for trouble swallowing and voice change.   Respiratory:  Negative for  shortness of breath and wheezing.   Cardiovascular:  Negative for chest pain, palpitations and leg swelling.  Gastrointestinal:  Positive for nausea and vomiting. Negative for abdominal distention, abdominal pain, anal bleeding, blood in stool, constipation and diarrhea.  Musculoskeletal:  Negative for arthralgias and myalgias.  Skin:  Negative for color change and pallor.  Neurological:  Negative for dizziness, syncope and weakness.  Psychiatric/Behavioral:  Negative for confusion. The patient is not nervous/anxious.   All other systems reviewed and are negative.    Medications No current facility-administered medications on file prior to encounter.   Current Outpatient Medications on File Prior to Encounter  Medication Sig Dispense Refill   cyclobenzaprine (FLEXERIL) 10 MG tablet Take 10 mg by mouth 3 (three) times daily as needed for muscle spasms.     potassium chloride  SA (K-DUR,KLOR-CON ) 20 MEQ tablet  Take 1 tablet (20 mEq total) by mouth daily. 90 tablet 3   vitamin B-12 (CYANOCOBALAMIN ) 1000 MCG tablet Take 1 tablet (1,000 mcg total) by mouth daily. 30 tablet 0   apixaban (ELIQUIS) 5 MG TABS tablet Take 5 mg by mouth 2 (two) times daily.     aspirin  81 MG EC tablet Take 81 mg by mouth daily.     atorvastatin  (LIPITOR) 80 MG tablet Take 80 mg by mouth daily.     carbidopa -levodopa  (SINEMET  IR) 25-100 MG tablet Take 1 tablet by mouth 3 (three) times daily.     chlorthalidone  (HYGROTON ) 50 MG tablet Take 50 mg by mouth daily.     DULoxetine  (CYMBALTA ) 20 MG capsule Take 20 mg by mouth daily.     ezetimibe  (ZETIA ) 10 MG tablet TAKE 1 TABLET BY MOUTH DAILY. 90 tablet 3   fenofibrate  (TRICOR ) 145 MG tablet Take 1 tablet (145 mg total) by mouth daily. 30 tablet 0   glucose blood test strip Check once daily, E11.9 100 each 12   losartan  (COZAAR ) 25 MG tablet Take 25 mg by mouth daily.     metFORMIN (GLUCOPHAGE) 1000 MG tablet Take 1,000 mg by mouth 2 (two) times daily with a meal.      methyl salicylate liquid Apply 1 application topically as needed for muscle pain.     omeprazole (PRILOSEC) 20 MG capsule Take 20 mg by mouth daily.     polyvinyl alcohol  (LIQUIFILM TEARS) 1.4 % ophthalmic solution Place 1 drop into both eyes as needed for dry eyes.      pregabalin  (LYRICA ) 75 MG capsule Take 75 mg by mouth 3 (three) times daily.     Semaglutide ,0.25 or 0.5MG /DOS, 2 MG/1.5ML SOPN Inject 0.25 mg into the skin once a week. (Patient not taking: Reported on 01/21/2023)     tamsulosin  (FLOMAX ) 0.4 MG CAPS capsule TAKE 1 CAPSULE BY MOUTH ONCE DAILY 30 capsule 3   VASCEPA  1 g capsule Take 2 g by mouth 2 (two) times daily.      Pertinent medications related to GI and procedure were reviewed by me with the patient prior to the procedure   Current Facility-Administered Medications:    0.9 %  sodium chloride  infusion, , Intravenous, Continuous, Onita Elspeth Sharper, DO, Last Rate: 20 mL/hr at 01/21/23 0726, New Bag at 01/21/23 0726  sodium chloride  20 mL/hr at 01/21/23 9273       Allergies  Allergen Reactions   Gabapentin Other (See Comments)    makes me crazy MENTAL STATUS CHANGES    Isosorbide  Mononitrate Er [Isosorbide  Dinitrate] Other (See Comments)    Headache   Lisinopril Cough   Tylenol  [Acetaminophen ] Other (See Comments)    GI upset   Allergies were reviewed by me prior to the procedure  Objective   Body mass index is 28.98 kg/m. Vitals:   01/21/23 0714  BP: 131/71  Pulse: (!) 56  Resp: 16  Temp: (!) 97.4 F (36.3 C)  TempSrc: Temporal  SpO2: 97%  Weight: 91.6 kg  Height: 5' 10 (1.778 m)     Physical Exam Vitals and nursing note reviewed.  Constitutional:      General: He is not in acute distress.    Appearance: Normal appearance. He is not ill-appearing, toxic-appearing or diaphoretic.  HENT:     Head: Normocephalic and atraumatic.     Nose: Nose normal.     Mouth/Throat:     Mouth: Mucous membranes are moist.     Pharynx: Oropharynx  is  clear.  Eyes:     General: No scleral icterus.    Extraocular Movements: Extraocular movements intact.  Cardiovascular:     Rate and Rhythm: Regular rhythm. Bradycardia present.     Heart sounds: Normal heart sounds. No murmur heard.    No friction rub. No gallop.  Pulmonary:     Effort: Pulmonary effort is normal. No respiratory distress.     Breath sounds: Normal breath sounds. No wheezing, rhonchi or rales.  Abdominal:     General: Bowel sounds are normal. There is no distension.     Palpations: Abdomen is soft.     Tenderness: There is no abdominal tenderness. There is no guarding or rebound.  Musculoskeletal:     Cervical back: Neck supple.     Right lower leg: No edema.     Left lower leg: No edema.     Comments: Nerve stimulator right buttock  Skin:    General: Skin is warm and dry.     Coloration: Skin is not jaundiced or pale.  Neurological:     General: No focal deficit present.     Mental Status: He is alert and oriented to person, place, and time. Mental status is at baseline.  Psychiatric:        Mood and Affect: Mood normal.        Behavior: Behavior normal.        Thought Content: Thought content normal.        Judgment: Judgment normal.      Assessment:  Mr. GERALDINE TESAR is a 68 y.o. male  who presents today for Esophagogastroduodenoscopy and Colonoscopy for Abdominal pain, gastric intestinal metaplasia, personal history of colon polyps .  Plan:  Esophagogastroduodenoscopy and Colonoscopy with possible intervention today  Esophagogastroduodenoscopy and Colonoscopy with possible biopsy, control of bleeding, polypectomy, and interventions as necessary has been discussed with the patient/patient representative. Informed consent was obtained from the patient/patient representative after explaining the indication, nature, and risks of the procedure including but not limited to death, bleeding, perforation, missed neoplasm/lesions, cardiorespiratory compromise,  and reaction to medications. Opportunity for questions was given and appropriate answers were provided. Patient/patient representative has verbalized understanding is amenable to undergoing the procedure.   Elspeth Ozell Jungling, DO  [facility]  Portions of the record may have been created with voice recognition software. Occasional wrong-word or 'sound-a-like' substitutions may have occurred due to the inherent limitations of voice recognition software.  Read the chart carefully and recognize, using context, where substitutions may have occurred.

## 2023-01-21 NOTE — Op Note (Signed)
 [facility] Patient Name: Walter Salas Procedure Date: 01/21/2023 7:36 AM MRN: [id number] Account #: [id number] Date of Birth: 12/15/1955 Admit Type: Outpatient Age: 68 Room: [facility] ENDO ROOM 1 Gender: Male Note Status: Finalized Instrument Name: Upper Endoscope [id number] Procedure:             Upper GI endoscopy Indications:           Dyspepsia Providers:             Elspeth Ozell Onita ROSALEA, DO Referring MD:          Camellia MATSU. Maribeth (Referring MD) Medicines:             Monitored Anesthesia Care Complications:         No immediate complications. Estimated blood loss:                         Minimal. Procedure:             Pre-Anesthesia Assessment:                        - Prior to the procedure, a History and Physical was                         performed, and patient medications and allergies were                         reviewed. The patient is competent. The risks and                         benefits of the procedure and the sedation options and                         risks were discussed with the patient. All questions                         were answered and informed consent was obtained.                         Patient identification and proposed procedure were                         verified by the physician, the nurse, the anesthetist                         and the technician in the endoscopy suite. Mental                         Status Examination: alert and oriented. Airway                         Examination: normal oropharyngeal airway and neck                         mobility. Respiratory Examination: clear to                         auscultation. CV Examination: RRR, no murmurs, no S3  or S4. Prophylactic Antibiotics: The patient does not                         require prophylactic antibiotics. Prior                         Anticoagulants: The patient has taken Eliquis                         (apixaban),  last dose was 2 days prior to procedure.                         ASA Grade Assessment: III - A patient with severe                         systemic disease. After reviewing the risks and                         benefits, the patient was deemed in satisfactory                         condition to undergo the procedure. The anesthesia                         plan was to use monitored anesthesia care (MAC).                         Immediately prior to administration of medications,                         the patient was re-assessed for adequacy to receive                         sedatives. The heart rate, respiratory rate, oxygen                         saturations, blood pressure, adequacy of pulmonary                         ventilation, and response to care were monitored                         throughout the procedure. The physical status of the                         patient was re-assessed after the procedure.                        After obtaining informed consent, the endoscope was                         passed under direct vision. Throughout the procedure,                         the patient's blood pressure, pulse, and oxygen                         saturations were monitored continuously. The Endoscope  was introduced through the mouth, and advanced to the                         second part of duodenum. The upper GI endoscopy was                         accomplished without difficulty. The patient tolerated                         the procedure well. Findings:      The duodenal bulb, first portion of the duodenum and second portion of       the duodenum were normal. Estimated blood loss: none.      Hematin (altered blood/coffee-ground-like material) was found in the       cardia and in the gastric antrum. Estimated blood loss: none.      Localized mild mucosal changes characterized by discoloration were found       in the gastric antrum. Biopsies were  taken with a cold forceps for       histology. Biopsies were taken from the incisura and antrum and the body       and placed in two separate jars given history of gastric intestinal       metaplasia and h pylori. Estimated blood loss was minimal. Imaging was       performed using white light and narrow band imaging to visualize the       mucosa. The entire stomach was evaluated with nbi.      A small hiatal hernia was present. Estimated blood loss: none.      The exam of the stomach was otherwise normal.      Esophagogastric landmarks were identified: the gastroesophageal junction       was found at 46 cm from the incisors.      The Z-line was irregular. Tongue of salmon colored mucosa ~1cm in       length. Biopsies were taken with a cold forceps for histology. Estimated       blood loss was minimal. Imaging was performed using white light and       narrow band imaging to visualize the mucosa.      The exam of the esophagus was otherwise normal. Impression:            - Normal duodenal bulb, first portion of the duodenum                         and second portion of the duodenum.                        - Hematin (altered blood/coffee-ground-like material)                         in the gastric antrum and in the cardia.                        - Discolored mucosa in the antrum. Biopsied.                        - Small hiatal hernia.                        - Esophagogastric landmarks  identified.                        - Z-line irregular. Biopsied. Recommendation:        - Patient has a contact number available for                         emergencies. The signs and symptoms of potential                         delayed complications were discussed with the patient.                         Return to normal activities tomorrow. Written                         discharge instructions were provided to the patient.                        - Discharge patient to home.                        - Resume  previous diet.                        - Continue present medications.                        - Await pathology results.                        - Repeat upper endoscopy for surveillance based on                         pathology results.                        - Return to GI clinic as previously scheduled.                        - Proceed with colonoscopy. see op report for further                         recommendations regarding eliquis.                        - The findings and recommendations were discussed with                         the patient. Procedure Code(s):     --- Professional ---                        [phone], Esophagogastroduodenoscopy, flexible,                         transoral; with biopsy, single or multiple Diagnosis Code(s):     --- Professional ---                        K92.2, Gastrointestinal hemorrhage, unspecified  K31.89, Other diseases of stomach and duodenum                        K44.9, Diaphragmatic hernia without obstruction or                         gangrene                        K22.89, Other specified disease of esophagus                        R10.13, Epigastric pain CPT copyright 2022 American [employer] Association. All rights reserved. The codes documented in this report are preliminary and upon coder review may  be revised to meet current compliance requirements. Attending Participation:      I personally performed the entire procedure. Elspeth Jungling, DO Elspeth Ozell Jungling DO, DO 01/21/2023 7:53:37 AM This report has been signed electronically. Number of Addenda: 0 Note Initiated On: 01/21/2023 7:36 AM Estimated Blood Loss:  Estimated blood loss was minimal.      [facility]

## 2023-01-21 NOTE — Interval H&P Note (Signed)
 History and Physical Interval Note: Preprocedure H&P from 01/21/23  was reviewed and there was no interval change after seeing and examining the patient.  Written consent was obtained from the patient after discussion of risks, benefits, and alternatives. Patient has consented to proceed with Esophagogastroduodenoscopy and Colonoscopy with possible intervention    01/21/2023 7:34 AM  Walter Salas  has presented today for surgery, with the diagnosis of HX OF ADENOMATOUS COLONIC POLYPS R10.13 (ICD-10-CM) - Dyspepsia.  The various methods of treatment have been discussed with the patient and family. After consideration of risks, benefits and other options for treatment, the patient has consented to  Procedure(s): COLONOSCOPY (N/A) ESOPHAGOGASTRODUODENOSCOPY (EGD) WITH PROPOFOL  (N/A) as a surgical intervention.  The patient's history has been reviewed, patient examined, no change in status, stable for surgery.  I have reviewed the patient's chart and labs.  Questions were answered to the patient's satisfaction.     Elspeth Ozell Jungling

## 2023-01-21 NOTE — Op Note (Signed)
 [facility] Gastroenterology Patient Name: Walter Salas Procedure Date: 01/21/2023 7:34 AM MRN: [id number] Account #: [id number] Date of Birth: 02-14-1955 Admit Type: Outpatient Age: 68 Room: [facility] ENDO ROOM 1 Gender: Male Note Status: Finalized Instrument Name: Colonoscope [id number] Procedure:             Colonoscopy Indications:           High risk colon cancer surveillance: Personal history                         of colonic polyps Providers:             Elspeth Ozell Onita ROSALEA, DO Referring MD:          Camellia MATSU. Maribeth (Referring MD) Medicines:             Monitored Anesthesia Care Complications:         No immediate complications. Estimated blood loss:                         Minimal. Procedure:             Pre-Anesthesia Assessment:                        - Prior to the procedure, a History and Physical was                         performed, and patient medications and allergies were                         reviewed. The patient is competent. The risks and                         benefits of the procedure and the sedation options and                         risks were discussed with the patient. All questions                         were answered and informed consent was obtained.                         Patient identification and proposed procedure were                         verified by the physician, the nurse, the anesthetist                         and the technician in the endoscopy suite. Mental                         Status Examination: alert and oriented. Airway                         Examination: normal oropharyngeal airway and neck                         mobility. Respiratory Examination: clear to  auscultation. CV Examination: RRR, no murmurs, no S3                         or S4. Prophylactic Antibiotics: The patient does not                         require prophylactic antibiotics. Prior                          Anticoagulants: The patient has taken Eliquis                         (apixaban), last dose was 2 days prior to procedure.                         ASA Grade Assessment: III - A patient with severe                         systemic disease. After reviewing the risks and                         benefits, the patient was deemed in satisfactory                         condition to undergo the procedure. The anesthesia                         plan was to use monitored anesthesia care (MAC).                         Immediately prior to administration of medications,                         the patient was re-assessed for adequacy to receive                         sedatives. The heart rate, respiratory rate, oxygen                         saturations, blood pressure, adequacy of pulmonary                         ventilation, and response to care were monitored                         throughout the procedure. The physical status of the                         patient was re-assessed after the procedure.                        After obtaining informed consent, the colonoscope was                         passed under direct vision. Throughout the procedure,                         the patient's blood pressure, pulse, and oxygen  saturations were monitored continuously. The                         Colonoscope was introduced through the anus and                         advanced to the the cecum, identified by appendiceal                         orifice and ileocecal valve. The colonoscopy was                         performed without difficulty. The patient tolerated                         the procedure well. The quality of the bowel                         preparation was evaluated using the BBPS [facility] Bowel                         Preparation Scale) with scores of: Right Colon = 2                         (minor amount of residual staining, small fragments of                          stool and/or opaque liquid, but mucosa seen well),                         Transverse Colon = 2 (minor amount of residual                         staining, small fragments of stool and/or opaque                         liquid, but mucosa seen well) and Left Colon = 2                         (minor amount of residual staining, small fragments of                         stool and/or opaque liquid, but mucosa seen well). The                         total BBPS score equals 6. The quality of the bowel                         preparation was good. The ileocecal valve, appendiceal                         orifice, and rectum were photographed. Findings:      The perianal and digital rectal examinations were normal. Pertinent       negatives include normal sphincter tone.      Skin tags were found on perianal exam.      Multiple small-mouthed diverticula were found in the left colon.  Estimated blood loss: none.      Non-bleeding internal hemorrhoids were found during retroflexion. The       hemorrhoids were Grade I (internal hemorrhoids that do not prolapse).       Estimated blood loss: none.      Three sessile polyps were found in the ascending colon (2) and cecum       (1). The polyps were 1 to 2 mm in size. These polyps were removed with a       jumbo cold forceps. Resection and retrieval were complete. Estimated       blood loss was minimal.      A 5 to 6 mm polyp was found in the transverse colon. The polyp was       pedunculated. The polyp was removed with a hot snare. Resection and       retrieval were complete. Estimated blood loss was minimal.      The exam was otherwise without abnormality on direct and retroflexion       views. Impression:            - Perianal skin tags found on perianal exam.                        - Diverticulosis in the left colon.                        - Non-bleeding internal hemorrhoids.                        - Three 1 to 2 mm polyps in the  ascending colon and in                         the cecum, removed with a jumbo cold forceps. Resected                         and retrieved.                        - One 5 to 6 mm polyp in the transverse colon, removed                         with a hot snare. Resected and retrieved.                        - The examination was otherwise normal on direct and                         retroflexion views. Recommendation:        - Patient has a contact number available for                         emergencies. The signs and symptoms of potential                         delayed complications were discussed with the patient.                         Return to normal activities tomorrow. Written  discharge instructions were provided to the patient.                        - Discharge patient to home.                        - Resume previous diet.                        - Continue present medications.                        - No ibuprofen , naproxen, or other non-steroidal                         anti-inflammatory drugs for 5 days after polyp removal.                        - Resume Eliquis (apixaban) at prior dose in 2 days.                         Refer to managing physician for further adjustment of                         therapy.                        - Await pathology results.                        - Repeat colonoscopy for surveillance based on                         pathology results.                        - Return to referring physician as previously                         scheduled.                        - The findings and recommendations were discussed with                         the patient. Procedure Code(s):     --- Professional ---                        [phone], Colonoscopy, flexible; with removal of                         tumor(s), polyp(s), or other lesion(s) by snare                         technique                        45380, 59, Colonoscopy,  flexible; with biopsy, single                         or multiple Diagnosis Code(s):     --- Professional ---  Z86.010, Personal history of colonic polyps                        K64.0, First degree hemorrhoids                        D12.2, Benign neoplasm of ascending colon                        D12.0, Benign neoplasm of cecum                        D12.3, Benign neoplasm of transverse colon (hepatic                         flexure or splenic flexure)                        K64.4, Residual hemorrhoidal skin tags                        K57.30, Diverticulosis of large intestine without                         perforation or abscess without bleeding CPT copyright 2022 [employer]. All rights reserved. The codes documented in this report are preliminary and upon coder review may  be revised to meet current compliance requirements. Attending Participation:      I personally performed the entire procedure. Elspeth Jungling, DO Elspeth Ozell Jungling DO, DO 01/21/2023 8:24:27 AM This report has been signed electronically. Number of Addenda: 0 Note Initiated On: 01/21/2023 7:34 AM Scope Withdrawal Time: 0 hours 15 minutes 14 seconds  Total Procedure Duration: 0 hours 18 minutes 33 seconds  Estimated Blood Loss:  Estimated blood loss was minimal.      [facility]

## 2023-01-21 NOTE — Anesthesia Procedure Notes (Signed)
 Procedure Name: General with mask airway Date/Time: 01/21/2023 7:40 AM  Performed by: Ledora Duncan, CRNAPre-anesthesia Checklist: Patient identified, Emergency Drugs available, Suction available and Patient being monitored Patient Re-evaluated:Patient Re-evaluated prior to induction Oxygen Delivery Method: Simple face mask Induction Type: IV induction Placement Confirmation: positive ETCO2, CO2 detector and breath sounds checked- equal and bilateral Dental Injury: Teeth and Oropharynx as per pre-operative assessment

## 2023-01-22 ENCOUNTER — Ambulatory Visit: Payer: Medicare Other | Attending: Physician Assistant | Admitting: Physician Assistant

## 2023-01-22 ENCOUNTER — Encounter: Payer: Self-pay | Admitting: Gastroenterology

## 2023-01-22 VITALS — BP 126/64 | HR 62 | Resp 16 | Ht 70.0 in | Wt 207.0 lb

## 2023-01-22 DIAGNOSIS — I10 Essential (primary) hypertension: Secondary | ICD-10-CM

## 2023-01-22 DIAGNOSIS — D6869 Other thrombophilia: Secondary | ICD-10-CM

## 2023-01-22 DIAGNOSIS — I4819 Other persistent atrial fibrillation: Secondary | ICD-10-CM | POA: Diagnosis not present

## 2023-01-22 DIAGNOSIS — I2510 Atherosclerotic heart disease of native coronary artery without angina pectoris: Secondary | ICD-10-CM

## 2023-01-22 DIAGNOSIS — I739 Peripheral vascular disease, unspecified: Secondary | ICD-10-CM | POA: Diagnosis not present

## 2023-01-22 NOTE — Patient Instructions (Signed)
  Follow-Up: At [facility], you and your health needs are our priority.  As part of our continuing mission to provide you with exceptional heart care, we have created designated Provider Care Teams.  These Care Teams include your primary Cardiologist (physician) and Advanced Practice Providers (APPs -  Physician Assistants and Nurse Practitioners) who all work together to provide you with the care you need, when you need it.   Your next appointment:   4 month(s)  Provider:   You will see one of the following Advanced Practice Providers on your designated Care Team:   Elvie Needle, NP

## 2023-01-25 LAB — SURGICAL PATHOLOGY

## 2023-01-26 ENCOUNTER — Other Ambulatory Visit: Payer: Self-pay | Admitting: Family Medicine

## 2023-01-26 DIAGNOSIS — M545 Low back pain, unspecified: Secondary | ICD-10-CM

## 2023-01-26 DIAGNOSIS — G8929 Other chronic pain: Secondary | ICD-10-CM

## 2023-02-17 ENCOUNTER — Other Ambulatory Visit: Payer: Self-pay | Admitting: Family Medicine

## 2023-02-25 ENCOUNTER — Other Ambulatory Visit: Payer: Self-pay | Admitting: Family Medicine

## 2023-02-25 DIAGNOSIS — G8929 Other chronic pain: Secondary | ICD-10-CM

## 2023-03-04 ENCOUNTER — Encounter: Payer: Self-pay | Admitting: Cardiovascular Disease

## 2023-03-04 ENCOUNTER — Ambulatory Visit: Payer: Medicare Other | Attending: Cardiovascular Disease | Admitting: Cardiovascular Disease

## 2023-03-04 VITALS — BP 128/50 | HR 70 | Ht 70.0 in | Wt 211.4 lb

## 2023-03-04 DIAGNOSIS — E785 Hyperlipidemia, unspecified: Secondary | ICD-10-CM

## 2023-03-04 DIAGNOSIS — I10 Essential (primary) hypertension: Secondary | ICD-10-CM | POA: Diagnosis not present

## 2023-03-04 DIAGNOSIS — I5032 Chronic diastolic (congestive) heart failure: Secondary | ICD-10-CM | POA: Diagnosis not present

## 2023-03-04 DIAGNOSIS — I2510 Atherosclerotic heart disease of native coronary artery without angina pectoris: Secondary | ICD-10-CM

## 2023-03-04 DIAGNOSIS — I7121 Aneurysm of the ascending aorta, without rupture: Secondary | ICD-10-CM | POA: Diagnosis not present

## 2023-03-04 DIAGNOSIS — I739 Peripheral vascular disease, unspecified: Secondary | ICD-10-CM

## 2023-03-04 NOTE — Patient Instructions (Signed)
Medication Instructions:  No changes *If you need a refill on your cardiac medications before your next appointment, please call your pharmacy*   Lab Work: None ordered If you have labs (blood work) drawn today and your tests are completely normal, you will receive your results only by: MyChart Message (if you have MyChart) OR A paper copy in the mail If you have any lab test that is abnormal or we need to change your treatment, we will call you to review the results.   Testing/Procedures: None ordered   Follow-Up: At [facility], you and your health needs are our priority.  As part of our continuing mission to provide you with exceptional heart care, we have created designated Provider Care Teams.  These Care Teams include your primary Cardiologist (physician) and Advanced Practice Providers (APPs -  Physician Assistants and Nurse Practitioners) who all work together to provide you with the care you need, when you need it.  We recommend signing up for the patient portal called "MyChart".  Sign up information is provided on this After Visit Summary.  MyChart is used to connect with patients for Virtual Visits (Telemedicine).  Patients are able to view lab/test results, encounter notes, upcoming appointments, etc.  Non-urgent messages can be sent to your provider as well.   To learn more about what you can do with MyChart, go to [email or web address].    Your next appointment:   3 month(s)  Provider:   You may see Dr. Kirke Corin or one of the following Advanced Practice Providers on your designated Care Team:   Nicolasa Ducking, NP Eula Listen, PA-C Cadence Fransico Michael, PA-C Charlsie Quest, NP Carlos Levering, NP

## 2023-03-04 NOTE — Progress Notes (Signed)
Cardiology Office Note   Date:  03/04/2023   ID:  Gerrad, Welker [birth year], MRN [id number]  PCP:  Glori Luis, MD  Cardiologist:  Dr. Okey Dupre  Chief Complaint  Patient presents with   Follow up aneurysm of ascending aorta without rupture.     Patient c/o bialteral leg pain with walking.       History of Present Illness: Walter Salas is a 68 y.o. male who was referred by Charlsie Quest for evaluation management of peripheral arterial disease. He has known history of coronary artery disease status post PCI, obstructive sleep apnea, type 2 diabetes, GERD, hyperlipidemia, CVA, previous tobacco use, ascending aortic aneurysm, persistent atrial fibrillation and previous fall resulting in subdural hematoma.  He reports having 11 back surgeries and is about to get his 12th surgery done in early March.  He complains of bilateral leg discomfort with minimal walking.  The pain starts in the anterior thigh area going down to his knees.  He has some mild Discomfort.  Symptoms are worse on the right than the left.  He underwent noninvasive Doppler studies in November which showed an ABI of 0.89 on the right and 0.82 on the left. He underwent CTA of the chest in December 2024 which showed a stable 4.3 cm ascending thoracic aortic aneurysm.  Duplex showed significant stenosis in the left distal SFA.  Past Medical History:  Diagnosis Date   (HFpEF) heart failure with preserved ejection fraction (HCC)    a. 2018 Echo: EF 60-65%, normal wall motion, Gr1DD, mild aortic valve calcification; b. 12/2017 Echo: EF 60-65%, no rwma, Ao root 4.3cm, Asc Ao 4.4 cm, mildly dil LA.   Agent orange exposure    Anginal pain (HCC)    Aortic atherosclerosis (HCC)    Arthritis    Atrial fibrillation (HCC)    B12 deficiency    Back pain    Bacteremia    Benign neoplasm of adrenal gland    CAD (coronary artery disease)    a. s/p PCI/DES to the LCx and RCA in 2014; b. 05/2016 MV: EF 55-65%. Small, mild  defect in basal anterolateral location->infarct w/o ischemia.   Cancer (HCC)    skin   Carpal tunnel syndrome    Causalgia of lower limb    Cervical disc disorder with radiculopathy    Cervical radiculopathy    Chronic cough    Chronic pain    Colon polyp    Degeneration of lumbar or lumbosacral intervertebral disc    Degenerative joint disease of right acromioclavicular joint    Depression    Diabetes mellitus without complication (HCC)    Diastasis recti    Diverticulosis    GERD (gastroesophageal reflux disease)    H. pylori infection    Hereditary and idiopathic peripheral neuropathy    History of CVA (cerebrovascular accident)    History of kidney stones    History of stomach ulcers    History of subdural hematoma 12/14/2012   HOH (hard of hearing)    Hx of laminectomy 1999   Hyperlipidemia    Hypertension    Lacunar infarct, acute (HCC)    Myocardial infarction (HCC)    Onychomycosis    Sleep apnea    Stroke (HCC)    Subdural hematoma (HCC) 12/14/2012   Syncope    a. Event monitor 2018: NSR with first-degree AV block, no significant arrhythmias or pauses; b.  Myoview showed a small in size, mild in severity fixed basal and  anterior lateral defect, no ischemia, LVEF 55 to 65%, low risk study   Thoracic aortic aneurysm (HCC)    a. TTE 2018: Mildly dil Ao root- 4.2 cm; b. CTA aorta 6/18: Asc Ao aneurysmal dil w max diam of 4.3 cm; b. 12/2019 CTA Chest: stable 4.3cm thor Ao Aneurysm.   Thoracic aortic aneurysm without rupture (HCC)    Thyroid nodule    Tremor    Ulcer    Vitamin D deficiency     Past Surgical History:  Procedure Laterality Date   ATRIAL FIBRILLATION ABLATION N/A 10/06/2022   Procedure: ATRIAL FIBRILLATION ABLATION;  Surgeon: Regan Lemming, MD;  Location: [facility] INVASIVE CV LAB;  Service: Cardiovascular;  Laterality: N/A;   BACK SURGERY  1993, 1994, 1997, 1998   BIOPSY  01/21/2023   Procedure: BIOPSY;  Surgeon: Jaynie Collins, DO;  Location:  [facility] ENDOSCOPY;  Service: Gastroenterology;;   BLEPHAROPLASTY     CARDIOVERSION N/A 06/05/2022   Procedure: CARDIOVERSION;  Surgeon: Jodelle Red, MD;  Location: [facility] INVASIVE CV LAB;  Service: Cardiovascular;  Laterality: N/A;   CAROTID STENT     CARPAL TUNNEL RELEASE Bilateral 1990   CATARACT EXTRACTION W/PHACO Left 03/08/2018   Procedure: CATARACT EXTRACTION PHACO AND INTRAOCULAR LENS PLACEMENT (IOC) LEFT;  Surgeon: Galen Manila, MD;  Location: [facility] ORS;  Service: Ophthalmology;  Laterality: Left;  [place]  00:24.2 CDE 3.25 Fluid pack lot # [id number]   CATARACT EXTRACTION W/PHACO Right 05/24/2018   Procedure: CATARACT EXTRACTION PHACO AND INTRAOCULAR LENS PLACEMENT (IOC) RIGHT;  Surgeon: Galen Manila, MD;  Location: [facility] SURGERY CNTR;  Service: Ophthalmology;  Laterality: Right;  Diabetic - oral meds   CERVICAL FUSION  2000. 2003   CHOLECYSTECTOMY  2000   COLONOSCOPY N/A 01/21/2023   Procedure: COLONOSCOPY;  Surgeon: Jaynie Collins, DO;  Location: [facility] ENDOSCOPY;  Service: Gastroenterology;  Laterality: N/A;   COLONOSCOPY WITH PROPOFOL N/A 02/23/2017   Procedure: COLONOSCOPY WITH PROPOFOL;  Surgeon: Christena Deem, MD;  Location: [facility] ENDOSCOPY;  Service: Endoscopy;  Laterality: N/A;   CORONARY ANGIOPLASTY WITH STENT PLACEMENT  2014   [facility]   ESOPHAGOGASTRODUODENOSCOPY (EGD) WITH PROPOFOL N/A 02/23/2017   Procedure: ESOPHAGOGASTRODUODENOSCOPY (EGD) WITH PROPOFOL;  Surgeon: Christena Deem, MD;  Location: [facility] ENDOSCOPY;  Service: Endoscopy;  Laterality: N/A;   ESOPHAGOGASTRODUODENOSCOPY (EGD) WITH PROPOFOL N/A 01/21/2023   Procedure: ESOPHAGOGASTRODUODENOSCOPY (EGD) WITH PROPOFOL;  Surgeon: Jaynie Collins, DO;  Location: [facility] ENDOSCOPY;  Service: Gastroenterology;  Laterality: N/A;   EYE SURGERY     POLYPECTOMY  01/21/2023   Procedure: POLYPECTOMY;  Surgeon: Jaynie Collins, DO;  Location: [facility] ENDOSCOPY;   Service: Gastroenterology;;   PULSE GENERATOR IMPLANT N/A 03/04/2020   Procedure: PULSE GENERATOR PLACEMENT;  Surgeon: Lucy Chris, MD;  Location: [facility] ORS;  Service: Neurosurgery;  Laterality: N/A;  1ST CASE   SHOULDER ARTHROSCOPY Right 2012   THORACIC LAMINECTOMY FOR SPINAL CORD STIMULATOR N/A 02/26/2020   Procedure: THORACIC LAMINECTOMY FOR SPINAL CORD STIMULATOR PADDLE TRIAL;  Surgeon: Lucy Chris, MD;  Location: [facility] ORS;  Service: Neurosurgery;  Laterality: N/A;  1ST CASE   TUMOR EXCISION  2007     Current Outpatient Medications  Medication Sig Dispense Refill   amiodarone (PACERONE) 200 MG tablet Take 1 tablet (200 mg total) by mouth daily. 90 tablet 3   amLODipine (NORVASC) 2.5 MG tablet Take 1 tablet (2.5 mg total) by mouth daily. TAKE ONE TABLET (2.5 MG) BY MOUTH EVERY DAY 90 tablet 3  apixaban (ELIQUIS) 5 MG TABS tablet Take 5 mg by mouth 2 (two) times daily.     aspirin 81 MG EC tablet Take 81 mg by mouth daily.     atorvastatin (LIPITOR) 80 MG tablet Take 80 mg by mouth daily.     carbidopa-levodopa (SINEMET IR) 25-100 MG tablet Take 1 tablet by mouth 3 (three) times daily.     chlorthalidone (HYGROTON) 50 MG tablet Take 50 mg by mouth daily.     cholecalciferol (VITAMIN D3) 25 MCG (1000 UNIT) tablet Take 4,000 Units by mouth daily.     cyclobenzaprine (FLEXERIL) 10 MG tablet Take 10 mg by mouth 3 (three) times daily as needed for muscle spasms.     DULoxetine (CYMBALTA) 20 MG capsule Take 20 mg by mouth daily.     ezetimibe (ZETIA) 10 MG tablet TAKE 1 TABLET BY MOUTH DAILY. 90 tablet 3   fenofibrate (TRICOR) 145 MG tablet Take 1 tablet (145 mg total) by mouth daily. 30 tablet 0   furosemide (LASIX) 20 MG tablet TAKE 1 TABLET BY MOUTH DAILY AS NEEDED (FOR WEIGHT GAIN >3 LBS OVER NIGHT OR >5 LBS/WEEK) 30 tablet 4   glucose blood test strip Check once daily, E11.9 100 each 12   icosapent Ethyl (VASCEPA) 1 g capsule TAKE 2 CAPSULES (2 GRAMS TOTAL) BY MOUTHTWICE DAILY 360  capsule 0   losartan (COZAAR) 25 MG tablet Take 25 mg by mouth daily.     metFORMIN (GLUCOPHAGE) 1000 MG tablet Take 1,000 mg by mouth 2 (two) times daily with a meal.     methyl salicylate liquid Apply 1 application topically as needed for muscle pain.     nitroGLYCERIN (NITROSTAT) 0.4 MG SL tablet Place 1 tablet (0.4 mg total) under the tongue every 5 (five) minutes as needed for chest pain. May take up to 3 doses. 30 tablet 0   omeprazole (PRILOSEC) 20 MG capsule Take 20 mg by mouth daily.     ondansetron (ZOFRAN) 4 MG tablet TAKE 1 TABLET BY MOUTH EVERY 8 HOURS AS NEEDED. 20 tablet 0   polyvinyl alcohol (LIQUIFILM TEARS) 1.4 % ophthalmic solution Place 1 drop into both eyes as needed for dry eyes.      potassium chloride SA (K-DUR,KLOR-CON) 20 MEQ tablet Take 1 tablet (20 mEq total) by mouth daily. 90 tablet 3   pregabalin (LYRICA) 75 MG capsule Take 75 mg by mouth 3 (three) times daily.     tamsulosin (FLOMAX) 0.4 MG CAPS capsule TAKE 1 CAPSULE BY MOUTH ONCE DAILY 30 capsule 3   traMADol (ULTRAM) 50 MG tablet TAKE TWO TABLETS BY MOUTH EVERY 8 HOURS AS NEEDED FOR PAIN 90 tablet 0   vitamin B-12 (CYANOCOBALAMIN) 1000 MCG tablet Take 1 tablet (1,000 mcg total) by mouth daily. 30 tablet 0   No current facility-administered medications for this visit.    Allergies:   Gabapentin, Isosorbide mononitrate er [isosorbide dinitrate], Lisinopril, and Tylenol [acetaminophen]    Social History:  The patient  reports that he quit smoking about 5 years ago. His smoking use included cigarettes. He started smoking about 55 years ago. He has a 50 pack-year smoking history. He has never used smokeless tobacco. He reports that he does not drink alcohol and does not use drugs.   Family History:  The patient's family history includes Diabetes in his brother, father, mother, and sister; Heart disease in his brother, father, paternal grandfather, and sister.    ROS:  Please see the history of present illness.  Otherwise, review of systems are positive for none.   All other systems are reviewed and negative.    PHYSICAL EXAM: VS:  BP (!) 130/52 (BP Location: Left Arm, Patient Position: Sitting, Cuff Size: Normal)   Pulse 70   Ht 5\' 10"  (1.778 m)   Wt 211 lb 6 oz (95.9 kg)   SpO2 92%   BMI 30.33 kg/m  , BMI Body mass index is 30.33 kg/m. GEN: Well nourished, well developed, in no acute distress  HEENT: normal  Neck: no JVD, carotid bruits, or masses Cardiac: RRR; no  rubs, or gallops,no edema .  1 out of 6 systolic murmur in the aortic area Respiratory:  clear to auscultation bilaterally, normal work of breathing GI: soft, nontender, nondistended, + BS MS: no deformity or atrophy  Skin: warm and dry, no rash Neuro:  Strength and sensation are intact Psych: euthymic mood, full affect Vascular: Femoral pulses +2 bilaterally.  Posterior tibial is not palpable on both sides.  Dorsalis pedis: +2 on the right and +1 on the left.   EKG:  EKG is ordered today. The ekg ordered today demonstrates : Sinus rhythm with 1st degree A-V block Prolonged QT When compared with ECG of 22-Jan-2023 09:06, Nonspecific T wave abnormality no longer evident in Inferior leads T wave inversion less evident in Anterolateral leads    Recent Labs: 04/08/2022: B Natriuretic Peptide 241.0 11/03/2022: ALT 32; Magnesium 2.3; Platelets 197; TSH 1.450 12/17/2022: BUN 22; Creatinine, Ser 1.30; Hemoglobin 14.3; Potassium 4.2; Sodium 138    Lipid Panel    Component Value Date/Time   CHOL 117 12/16/2022 1546   TRIG 170.0 (H) 12/16/2022 1546   HDL 36.20 (L) 12/16/2022 1546   CHOLHDL 3 12/16/2022 1546   VLDL 34.0 12/16/2022 1546   LDLCALC 47 12/16/2022 1546   LDLDIRECT 90.0 06/30/2021 0955      Wt Readings from Last 3 Encounters:  03/04/23 211 lb 6 oz (95.9 kg)  01/22/23 207 lb (93.9 kg)  01/21/23 202 lb (91.6 kg)          05/06/2016    2:00 PM  PAD Screen  Previous PAD dx? No  Previous surgical  procedure? No  Pain with walking? Yes  Subsides with rest? Yes  Feet/toe relief with dangling? No  Painful, non-healing ulcers? No  Extremities discolored? No      ASSESSMENT AND PLAN:  1.  Peripheral arterial disease: The patient has evidence of peripheral arterial disease by physical exam and noninvasive testing.  However, I suspect that the majority of his symptoms are likely due to pseudoclaudication related to his back situation.  He reports that his symptoms are worse on the right than the left but his peripheral arterial disease is clearly worse on the left by exam. I discussed with him the natural history and management of peripheral arterial disease.  I do not think he benefits from revascularization at this time. He does benefit of vascular rehab once he recovers from his upcoming back surgery.  2.  Coronary artery disease involving native coronary arteries without angina:: Continue medical therapy  3.  Chronic diastolic heart failure: He appears to be euvolemic  4.  Ascending aortic aneurysm: Stable in size at 4.3 cm.  I agree with annual surveillance  5.  Essential hypertension: Blood pressure is controlled  6.  Mixed hyperlipidemia: Most recent lipid profile showed an LDL of 47.     Disposition:   FU with me in 3 months  Signed,  Lorine Bears,  MD  03/04/2023 10:31 AM    [facility]

## 2023-03-05 NOTE — Progress Notes (Deleted)
 Cardiology Office Note:  .   Date:  03/05/2023  ID:  Walter Salas, DOB 08/23/1955, MRN [id number] PCP: Walter Luis, MD  [facility] Providers Cardiologist:  Walter Kendall, MD Electrophysiologist:  Walter Jorja Loa, MD { Click to update primary MD,subspecialty MD or APP then REFRESH:1}   History of Present Illness: .   Walter Salas is a 68 y.o. male with a past medical history of hypertension, CAD (PCI/DES to left circumflex and RCA at Walter Salas in 2014), OSA intolerant to CPAP, GERD, type 2 diabetes, AAA, hyperlipidemia, CVA (2013 in 2023), HFpEF, mild aortic root dilatation, tobacco use, mechanical fall with SDH, and persistent atrial fibrillation who is here today for follow-up on his coronary artery disease.   Previously underwent successful PCI/DES to Walter left circumflex and RCA in 2014 at Walter Salas. Establish care with Walter. Okey Salas in 2018 after several syncopal episodes. Myocardial perfusion study was overall low risk with a small defect of mild severity and suggestive of infarct without ischemia. Echocardiogram revealed an EF of 60-65%, G1 DD, aortic root was mildly dilated 42 mm and ascending aorta was mildly dilated at 38 mm. A 30-day event monitor revealed predominantly sinus rhythm and first-degree AV block and no significant arrhythmias or pauses. Repeat echocardiogram 02/17/2022 revealed an EF of 55 to 60%, mild asymmetric LVH in Walter basal septal segment, G2 DD, borderline aortic root dilatation at 39 mm, mild dilatation of Walter ascending aorta at 40 mm. He was admitted 02/2021 with a lunar infarct of Walter left sounds, CT of Walter head and neck showed mild to moderate atherosclerosis without large vessel occlusion. Admitted to Walter Salas on 04/08/2022 and found to be in new onset atrial fibrillation, volume overloaded with a BNP of 241. Echocardiogram revealed an EF of 55%, moderate concentric LVH, G1 DD, trivial MR, and he was transferred to Walter Walter Salas for continued care. He  underwent TEE/DCCV x 1 at 200 J with conversion to sinus bradycardia approximately 50 bpm. He was started on apixaban 5 mg twice daily and was ultimately discharged in sinus rhythm on 04/10/2022.  Followed with EP and underwent DCCV on 06/01/2019 and was shocked 150 J converted to normal sinus rhythm without any apparent complications.  Echocardiogram was repeated 09/11/2022 which revealed LVEF 60 to 65%, no RWMA, G2 DD, small pericardial effusion with no evidence of tamponade.  He presented again to Walter Salas 10/02/22 for ablation procedure for persistent atrial fibrillation.  He maintained sinus rhythm upon presentation.  He underwent successful electrical isolation and anatomical encircling of all 4 pulmonary veins and pulsed field ablation, ablation of posterior wall with pulsed field ablation with no early apparent complications.   He was last seen in clinic 12/08/2019 with continued discomfort to his bilateral lower extremities.  Stated he had been compliant with his apixaban.  Denies any chest pain or shortness of breath.  Occasional swelling to his bilateral lower extremities right hand numbness but states that would be improved after surgery.  He also noticed slight weight gain over Walter past week.  He had previously been scheduled for Walter Salas that was canceled by Walter patient and his surgery was canceled at Walter Salas due to recent A-fib ablation.  On discussion about that today he stated that he would have further testing completed at Walter Salas.  He was scheduled for a follow-up CT of Walter chest ascending aorta as well as a follow-up with Walter Salas for a vascular follow-up.  He  was seen by Walter. Kirke Salas in 03/04/2023 for evaluation of peripheral arterial disease.  With his known history of coronary disease status post PCI.  It was believed that some of his symptoms were pseudoclaudication related to his back situation.  He reports his symptoms are worse on Walter right than Walter left but his peripheral  arterial disease was currently worse on Walter left by exam.  It was recommended that he would benefit from vascular rehab once he recovers from upcoming back surgery.  His ascending aortic aneurysm was stable in size of 4.3 cm and he can continue with annual surveillance studies.  He returns to clinic today  ROS: 10 point review of systems has been reviewed and considered negative with exception of what is been listed in Walter HPI  Studies Reviewed: Marland Kitchen        Arterial Duplex 12/03/22 Summary:  Right: 30-49% stenosis noted in Walter deep femoral artery. 30-49% stenosis  noted in Walter superficial femoral artery. Total occlusion noted in Walter  peroneal artery.   Left: 30-49% stenosis noted in Walter deep femoral artery. 75-99% stenosis  noted in Walter superficial femoral artery. Total occlusion noted in Walter  peroneal artery.   Diffuse plaque seen in all scanned vessels.   Suggest Peripheral Vascular Consult.    ABI's 11/24/22 Summary:  Right: Resting right ankle-brachial index indicates mild right lower  extremity arterial disease. Walter right toe-brachial index is abnormal.   Left: Resting left ankle-brachial index indicates mild left lower  extremity arterial disease. Walter left toe-brachial index is abnormal.  *See table(s) above for measurements and observations.    Suggest follow up study in 12 months.    A-Fib Ablation 10/06/22 CONCLUSIONS: 1. Sinus rhythm upon presentation.   2. Successful electrical isolation and anatomical encircling of all four pulmonary veins with pulse field ablation. 3. Ablation of posterior wall with pulse field ablation 4. No early apparent complications.   Cardiac CT 09/29/2022 IMPRESSION: 1. There is normal pulmonary vein drainage into Walter left atrium.   2. Walter left atrial appendage is a chicken wing type with ostial size 32 x 25 mm and length 37 mm, Area 53 mm2. There is no thrombus in Walter left atrial appendage.   3. Walter esophagus runs in Walter left atrial  midline and is not in Walter proximity to any of Walter pulmonary veins.   TTE 09/11/22 1. Left ventricular ejection fraction, by estimation, is 60 to 65%. Walter  left ventricle has normal function. Walter left ventricle has no regional  wall motion abnormalities. There is mild left ventricular hypertrophy.  Left ventricular diastolic parameters  are consistent with Grade II diastolic dysfunction (pseudonormalization).   2. Right ventricular systolic function is normal. Walter right ventricular  size is normal. Tricuspid regurgitation signal is inadequate for assessing  PA pressure.   3. Left atrial size was moderately dilated.   4. A small pericardial effusion is present estimated 1.29 cm off Walter LV  free wall. Effusion appears less off RV. There is no evidence of cardiac  tamponade.   5. Walter mitral valve is normal in structure. No evidence of mitral valve  regurgitation. No evidence of mitral stenosis.   6. Walter aortic valve appears tricuspid though not well visualized. Aortic  valve regurgitation is not visualized. Aortic valve sclerosis is present,  with no evidence of aortic valve stenosis. Aortic valve mean gradient  measures 5.0 mmHg.   7. There is mild dilatation of Walter aortic root, measuring 43 mm. There is  mild dilatation of Walter ascending aorta, measuring 42 mm.   8. Walter inferior vena cava is normal in size with greater than 50%  respiratory variability, suggesting right atrial pressure of 3 mmHg.    TTE 02/17/22  Review of Walter above records today demonstrates:   1. Left ventricular ejection fraction, by estimation, is 55 to 60%. Walter  left ventricle has normal function. Walter left ventricle has no regional  wall motion abnormalities. There is mild asymmetric left ventricular  hypertrophy of Walter basal-septal segment.  Left ventricular diastolic parameters are consistent with Grade II  diastolic dysfunction (pseudonormalization).   2. Right ventricular systolic function is normal. Walter right  ventricular  size is normal. Tricuspid regurgitation signal is inadequate for assessing  PA pressure.   3. Left atrial size was mildly dilated.   4. Walter mitral valve is normal in structure. No evidence of mitral valve  regurgitation. No evidence of mitral stenosis.   5. Walter aortic valve is normal in structure. Aortic valve regurgitation is  not visualized. No aortic stenosis is present.   6. There is borderline dilatation of Walter aortic root, measuring 39 mm.  There is mild dilatation of Walter ascending aorta, measuring 40 mm.   7. Walter inferior vena cava is normal in size with greater than 50%  respiratory variability, suggesting right atrial pressure of 3 mmHg  Risk Assessment/Calculations:    CHA2DS2-VASc Score = 7  {Confirm score is correct.  If not, click here to update score.  REFRESH note.  :1} This indicates a 11.2% annual risk of stroke. Walter patient's score is based upon: CHF History: 1 HTN History: 1 Diabetes History: 1 Stroke History: 2 Vascular Disease History: 1 Age Score: 1 Gender Score: 0   {This patient has a significant risk of stroke if diagnosed with atrial fibrillation.  Please consider VKA or DOAC agent for anticoagulation if Walter bleeding risk is acceptable.   You can also use Walter SmartPhrase .[email or web address] for documentation.   :[id number]}     STOP-Bang Score:  4  { Consider Dx Sleep Disordered Breathing or Sleep Apnea  ICD G47.33          :1}    Physical Exam:   VS:  There were no vitals taken for this visit.   Wt Readings from Last 3 Encounters:  03/04/23 211 lb 6 oz (95.9 kg)  01/22/23 207 lb (93.9 kg)  01/21/23 202 lb (91.6 kg)    GEN: Well nourished, well developed in no acute distress NECK: No JVD; No carotid bruits CARDIAC: ***RRR, no murmurs, rubs, gallops RESPIRATORY:  Clear to auscultation without rales, wheezing or rhonchi  ABDOMEN: Soft, non-tender, non-distended EXTREMITIES:  No edema; No deformity   ASSESSMENT AND PLAN: .   ***    {Are you  ordering a CV Procedure (e.g. stress test, cath, DCCV, TEE, etc)?   Press F2        :[id number]}  Dispo: ***  Signed, Yasmine Kilbourne, NP

## 2023-03-08 ENCOUNTER — Ambulatory Visit: Payer: Medicare Other | Attending: Cardiology | Admitting: Cardiology

## 2023-03-16 DIAGNOSIS — E1142 Type 2 diabetes mellitus with diabetic polyneuropathy: Secondary | ICD-10-CM | POA: Diagnosis not present

## 2023-03-16 DIAGNOSIS — E569 Vitamin deficiency, unspecified: Secondary | ICD-10-CM | POA: Diagnosis not present

## 2023-03-16 DIAGNOSIS — Z79899 Other long term (current) drug therapy: Secondary | ICD-10-CM | POA: Diagnosis not present

## 2023-03-17 ENCOUNTER — Encounter: Payer: Medicare Other | Admitting: Family Medicine

## 2023-03-22 ENCOUNTER — Encounter: Payer: Self-pay | Admitting: Internal Medicine

## 2023-03-24 ENCOUNTER — Other Ambulatory Visit: Payer: Self-pay | Admitting: Family Medicine

## 2023-03-24 DIAGNOSIS — G8929 Other chronic pain: Secondary | ICD-10-CM

## 2023-03-24 DIAGNOSIS — M5412 Radiculopathy, cervical region: Secondary | ICD-10-CM

## 2023-03-24 MED ORDER — TRAMADOL HCL 50 MG PO TABS: 50.0000 mg | ORAL_TABLET | Freq: Three times a day (TID) | ORAL | 0 refills | Status: DC | PRN

## 2023-03-24 MED ORDER — TRAMADOL HCL 50 MG PO TABS
ORAL_TABLET | ORAL | 1 refills | Status: DC
Start: 2023-03-24 — End: 2023-04-07

## 2023-03-24 MED ORDER — TRAMADOL HCL 50 MG PO TABS: 100.0000 mg | ORAL_TABLET | Freq: Three times a day (TID) | ORAL | 0 refills | Status: DC | PRN

## 2023-03-24 NOTE — Assessment & Plan Note (Signed)
 Tramadol refill for one month in Dr Purvis Sheffield absence

## 2023-04-07 ENCOUNTER — Encounter: Payer: Self-pay | Admitting: Nurse Practitioner

## 2023-04-07 ENCOUNTER — Ambulatory Visit: Payer: Medicare Other | Attending: Nurse Practitioner | Admitting: Nurse Practitioner

## 2023-04-07 VITALS — BP 136/60 | HR 80 | Ht 70.0 in | Wt 210.6 lb

## 2023-04-07 DIAGNOSIS — I7121 Aneurysm of the ascending aorta, without rupture: Secondary | ICD-10-CM

## 2023-04-07 DIAGNOSIS — I739 Peripheral vascular disease, unspecified: Secondary | ICD-10-CM | POA: Diagnosis not present

## 2023-04-07 DIAGNOSIS — I5032 Chronic diastolic (congestive) heart failure: Secondary | ICD-10-CM

## 2023-04-07 DIAGNOSIS — I4819 Other persistent atrial fibrillation: Secondary | ICD-10-CM | POA: Diagnosis not present

## 2023-04-07 DIAGNOSIS — I2510 Atherosclerotic heart disease of native coronary artery without angina pectoris: Secondary | ICD-10-CM | POA: Diagnosis not present

## 2023-04-07 DIAGNOSIS — E785 Hyperlipidemia, unspecified: Secondary | ICD-10-CM | POA: Diagnosis not present

## 2023-04-07 DIAGNOSIS — I10 Essential (primary) hypertension: Secondary | ICD-10-CM | POA: Diagnosis not present

## 2023-04-07 NOTE — Patient Instructions (Signed)
 Medication Instructions:  Your physician recommends that you continue on your current medications as directed. Please refer to the Current Medication list given to you today.  *If you need a refill on your cardiac medications before your next appointment, please call your pharmacy*   Lab Work: No labs ordered today  If you have labs (blood work) drawn today and your tests are completely normal, you will receive your results only by: MyChart Message (if you have MyChart) OR A paper copy in the mail If you have any lab test that is abnormal or we need to change your treatment, we will call you to review the results.   Testing/Procedures: No test ordered today    Follow-Up: At [facility], you and your health needs are our priority.  As part of our continuing mission to provide you with exceptional heart care, we have created designated Provider Care Teams.  These Care Teams include your primary Cardiologist (physician) and Advanced Practice Providers (APPs -  Physician Assistants and Nurse Practitioners) who all work together to provide you with the care you need, when you need it.  We recommend signing up for the patient portal called "MyChart".  Sign up information is provided on this After Visit Summary.  MyChart is used to connect with patients for Virtual Visits (Telemedicine).  Patients are able to view lab/test results, encounter notes, upcoming appointments, etc.  Non-urgent messages can be sent to your provider as well.   To learn more about what you can do with MyChart, go to [email or web address].    Your next appointment:    3 month follow up with Dr Keturah Barre   Provider:   You may see Dr Kirke Corin  or one of the following Advanced Practice Providers on your designated Care Team:   Nicolasa Ducking, NP Eula Listen, PA-C Cadence Fransico Michael, PA-C Charlsie Quest, NP Carlos Levering, NP

## 2023-04-07 NOTE — Progress Notes (Signed)
 Office Visit    Patient Name: Walter Salas Date of Encounter: 04/07/2023  Primary Care Provider:  Glori Luis, MD (Inactive) Primary Cardiologist:  Yvonne Kendall, MD  Chief Complaint    68 y.o. male with a history of CAD, persistent atrial fibrillation status post catheter ablation in 2024, hypertension, hyperlipidemia, diabetes, chronic HFpEF, stroke, ascending thoracic aortic aneurysm, peripheral arterial disease, history of fall with subdural hematoma, chronic back pain with multiple back surgeries, who presents for follow-up related to CAD.  Past Medical History  Subjective   Past Medical History:  Diagnosis Date   (HFpEF) heart failure with preserved ejection fraction (HCC)    a. 2018 Echo: EF 60-65%, Gr1DD; b. 12/2017 Echo: EF 60-65%; c. 02/2021 Echo: EF 60-65%; c. 02/2022 Echo: EF 55-60%, GrII DD, no rwma, GrII DD, nl RV size/fxn, mildly dil LA.   Agent orange exposure    Aortic atherosclerosis (HCC)    Arthritis    B12 deficiency    Back pain    Bacteremia    Benign neoplasm of adrenal gland    CAD (coronary artery disease)    a. s/p PCI/DES to the LCx and RCA in 2014; b. 05/2016 MV: EF 55-65%. Small, mild defect in basal anterolateral location->infarct w/o ischemia.   Carpal tunnel syndrome    Causalgia of lower limb    Cervical disc disorder with radiculopathy    Cervical radiculopathy    Chronic cough    Chronic pain    Colon polyp    Degeneration of lumbar or lumbosacral intervertebral disc    Degenerative joint disease of right acromioclavicular joint    Depression    Diabetes mellitus without complication (HCC)    Diastasis recti    Diverticulosis    GERD (gastroesophageal reflux disease)    H. pylori infection    Hereditary and idiopathic peripheral neuropathy    History of CVA (cerebrovascular accident)    History of kidney stones    History of stomach ulcers    History of subdural hematoma 12/14/2012   HOH (hard of hearing)    Hx of  laminectomy 1999   Hyperlipidemia    Hypertension    Lacunar infarct, acute (HCC)    Myocardial infarction (HCC)    Onychomycosis    PAD (peripheral artery disease) (HCC)    a. 11/2022 ABIs/Duplex: Right ABI 0.89 - 30-49% deep FA, 30-49% SFA, 100 peroneal // Left ABI 0.82 - 30-49% deep FA, 75-99% SFA, 100 peroneal.   Persistent atrial fibrillation (HCC)    a. 05/2022 s/p DCCV; b. 09/2022 s/p Afib ablation.   Skin cancer    Sleep apnea    Stroke (HCC)    Subdural hematoma (HCC) 12/14/2012   Syncope    a. Event monitor 2018: NSR with first-degree AV block, no significant arrhythmias or pauses; b.  Myoview showed a small in size, mild in severity fixed basal and anterior lateral defect, no ischemia, LVEF 55 to 65%, low risk study   Thoracic aortic aneurysm (HCC)    a. CTA aorta 6/18: Asc Ao aneurysmal dil w max diam of 4.3 cm; b. 12/2019 CTA Chest: stable 4.3cm thor Ao Aneurysm; c. 12/2022 CTA Chest: 4.3 cm asc thoracic Ao aneurysm.   Thyroid nodule    Tremor    Ulcer    Vitamin D deficiency    Past Surgical History:  Procedure Laterality Date   ATRIAL FIBRILLATION ABLATION N/A 10/06/2022   Procedure: ATRIAL FIBRILLATION ABLATION;  Surgeon: Regan Lemming, MD;  Location: [facility] INVASIVE CV LAB;  Service: Cardiovascular;  Laterality: N/A;   BACK SURGERY  1993, 1994, 1997, 1998   BIOPSY  01/21/2023   Procedure: BIOPSY;  Surgeon: Jaynie Collins, DO;  Location: [facility] ENDOSCOPY;  Service: Gastroenterology;;   BLEPHAROPLASTY     CARDIOVERSION N/A 06/05/2022   Procedure: CARDIOVERSION;  Surgeon: Jodelle Red, MD;  Location: [facility] INVASIVE CV LAB;  Service: Cardiovascular;  Laterality: N/A;   CAROTID STENT     CARPAL TUNNEL RELEASE Bilateral 1990   CATARACT EXTRACTION W/PHACO Left 03/08/2018   Procedure: CATARACT EXTRACTION PHACO AND INTRAOCULAR LENS PLACEMENT (IOC) LEFT;  Surgeon: Galen Manila, MD;  Location: [facility] ORS;  Service: Ophthalmology;  Laterality: Left;  [place]   00:24.2 CDE 3.25 Fluid pack lot # [id number]   CATARACT EXTRACTION W/PHACO Right 05/24/2018   Procedure: CATARACT EXTRACTION PHACO AND INTRAOCULAR LENS PLACEMENT (IOC) RIGHT;  Surgeon: Galen Manila, MD;  Location[facility];  Service: Ophthalmology;  Laterality: Right;  Diabetic - oral meds   CERVICAL FUSION  2000. 2003   CHOLECYSTECTOMY  2000   COLONOSCOPY N/A 01/21/2023   Procedure: COLONOSCOPY;  Surgeon: Jaynie Collins, DO;  Location: [facility] ENDOSCOPY;  Service: Gastroenterology;  Laterality: N/A;   COLONOSCOPY WITH PROPOFOL N/A 02/23/2017   Procedure: COLONOSCOPY WITH PROPOFOL;  Surgeon: Christena Deem, MD;  Location: [facility] ENDOSCOPY;  Service: Endoscopy;  Laterality: N/A;   CORONARY ANGIOPLASTY WITH STENT PLACEMENT  2014   [facility]   ESOPHAGOGASTRODUODENOSCOPY (EGD) WITH PROPOFOL N/A 02/23/2017   Procedure: ESOPHAGOGASTRODUODENOSCOPY (EGD) WITH PROPOFOL;  Surgeon: Christena Deem, MD;  Location: [facility] ENDOSCOPY;  Service: Endoscopy;  Laterality: N/A;   ESOPHAGOGASTRODUODENOSCOPY (EGD) WITH PROPOFOL N/A 01/21/2023   Procedure: ESOPHAGOGASTRODUODENOSCOPY (EGD) WITH PROPOFOL;  Surgeon: Jaynie Collins, DO;  Location: [facility] ENDOSCOPY;  Service: Gastroenterology;  Laterality: N/A;   EYE SURGERY     POLYPECTOMY  01/21/2023   Procedure: POLYPECTOMY;  Surgeon: Jaynie Collins, DO;  Location: [facility] ENDOSCOPY;  Service: Gastroenterology;;   PULSE GENERATOR IMPLANT N/A 03/04/2020   Procedure: PULSE GENERATOR PLACEMENT;  Surgeon: Lucy Chris, MD;  Location: [facility] ORS;  Service: Neurosurgery;  Laterality: N/A;  1ST CASE   SHOULDER ARTHROSCOPY Right 2012   THORACIC LAMINECTOMY FOR SPINAL CORD STIMULATOR N/A 02/26/2020   Procedure: THORACIC LAMINECTOMY FOR SPINAL CORD STIMULATOR PADDLE TRIAL;  Surgeon: Lucy Chris, MD;  Location: [facility] ORS;  Service: Neurosurgery;  Laterality: N/A;  1ST CASE   TUMOR EXCISION  2007     Allergies  Allergies  Allergen Reactions   Gabapentin Other (See Comments)    "makes me crazy" MENTAL STATUS CHANGES    Isosorbide Mononitrate Er [Isosorbide Dinitrate] Other (See Comments)    Headache   Lisinopril Cough   Tylenol [Acetaminophen] Other (See Comments)    GI upset      History of Present Illness      68 y.o. y/o male with a history of CAD, persistent atrial fibrillation status post catheter ablation in 2024, hypertension, hyperlipidemia, diabetes, chronic HFpEF, stroke, ascending thoracic aortic aneurysm, peripheral arterial disease, history of fall with subdural hematoma, chronic back pain with multiple back surgeries.  He previously underwent PCI and drug-eluting stent placement to the left circumflex and right coronary artery in 2014 at the [facility].  He established care with Dr. Okey Dupre in 2018 following several syncopal episodes.  Stress testing was performed and was low risk with a small defect of mild severity and suggestive of infarct  without ischemia.  Echo at that time showed normal LV function with grade 1 diastolic dysfunction as well as a mildly dilated ascending aorta 42 mm.  Subsequent 30-day event monitoring showed predominantly sinus rhythm without significant arrhythmias or pauses.  In April 2023, he was admitted with lacunar infarct.  CT of the head and neck showed mild to moderate atherosclerosis without large vessel occlusion.  He was admitted to [facility] March 2024 with new onset atrial fibrillation and volume overload.  Echo showed normal LV function with grade 1 diastolic dysfunction.  He was placed on Eliquis and was subsequently transferred to the [facility] to and underwent cardioversion, return of A-fib and required repeat cardioversion in May 2024.  Decision was made to pursue A-fib ablation.  Repeat echo in August 2024 showed normal LV function with grade 2 diastolic dysfunction.  He underwent successful PVI in September 2024,  and has since been maintained on amiodarone.   Walter Salas has chronic back pain and in November 2024, reported bilateral lower extremity pain and claudication as well.  He underwent ABIs which were abnormal at 0.89 on the right and 0.82 on the left with suggestion of bilateral femoral artery and was peroneal disease.  He was seen by Dr. Kirke Corin in February 2025, and it was felt that symptoms were likely due to pseudoclaudication in the setting of severe disc disease and back pain, and therefore conservative management with eventual vascular rehab was recommended, pending recovery from pending neck surgery, for which he was cleared by cardiology @ the [place].  Walter Salas is scheduled for next week.  From a cardiac standpoint, he has been doing well.  Activity is limited by chronic knee and thigh pain, which is present throughout the day at rest and worsens with activity, similar to what was described previously.  This has been there for many years.  He does not experience chest pain or dyspnea and denies palpitations, PND, orthopnea, dizziness, syncope, edema, or early satiety. Objective  Home Medications    Current Outpatient Medications  Medication Sig Dispense Refill   amiodarone (PACERONE) 200 MG tablet Take 1 tablet (200 mg total) by mouth daily. 90 tablet 3   amLODipine (NORVASC) 2.5 MG tablet Take 1 tablet (2.5 mg total) by mouth daily. TAKE ONE TABLET (2.5 MG) BY MOUTH EVERY DAY 90 tablet 3   apixaban (ELIQUIS) 5 MG TABS tablet Take 5 mg by mouth 2 (two) times daily.     atorvastatin (LIPITOR) 80 MG tablet Take 80 mg by mouth daily.     carbidopa-levodopa (SINEMET IR) 25-100 MG tablet Take 1 tablet by mouth 3 (three) times daily.     chlorthalidone (HYGROTON) 50 MG tablet Take 50 mg by mouth daily.     cholecalciferol (VITAMIN D3) 25 MCG (1000 UNIT) tablet Take 4,000 Units by mouth daily.     cyclobenzaprine (FLEXERIL) 10 MG tablet Take 10 mg by mouth 3 (three) times daily as needed for muscle  spasms.     DULoxetine (CYMBALTA) 20 MG capsule Take 20 mg by mouth daily.     ezetimibe (ZETIA) 10 MG tablet TAKE 1 TABLET BY MOUTH DAILY. 90 tablet 3   fenofibrate (TRICOR) 145 MG tablet Take 1 tablet (145 mg total) by mouth daily. 30 tablet 0   furosemide (LASIX) 20 MG tablet TAKE 1 TABLET BY MOUTH DAILY AS NEEDED (FOR WEIGHT GAIN >3 LBS OVER NIGHT OR >5 LBS/WEEK) 30 tablet 4   glucose blood test strip Check once daily,  E11.9 100 each 12   icosapent Ethyl (VASCEPA) 1 g capsule TAKE 2 CAPSULES (2 GRAMS TOTAL) BY MOUTHTWICE DAILY 360 capsule 0   losartan (COZAAR) 25 MG tablet Take 25 mg by mouth daily.     metFORMIN (GLUCOPHAGE) 1000 MG tablet Take 1,000 mg by mouth 2 (two) times daily with a meal.     methyl salicylate liquid Apply 1 application topically as needed for muscle pain.     nitroGLYCERIN (NITROSTAT) 0.4 MG SL tablet Place 1 tablet (0.4 mg total) under the tongue every 5 (five) minutes as needed for chest pain. May take up to 3 doses. 30 tablet 0   omeprazole (PRILOSEC) 20 MG capsule Take 20 mg by mouth daily.     ondansetron (ZOFRAN) 4 MG tablet TAKE 1 TABLET BY MOUTH EVERY 8 HOURS AS NEEDED. 20 tablet 0   polyvinyl alcohol (LIQUIFILM TEARS) 1.4 % ophthalmic solution Place 1 drop into both eyes as needed for dry eyes.      potassium chloride SA (K-DUR,KLOR-CON) 20 MEQ tablet Take 1 tablet (20 mEq total) by mouth daily. 90 tablet 3   pregabalin (LYRICA) 75 MG capsule Take 75 mg by mouth 3 (three) times daily.     tamsulosin (FLOMAX) 0.4 MG CAPS capsule TAKE 1 CAPSULE BY MOUTH ONCE DAILY 30 capsule 3   vitamin B-12 (CYANOCOBALAMIN) 1000 MCG tablet Take 1 tablet (1,000 mcg total) by mouth daily. 30 tablet 0   aspirin 81 MG EC tablet Take 81 mg by mouth daily. (Patient not taking: Reported on 04/07/2023)     No current facility-administered medications for this visit.     Physical Exam    VS:  BP 136/60   Pulse 80   Ht 5\' 10"  (1.778 m)   Wt 210 lb 9.6 oz (95.5 kg)   SpO2 97%    BMI 30.22 kg/m  , BMI Body mass index is 30.22 kg/m. STOP-Bang Score:  4        GEN: Well nourished, well developed, in no acute distress. HEENT: normal. Neck: Supple, no JVD, carotid bruits, or masses. Cardiac: RRR, 1/6 systolic murmur throughout.  No rubs or gallops. No clubbing, cyanosis, edema.  Radials 2+/PT nonpalpable bilaterally. Respiratory:  Respirations regular and unlabored, clear to auscultation bilaterally. GI: Soft, nontender, nondistended, BS + x 4. MS: no deformity or atrophy. Skin: warm and dry, no rash. Neuro:  Strength and sensation are intact. Psych: Normal affect.  Accessory Clinical Findings     Lab Results  Component Value Date   WBC 5.9 11/03/2022   HGB 14.3 12/17/2022   HCT 42.0 12/17/2022   MCV 85 11/03/2022   PLT 197 11/03/2022   Lab Results  Component Value Date   CREATININE 1.30 (H) 12/17/2022   BUN 22 12/17/2022   NA 138 12/17/2022   K 4.2 12/17/2022   CL 96 (L) 12/17/2022   CO2 23 11/03/2022   Lab Results  Component Value Date   ALT 32 11/03/2022   AST 25 11/03/2022   ALKPHOS 87 11/03/2022   BILITOT 0.4 11/03/2022   Lab Results  Component Value Date   CHOL 117 12/16/2022   HDL 36.20 (L) 12/16/2022   LDLCALC 47 12/16/2022   LDLDIRECT 90.0 06/30/2021   TRIG 170.0 (H) 12/16/2022   CHOLHDL 3 12/16/2022    Lab Results  Component Value Date   HGBA1C 6.8 (H) 12/16/2022   Lab Results  Component Value Date   TSH 1.450 11/03/2022       Assessment &  Plan    1.  Coronary artery disease: Status post PCI drug-eluting stent placement to the left circumflex and right coronary artery in 2014.  He had a low risk Myoview in May 2018, and has since been medically managed.  He denies chest pain or dyspnea.  Activity is somewhat limited by chronic leg and knee pain.  He remains on aspirin, statin, Zetia, Vascepa therapy.  In the setting of planned spinal surgery, he will begin holding his aspirin later this week.  He is advised to continue  statin and amiodarone throughout the perioperative period.  2.  Persistent atrial fibrillation: Status post cardioversion in May 2024 with subsequent catheter ablation in September 2024.  Doing well without known recurrence of A-fib.  He remains on amiodarone 200 mg daily and is anticoagulated with Eliquis.  He will begin holding Eliquis early next week at the discretion of the anesthesia team at the [place].  He will continue amiodarone throughout the perioperative period.  EP to consider discontinuation of amiodarone in the future if no recurrence of atrial fibrillation.  3.  Hypertension: Blood pressure is stable on amlodipine, chlorthalidone, and losartan therapy.  4.  Hyperlipidemia/hypertriglyceridemia: LDL of 47 with triglycerides of 170 in September 2024.  He is on atorvastatin, fenofibrate, and Vascepa.  LFTs were normal in October 2024.  5.  Type 2 diabetes mellitus: A1c of 6.8 in December 2024.  He is followed by primary care and is on metformin therapy.  6.  Chronic HFpEF: EF 60 to 65% with grade 2 diastolic dysfunction by echo in August 2024.  Euvolemic on examination with stable heart rate and blood pressure.  7.  Ascending thoracic aortic aneurysm: Stable 4.3 cm ascending thoracic aortic aneurysm by CTA in December 2024 with plan for follow-up in December 2025.  8.  Peripheral arterial disease: Patient with chronic thigh and knee pain at rest that worsens with activity.  Abnormal ABIs late last year with 0.89 on the right and 0.82 on the left and Doppler study suggestive of moderate femoral arterial disease and obstructive peroneal disease.  Seen by Dr. Kirke Corin with recommendation for conservative management and vascular rehab following pending surgery.  Symptoms felt to be largely secondary to history of back issues.  9.  Disposition: Patient initially scheduled for PV follow-up with Dr. Kirke Corin next week.  At his request, we will push this back to 3 months.  Nicolasa Ducking,  NP 04/07/2023, 10:35 AM

## 2023-04-22 ENCOUNTER — Ambulatory Visit: Payer: Medicare Other | Admitting: Cardiology

## 2023-05-04 ENCOUNTER — Ambulatory Visit (INDEPENDENT_AMBULATORY_CARE_PROVIDER_SITE_OTHER)

## 2023-05-04 VITALS — BP 140/64 | HR 63 | Temp 97.9°F | Ht 70.0 in | Wt 203.0 lb

## 2023-05-04 DIAGNOSIS — I10 Essential (primary) hypertension: Secondary | ICD-10-CM | POA: Diagnosis not present

## 2023-05-04 DIAGNOSIS — E785 Hyperlipidemia, unspecified: Secondary | ICD-10-CM

## 2023-05-04 DIAGNOSIS — G8929 Other chronic pain: Secondary | ICD-10-CM | POA: Diagnosis not present

## 2023-05-04 DIAGNOSIS — G609 Hereditary and idiopathic neuropathy, unspecified: Secondary | ICD-10-CM

## 2023-05-04 DIAGNOSIS — E119 Type 2 diabetes mellitus without complications: Secondary | ICD-10-CM | POA: Diagnosis not present

## 2023-05-04 DIAGNOSIS — E1169 Type 2 diabetes mellitus with other specified complication: Secondary | ICD-10-CM

## 2023-05-04 DIAGNOSIS — M545 Low back pain, unspecified: Secondary | ICD-10-CM | POA: Diagnosis not present

## 2023-05-04 DIAGNOSIS — G4733 Obstructive sleep apnea (adult) (pediatric): Secondary | ICD-10-CM | POA: Diagnosis not present

## 2023-05-04 DIAGNOSIS — M5412 Radiculopathy, cervical region: Secondary | ICD-10-CM

## 2023-05-04 DIAGNOSIS — G20A1 Parkinson's disease without dyskinesia, without mention of fluctuations: Secondary | ICD-10-CM | POA: Diagnosis not present

## 2023-05-04 DIAGNOSIS — Z7984 Long term (current) use of oral hypoglycemic drugs: Secondary | ICD-10-CM

## 2023-05-04 DIAGNOSIS — Z720 Tobacco use: Secondary | ICD-10-CM | POA: Diagnosis not present

## 2023-05-04 DIAGNOSIS — I4891 Unspecified atrial fibrillation: Secondary | ICD-10-CM | POA: Insufficient documentation

## 2023-05-04 LAB — LIPID PANEL
Cholesterol: 238 mg/dL — ABNORMAL HIGH (ref 0–200)
HDL: 45.8 mg/dL (ref >39.00)
LDL Cholesterol: 131 mg/dL — ABNORMAL HIGH (ref 0–99)
NonHDL: 192.23
Total CHOL/HDL Ratio: 5
Triglycerides: 306 mg/dL — ABNORMAL HIGH (ref 0.0–149.0)
VLDL: 61.2 mg/dL — ABNORMAL HIGH (ref 0.0–40.0)

## 2023-05-04 LAB — HEMOGLOBIN A1C: Hgb A1c MFr Bld: 7.5 % — ABNORMAL HIGH (ref 4.6–6.5)

## 2023-05-04 MED ORDER — TAMSULOSIN HCL 0.4 MG PO CAPS
0.4000 mg | ORAL_CAPSULE | Freq: Every day | ORAL | 1 refills | Status: DC
Start: 2023-05-04 — End: 2023-10-18

## 2023-05-04 MED ORDER — ICOSAPENT ETHYL 1 G PO CAPS: 2.0000 g | ORAL_CAPSULE | Freq: Every day | ORAL | 0 refills | Status: DC

## 2023-05-04 NOTE — Assessment & Plan Note (Signed)
 Chronic issue.  Check lipid panel today, patient is fasting.  On Atorvastatin  80 mg, Zetia  10 mg, Vascepa  2 gm and Fenofibrate  45 mg daily.  Requesting refill for Vascepa , refill sent. Rest of the prescription patient gets through his [place].

## 2023-05-04 NOTE — Assessment & Plan Note (Signed)
 Chronic issue.  Goal BP <130/80 mmHg, SBP not within goal. Patient is checking BP at home which he reports is <130/80 mmHg. If at home BP is >130/80 mmHg recommend f/u to adjust medication. Continue current medications: losartan  25 mg daily, chlorthalidone  50 mg daily, and amlodipine  2.5 mg daily.

## 2023-05-04 NOTE — Assessment & Plan Note (Signed)
 Not on CPAP, reports he did not tolerate it and is not interested in going back to using CPAP. Reports inspire device was not covered by his insurance. Counseling on benefit for treatment of OSA provided.

## 2023-05-04 NOTE — Assessment & Plan Note (Signed)
 Chronic issue.  Check A1c today. Continue metformin 1000 mg twice daily.

## 2023-05-04 NOTE — Assessment & Plan Note (Signed)
 S/P C3-C7 PCDF at [facility] on 04/2023. On Tramadol  50 mg BID. I counseled patient to reach out to his surgeon to get refill at this time since patient is not due for refill till 04/17/23 per PDMP review. If patient not able to get refill through his surgeon I counseled the patient to reach out to our office before he runs out of prescription. I also counseled the patient on risk of withdrawals and slow tapering in the future if needed. Patient and his step daughter verbalized understanding and will reach out to [facility] surgeon first and to our clinic before he runs out of medication for refill from us  if needed.

## 2023-05-04 NOTE — Assessment & Plan Note (Signed)
 Seeing neurology, stable, on Lyrica  which he gets refill through his neurologist.

## 2023-05-04 NOTE — Assessment & Plan Note (Signed)
 Newly diagnosed. Sees [facility] neurology, on Carbidopa-Levidopa 20/100 mg, three times a day. Continue f/u and management per neurology.

## 2023-05-04 NOTE — Progress Notes (Signed)
 Established Patient Office Visit   Subjective  Patient ID: Walter Salas, male    DOB: December 18, 1955  Age: 68 y.o. MRN: [id number]  Chief Complaint  Patient presents with   Transitions Of Care    He  has a past medical history of (HFpEF) heart failure with preserved ejection fraction (HCC), Agent orange exposure, Aortic atherosclerosis (HCC), Arthritis, B12 deficiency, Back pain, Bacteremia, Benign neoplasm of adrenal gland, CAD (coronary artery disease), Carpal tunnel syndrome, Causalgia of lower limb, Cervical disc disorder with radiculopathy, Cervical radiculopathy, Chronic cough, Chronic pain, Colon polyp, Degeneration of lumbar or lumbosacral intervertebral disc, Degenerative joint disease of right acromioclavicular joint, Depression, Diabetes mellitus without complication (HCC), Diastasis recti, Diverticulosis, GERD (gastroesophageal reflux disease), H. pylori infection, Hereditary and idiopathic peripheral neuropathy, History of CVA (cerebrovascular accident), History of kidney stones, History of stomach ulcers, History of subdural hematoma (12/14/2012), HOH (hard of hearing), laminectomy (1999), Hyperlipidemia, Hypertension, Lacunar infarct, acute (HCC), Myocardial infarction (HCC), Onychomycosis, PAD (peripheral artery disease) (HCC), Persistent atrial fibrillation (HCC), Skin cancer, Sleep apnea, Stroke (HCC), Subdural hematoma (HCC) (12/14/2012), Syncope, Thoracic aortic aneurysm (HCC), Thyroid  nodule, Tremor, Ulcer, and Vitamin D  deficiency.  HPI Established patient of Dr. Lovetta Rucks presenting for transfer of care. His last appointment with Dr. Lovetta Rucks was on 12/16/2022.   - S/P C3-C7 PCDF at [facility]. He has a follow up appointment with his surgeon in May.  - Refill request on Tramadol :  Patient reports he has had multiple back surgeries and had bene on Tramadol  for a few years now. Patient reports he used to get Tramadol  refill from Dr. Lovetta Rucks in the past. Per PDMP review his last  refill for Tramadol  was on 04/17/23, with 60 tablets for 30 days. Patient reports since his hospital discharge patient has been taking Tramadol  50 mg, every 4 hourly for pain. He is requesting refill. He also reports in his Tramadol  bottle it states patient take Tramadol  every 4 hourly as needed for pain. Prior to this refill for Tramadol  from Dr. Lovetta Rucks was done on 03/08/23 when he was sent 90 tables for 15 days. Patient also takes Flexeril 10 mg, TID prn, Duloxetine  20 mg at bedtime, Methocarbamol 500 mg q8 hourly prn all refills through [place].   - DM II with peripheral neuropathy: Currently on Lyrica  75 mg, three times a day. Gets it refill through his neurologist at [facility]. On Metformin 1,000 mg BID. Is due for diabetic eye exam but given recent surgery, other comorbodities patient has not have time to get diabetic eye exam. F/U with [facility] and plans on getting it updated later this year. Requesting accessible parking today.    - Cardiac h/o, sees cardiologist Dr. Veryl Gottron End once a year.  HTN: On losartan  25 mg daily, chlorthalidone  50 mg daily, and amlodipine  2.5 mg daily. Reports home BP has been <130/80 mmHg. No chest pain.   A. Fib: S/p ablation in 2024. On Apixaban 5 mg twice a day.   CAD, hyperlipidemia: S/P PCI, drug eluting stent to left circumflex and R coronary artery in 2014. On Aspirin  81 mg, Atorvastatin  80 mg, Zetia  10 mg, Vascepa  2 gm and Fenofibrate  45 mg daily. Needs refill on Vascepa  2 gm.  HFpEF: On Prn Lasix  20 mg. PAD  Ascending thoracic aortic aneurysm: Evident on CTA chest/aorta in 12/17/2022 measuring 4.3 cm in diameter. Patient reports he has been vaping nicotine and only smokes cigarettes seldom.    OSA: Not on CPAP, reports he did not tolerate it and  is not interested in going back to using CPAP. Reports inspire device was not covered by his insurance.    Parkinson's disease: Sees [facility] neurology, on Carbidopa -Levidopa 20/100 mg, three  times a day.   H/o left thalamic infract in 02/26/2022: On Eliquis, aspirin , statin  Vitamin D  insufficiency: Borderline low in May 2025. [facility], on daily vitamin D  supplement.  Vitamin B 12 low normal: On B12 supplement 1,000 mcg daily. [facility].  Smoking: Currently vapes nicotine everyday. Continues to smoke cigarettes occasionally. Has been smoking since he was 13, averaging about 1 pack per day.  Due for low dose lung CT screening.  Increased risk of fall: Patient lives by himself but has support from his step children.   BPH: On Tamsulosin  0.4 mg at bedtime, needs refill.  Worked for the [employer]. [employer]	[place]. 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 Smoking since he was 68 years of age and  average of 1 pack a day.  He does not drink alcohol .   ROS As per HPI    Objective:     BP (!) 140/64   Pulse 63   Temp 97.9 F (36.6 C) (Oral)   Ht 5\' 10"  (1.778 m)   Wt 203 lb (92.1 kg)   SpO2 97%   BMI 29.13 kg/m      05/04/2023    9:50 AM 12/16/2022    3:33 PM 06/03/2022    9:25 AM  Depression screen PHQ 2/9  Decreased Interest 3 0 0  Down, Depressed, Hopeless 0 0 0  PHQ - 2 Score 3 0 0  Altered sleeping 3 0 0  Tired, decreased energy 1 0 0  Change in appetite 1 0 0  Feeling bad or failure about yourself  0 0 0  Trouble concentrating 0 0 0  Moving slowly or fidgety/restless 0 0 0  Suicidal thoughts 0 0 0  PHQ-9 Score 8 0 0  Difficult doing work/chores Not difficult at all Not difficult at all Not difficult at all      05/04/2023    9:50 AM 12/16/2022    3:34 PM 06/03/2022    9:26 AM 03/03/2022    8:47 AM  GAD 7 : Generalized Anxiety Score  Nervous, Anxious, on Edge 1 0 0 0  Control/stop worrying 0 0 0 0  Worry too much - different things 1 0 0 0  Trouble relaxing 1 0 0 0  Restless 1 0 0 0  Easily annoyed or irritable 0 0 0 0  Afraid - awful might happen 0 0 0 0  Total GAD 7 Score 4 0 0 0  Anxiety Difficulty Not difficult at all Not  difficult at all Not difficult at all Not difficult at all    Physical Exam Constitutional:      Comments: Neck brace on place s/p neck surgery.  HENT:     Head: Normocephalic and atraumatic.     Mouth/Throat:     Mouth: Mucous membranes are moist.  Cardiovascular:     Rate and Rhythm: Normal rate.  Pulmonary:     Breath sounds: Normal breath sounds.  Abdominal:     General: Bowel sounds are normal.     Palpations: Abdomen is soft.  Musculoskeletal:     Cervical back: Normal range of motion and neck supple.     Right lower leg: No edema.     Left lower leg: No edema.  Skin:  General: Skin is warm.  Neurological:     Mental Status: He is alert and oriented to person, place, and time.  Psychiatric:        Mood and Affect: Mood normal.        No results found for any visits on 05/04/23.  The ASCVD Risk score (Arnett DK, et al., 2019) failed to calculate for the following reasons:   Risk score cannot be calculated because patient has a medical history suggesting prior/existing ASCVD    Assessment & Plan:  Essential hypertension Assessment & Plan: Chronic issue.  Goal BP <130/80 mmHg, SBP not within goal. Patient is checking BP at home which he reports is <130/80 mmHg. If at home BP is >130/80 mmHg recommend f/u to adjust medication. Continue current medications: losartan  25 mg daily, chlorthalidone  50 mg daily, and amlodipine  2.5 mg daily.     Hyperlipidemia associated with type 2 diabetes mellitus (HCC) Assessment & Plan: Chronic issue.  Check lipid panel today, patient is fasting.  On Atorvastatin  80 mg, Zetia  10 mg, Vascepa  2 gm and Fenofibrate  45 mg daily.  Requesting refill for Vascepa , refill sent. Rest of the prescription patient gets through his [place].   Orders: -     Lipid panel  Type 2 diabetes mellitus without complication, without long-term current use of insulin  (HCC) Assessment & Plan: Chronic issue.  Check A1c today. Continue metformin 1000 mg twice  daily.   Orders: -     Hemoglobin A1c  Hereditary and idiopathic peripheral neuropathy Assessment & Plan: Seeing neurology, stable, on Lyrica  which he gets refill through his neurologist.     Tobacco user  Chronic low back pain, unspecified back pain laterality, unspecified whether sciatica present  Parkinson's disease, unspecified whether dyskinesia present, unspecified whether manifestations fluctuate (HCC) Assessment & Plan: Newly diagnosed. Sees [facility] neurology, on Carbidopa-Levidopa 20/100 mg, three times a day. Continue f/u and management per neurology.    OSA (obstructive sleep apnea) Assessment & Plan: Not on CPAP, reports he did not tolerate it and is not interested in going back to using CPAP. Reports inspire device was not covered by his insurance. Counseling on benefit for treatment of OSA provided.    Cervical radiculopathy Assessment & Plan: S/P C3-C7 PCDF at [facility] on 04/2023. On Tramadol  50 mg BID. I counseled patient to reach out to his surgeon to get refill at this time since patient is not due for refill till 04/17/23 per PDMP review. If patient not able to get refill through his surgeon I counseled the patient to reach out to our office before he runs out of prescription. I also counseled the patient on risk of withdrawals and slow tapering in the future if needed. Patient and his step daughter verbalized understanding and will reach out to [facility] surgeon first and to our clinic before he runs out of medication for refill from us  if needed.   Other orders -     Icosapent  Ethyl; Take 2 capsules (2 g total) by mouth daily.  Dispense: 360 capsule; Refill: 0 -     Tamsulosin  HCl; Take 1 capsule (0.4 mg total) by mouth daily.  Dispense: 90 capsule; Refill: 1    Return in about 3 months (around 08/03/2023) for Chronic follow up .   Jacklin Mascot, MD

## 2023-05-05 MED ORDER — DAPAGLIFLOZIN PROPANEDIOL 5 MG PO TABS: 5.0000 mg | ORAL_TABLET | Freq: Every day | ORAL | 1 refills | Status: DC

## 2023-05-05 NOTE — Telephone Encounter (Signed)
 Noted. Thank you.  Jacklin Mascot, MD

## 2023-05-05 NOTE — Telephone Encounter (Signed)
 Can you please call the patient and go through the following mychart message I sent to him on 05/04/23. Doesn't look like he has seen the mychart message:    "Walter Salas,    I reviewed your lab results. Your HbA1c which shows 3 months of average blood glucose in elevated compared to last lab 4 months ago. I recommend adding a medication called Farxiga  5 mg, once a day to your current treatment to help improve your blood glucose. Your cholesterol panel shows increase in bad cholesterol or LDL. I recommend incorporating fresh vegetables, high fiber food, cutting down on red meat, replacing butter with avocado/olive oil can help improve this. Any updates from the [place] on Tramadol ? Please let us  know if you are interested in starting Farxiga  and I will go ahead and send the prescription your way.    Take care,  Chrystle Murillo, MD"

## 2023-05-05 NOTE — Telephone Encounter (Signed)
 Pt notified. Pt in agreement with starting Farxiga . Pt also stated that he also has not heard back from [place] yet about his Tramadol . [facility]

## 2023-05-08 ENCOUNTER — Other Ambulatory Visit: Payer: Self-pay

## 2023-05-08 ENCOUNTER — Emergency Department
Admission: EM | Admit: 2023-05-08 | Discharge: 2023-05-08 | Disposition: A | Discharge status: Home / Self Care | Attending: Emergency Medicine | Admitting: Emergency Medicine

## 2023-05-08 ENCOUNTER — Emergency Department

## 2023-05-08 DIAGNOSIS — I10 Essential (primary) hypertension: Secondary | ICD-10-CM | POA: Diagnosis not present

## 2023-05-08 DIAGNOSIS — S161XXA Strain of muscle, fascia and tendon at neck level, initial encounter: Secondary | ICD-10-CM | POA: Diagnosis not present

## 2023-05-08 DIAGNOSIS — S0990XA Unspecified injury of head, initial encounter: Secondary | ICD-10-CM | POA: Diagnosis not present

## 2023-05-08 DIAGNOSIS — S199XXA Unspecified injury of neck, initial encounter: Secondary | ICD-10-CM | POA: Diagnosis not present

## 2023-05-08 DIAGNOSIS — Z8673 Personal history of transient ischemic attack (TIA), and cerebral infarction without residual deficits: Secondary | ICD-10-CM | POA: Insufficient documentation

## 2023-05-08 DIAGNOSIS — E119 Type 2 diabetes mellitus without complications: Secondary | ICD-10-CM | POA: Diagnosis not present

## 2023-05-08 DIAGNOSIS — Y92410 Unspecified street and highway as the place of occurrence of the external cause: Secondary | ICD-10-CM | POA: Insufficient documentation

## 2023-05-08 DIAGNOSIS — G20C Parkinsonism, unspecified: Secondary | ICD-10-CM | POA: Insufficient documentation

## 2023-05-08 DIAGNOSIS — Z981 Arthrodesis status: Secondary | ICD-10-CM | POA: Diagnosis not present

## 2023-05-08 DIAGNOSIS — M542 Cervicalgia: Secondary | ICD-10-CM | POA: Diagnosis present

## 2023-05-08 HISTORY — DX: Chronic obstructive pulmonary disease, unspecified: J44.9

## 2023-05-08 HISTORY — DX: Disorder of kidney and ureter, unspecified: N28.9

## 2023-05-08 MED ORDER — CARBIDOPA-LEVODOPA 25-100 MG PO TABS
1.0000 | ORAL_TABLET | Freq: Once | ORAL | Status: AC
  Administered 2023-05-08: 1 via ORAL
  Filled 2023-05-08: qty 1

## 2023-05-08 MED ORDER — TRAMADOL HCL 50 MG PO TABS
50.0000 mg | ORAL_TABLET | Freq: Once | ORAL | Status: AC
  Administered 2023-05-08: 50 mg via ORAL
  Filled 2023-05-08: qty 1

## 2023-05-08 NOTE — ED Notes (Signed)
Patient ambulated to and from hallway bathroom with a steady gait. 
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
## 2023-05-08 NOTE — Discharge Instructions (Signed)
 Follow-up with your neurosurgeon.  Please call him for an appointment.  I would like for you to be seen early next week for recheck with them.  Your CT today does not show any acute disruption of your hardware, no fracture.  Take your regular medications as prescribed.  Apply ice to the back of the neck.

## 2023-05-14 ENCOUNTER — Telehealth: Payer: Self-pay

## 2023-05-14 NOTE — Telephone Encounter (Signed)
 Please call the patient to see how he is doing since his discharge from ER on the 05/08/23. Offer him office visit if he has any concerns or questions.   Thank you,  Jacklin Mascot, MD

## 2023-05-14 NOTE — Telephone Encounter (Signed)
 Noted.  Walter Mascot, MD

## 2023-06-03 ENCOUNTER — Ambulatory Visit: Payer: Medicare Other | Admitting: Cardiovascular Disease

## 2023-07-15 ENCOUNTER — Other Ambulatory Visit: Payer: Self-pay

## 2023-08-03 ENCOUNTER — Ambulatory Visit (INDEPENDENT_AMBULATORY_CARE_PROVIDER_SITE_OTHER)

## 2023-08-03 VITALS — BP 130/60 | HR 69 | Temp 98.1°F | Ht 70.0 in | Wt 211.0 lb

## 2023-08-03 DIAGNOSIS — I7121 Aneurysm of the ascending aorta, without rupture: Secondary | ICD-10-CM

## 2023-08-03 DIAGNOSIS — K219 Gastro-esophageal reflux disease without esophagitis: Secondary | ICD-10-CM | POA: Diagnosis not present

## 2023-08-03 DIAGNOSIS — E785 Hyperlipidemia, unspecified: Secondary | ICD-10-CM

## 2023-08-03 DIAGNOSIS — I10 Essential (primary) hypertension: Secondary | ICD-10-CM

## 2023-08-03 DIAGNOSIS — F3341 Major depressive disorder, recurrent, in partial remission: Secondary | ICD-10-CM

## 2023-08-03 DIAGNOSIS — E1142 Type 2 diabetes mellitus with diabetic polyneuropathy: Secondary | ICD-10-CM

## 2023-08-03 DIAGNOSIS — B351 Tinea unguium: Secondary | ICD-10-CM

## 2023-08-03 DIAGNOSIS — E1169 Type 2 diabetes mellitus with other specified complication: Secondary | ICD-10-CM | POA: Diagnosis not present

## 2023-08-03 DIAGNOSIS — I5032 Chronic diastolic (congestive) heart failure: Secondary | ICD-10-CM

## 2023-08-03 DIAGNOSIS — I2510 Atherosclerotic heart disease of native coronary artery without angina pectoris: Secondary | ICD-10-CM

## 2023-08-03 DIAGNOSIS — M4322 Fusion of spine, cervical region: Secondary | ICD-10-CM | POA: Insufficient documentation

## 2023-08-03 DIAGNOSIS — M778 Other enthesopathies, not elsewhere classified: Secondary | ICD-10-CM

## 2023-08-03 DIAGNOSIS — E538 Deficiency of other specified B group vitamins: Secondary | ICD-10-CM

## 2023-08-03 DIAGNOSIS — R2689 Other abnormalities of gait and mobility: Secondary | ICD-10-CM | POA: Insufficient documentation

## 2023-08-03 DIAGNOSIS — M5001 Cervical disc disorder with myelopathy,  high cervical region: Secondary | ICD-10-CM | POA: Insufficient documentation

## 2023-08-03 DIAGNOSIS — E119 Type 2 diabetes mellitus without complications: Secondary | ICD-10-CM

## 2023-08-03 DIAGNOSIS — I700 Atherosclerosis of aorta: Secondary | ICD-10-CM | POA: Diagnosis not present

## 2023-08-03 DIAGNOSIS — M5412 Radiculopathy, cervical region: Secondary | ICD-10-CM

## 2023-08-03 NOTE — Assessment & Plan Note (Signed)
 Established with [facility] neurology, continue Lyrica  75 mg TID as recommended by neurology.

## 2023-08-03 NOTE — Assessment & Plan Note (Signed)
 Recent lab with normal B12, on B 12 supplement, 1000 mcg, continue.

## 2023-08-03 NOTE — Assessment & Plan Note (Signed)
 Established with cardiology. Last Ov was on 03/2023. Due to neck surgery he was recommended to f/u with cardiology in 3 months from his last OV. Patient is due for f/u appointment with them. His BP is within goal today, continue current medications Amlodipine  2.5 mg, Chlorthalidone  50 mg, Losartan  25 mg daily. We will obtain CMP.

## 2023-08-03 NOTE — Assessment & Plan Note (Signed)
 Asymptomatic.  Continue Vascepa  2 g daily, Lipitor 80 mg once daily, Zetia  10 Mg daily, aspirin  81 mg daily

## 2023-08-03 NOTE — Assessment & Plan Note (Signed)
 Stable on Omeprazole 20 mg daily. Continue.

## 2023-08-03 NOTE — Assessment & Plan Note (Signed)
 Involving right hand finger nails, if patient not able to see dermatology through [facility] within 2-3 weeks he will reach out to our clinic. I will start him on antifungal medication. He already has an appointment with dermatology through [place], he will update our clinic on when his appointment is. Given h/o diabetes this warrants treatment.
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
## 2023-08-03 NOTE — Assessment & Plan Note (Signed)
Continue risk factor management. 

## 2023-08-03 NOTE — Assessment & Plan Note (Signed)
 S/P C3-C7 PCDF at [facility] on 04/2023. [facility]

## 2023-08-03 NOTE — Patient Instructions (Signed)
 You qualify for tetanus booster, please check with your insurance or pharmacy to get it updated.   Please schedule lab only appointment within one week. Follow up with me in 6 months or sooner if you need me.   Please let our office know when your dermatology appointment is on. If you are not seeing them within next 2-3 weeks I will start you on treatment for fungal infection of right nails.

## 2023-08-03 NOTE — Assessment & Plan Note (Signed)
 Due for repeat A1c, lab ordered. Continue Metformin 1000 mg BID, Farxiga  5 mg daily. Will get urine microalbumin level as well.

## 2023-08-03 NOTE — Assessment & Plan Note (Signed)
 [facility]. Continue follow up.

## 2023-08-03 NOTE — Assessment & Plan Note (Signed)
 4.3 cm ascending thoracic aortic aneurysm evident on imaging on 12/17/2022 (CTA chest). Optimize BP, cholesterol, risk factors discussed with the patient. Continue f/u with cardiology, will be due for repeat imaging in 12/2024.

## 2023-08-03 NOTE — Assessment & Plan Note (Signed)
 Stable on Cymbalta  20 mg, continue.

## 2023-08-11 ENCOUNTER — Other Ambulatory Visit (INDEPENDENT_AMBULATORY_CARE_PROVIDER_SITE_OTHER)

## 2023-08-11 ENCOUNTER — Ambulatory Visit: Payer: Self-pay

## 2023-08-11 DIAGNOSIS — E119 Type 2 diabetes mellitus without complications: Secondary | ICD-10-CM | POA: Diagnosis not present

## 2023-08-11 DIAGNOSIS — I10 Essential (primary) hypertension: Secondary | ICD-10-CM

## 2023-08-11 DIAGNOSIS — E1169 Type 2 diabetes mellitus with other specified complication: Secondary | ICD-10-CM | POA: Diagnosis not present

## 2023-08-11 DIAGNOSIS — R809 Proteinuria, unspecified: Secondary | ICD-10-CM

## 2023-08-11 DIAGNOSIS — E785 Hyperlipidemia, unspecified: Secondary | ICD-10-CM | POA: Diagnosis not present

## 2023-08-11 DIAGNOSIS — E781 Pure hyperglyceridemia: Secondary | ICD-10-CM

## 2023-08-11 LAB — LIPID PANEL
Cholesterol: 260 mg/dL — ABNORMAL HIGH (ref 0–200)
HDL: 37.8 mg/dL — ABNORMAL LOW (ref >39.00)
NonHDL: 221.74
Total CHOL/HDL Ratio: 7
Triglycerides: 493 mg/dL — ABNORMAL HIGH (ref 0.0–149.0)
VLDL: 98.6 mg/dL — ABNORMAL HIGH (ref 0.0–40.0)

## 2023-08-11 LAB — COMPREHENSIVE METABOLIC PANEL WITH GFR
ALT: 18 U/L (ref 0–53)
AST: 15 U/L (ref 0–37)
Albumin: 4.9 g/dL (ref 3.5–5.2)
Alkaline Phosphatase: 46 U/L (ref 39–117)
BUN: 13 mg/dL (ref 6–23)
CO2: 28 meq/L (ref 19–32)
Calcium: 9.6 mg/dL (ref 8.4–10.5)
Chloride: 97 meq/L (ref 96–112)
Creatinine, Ser: 0.85 mg/dL (ref 0.40–1.50)
GFR: 88.51 mL/min (ref >60.00)
Glucose, Bld: 170 mg/dL — ABNORMAL HIGH (ref 70–99)
Potassium: 3.7 meq/L (ref 3.5–5.1)
Sodium: 138 meq/L (ref 135–145)
Total Bilirubin: 0.7 mg/dL (ref 0.2–1.2)
Total Protein: 7.3 g/dL (ref 6.0–8.3)

## 2023-08-11 LAB — LDL CHOLESTEROL, DIRECT: Direct LDL: 102 mg/dL

## 2023-08-11 LAB — MICROALBUMIN / CREATININE URINE RATIO
Creatinine,U: 63.5 mg/dL
Microalb Creat Ratio: 96.3 mg/g — ABNORMAL HIGH (ref 0.0–30.0)
Microalb, Ur: 6.1 mg/dL — ABNORMAL HIGH (ref 0.0–1.9)

## 2023-08-11 LAB — HEMOGLOBIN A1C: Hgb A1c MFr Bld: 8.1 % — ABNORMAL HIGH (ref 4.6–6.5)

## 2023-08-11 MED ORDER — DAPAGLIFLOZIN PROPANEDIOL 10 MG PO TABS: 10.0000 mg | ORAL_TABLET | Freq: Every day | ORAL | 1 refills | Status: AC

## 2023-08-11 MED ORDER — GLIPIZIDE ER 5 MG PO TB24: 5.0000 mg | ORAL_TABLET | Freq: Every day | ORAL | 1 refills | Status: DC

## 2023-08-11 MED ORDER — FENOFIBRATE 145 MG PO TABS: 145.0000 mg | ORAL_TABLET | Freq: Every day | ORAL | 1 refills | Status: DC

## 2023-08-11 NOTE — Telephone Encounter (Signed)
 1. Type 2 diabetes mellitus with diabetic microalbuminuria, without long-term current use of insulin  (HCC) (Primary) - dapagliflozin  propanediol (FARXIGA ) 10 MG TABS tablet; Take 1 tablet (10 mg total) by mouth daily before breakfast.  Dispense: 90 tablet; Refill: 1 - glipiZIDE  (GLUCOTROL  XL) 5 MG 24 hr tablet; Take 1 tablet (5 mg total) by mouth daily with breakfast.  Dispense: 90 tablet; Refill: 1 - HgB A1c; Future - Urine Microalbumin w/creat. ratio; Future - Comp Met (CMET); Future  2. High triglycerides - fenofibrate  (TRICOR ) 145 MG tablet; Take 1 tablet (145 mg total) by mouth daily.  Dispense: 90 tablet; Refill: 1 - Lipid panel; Future   Luke Shade, MD

## 2023-08-25 DIAGNOSIS — B351 Tinea unguium: Secondary | ICD-10-CM | POA: Diagnosis not present

## 2023-08-25 DIAGNOSIS — H61002 Unspecified perichondritis of left external ear: Secondary | ICD-10-CM | POA: Diagnosis not present

## 2023-10-05 ENCOUNTER — Ambulatory Visit: Attending: Nurse Practitioner | Admitting: Nurse Practitioner

## 2023-10-05 ENCOUNTER — Encounter: Payer: Self-pay | Admitting: Nurse Practitioner

## 2023-10-05 VITALS — BP 124/62 | HR 63 | Ht 70.0 in | Wt 216.0 lb

## 2023-10-05 DIAGNOSIS — I2510 Atherosclerotic heart disease of native coronary artery without angina pectoris: Secondary | ICD-10-CM

## 2023-10-05 DIAGNOSIS — I5032 Chronic diastolic (congestive) heart failure: Secondary | ICD-10-CM

## 2023-10-05 DIAGNOSIS — E119 Type 2 diabetes mellitus without complications: Secondary | ICD-10-CM

## 2023-10-05 DIAGNOSIS — I7121 Aneurysm of the ascending aorta, without rupture: Secondary | ICD-10-CM

## 2023-10-05 DIAGNOSIS — E785 Hyperlipidemia, unspecified: Secondary | ICD-10-CM | POA: Diagnosis not present

## 2023-10-05 DIAGNOSIS — E1169 Type 2 diabetes mellitus with other specified complication: Secondary | ICD-10-CM | POA: Diagnosis not present

## 2023-10-05 DIAGNOSIS — I4819 Other persistent atrial fibrillation: Secondary | ICD-10-CM | POA: Diagnosis not present

## 2023-10-05 DIAGNOSIS — I10 Essential (primary) hypertension: Secondary | ICD-10-CM

## 2023-10-05 DIAGNOSIS — I739 Peripheral vascular disease, unspecified: Secondary | ICD-10-CM

## 2023-10-05 NOTE — Patient Instructions (Addendum)
 Medication Instructions:  Your physician recommends that you continue on your current medications as directed. Please refer to the Current Medication list given to you today.  *If you need a refill on your cardiac medications before your next appointment, please call your pharmacy*  Lab Work: Your provider would like for you to have following labs drawn today TSH.   If you have labs (blood work) drawn today and your tests are completely normal, you will receive your results only by: MyChart Message (if you have MyChart) OR A paper copy in the mail If you have any lab test that is abnormal or we need to change your treatment, we will call you to review the results.  Testing/Procedures:  To be Scheduled After December 17, 2023  CT Angiography (CTA) chest/aorta, is a special type of CT scan that uses a computer to produce multi-dimensional views of major blood vessels throughout the body. In CT angiography, a contrast material is injected through an IV to help visualize the blood vessels  Nothing to eat or drink 4 hours prior to test  [facility] [address]  [place], [place] [address]   Follow-Up: At [facility], you and your health needs are our priority.  As part of our continuing mission to provide you with exceptional heart care, our providers are all part of one team.  This team includes your primary Cardiologist (physician) and Advanced Practice Providers or APPs (Physician Assistants and Nurse Practitioners) who all work together to provide you with the care you need, when you need it.  Your next appointment:   6 month(s)  Provider:   You may see Lonni Hanson, MD or one of the following Advanced Practice Providers on your designated Care Team:   Lonni Meager, NP   We recommend signing up for the patient portal called MyChart.  Sign up information is provided on this After Visit Summary.  MyChart is used to connect  with patients for Virtual Visits (Telemedicine).  Patients are able to view lab/test results, encounter notes, upcoming appointments, etc.  Non-urgent messages can be sent to your provider as well.   To learn more about what you can do with MyChart, go to [email or web address].
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
## 2023-10-06 ENCOUNTER — Ambulatory Visit: Payer: Self-pay | Admitting: Nurse Practitioner

## 2023-10-06 LAB — TSH: TSH: 0.801 u[IU]/mL (ref 0.450–4.500)

## 2023-10-13 ENCOUNTER — Encounter: Payer: Self-pay | Admitting: Pharmacist

## 2023-10-13 NOTE — Progress Notes (Signed)
 Pharmacy Quality Measure Review  Statin Therapy for Patients with Cardiovascular Disease (SPC)  Prescribed atorvastatin  80 mg.  Last year failed this measure 2/2 filling medication at the [facility]. Despite good adherence, will fail measure as it is calculated solely based on insurance claims. No further action at this time.

## 2023-10-15 ENCOUNTER — Other Ambulatory Visit: Payer: Self-pay

## 2023-11-02 DIAGNOSIS — M1711 Unilateral primary osteoarthritis, right knee: Secondary | ICD-10-CM | POA: Diagnosis not present

## 2023-11-03 ENCOUNTER — Other Ambulatory Visit (HOSPITAL_COMMUNITY): Payer: Self-pay

## 2023-11-04 ENCOUNTER — Other Ambulatory Visit (HOSPITAL_COMMUNITY): Payer: Self-pay

## 2023-11-15 ENCOUNTER — Other Ambulatory Visit: Payer: Self-pay | Admitting: Cardiology

## 2023-11-16 ENCOUNTER — Ambulatory Visit

## 2023-11-16 ENCOUNTER — Ambulatory Visit: Payer: Self-pay

## 2023-11-16 VITALS — BP 110/64 | HR 67 | Temp 98.7°F | Ht 70.0 in | Wt 214.8 lb

## 2023-11-16 DIAGNOSIS — Z7984 Long term (current) use of oral hypoglycemic drugs: Secondary | ICD-10-CM

## 2023-11-16 DIAGNOSIS — R809 Proteinuria, unspecified: Secondary | ICD-10-CM | POA: Diagnosis not present

## 2023-11-16 DIAGNOSIS — I7121 Aneurysm of the ascending aorta, without rupture: Secondary | ICD-10-CM | POA: Diagnosis not present

## 2023-11-16 DIAGNOSIS — E781 Pure hyperglyceridemia: Secondary | ICD-10-CM | POA: Diagnosis not present

## 2023-11-16 DIAGNOSIS — Z23 Encounter for immunization: Secondary | ICD-10-CM | POA: Diagnosis not present

## 2023-11-16 DIAGNOSIS — I4819 Other persistent atrial fibrillation: Secondary | ICD-10-CM

## 2023-11-16 DIAGNOSIS — E1129 Type 2 diabetes mellitus with other diabetic kidney complication: Secondary | ICD-10-CM

## 2023-11-16 DIAGNOSIS — M7581 Other shoulder lesions, right shoulder: Secondary | ICD-10-CM

## 2023-11-16 LAB — COMPREHENSIVE METABOLIC PANEL WITH GFR
ALT: 13 U/L (ref 0–53)
AST: 12 U/L (ref 0–37)
Albumin: 4.6 g/dL (ref 3.5–5.2)
Alkaline Phosphatase: 51 U/L (ref 39–117)
BUN: 20 mg/dL (ref 6–23)
CO2: 28 meq/L (ref 19–32)
Calcium: 9.4 mg/dL (ref 8.4–10.5)
Chloride: 95 meq/L — ABNORMAL LOW (ref 96–112)
Creatinine, Ser: 0.89 mg/dL (ref 0.40–1.50)
GFR: 87.12 mL/min (ref >60.00)
Glucose, Bld: 116 mg/dL — ABNORMAL HIGH (ref 70–99)
Potassium: 3.8 meq/L (ref 3.5–5.1)
Sodium: 134 meq/L — ABNORMAL LOW (ref 135–145)
Total Bilirubin: 0.9 mg/dL (ref 0.2–1.2)
Total Protein: 7.1 g/dL (ref 6.0–8.3)

## 2023-11-16 LAB — HEMOGLOBIN A1C: Hgb A1c MFr Bld: 7.1 % — ABNORMAL HIGH (ref 4.6–6.5)

## 2023-11-16 LAB — LIPID PANEL
Cholesterol: 233 mg/dL — ABNORMAL HIGH (ref 0–200)
HDL: 35.5 mg/dL — ABNORMAL LOW (ref >39.00)
NonHDL: 197.32
Total CHOL/HDL Ratio: 7
Triglycerides: 430 mg/dL — ABNORMAL HIGH (ref 0.0–149.0)
VLDL: 86 mg/dL — ABNORMAL HIGH (ref 0.0–40.0)

## 2023-11-16 LAB — LDL CHOLESTEROL, DIRECT: Direct LDL: 109 mg/dL

## 2023-11-16 LAB — MICROALBUMIN / CREATININE URINE RATIO
Creatinine,U: 69 mg/dL
Microalb Creat Ratio: 53.4 mg/g — ABNORMAL HIGH (ref 0.0–30.0)
Microalb, Ur: 3.7 mg/dL — ABNORMAL HIGH (ref 0.0–1.9)

## 2023-11-16 NOTE — Assessment & Plan Note (Addendum)
 Chronic, patient asymptomatic. Continue management per cardiology.

## 2023-11-16 NOTE — Assessment & Plan Note (Signed)
 Type 2 diabetes mellitus with multiple complications including diabetic kidney complication, peripheral neuropathy, hyperlipidemia.  Check A1c, urine microalbumin today.  No adverse effects to medications.  - Continue Farxiga  10 mg daily. - Continue Metformin 1000 mg BID. - Continue Glipizide  5 mg daily as prescribed. - Medication adjustment will be made based on lab results.  - UDT on diabetic eye exam per patient, last done in 09/2023 through [facility]. - Follow up in 6 months.  - Tetanus shot discussed. Declined COVID-19 vaccine and Medicare Wellness visit.  Orders:   Comp Met (CMET)   Urine Microalbumin w/creat. ratio   HgB A1c

## 2023-11-16 NOTE — Assessment & Plan Note (Signed)
 Continue follow up with neurology at [facility] for further management.

## 2023-11-16 NOTE — Assessment & Plan Note (Signed)
 Chronic mixed hyperlipidemia with elevated triglycerides.  Check lipid panel, CMP today.  Continue atorvastatin  80 mg daily, Zetia  10 mg daily, fenofibrate  145 mg daily, Vascepa  2 g daily.  Counseled on continuing with low carbohydrate diet, encouraged exercise as tolerated and safe. Orders:   Lipid panel

## 2023-11-16 NOTE — Assessment & Plan Note (Signed)
 [facility].  He already has physical therapy referral through [facility] and is waiting to start PT within next few days.  I recommend patient reach out to our clinic if he is not able to start physical therapy within a month.

## 2023-11-16 NOTE — Assessment & Plan Note (Addendum)
 CTA chest on 12/17/22: stable 4.3 cm ascending g thoracic aortic aneurysm. He is recommended to repeat CTA in 12/2023. Order is already in place from cardiology.

## 2023-11-16 NOTE — Progress Notes (Signed)
 Established Patient Office Visit   Subjective  Patient ID: Walter Salas, male    DOB: 12/22/1955  Age: 67 y.o. MRN: [id number]  No chief complaint on file.   Discussed the use of AI scribe software for clinical note transcription with the patient, who gave verbal consent to proceed.  History of Present Illness Discussed the use of AI scribe software for clinical note transcription with the patient, who gave verbal consent to proceed.  Walter Salas is a 68 year old male presenting for diabetes follow up.  [facility] through the [facility].  He has a history of chronic right shoulder pain and is waiting for starting physical therapy through the [facility].  Takes tramadol  50 mg twice a day as needed, Flexeril 10 mg twice daily as needed as prescribed by [facility] for cervical radiculopathy, right shoulder pain.  He takes duloxetine  20 mg daily and denies change in his mood.  No SI, HI.  He qualifies for annual Medicare wellness visit and declines at today.  During his visit patient was  found to have elevated  A1c at 8.1%, was found to have microalbuminuria. As a result he was started on Glipizide  5 mg once daily, Farxiga  increased from 5 mg to 10 mg daily.  He continues to take Metformin 1,000 mg twice a day. Home BG ranging in 120-150/fasting in the morning. He has changed his diet and is eating low carbohydrate food. No episode of low blood glucose. He had an eye exam in September or October 2025 at the [facility], with no concerns reported. He does not smoke and has reduced vaping significantly  He has Parkinson's disease and follows up with [facility]. Currently he is on carbidopa  levodopa  25-100 mg 3 times a day.  He also has cervical radiculopathy and is on pregabalin  75 mg 3 times a day.  He uses a cane for ambulation.  He denies worsening gait, tremors.  He has a history of GERD and takes omeprazole 20 mg daily.  He takes tamsulosin  0.4  mg daily and denies new urinary symptoms.  He is also on vitamin B12 over-the-counter supplement 1000 mcg daily.   Patient has significant cardiovascular history including coronary artery disease, mixed hyperlipidemia, ascending thoracic aneurysm, hypertension, persistent atrial fibrillation, chronic heart failure with preserved ejection fraction, peripheral artery disease.  He continues to follow-up with cardiology closely.  He gets annual CTA to follow-up on thoracic aneurysm.  Imaging has been stable and he is scheduled to get repeat imaging in December 2025. Current cardiac medication includes amiodarone  200 mg daily, amlodipine  2.5 mg daily, Eliquis 5 mg twice a day, aspirin  81 mg daily, atorvastatin  80 mg daily, chlorthalidone  50 mg daily, Zetia  10 mg daily, fenofibrate  145 mg daily, Lasix  20 mg as needed, Vascepa  2 g daily, losartan  25 mg daily, as needed nitroglycerin , potassium 20 mEq daily. Last cardiology appointment was in September 2025 and he is already scheduled to see cardiology in April 2026.  No dizziness, chest pain, or leg swelling. He reports sleeping well and has been limiting his use of pain medication to avoid interactions with his Parkinson's treatment. He occasionally uses nicotine but has reduced his usage significantly in the past few weeks.    ROS As per HPI    Objective:     BP 110/64 (BP Location: Right Arm, Patient Position: Sitting, Cuff Size: Normal)   Pulse 67   Temp 98.7 F (37.1 C) (  Oral)   Ht 5' 10 (1.778 m)   Wt 214 lb 12.8 oz (97.4 kg)   SpO2 94%   BMI 30.82 kg/m      11/16/2023    8:42 AM 08/03/2023    8:12 AM 05/04/2023    9:50 AM  Depression screen PHQ 2/9  Decreased Interest 1 3 3   Down, Depressed, Hopeless 0 0 0  PHQ - 2 Score 1 3 3   Altered sleeping 1 0 3  Tired, decreased energy 2 3 1   Change in appetite 0 0 1  Feeling bad or failure about yourself  0 0 0  Trouble concentrating 0 0 0  Moving slowly or fidgety/restless 0 0 0   Suicidal thoughts 0 0 0  PHQ-9 Score 4 6 8   Difficult doing work/chores Not difficult at all Somewhat difficult Not difficult at all      11/16/2023    8:42 AM 08/03/2023    8:12 AM 05/04/2023    9:50 AM 12/16/2022    3:34 PM  GAD 7 : Generalized Anxiety Score  Nervous, Anxious, on Edge 0 0 1 0  Control/stop worrying 0 0 0 0  Worry too much - different things 0 0 1 0  Trouble relaxing 0 0 1 0  Restless 0 0 1 0  Easily annoyed or irritable 0 0 0 0  Afraid - awful might happen 0 0 0 0  Total GAD 7 Score 0 0 4 0  Anxiety Difficulty Not difficult at all Not difficult at all Not difficult at all Not difficult at all      11/16/2023    8:42 AM 08/03/2023    8:12 AM 05/04/2023    9:50 AM  Depression screen PHQ 2/9  Decreased Interest 1 3 3   Down, Depressed, Hopeless 0 0 0  PHQ - 2 Score 1 3 3   Altered sleeping 1 0 3  Tired, decreased energy 2 3 1   Change in appetite 0 0 1  Feeling bad or failure about yourself  0 0 0  Trouble concentrating 0 0 0  Moving slowly or fidgety/restless 0 0 0  Suicidal thoughts 0 0 0  PHQ-9 Score 4 6 8   Difficult doing work/chores Not difficult at all Somewhat difficult Not difficult at all      11/16/2023    8:42 AM 08/03/2023    8:12 AM 05/04/2023    9:50 AM 12/16/2022    3:34 PM  GAD 7 : Generalized Anxiety Score  Nervous, Anxious, on Edge 0 0 1 0  Control/stop worrying 0 0 0 0  Worry too much - different things 0 0 1 0  Trouble relaxing 0 0 1 0  Restless 0 0 1 0  Easily annoyed or irritable 0 0 0 0  Afraid - awful might happen 0 0 0 0  Total GAD 7 Score 0 0 4 0  Anxiety Difficulty Not difficult at all Not difficult at all Not difficult at all Not difficult at all   SDOH Screenings   Food Insecurity: No Food Insecurity (11/13/2023)  Housing: Unknown (11/13/2023)  Transportation Needs: No Transportation Needs (11/13/2023)  Utilities: Not At Risk (11/02/2023)   Received from [facility]  Depression [phone]): Low Risk   (11/16/2023)  Financial Resource Strain: Medium Risk (11/13/2023)  Physical Activity: Inactive (11/13/2023)  Social Connections: Socially Isolated (11/13/2023)  Stress: No Stress Concern Present (11/13/2023)  Tobacco Use: Medium Risk (11/16/2023)     Physical Exam Constitutional:  General: He is not in acute distress.    Appearance: Normal appearance.  HENT:     Head: Normocephalic and atraumatic.     Right Ear: Tympanic membrane normal. There is no impacted cerumen.     Left Ear: Tympanic membrane normal. There is no impacted cerumen.     Mouth/Throat:     Mouth: Mucous membranes are moist.  Neck:     Thyroid : No thyroid  mass or thyroid  tenderness.  Cardiovascular:     Rate and Rhythm: Normal rate.  Pulmonary:     Effort: Pulmonary effort is normal.     Breath sounds: Normal breath sounds.  Abdominal:     General: Bowel sounds are normal.     Palpations: Abdomen is soft.     Tenderness: There is no abdominal tenderness. There is no guarding.  Musculoskeletal:     Cervical back: Neck supple. No rigidity.     Right lower leg: No edema.     Left lower leg: No edema.  Skin:    General: Skin is warm.  Neurological:     Mental Status: He is alert and oriented to person, place, and time.     Gait: Gait abnormal (slow, stooped gait.).  Psychiatric:        Mood and Affect: Mood normal.        Behavior: Behavior normal.        No results found for any visits on 11/16/23.  The ASCVD Risk score (Arnett DK, et al., 2019) failed to calculate for the following reasons:   Risk score cannot be calculated because patient has a medical history suggesting prior/existing ASCVD     Assessment & Plan:   Assessment & Plan Type 2 diabetes mellitus with diabetic microalbuminuria, without long-term current use of insulin  (HCC) Type 2 diabetes mellitus with multiple complications including diabetic kidney complication, peripheral neuropathy, hyperlipidemia.  Check A1c, urine  microalbumin today.  No adverse effects to medications.  - Continue Farxiga  10 mg daily. - Continue Metformin 1000 mg BID. - Continue Glipizide  5 mg daily as prescribed. - Medication adjustment will be made based on lab results.  - UDT on diabetic eye exam per patient, last done in 09/2023 through [facility]. - Follow up in 6 months.  - Tetanus shot discussed. Declined COVID-19 vaccine and Medicare Wellness visit.  Orders:   Comp Met (CMET)   Urine Microalbumin w/creat. ratio   HgB A1c  Needs flu shot Updated today.  Orders:   Flu vaccine HIGH DOSE PF(Fluzone Trivalent)  High triglycerides Chronic mixed hyperlipidemia with elevated triglycerides.  Check lipid panel, CMP today.  Continue atorvastatin  80 mg daily, Zetia  10 mg daily, fenofibrate  145 mg daily, Vascepa  2 g daily.  Counseled on continuing with low carbohydrate diet, encouraged exercise as tolerated and safe. Orders:   Lipid panel  Right [facility] tendonitis [facility].  He already has physical therapy referral through [facility] and is waiting to start PT within next few days.  I recommend patient reach out to our clinic if he is not able to start physical therapy within a month.     Aneurysm of ascending aorta without rupture CTA chest on 12/17/22: stable 4.3 cm ascending g thoracic aortic aneurysm. He is recommended to repeat CTA in 12/2023. Order is already in place from cardiology.      Persistent atrial fibrillation (HCC) Chronic, patient asymptomatic. Continue management per cardiology.       Return in about 6  months (around 05/15/2024) for Chronic follow up .   Luke Shade, MD

## 2023-11-16 NOTE — Patient Instructions (Signed)
 No medication change made during today's visit. Recommend follow up in 6 months wit Dr. Abbey.

## 2023-11-16 NOTE — Assessment & Plan Note (Signed)
 Updated today.  Orders:   Flu vaccine HIGH DOSE PF(Fluzone Trivalent)

## 2023-11-30 NOTE — Telephone Encounter (Signed)
 open in error

## 2023-12-07 ENCOUNTER — Other Ambulatory Visit: Payer: Self-pay

## 2023-12-07 DIAGNOSIS — E781 Pure hyperglyceridemia: Secondary | ICD-10-CM

## 2023-12-07 MED ORDER — ICOSAPENT ETHYL 1 G PO CAPS: 2.0000 g | ORAL_CAPSULE | Freq: Every day | ORAL | 3 refills | Status: AC

## 2023-12-29 ENCOUNTER — Telehealth: Payer: Self-pay

## 2023-12-29 DIAGNOSIS — I7121 Aneurysm of the ascending aorta, without rupture: Secondary | ICD-10-CM

## 2023-12-29 NOTE — Telephone Encounter (Signed)
 Can we please get more information on what orders he is referring to? Cardiology already has CT ordered and scheduled for 01/03/24.  Luke Shade, MD

## 2023-12-29 NOTE — Telephone Encounter (Signed)
 Copied from CRM #[id number]. Topic: Clinical - Request for Lab/Test Order >> Dec 29, 2023  3:08 PM Walter Salas wrote: Reason for CRM: Patient would like orders placed prior to upcoming scan on 01/03/24. Please contact patient to schedule.    RA#[id number] Patient states its also okay to relay information in his Mychart.

## 2023-12-30 NOTE — Addendum Note (Signed)
 Addended by: VIVIENNE LONNI SAUNDERS on: 12/30/2023 05:12 PM   Modules accepted: Orders

## 2023-12-31 ENCOUNTER — Other Ambulatory Visit: Payer: Self-pay | Admitting: Cardiology

## 2023-12-31 ENCOUNTER — Other Ambulatory Visit: Payer: Self-pay

## 2023-12-31 DIAGNOSIS — I7121 Aneurysm of the ascending aorta, without rupture: Secondary | ICD-10-CM

## 2024-01-01 ENCOUNTER — Ambulatory Visit: Payer: Self-pay | Admitting: Nurse Practitioner

## 2024-01-01 LAB — BASIC METABOLIC PANEL WITH GFR
BUN/Creatinine Ratio: 19 (ref 10–24)
BUN: 18 mg/dL (ref 8–27)
CO2: 22 mmol/L (ref 20–29)
Calcium: 10.6 mg/dL — ABNORMAL HIGH (ref 8.6–10.2)
Chloride: 98 mmol/L (ref 96–106)
Creatinine, Ser: 0.94 mg/dL (ref 0.76–1.27)
Glucose: 175 mg/dL — ABNORMAL HIGH (ref 70–99)
Potassium: 4.2 mmol/L (ref 3.5–5.2)
Sodium: 138 mmol/L (ref 134–144)
eGFR: 87 mL/min/1.73

## 2024-01-03 ENCOUNTER — Ambulatory Visit
Admission: RE | Admit: 2024-01-03 | Discharge: 2024-01-03 | Disposition: A | Discharge status: Home / Self Care | Source: Ambulatory Visit | Attending: Nurse Practitioner | Admitting: Nurse Practitioner

## 2024-01-03 DIAGNOSIS — I7121 Aneurysm of the ascending aorta, without rupture: Secondary | ICD-10-CM | POA: Insufficient documentation

## 2024-01-03 MED ORDER — IOHEXOL 350 MG/ML SOLN
100.0000 mL | Freq: Once | INTRAVENOUS | Status: AC | PRN
  Administered 2024-01-03: 100 mL via INTRAVENOUS

## 2024-01-17 NOTE — Progress Notes (Signed)
 "     Electrophysiology Clinic Note    Date:  01/18/2024  Patient ID:  Walter Salas 1955-02-09, MRN [id number] PCP:  Walter Bruckner, MD  Cardiologist:  Walter Hanson, MD  Cardiology APP:  Walter Walter Ingle, NP Walter Walter Ingle, NP  Electrophysiologist:  Walter Gladis Norton, MD  Electrophysiology APP:  Walter Gartner, NP    Discussed the use of AI scribe software for clinical note transcription with the patient, who gave verbal consent to proceed.   Patient Profile    Chief Complaint: AFib follow-up  History of Present Illness: Walter Salas is a 69 y.o. male with PMH notable for CAD s/p PCI, persis Afib, HTN, HLD, ascending aneurysm, CVA, traumatic ICH, OSA (intolerant of CPAP) ; seen today for Walter Gladis Norton, MD for routine electrophysiology followup.   Salas is s/p AF ablation w isolation of pulm veins, posterior wall on 09/2022 by Dr. Norton.  Salas last saw PA Walter Salas 01/2023 for routine 3 month post-AF ablation where Salas was doing well without recurrence of AFib. Salas had several surgical procedures planned, so recommended continuing amiodarone  until after spinal surgery, planned follow-up in 3-4 months.  Salas saw NP Walter 09/2023 where Salas was doing well.   On follow-up today, Salas is doing so so saying that Salas has been needing as needed Lasix  more often over the past couple months.  Salas does admit to dietary indiscretions around the holidays but says that his increased fluid predates this.  Salas was previously told to pulse Lasix  x 3 days when Salas noticed fluid increase or weight increase.  Today is day 2 of a 3-day pulse. Salas continues to take Eliquis twice a day without bleeding concerns.  Salas also continues to take amiodarone  daily.  Salas is not aware of any A-fib episodes since his ablation in 2024.  Salas denies chest pain, chest pressure.  Salas does have increased fatigue and less energy that Salas associates with fluid retention.  Salas continues to have good diet.  No lower  extremity edema, states Salas holds fluid in his abdomen.   Salas checks BP regularly, but is not able to recall specific readings.    Arrhythmia/Device History Amiodarone     ROS:  Please see the history of present illness. All other systems are reviewed and otherwise negative.    Physical Exam    VS:  BP (!) 140/62 (BP Location: Left Arm, Patient Position: Sitting, Cuff Size: Normal)   Pulse 76   Ht 5' 10 (1.778 m)   Wt 226 lb 6.4 oz (102.7 kg)   SpO2 96%   BMI 32.49 kg/m  BMI: Body mass index is 32.49 kg/m.    STOP-Bang Score:            Wt Readings from Last 3 Encounters:  01/18/24 226 lb 6.4 oz (102.7 kg)  11/16/23 214 lb 12.8 oz (97.4 kg)  10/05/23 216 lb (98 kg)      GEN- The patient is well appearing, alert and oriented x 3 today.   Lungs- diminished throughout, normal work of breathing.  Heart- Regular rate and rhythm, no murmurs, rubs or gallops Extremities- No peripheral edema, warm, dry   Studies Reviewed   Previous EP, cardiology notes.    EKG is ordered. Personal review of EKG from today shows:    EKG Interpretation Date/Time:  Tuesday January 18 2024 10:15:44 EST Ventricular Rate:  76 PR Interval:  242 QRS Duration:  84 QT Interval:  416 QTC Calculation:  468 R Axis:   42  Text Interpretation: Sinus rhythm with 1st degree A-V block Confirmed by Walter Salas [phone]) on 01/18/2024 10:30:26 AM     TTE, 09/11/2022  1. Left ventricular ejection fraction, by estimation, is 60 to 65%. The left ventricle has normal function. The left ventricle has no regional wall motion abnormalities. There is mild left ventricular hypertrophy. Left ventricular diastolic parameters are consistent with Grade II diastolic dysfunction (pseudonormalization).   2. Right ventricular systolic function is normal. The right ventricular size is normal. Tricuspid regurgitation signal is inadequate for assessing PA pressure.   3. Left atrial size was moderately dilated.   4. A small  pericardial effusion is present estimated 1.29 cm off the LV free wall. Effusion appears less off RV. There is no evidence of cardiac tamponade.   5. The mitral valve is normal in structure. No evidence of mitral valve regurgitation. No evidence of mitral stenosis.   6. The aortic valve appears tricuspid though not well visualized. Aortic valve regurgitation is not visualized. Aortic valve sclerosis is present, with no evidence of aortic valve stenosis. Aortic valve mean gradient measures 5.0 mmHg.   7. There is mild dilatation of the aortic root, measuring 43 mm. There is mild dilatation of the ascending aorta, measuring 42 mm.   8. The inferior vena cava is normal in size with greater than 50% respiratory variability, suggesting right atrial pressure of 3 mmHg.   TTE, 02/17/2022  1. Left ventricular ejection fraction, by estimation, is 55 to 60%. The left ventricle has normal function. The left ventricle has no regional wall motion abnormalities. There is mild asymmetric left ventricular hypertrophy of the basal-septal segment. Left ventricular diastolic parameters are consistent with Grade II diastolic dysfunction (pseudonormalization).   2. Right ventricular systolic function is normal. The right ventricular size is normal. Tricuspid regurgitation signal is inadequate for assessing PA pressure.   3. Left atrial size was mildly dilated.   4. The mitral valve is normal in structure. No evidence of mitral valve regurgitation. No evidence of mitral stenosis.   5. The aortic valve is normal in structure. Aortic valve regurgitation is not visualized. No aortic stenosis is present.   6. There is borderline dilatation of the aortic root, measuring 39 mm. There is mild dilatation of the ascending aorta, measuring 40 mm.   7. The inferior vena cava is normal in size with greater than 50% respiratory variability, suggesting right atrial pressure of 3 mmHg.   Assessment and Plan     #) persis Afib #)  amiodarone  monitoring S/p AF ablation 09/2022 by Dr. Inocencio Salas is not aware of any recurrence of Afib since ablation Walter stop amiodarone  today   #) Hypercoag d/t persis afib CHA2DS2-VASc Score = at least 6 [CHF History: 0, HTN History: 1, Diabetes History: 1, Stroke History: 2, Vascular Disease History: 1, Age Score: 1, Gender Score: 0].  Therefore, the patient's annual risk of stroke is 9.7 %.    Stroke ppx - 5mg  eliquis BID, appropriately dosed No bleeding concerns  #) HFpEF Weight is up by home scale and clinic scale with increased SOB/fatigue Start 25mg  spironolactone  daily, Walter stop potassium supplement Update BMP in about 2 weeks at PCP office Continue 20mg  lasix  PRN Encouraged him to weigh daily in the AM and record weights. Bring weight log to follow-up appts   #) HTN Slightly elevated during today's visit Continue 2.5mg  amlodipine , 50mg  chlorthalidone , 25mg  losartan  Encouraged him to check BP 2-3 times per week  and record measurements. Bring BP log to follow-up appts        Current medicines are reviewed at length with the patient today.   The patient has concerns regarding his medicines.  The following changes were made today:   START 25mg  spironolactone  STOP amiodarone   Labs/ tests ordered today include:  Orders Placed This Encounter  Procedures   EKG 12-Lead     Disposition: Follow up with Dr. Inocencio or EP APP in 6 months Follow-up with general cardiology in ~4 months as scheduled   Signed, Tanicia Wolaver, NP  01/18/2024  1:21 PM  Electrophysiology [facility] "

## 2024-01-18 ENCOUNTER — Encounter: Payer: Self-pay | Admitting: Cardiology

## 2024-01-18 ENCOUNTER — Ambulatory Visit: Attending: Cardiology | Admitting: Cardiology

## 2024-01-18 VITALS — BP 140/62 | HR 76 | Ht 70.0 in | Wt 226.4 lb

## 2024-01-18 DIAGNOSIS — I4819 Other persistent atrial fibrillation: Secondary | ICD-10-CM

## 2024-01-18 DIAGNOSIS — I5032 Chronic diastolic (congestive) heart failure: Secondary | ICD-10-CM | POA: Diagnosis not present

## 2024-01-18 DIAGNOSIS — I10 Essential (primary) hypertension: Secondary | ICD-10-CM

## 2024-01-18 DIAGNOSIS — D6869 Other thrombophilia: Secondary | ICD-10-CM | POA: Diagnosis not present

## 2024-01-18 MED ORDER — SPIRONOLACTONE 25 MG PO TABS: 25.0000 mg | ORAL_TABLET | Freq: Every day | ORAL | 3 refills | Status: AC

## 2024-01-18 NOTE — Patient Instructions (Signed)
 Medication Instructions:  Your physician recommends the following medication changes.  STOP TAKING: Amiodarone  Potassium Chloride   START TAKING: Spironolactone  25 mg once a day   *If you need a refill on your cardiac medications before your next appointment, please call your pharmacy*  Lab Work: No labs ordered today    Testing/Procedures: No test ordered today   Follow-Up: At [facility], you and your health needs are our priority.  As part of our continuing mission to provide you with exceptional heart care, our providers are all part of one team.  This team includes your primary Cardiologist (physician) and Advanced Practice Providers or APPs (Physician Assistants and Nurse Practitioners) who all work together to provide you with the care you need, when you need it.  Your next appointment:   6 month(s)  Provider:   Suzann Riddle, NP     Keep General Cardiology Appointment.  Daily Blood Pressure and Weight logs.

## 2024-01-20 ENCOUNTER — Ambulatory Visit: Payer: Self-pay

## 2024-01-20 LAB — OPHTHALMOLOGY REPORT-SCANNED

## 2024-01-20 NOTE — Progress Notes (Signed)
 Noted.  Walter Mascot, MD

## 2024-01-25 ENCOUNTER — Other Ambulatory Visit: Payer: Self-pay

## 2024-01-25 ENCOUNTER — Emergency Department

## 2024-01-25 ENCOUNTER — Emergency Department
Admission: EM | Admit: 2024-01-25 | Discharge: 2024-01-25 | Disposition: A | Discharge status: Home / Self Care | Attending: Emergency Medicine | Admitting: Emergency Medicine

## 2024-01-25 DIAGNOSIS — I110 Hypertensive heart disease with heart failure: Secondary | ICD-10-CM | POA: Diagnosis not present

## 2024-01-25 DIAGNOSIS — R197 Diarrhea, unspecified: Secondary | ICD-10-CM | POA: Diagnosis not present

## 2024-01-25 DIAGNOSIS — J101 Influenza due to other identified influenza virus with other respiratory manifestations: Secondary | ICD-10-CM | POA: Insufficient documentation

## 2024-01-25 DIAGNOSIS — E119 Type 2 diabetes mellitus without complications: Secondary | ICD-10-CM | POA: Diagnosis not present

## 2024-01-25 DIAGNOSIS — Z7901 Long term (current) use of anticoagulants: Secondary | ICD-10-CM | POA: Insufficient documentation

## 2024-01-25 DIAGNOSIS — G20A1 Parkinson's disease without dyskinesia, without mention of fluctuations: Secondary | ICD-10-CM | POA: Diagnosis not present

## 2024-01-25 DIAGNOSIS — I4891 Unspecified atrial fibrillation: Secondary | ICD-10-CM | POA: Diagnosis not present

## 2024-01-25 DIAGNOSIS — I5030 Unspecified diastolic (congestive) heart failure: Secondary | ICD-10-CM | POA: Insufficient documentation

## 2024-01-25 DIAGNOSIS — I2510 Atherosclerotic heart disease of native coronary artery without angina pectoris: Secondary | ICD-10-CM | POA: Insufficient documentation

## 2024-01-25 DIAGNOSIS — R059 Cough, unspecified: Secondary | ICD-10-CM | POA: Diagnosis present

## 2024-01-25 LAB — BASIC METABOLIC PANEL WITH GFR
Anion gap: 12 (ref 5–15)
BUN: 25 mg/dL — ABNORMAL HIGH (ref 8–23)
CO2: 26 mmol/L (ref 22–32)
Calcium: 9.4 mg/dL (ref 8.9–10.3)
Chloride: 95 mmol/L — ABNORMAL LOW (ref 98–111)
Creatinine, Ser: 1.07 mg/dL (ref 0.61–1.24)
GFR, Estimated: >60 mL/min
Glucose, Bld: 149 mg/dL — ABNORMAL HIGH (ref 70–99)
Potassium: 3.8 mmol/L (ref 3.5–5.1)
Sodium: 133 mmol/L — ABNORMAL LOW (ref 135–145)

## 2024-01-25 LAB — CBC
HCT: 41.4 % (ref 39.0–52.0)
Hemoglobin: 14 g/dL (ref 13.0–17.0)
MCH: 26.9 pg (ref 26.0–34.0)
MCHC: 33.8 g/dL (ref 30.0–36.0)
MCV: 79.5 fL — ABNORMAL LOW (ref 80.0–100.0)
Platelets: 177 K/uL (ref 150–400)
RBC: 5.21 MIL/uL (ref 4.22–5.81)
RDW: 15.9 % — ABNORMAL HIGH (ref 11.5–15.5)
WBC: 5.1 K/uL (ref 4.0–10.5)
nRBC: 0 % (ref 0.0–0.2)

## 2024-01-25 LAB — RESP PANEL BY RT-PCR (RSV, FLU A&B, COVID)  RVPGX2
Influenza A by PCR: POSITIVE — AB
Influenza B by PCR: NEGATIVE
Resp Syncytial Virus by PCR: NEGATIVE
SARS Coronavirus 2 by RT PCR: NEGATIVE

## 2024-01-25 LAB — PRO BRAIN NATRIURETIC PEPTIDE: Pro Brain Natriuretic Peptide: 162 pg/mL

## 2024-01-25 LAB — TROPONIN T, HIGH SENSITIVITY: Troponin T High Sensitivity: 16 ng/L (ref 0–19)

## 2024-01-25 MED ORDER — ALBUTEROL SULFATE HFA 108 (90 BASE) MCG/ACT IN AERS: 2.0000 | INHALATION_SPRAY | Freq: Four times a day (QID) | RESPIRATORY_TRACT | 0 refills | Status: DC | PRN

## 2024-01-25 MED ORDER — GUAIFENESIN ER 600 MG PO TB12: 600.0000 mg | ORAL_TABLET | Freq: Two times a day (BID) | ORAL | 0 refills | Status: AC

## 2024-01-25 NOTE — ED Notes (Signed)
 See triage note  Presents with some SOB   States this started a couple of days ago  Cough  Afebrile on arrival

## 2024-01-25 NOTE — ED Provider Notes (Signed)
 "  [facility] Provider Note    Event Date/Time   First MD Initiated Contact with Patient 01/25/24 1316     (approximate)   History   Chief Complaint Shortness of Breath   HPI  Walter Salas is a 69 y.o. male with past medical history of hypertension, diabetes, atrial fibrillation on Eliquis, HFpEF, CAD, and parkinsonism who presents to the ED complaining of shortness of breath.  Patient reports that he has been feeling ill for the past 3 days with nausea, vomiting, diarrhea, cough, and congestion.  He reports that he will feel short of breath at times and feels like there is fluid in his chest.  He denies any fevers and has not had any pain in his chest, also denies any swelling in his legs.  He is not aware of any sick contacts.     Physical Exam   Triage Vital Signs: ED Triage Vitals  Encounter Vitals Group     BP 01/25/24 1139 (!) 145/65     Girls Systolic BP Percentile --      Girls Diastolic BP Percentile --      Boys Systolic BP Percentile --      Boys Diastolic BP Percentile --      Pulse Rate 01/25/24 1139 83     Resp 01/25/24 1139 18     Temp 01/25/24 1139 98.7 F (37.1 C)     Temp Source 01/25/24 1139 Oral     SpO2 01/25/24 1139 94 %     Weight --      Height --      Head Circumference --      Peak Flow --      Pain Score 01/25/24 1140 7     Pain Loc --      Pain Education --      Exclude from Growth Chart --     Most recent vital signs: Vitals:   01/25/24 1139  BP: (!) 145/65  Pulse: 83  Resp: 18  Temp: 98.7 F (37.1 C)  SpO2: 94%    Constitutional: Alert and oriented. Eyes: Conjunctivae are normal. Head: Atraumatic. Nose: No congestion/rhinnorhea. Mouth/Throat: Mucous membranes are moist.  Cardiovascular: Normal rate, regular rhythm. Grossly normal heart sounds.  2+ radial pulses bilaterally. Respiratory: Normal respiratory effort.  No retractions. Lungs CTAB. Gastrointestinal: Soft and nontender. No  distention. Musculoskeletal: No lower extremity tenderness nor edema.  Neurologic:  Normal speech and language. No gross focal neurologic deficits are appreciated.    ED Results / Procedures / Treatments   Labs (all labs ordered are listed, but only abnormal results are displayed) Labs Reviewed  RESP PANEL BY RT-PCR (RSV, FLU A&B, COVID)  RVPGX2 - Abnormal; Notable for the following components:      Result Value   Influenza A by PCR POSITIVE (*)    All other components within normal limits  BASIC METABOLIC PANEL WITH GFR - Abnormal; Notable for the following components:   Sodium 133 (*)    Chloride 95 (*)    Glucose, Bld 149 (*)    BUN 25 (*)    All other components within normal limits  CBC - Abnormal; Notable for the following components:   MCV 79.5 (*)    RDW 15.9 (*)    All other components within normal limits  PRO BRAIN NATRIURETIC PEPTIDE  TROPONIN T, HIGH SENSITIVITY     EKG  ED ECG REPORT I, Carlin Palin, the attending physician, personally viewed and interpreted  this ECG.   Date: 01/25/2024  EKG Time: 11:40  Rate: 85  Rhythm: normal sinus rhythm  Axis: Normal  Intervals:first-degree A-V block  and prolonged QT  ST&T Change: None  RADIOLOGY Chest x-ray reviewed and interpreted by me with no infiltrate, edema, or effusion.  PROCEDURES:  Critical Care performed: No  Procedures   MEDICATIONS ORDERED IN ED: Medications - No data to display   IMPRESSION / MDM / ASSESSMENT AND PLAN / ED COURSE  I reviewed the triage vital signs and the nursing notes.                              69 y.o. male with past medical history of hypertension, diabetes, atrial fibrillation on Eliquis, HFpEF, CAD, and Parkinson disease who presents to the ED complaining of 4 days of nausea, vomiting, diarrhea, and cough with mild difficulty breathing.  Patient's presentation is most consistent with acute presentation with potential threat to life or bodily  function.  Differential diagnosis includes, but is not limited to, influenza, other viral illness, pneumonia, ACS, PE, CHF exacerbation.  Patient nontoxic-appearing and in no acute distress, vital signs are unremarkable and he is breathing comfortably on room air with oxygen saturations of 94%.  Lungs are clear to auscultation bilaterally and chest x-ray negative for acute finding.  He did test positive for influenza A, which seems to be the source of his symptoms.  EKG shows no evidence of arrhythmia or ischemia and troponin within normal limits, doubt ACS or PE.  Additional labs without significant anemia, leukocytosis, electrolyte abnormality, or AKI.  Patient appropriate for outpatient management, do not feel he would benefit from Tamiflu given it has been 3 days since the onset of symptoms.  We will treat symptomatically with albuterol  and Mucinex , he was counseled to follow-up with his PCP and otherwise return to the ED for new or worsening symptoms.  Patient agrees with plan.      FINAL CLINICAL IMPRESSION(S) / ED DIAGNOSES   Final diagnoses:  Influenza A     Rx / DC Orders   ED Discharge Orders          Ordered    albuterol  (VENTOLIN  HFA) 108 (90 Base) MCG/ACT inhaler  Every 6 hours PRN       Note to Pharmacy: Please supply with spacer   01/25/24 1333    guaiFENesin  (MUCINEX ) 600 MG 12 hr tablet  2 times daily        01/25/24 1333             Note:  This document was prepared using Dragon voice recognition software and may include unintentional dictation errors.   Willo Dunnings, MD 01/25/24 1335  "

## 2024-01-25 NOTE — ED Triage Notes (Signed)
 Pt presents to the ED via POV from home with [facility], cough, chest pain, and abdominal distension. Pt has a hx of heart failure. A&Ox4.

## 2024-02-03 ENCOUNTER — Ambulatory Visit: Payer: Self-pay

## 2024-02-03 ENCOUNTER — Ambulatory Visit
Admission: RE | Admit: 2024-02-03 | Discharge: 2024-02-03 | Disposition: A | Discharge status: Home / Self Care | Source: Ambulatory Visit

## 2024-02-03 ENCOUNTER — Ambulatory Visit

## 2024-02-03 VITALS — BP 140/58 | HR 87 | Temp 98.6°F | Ht 70.0 in | Wt 214.4 lb

## 2024-02-03 DIAGNOSIS — R051 Acute cough: Secondary | ICD-10-CM | POA: Diagnosis present

## 2024-02-03 DIAGNOSIS — J00 Acute nasopharyngitis [common cold]: Secondary | ICD-10-CM

## 2024-02-03 DIAGNOSIS — T148XXA Other injury of unspecified body region, initial encounter: Secondary | ICD-10-CM

## 2024-02-03 DIAGNOSIS — R052 Subacute cough: Secondary | ICD-10-CM | POA: Insufficient documentation

## 2024-02-03 MED ORDER — ALBUTEROL SULFATE HFA 108 (90 BASE) MCG/ACT IN AERS: 2.0000 | INHALATION_SPRAY | Freq: Four times a day (QID) | RESPIRATORY_TRACT | 0 refills | Status: AC | PRN

## 2024-02-03 MED ORDER — FLUTICASONE PROPIONATE 50 MCG/ACT NA SUSP: 2.0000 | Freq: Every day | NASAL | 6 refills | Status: AC

## 2024-02-03 MED ORDER — DM-GUAIFENESIN ER 30-600 MG PO TB12: 1.0000 | ORAL_TABLET | Freq: Two times a day (BID) | ORAL | 0 refills | Status: AC | PRN

## 2024-02-03 MED ORDER — PREDNISONE 20 MG PO TABS: 20.0000 mg | ORAL_TABLET | Freq: Every day | ORAL | 0 refills | Status: AC

## 2024-02-03 NOTE — Patient Instructions (Addendum)
 Take Mucinex  DM every 12 hours as needed for cough. Do not exceed 2 tablets in 24 hours. Hold off on other over the counter cough medication.  Use nasal flonase  2 puffs in each nostril daily for 10 days.  Use albuterol  inhaler every 6 hourly as needed for wheezing, cough.  If chest x-ray shows pneumonia you will need treatment with antibiotic. We will reach out to you with result.  If chest x-ray is negative for pneumonia, we will consider a short course of prednisone  40 mg daily for 5 days. I will wait for the chest x-ray to come back before sending prescription for Prednisone .  Ice the left lower abdomen area where you have bruising.  If you develop chest pain, shortness of breath, blood in stool, black tarry stool please go to ED right away.

## 2024-02-03 NOTE — Assessment & Plan Note (Addendum)
 Plan for acute cough Orders:   fluticasone  (FLONASE ) 50 MCG/ACT nasal spray; Place 2 sprays into both nostrils daily.

## 2024-02-03 NOTE — Assessment & Plan Note (Addendum)
 Persistent cough for about 2 weeks with sputum production. D/D viral URI with persistent cough, secondary bacterial infection. Ordered chest x-ray to evaluate for pneumonia. Prescribed Mucinex  DM twice daily as needed for cough. Continue albuterol  inhaler every six hours as needed for wheezing.  Refill sent. Prescribed nasal Flonase , two puffs in each nostril daily for ten days to help with rhinitis, postnasal drip. Advised to avoid other over-the-counter cough medications given his comorbidities. If chest x-ray shows pneumonia, will initiate antibiotic treatment. If chest x-ray is negative for pneumonia, will consider a short course of prednisone  40 mg daily for five days. Instructed to go to the hospital if fever, chest pain, shortness of breath, or black tarry stools develop. Orders:   dextromethorphan-guaiFENesin  (MUCINEX  DM) 30-600 MG 12hr tablet; Take 1 tablet by mouth 2 (two) times daily as needed for cough.   DG Chest 2 View; Future   albuterol  (VENTOLIN  HFA) 108 (90 Base) MCG/ACT inhaler; Inhale 2 puffs into the lungs every 6 (six) hours as needed for wheezing or shortness of breath.

## 2024-02-03 NOTE — Progress Notes (Signed)
 "  Acute office visit   Subjective  Patient ID: Walter Salas, male    DOB: 03/02/1955  Age: 69 y.o. MRN: [id number]  Chief Complaint  Patient presents with   Follow-up   Cough   Bleeding/Bruising    Discussed the use of AI scribe software for clinical note transcription with the patient, who gave verbal consent to proceed.  History of Present Illness [name] is a 69 year old male with atrial fibrillation who presents with persistent cough for about 2 weeks.  He was diagnosed with influenza A on 01/25/2024 for which he was seen in the ED.  He was treated with guaifenesin  and albuterol  inhaler.  Uses albuterol  inhaler every 6 hourly and has been taking guaifenesin  600 mg as needed daily for cough. Cough initially dry, now with sputum production.  He also experiences mucus production that 'feels like it gets about halfway up and then stops.    He has postnasal drip, with mucus accumulating at the back of his throat when lying down. No facial pain, pressure, change in stool color, or blood in stool. He denies swelling in his legs or chest pain.  Patient endorses loss of appetite, no weight gain.  He experiences occasional chills but no current fever. Initially, he had vomiting and abdominal pain, which resolved. No current vomiting or abdominal pain is reported.  He has a history of right shoulder pain, started physical therapy prior to influenza diagnosis. He sleeps on his left side due to shoulder pain, has noted bruising on left lower abdomen, hip since the cough started.  He also takes Apixaban (Eliquis) 5 mg twice a day for atrial fibrillation.   His other comorbidities includes type 2 diabetes, Parkinson's disease, GERD, hypertension, coronary artery disease, hyperlipidemia, HFpEF.    ROS As per HPI    Objective:     BP (!) 140/58 (BP Location: Right Arm, Patient Position: Sitting)   Pulse 87   Temp 98.6 F (37 C) (Oral)   Ht 5' 10 (1.778 m)   Wt 214 lb 6.4 oz  (97.3 kg)   SpO2 94%   BMI 30.76 kg/m      11/16/2023    8:42 AM 08/03/2023    8:12 AM 05/04/2023    9:50 AM  Depression screen PHQ 2/9  Decreased Interest 1 3 3   Down, Depressed, Hopeless 0 0 0  PHQ - 2 Score 1 3 3   Altered sleeping 1 0 3  Tired, decreased energy 2 3 1   Change in appetite 0 0 1  Feeling bad or failure about yourself  0 0 0  Trouble concentrating 0 0 0  Moving slowly or fidgety/restless 0 0 0  Suicidal thoughts 0 0 0  PHQ-9 Score 4  6  8    Difficult doing work/chores Not difficult at all Somewhat difficult Not difficult at all     Data saved with a previous flowsheet row definition      11/16/2023    8:42 AM 08/03/2023    8:12 AM 05/04/2023    9:50 AM 12/16/2022    3:34 PM  GAD 7 : Generalized Anxiety Score  Nervous, Anxious, on Edge 0  0  1  0   Control/stop worrying 0  0  0  0   Worry too much - different things 0  0  1  0   Trouble relaxing 0  0  1  0   Restless 0  0  1  0  Easily annoyed or irritable 0  0  0  0   Afraid - awful might happen 0  0  0  0   Total GAD 7 Score 0 0 4 0  Anxiety Difficulty Not difficult at all Not difficult at all Not difficult at all Not difficult at all     Data saved with a previous flowsheet row definition      11/16/2023    8:42 AM 08/03/2023    8:12 AM 05/04/2023    9:50 AM  Depression screen PHQ 2/9  Decreased Interest 1 3 3   Down, Depressed, Hopeless 0 0 0  PHQ - 2 Score 1 3 3   Altered sleeping 1 0 3  Tired, decreased energy 2 3 1   Change in appetite 0 0 1  Feeling bad or failure about yourself  0 0 0  Trouble concentrating 0 0 0  Moving slowly or fidgety/restless 0 0 0  Suicidal thoughts 0 0 0  PHQ-9 Score 4  6  8    Difficult doing work/chores Not difficult at all Somewhat difficult Not difficult at all     Data saved with a previous flowsheet row definition      11/16/2023    8:42 AM 08/03/2023    8:12 AM 05/04/2023    9:50 AM 12/16/2022    3:34 PM  GAD 7 : Generalized Anxiety Score  Nervous, Anxious,  on Edge 0  0  1  0   Control/stop worrying 0  0  0  0   Worry too much - different things 0  0  1  0   Trouble relaxing 0  0  1  0   Restless 0  0  1  0   Easily annoyed or irritable 0  0  0  0   Afraid - awful might happen 0  0  0  0   Total GAD 7 Score 0 0 4 0  Anxiety Difficulty Not difficult at all Not difficult at all Not difficult at all Not difficult at all     Data saved with a previous flowsheet row definition   SDOH Screenings   Food Insecurity: No Food Insecurity (11/13/2023)  Housing: Unknown (11/13/2023)  Transportation Needs: No Transportation Needs (11/13/2023)  Utilities: Not At Risk (11/02/2023)   Received from [facility]  Depression [phone]): Low Risk (11/16/2023)  Financial Resource Strain: Medium Risk (11/13/2023)  Physical Activity: Inactive (11/13/2023)  Social Connections: Socially Isolated (11/13/2023)  Stress: No Stress Concern Present (11/13/2023)  Tobacco Use: Medium Risk (02/03/2024)     Physical Exam Constitutional:      General: He is not in acute distress. HENT:     Head: Normocephalic and atraumatic.     Right Ear: Tympanic membrane normal. There is no impacted cerumen.     Left Ear: Tympanic membrane normal. There is no impacted cerumen.     Nose: Rhinorrhea (clear) present.     Mouth/Throat:     Pharynx: No oropharyngeal exudate.  Cardiovascular:     Rate and Rhythm: Normal rate.  Pulmonary:     Effort: Pulmonary effort is normal.     Breath sounds: Wheezing (bibasilar wheezing, faint. No crackles on auscultation) present. No rales.  Abdominal:     General: Bowel sounds are normal. There is no distension.     Palpations: Abdomen is soft. There is no mass.     Tenderness: There is no abdominal tenderness. There is no guarding or rebound.  Comments: Faint bruising noted on left lower quadrant, along left hip, dispersed.   Musculoskeletal:     Cervical back: Neck supple. No rigidity.     Right lower leg: No edema.     Left  lower leg: No edema.  Skin:    General: Skin is warm.     Findings: Bruising (Faint bruising noted on left lower quadrant, along left hip, dispersed.  Patient is a side sleeper, sleeps on his left side) present.  Neurological:     Mental Status: He is alert and oriented to person, place, and time.  Psychiatric:        Mood and Affect: Mood normal.        No results found for any visits on 02/03/24.  The ASCVD Risk score (Arnett DK, et al., 2019) failed to calculate for the following reasons:   Risk score cannot be calculated because patient has a medical history suggesting prior/existing ASCVD   * - Cholesterol units were assumed     Assessment & Plan:  Patient is a pleasant 69 year old male with multiple comorbidities including type 2 diabetes with complication, Parkinson's disease, HFpEF, mixed hyperlipidemia, hypertension, atrial fibrillation presenting for persistent cough after being diagnosed with influenza A. Assessment & Plan Acute cough Persistent cough for about 2 weeks with sputum production. D/D viral URI with persistent cough, secondary bacterial infection. Ordered chest x-ray to evaluate for pneumonia. Prescribed Mucinex  DM twice daily as needed for cough. Continue albuterol  inhaler every six hours as needed for wheezing.  Refill sent. Prescribed nasal Flonase , two puffs in each nostril daily for ten days to help with rhinitis, postnasal drip. Advised to avoid other over-the-counter cough medications given his comorbidities. If chest x-ray shows pneumonia, will initiate antibiotic treatment. If chest x-ray is negative for pneumonia, will consider a short course of prednisone  40 mg daily for five days. Instructed to go to the hospital if fever, chest pain, shortness of breath, or black tarry stools develop. Orders:   dextromethorphan-guaiFENesin  (MUCINEX  DM) 30-600 MG 12hr tablet; Take 1 tablet by mouth 2 (two) times daily as needed for cough.   DG Chest 2 View;  Future   albuterol  (VENTOLIN  HFA) 108 (90 Base) MCG/ACT inhaler; Inhale 2 puffs into the lungs every 6 (six) hours as needed for wheezing or shortness of breath.  Acute rhinitis Plan for acute cough Orders:   fluticasone  (FLONASE ) 50 MCG/ACT nasal spray; Place 2 sprays into both nostrils daily.  Bruising Involving left lower abdomen, faint bruising. Patient has been sleeping on left side due to h/o r shoulder pain. He is also on Apixaban 5 mg twice daily for atrial fibrillation.  Bruising without pain, experiencing.  Abdominal exam reassuring.  Vitals reviewed and reassuring.  Recommend supportive care with an ice pack, avoiding further pressure, trauma by using pillow when he lays on that side.  Recommend emergent evaluation if experiencing pain, blood in stool, black tarry stool, or new symptom occurs.    Return if symptoms worsen or fail to improve.   Luke Shade, MD "

## 2024-02-03 NOTE — Assessment & Plan Note (Signed)
 Involving left lower abdomen, faint bruising. Patient has been sleeping on left side due to h/o r shoulder pain. He is also on Apixaban 5 mg twice daily for atrial fibrillation.  Bruising without pain, experiencing.  Abdominal exam reassuring.  Vitals reviewed and reassuring.  Recommend supportive care with an ice pack, avoiding further pressure, trauma by using pillow when he lays on that side.  Recommend emergent evaluation if experiencing pain, blood in stool, black tarry stool, or new symptom occurs.

## 2024-02-07 ENCOUNTER — Other Ambulatory Visit: Payer: Self-pay

## 2024-02-07 DIAGNOSIS — E781 Pure hyperglyceridemia: Secondary | ICD-10-CM

## 2024-02-07 DIAGNOSIS — R809 Proteinuria, unspecified: Secondary | ICD-10-CM

## 2024-02-08 ENCOUNTER — Other Ambulatory Visit: Payer: Self-pay

## 2024-02-08 DIAGNOSIS — R051 Acute cough: Secondary | ICD-10-CM

## 2024-02-17 ENCOUNTER — Other Ambulatory Visit: Payer: Self-pay | Admitting: Internal Medicine

## 2024-04-14 ENCOUNTER — Ambulatory Visit: Admitting: Nurse Practitioner

## 2024-05-16 ENCOUNTER — Ambulatory Visit

## 2024-07-11 ENCOUNTER — Ambulatory Visit: Admitting: Cardiology
